# Patient Record
Sex: Female | Born: 1935 | Race: White | Hispanic: No | Smoking: Never smoker
Health system: Southern US, Community
[De-identification: ages and names within clinical notes are randomized; demographics above are authoritative.]

## PROBLEM LIST (undated history)

## (undated) DIAGNOSIS — Z972 Presence of dental prosthetic device (complete) (partial): Secondary | ICD-10-CM

## (undated) DIAGNOSIS — Z853 Personal history of malignant neoplasm of breast: Secondary | ICD-10-CM

## (undated) DIAGNOSIS — Z860101 Personal history of adenomatous and serrated colon polyps: Secondary | ICD-10-CM

## (undated) DIAGNOSIS — N3010 Interstitial cystitis (chronic) without hematuria: Secondary | ICD-10-CM

## (undated) DIAGNOSIS — Z87898 Personal history of other specified conditions: Secondary | ICD-10-CM

## (undated) DIAGNOSIS — I4510 Unspecified right bundle-branch block: Secondary | ICD-10-CM

## (undated) DIAGNOSIS — R011 Cardiac murmur, unspecified: Secondary | ICD-10-CM

## (undated) DIAGNOSIS — M1990 Unspecified osteoarthritis, unspecified site: Secondary | ICD-10-CM

## (undated) DIAGNOSIS — E785 Hyperlipidemia, unspecified: Secondary | ICD-10-CM

## (undated) DIAGNOSIS — Z86010 Personal history of colonic polyps: Secondary | ICD-10-CM

## (undated) DIAGNOSIS — R399 Unspecified symptoms and signs involving the genitourinary system: Secondary | ICD-10-CM

## (undated) DIAGNOSIS — Z973 Presence of spectacles and contact lenses: Secondary | ICD-10-CM

## (undated) DIAGNOSIS — R3989 Other symptoms and signs involving the genitourinary system: Secondary | ICD-10-CM

## (undated) DIAGNOSIS — IMO0001 Reserved for inherently not codable concepts without codable children: Secondary | ICD-10-CM

## (undated) DIAGNOSIS — K5790 Diverticulosis of intestine, part unspecified, without perforation or abscess without bleeding: Secondary | ICD-10-CM

## (undated) DIAGNOSIS — K5909 Other constipation: Secondary | ICD-10-CM

## (undated) DIAGNOSIS — M8580 Other specified disorders of bone density and structure, unspecified site: Secondary | ICD-10-CM

## (undated) HISTORY — DX: Cardiac murmur, unspecified: R01.1

## (undated) HISTORY — PX: OTHER SURGICAL HISTORY: SHX169

## (undated) HISTORY — DX: Other specified disorders of bone density and structure, unspecified site: M85.80

## (undated) HISTORY — PX: TUBAL LIGATION: SHX77

## (undated) HISTORY — PX: VARICOSE VEIN SURGERY: SHX832

## (undated) HISTORY — DX: Unspecified osteoarthritis, unspecified site: M19.90

## (undated) HISTORY — DX: Interstitial cystitis (chronic) without hematuria: N30.10

## (undated) HISTORY — DX: Diverticulosis of intestine, part unspecified, without perforation or abscess without bleeding: K57.90

## (undated) HISTORY — PX: ROTATOR CUFF REPAIR: SHX139

---

## 1958-09-21 HISTORY — PX: OTHER SURGICAL HISTORY: SHX169

## 1968-09-20 HISTORY — PX: VAGINAL HYSTERECTOMY: SUR661

## 1999-11-27 ENCOUNTER — Encounter (INDEPENDENT_AMBULATORY_CARE_PROVIDER_SITE_OTHER): Payer: Self-pay | Admitting: Specialist

## 1999-11-27 ENCOUNTER — Encounter: Admission: RE | Admit: 1999-11-27 | Discharge: 1999-11-27 | Payer: Self-pay | Admitting: Surgery

## 1999-11-27 ENCOUNTER — Encounter: Payer: Self-pay | Admitting: Surgery

## 1999-11-27 ENCOUNTER — Other Ambulatory Visit: Admission: RE | Admit: 1999-11-27 | Discharge: 1999-11-27 | Payer: Self-pay | Admitting: Surgery

## 1999-12-17 ENCOUNTER — Encounter: Admission: RE | Admit: 1999-12-17 | Discharge: 1999-12-17 | Payer: Self-pay | Admitting: Surgery

## 1999-12-17 ENCOUNTER — Encounter: Payer: Self-pay | Admitting: Surgery

## 1999-12-19 ENCOUNTER — Ambulatory Visit (HOSPITAL_BASED_OUTPATIENT_CLINIC_OR_DEPARTMENT_OTHER): Admission: RE | Admit: 1999-12-19 | Discharge: 1999-12-19 | Payer: Self-pay | Admitting: Surgery

## 1999-12-19 ENCOUNTER — Encounter (INDEPENDENT_AMBULATORY_CARE_PROVIDER_SITE_OTHER): Payer: Self-pay | Admitting: *Deleted

## 1999-12-19 ENCOUNTER — Encounter: Payer: Self-pay | Admitting: Surgery

## 1999-12-19 HISTORY — PX: OTHER SURGICAL HISTORY: SHX169

## 1999-12-31 ENCOUNTER — Encounter: Admission: RE | Admit: 1999-12-31 | Discharge: 2000-03-30 | Payer: Self-pay | Admitting: Radiation Oncology

## 2000-06-17 ENCOUNTER — Encounter: Payer: Self-pay | Admitting: *Deleted

## 2000-06-17 ENCOUNTER — Encounter: Admission: RE | Admit: 2000-06-17 | Discharge: 2000-06-17 | Payer: Self-pay | Admitting: *Deleted

## 2000-10-30 ENCOUNTER — Ambulatory Visit (HOSPITAL_COMMUNITY): Admission: RE | Admit: 2000-10-30 | Discharge: 2000-10-30 | Payer: Self-pay | Admitting: Internal Medicine

## 2000-10-30 ENCOUNTER — Encounter: Payer: Self-pay | Admitting: Internal Medicine

## 2000-11-19 ENCOUNTER — Ambulatory Visit (HOSPITAL_COMMUNITY): Admission: RE | Admit: 2000-11-19 | Discharge: 2000-11-19 | Payer: Self-pay | Admitting: Internal Medicine

## 2000-11-19 ENCOUNTER — Encounter: Payer: Self-pay | Admitting: Internal Medicine

## 2001-06-24 ENCOUNTER — Encounter: Payer: Self-pay | Admitting: Gastroenterology

## 2003-11-23 ENCOUNTER — Ambulatory Visit: Payer: Self-pay | Admitting: Gastroenterology

## 2003-12-06 ENCOUNTER — Ambulatory Visit: Payer: Self-pay | Admitting: Gastroenterology

## 2003-12-08 ENCOUNTER — Ambulatory Visit: Payer: Self-pay | Admitting: Internal Medicine

## 2003-12-13 ENCOUNTER — Ambulatory Visit: Payer: Self-pay | Admitting: Internal Medicine

## 2004-05-06 ENCOUNTER — Ambulatory Visit: Payer: Self-pay | Admitting: Family Medicine

## 2004-05-13 ENCOUNTER — Ambulatory Visit: Payer: Self-pay | Admitting: Family Medicine

## 2004-10-07 ENCOUNTER — Encounter: Admission: RE | Admit: 2004-10-07 | Discharge: 2004-10-07 | Payer: Self-pay | Admitting: Orthopaedic Surgery

## 2004-10-22 ENCOUNTER — Encounter: Admission: RE | Admit: 2004-10-22 | Discharge: 2004-10-22 | Payer: Self-pay | Admitting: Orthopaedic Surgery

## 2004-10-25 ENCOUNTER — Ambulatory Visit: Payer: Self-pay

## 2004-11-05 ENCOUNTER — Ambulatory Visit: Payer: Self-pay | Admitting: Family Medicine

## 2005-04-07 ENCOUNTER — Ambulatory Visit: Payer: Self-pay | Admitting: Internal Medicine

## 2005-04-15 ENCOUNTER — Ambulatory Visit: Payer: Self-pay | Admitting: Internal Medicine

## 2005-09-08 ENCOUNTER — Ambulatory Visit: Payer: Self-pay | Admitting: Internal Medicine

## 2005-12-19 ENCOUNTER — Ambulatory Visit: Payer: Self-pay | Admitting: Internal Medicine

## 2006-06-24 ENCOUNTER — Ambulatory Visit: Payer: Self-pay | Admitting: Internal Medicine

## 2006-06-24 DIAGNOSIS — R0602 Shortness of breath: Secondary | ICD-10-CM

## 2006-06-24 DIAGNOSIS — R55 Syncope and collapse: Secondary | ICD-10-CM

## 2006-06-24 DIAGNOSIS — R002 Palpitations: Secondary | ICD-10-CM

## 2006-06-24 DIAGNOSIS — R079 Chest pain, unspecified: Secondary | ICD-10-CM

## 2006-07-06 ENCOUNTER — Ambulatory Visit: Payer: Self-pay | Admitting: Cardiology

## 2006-07-14 ENCOUNTER — Telehealth (INDEPENDENT_AMBULATORY_CARE_PROVIDER_SITE_OTHER): Payer: Self-pay | Admitting: *Deleted

## 2006-07-14 ENCOUNTER — Ambulatory Visit: Payer: Self-pay | Admitting: Internal Medicine

## 2006-07-23 ENCOUNTER — Ambulatory Visit: Payer: Self-pay | Admitting: Cardiology

## 2006-07-23 LAB — CONVERTED CEMR LAB
Cholesterol: 189 mg/dL (ref 0–200)
HDL: 41.9 mg/dL (ref >39.0)
LDL Cholesterol: 130 mg/dL — ABNORMAL HIGH (ref 0–99)
Total CHOL/HDL Ratio: 4.5
Triglycerides: 88 mg/dL (ref 0–149)
VLDL: 18 mg/dL (ref 0–40)

## 2006-12-18 ENCOUNTER — Ambulatory Visit: Payer: Self-pay | Admitting: Internal Medicine

## 2006-12-18 DIAGNOSIS — R300 Dysuria: Secondary | ICD-10-CM | POA: Insufficient documentation

## 2006-12-18 LAB — CONVERTED CEMR LAB
Bilirubin Urine: NEGATIVE
Glucose, Urine, Semiquant: NEGATIVE
Ketones, urine, test strip: NEGATIVE
Nitrite: NEGATIVE
Protein, U semiquant: NEGATIVE
Specific Gravity, Urine: 1.02
Urobilinogen, UA: NEGATIVE
WBC Urine, dipstick: NEGATIVE
pH: 6

## 2007-01-15 ENCOUNTER — Ambulatory Visit: Payer: Self-pay | Admitting: Internal Medicine

## 2007-02-04 ENCOUNTER — Ambulatory Visit: Payer: Self-pay | Admitting: Internal Medicine

## 2007-02-04 ENCOUNTER — Telehealth (INDEPENDENT_AMBULATORY_CARE_PROVIDER_SITE_OTHER): Payer: Self-pay | Admitting: *Deleted

## 2007-02-04 LAB — CONVERTED CEMR LAB
Bilirubin Urine: NEGATIVE
Glucose, Urine, Semiquant: NEGATIVE
Ketones, urine, test strip: NEGATIVE
Nitrite: NEGATIVE
Protein, U semiquant: NEGATIVE
Specific Gravity, Urine: 1.02
Urobilinogen, UA: NEGATIVE
WBC Urine, dipstick: NEGATIVE
pH: 6

## 2007-02-05 ENCOUNTER — Encounter: Payer: Self-pay | Admitting: Internal Medicine

## 2007-02-05 LAB — CONVERTED CEMR LAB: Hgb A1c MFr Bld: 5.6 % (ref 4.6–6.0)

## 2007-03-25 ENCOUNTER — Encounter: Payer: Self-pay | Admitting: Internal Medicine

## 2007-05-21 ENCOUNTER — Telehealth: Payer: Self-pay | Admitting: Internal Medicine

## 2007-08-25 ENCOUNTER — Telehealth (INDEPENDENT_AMBULATORY_CARE_PROVIDER_SITE_OTHER): Payer: Self-pay | Admitting: *Deleted

## 2007-08-26 ENCOUNTER — Ambulatory Visit: Payer: Self-pay | Admitting: Internal Medicine

## 2007-08-26 DIAGNOSIS — E785 Hyperlipidemia, unspecified: Secondary | ICD-10-CM

## 2007-08-26 DIAGNOSIS — R109 Unspecified abdominal pain: Secondary | ICD-10-CM

## 2007-08-26 LAB — CONVERTED CEMR LAB
Bilirubin Urine: NEGATIVE
Blood in Urine, dipstick: NEGATIVE
Glucose, Urine, Semiquant: NEGATIVE
Ketones, urine, test strip: NEGATIVE
Nitrite: NEGATIVE
Protein, U semiquant: NEGATIVE
Specific Gravity, Urine: 1.01
Urobilinogen, UA: 0.2
pH: 7.5

## 2007-08-27 ENCOUNTER — Encounter: Payer: Self-pay | Admitting: Internal Medicine

## 2007-08-30 ENCOUNTER — Encounter (INDEPENDENT_AMBULATORY_CARE_PROVIDER_SITE_OTHER): Payer: Self-pay | Admitting: *Deleted

## 2007-09-04 LAB — CONVERTED CEMR LAB
ALT: 16 units/L (ref 0–35)
AST: 18 units/L (ref 0–37)
Albumin: 4 g/dL (ref 3.5–5.2)
Alkaline Phosphatase: 52 units/L (ref 39–117)
BUN: 14 mg/dL (ref 6–23)
Basophils Absolute: 0 10*3/uL (ref 0.0–0.1)
Basophils Relative: 1.2 % (ref 0.0–3.0)
Bilirubin, Direct: 0.1 mg/dL (ref 0.0–0.3)
Cholesterol: 212 mg/dL (ref 0–200)
Creatinine, Ser: 0.8 mg/dL (ref 0.4–1.2)
Direct LDL: 128.3 mg/dL
Eosinophils Absolute: 0.1 10*3/uL (ref 0.0–0.7)
Eosinophils Relative: 3.2 % (ref 0.0–5.0)
HCT: 38.3 % (ref 36.0–46.0)
HDL: 63.9 mg/dL (ref >39.0)
Hemoglobin: 13 g/dL (ref 12.0–15.0)
Lymphocytes Relative: 48 % — ABNORMAL HIGH (ref 12.0–46.0)
MCHC: 34.1 g/dL (ref 30.0–36.0)
MCV: 95.5 fL (ref 78.0–100.0)
Monocytes Absolute: 0.2 10*3/uL (ref 0.1–1.0)
Monocytes Relative: 6.8 % (ref 3.0–12.0)
Neutro Abs: 1.5 10*3/uL (ref 1.4–7.7)
Neutrophils Relative %: 40.8 % — ABNORMAL LOW (ref 43.0–77.0)
Platelets: 164 10*3/uL (ref 150–400)
Potassium: 4.3 meq/L (ref 3.5–5.1)
RBC: 4.01 M/uL (ref 3.87–5.11)
RDW: 12.4 % (ref 11.5–14.6)
Total Bilirubin: 0.9 mg/dL (ref 0.3–1.2)
Total CHOL/HDL Ratio: 3.3
Total Protein: 6.5 g/dL (ref 6.0–8.3)
Triglycerides: 59 mg/dL (ref 0–149)
VLDL: 12 mg/dL (ref 0–40)
WBC: 3.6 10*3/uL — ABNORMAL LOW (ref 4.5–10.5)

## 2007-09-06 ENCOUNTER — Encounter (INDEPENDENT_AMBULATORY_CARE_PROVIDER_SITE_OTHER): Payer: Self-pay | Admitting: *Deleted

## 2007-09-10 ENCOUNTER — Encounter: Payer: Self-pay | Admitting: Internal Medicine

## 2007-10-14 ENCOUNTER — Encounter: Payer: Self-pay | Admitting: Internal Medicine

## 2007-10-29 ENCOUNTER — Ambulatory Visit: Payer: Self-pay | Admitting: Internal Medicine

## 2007-11-01 ENCOUNTER — Encounter (INDEPENDENT_AMBULATORY_CARE_PROVIDER_SITE_OTHER): Payer: Self-pay | Admitting: *Deleted

## 2007-11-01 LAB — CONVERTED CEMR LAB
BUN: 12 mg/dL (ref 6–23)
Basophils Absolute: 0 10*3/uL (ref 0.0–0.1)
Basophils Relative: 0.3 % (ref 0.0–3.0)
Creatinine, Ser: 0.7 mg/dL (ref 0.4–1.2)
Eosinophils Absolute: 0.1 10*3/uL (ref 0.0–0.7)
Eosinophils Relative: 2.1 % (ref 0.0–5.0)
HCT: 39.2 % (ref 36.0–46.0)
Hemoglobin: 12.9 g/dL (ref 12.0–15.0)
Lymphocytes Relative: 43.6 % (ref 12.0–46.0)
MCHC: 33 g/dL (ref 30.0–36.0)
MCV: 95.6 fL (ref 78.0–100.0)
Monocytes Absolute: 0.2 10*3/uL (ref 0.1–1.0)
Monocytes Relative: 5.5 % (ref 3.0–12.0)
Neutro Abs: 2 10*3/uL (ref 1.4–7.7)
Neutrophils Relative %: 48.5 % (ref 43.0–77.0)
Platelets: 181 10*3/uL (ref 150–400)
RBC: 4.1 M/uL (ref 3.87–5.11)
RDW: 12.8 % (ref 11.5–14.6)
WBC: 4 10*3/uL — ABNORMAL LOW (ref 4.5–10.5)

## 2007-11-03 ENCOUNTER — Ambulatory Visit: Payer: Self-pay | Admitting: Cardiology

## 2007-11-04 ENCOUNTER — Encounter (INDEPENDENT_AMBULATORY_CARE_PROVIDER_SITE_OTHER): Payer: Self-pay | Admitting: *Deleted

## 2007-11-04 ENCOUNTER — Telehealth (INDEPENDENT_AMBULATORY_CARE_PROVIDER_SITE_OTHER): Payer: Self-pay | Admitting: *Deleted

## 2007-11-12 ENCOUNTER — Encounter (INDEPENDENT_AMBULATORY_CARE_PROVIDER_SITE_OTHER): Payer: Self-pay | Admitting: *Deleted

## 2007-11-12 ENCOUNTER — Ambulatory Visit: Payer: Self-pay | Admitting: Internal Medicine

## 2007-11-12 LAB — CONVERTED CEMR LAB
OCCULT 1: NEGATIVE
OCCULT 2: NEGATIVE
OCCULT 3: NEGATIVE

## 2007-12-13 ENCOUNTER — Ambulatory Visit: Payer: Self-pay | Admitting: Gastroenterology

## 2007-12-13 DIAGNOSIS — K5900 Constipation, unspecified: Secondary | ICD-10-CM | POA: Insufficient documentation

## 2007-12-13 DIAGNOSIS — Z86010 Personal history of colonic polyps: Secondary | ICD-10-CM

## 2007-12-22 ENCOUNTER — Encounter: Payer: Self-pay | Admitting: Gastroenterology

## 2007-12-22 ENCOUNTER — Ambulatory Visit: Payer: Self-pay | Admitting: Gastroenterology

## 2007-12-26 ENCOUNTER — Encounter: Payer: Self-pay | Admitting: Gastroenterology

## 2008-03-27 ENCOUNTER — Encounter: Payer: Self-pay | Admitting: Internal Medicine

## 2008-07-17 ENCOUNTER — Telehealth (INDEPENDENT_AMBULATORY_CARE_PROVIDER_SITE_OTHER): Payer: Self-pay | Admitting: *Deleted

## 2008-07-23 ENCOUNTER — Emergency Department (HOSPITAL_COMMUNITY): Admission: EM | Admit: 2008-07-23 | Discharge: 2008-07-24 | Payer: Self-pay | Admitting: Emergency Medicine

## 2008-08-01 ENCOUNTER — Ambulatory Visit: Payer: Self-pay | Admitting: Internal Medicine

## 2008-08-01 DIAGNOSIS — T148XXA Other injury of unspecified body region, initial encounter: Secondary | ICD-10-CM | POA: Insufficient documentation

## 2008-08-18 ENCOUNTER — Telehealth (INDEPENDENT_AMBULATORY_CARE_PROVIDER_SITE_OTHER): Payer: Self-pay | Admitting: *Deleted

## 2008-10-24 ENCOUNTER — Ambulatory Visit: Payer: Self-pay | Admitting: Internal Medicine

## 2009-02-06 ENCOUNTER — Ambulatory Visit (HOSPITAL_BASED_OUTPATIENT_CLINIC_OR_DEPARTMENT_OTHER): Admission: RE | Admit: 2009-02-06 | Discharge: 2009-02-06 | Payer: Self-pay | Admitting: Urology

## 2009-03-30 ENCOUNTER — Encounter: Payer: Self-pay | Admitting: Internal Medicine

## 2009-04-05 ENCOUNTER — Encounter: Payer: Self-pay | Admitting: Internal Medicine

## 2009-08-24 ENCOUNTER — Encounter: Payer: Self-pay | Admitting: Internal Medicine

## 2009-10-09 ENCOUNTER — Ambulatory Visit: Payer: Self-pay | Admitting: Internal Medicine

## 2009-10-09 DIAGNOSIS — M545 Low back pain: Secondary | ICD-10-CM

## 2009-10-09 DIAGNOSIS — M8580 Other specified disorders of bone density and structure, unspecified site: Secondary | ICD-10-CM

## 2009-10-09 DIAGNOSIS — N3010 Interstitial cystitis (chronic) without hematuria: Secondary | ICD-10-CM | POA: Insufficient documentation

## 2009-10-09 LAB — CONVERTED CEMR LAB
Bilirubin Urine: NEGATIVE
Glucose, Urine, Semiquant: NEGATIVE
Ketones, urine, test strip: NEGATIVE
Nitrite: NEGATIVE
Protein, U semiquant: >=300
Specific Gravity, Urine: 1.025
Urobilinogen, UA: 0.2
WBC Urine, dipstick: NEGATIVE
pH: 6

## 2009-10-10 ENCOUNTER — Encounter: Payer: Self-pay | Admitting: Internal Medicine

## 2009-10-10 LAB — CONVERTED CEMR LAB
Basophils Absolute: 0 10*3/uL (ref 0.0–0.1)
Basophils Relative: 1 % (ref 0.0–3.0)
Calcium: 10 mg/dL (ref 8.4–10.5)
Eosinophils Absolute: 0.1 10*3/uL (ref 0.0–0.7)
Eosinophils Relative: 2.3 % (ref 0.0–5.0)
HCT: 38.5 % (ref 36.0–46.0)
Hemoglobin: 13.1 g/dL (ref 12.0–15.0)
Lymphocytes Relative: 35.3 % (ref 12.0–46.0)
Lymphs Abs: 1.4 10*3/uL (ref 0.7–4.0)
MCHC: 34.2 g/dL (ref 30.0–36.0)
MCV: 97.9 fL (ref 78.0–100.0)
Monocytes Absolute: 0.2 10*3/uL (ref 0.1–1.0)
Monocytes Relative: 5.7 % (ref 3.0–12.0)
Neutro Abs: 2.3 10*3/uL (ref 1.4–7.7)
Neutrophils Relative %: 55.7 % (ref 43.0–77.0)
Platelets: 159 10*3/uL (ref 150.0–400.0)
RBC: 3.93 M/uL (ref 3.87–5.11)
RDW: 13.7 % (ref 11.5–14.6)
TSH: 0.88 microintl units/mL (ref 0.35–5.50)
Vit D, 25-Hydroxy: 29 ng/mL — ABNORMAL LOW (ref 30–89)
WBC: 4 10*3/uL — ABNORMAL LOW (ref 4.5–10.5)

## 2009-10-29 ENCOUNTER — Ambulatory Visit: Payer: Self-pay | Admitting: Internal Medicine

## 2009-11-30 ENCOUNTER — Encounter: Payer: Self-pay | Admitting: Internal Medicine

## 2009-12-04 ENCOUNTER — Ambulatory Visit (HOSPITAL_BASED_OUTPATIENT_CLINIC_OR_DEPARTMENT_OTHER): Admission: RE | Admit: 2009-12-04 | Discharge: 2009-12-04 | Payer: Self-pay | Admitting: Urology

## 2009-12-18 ENCOUNTER — Encounter: Payer: Self-pay | Admitting: Internal Medicine

## 2010-01-11 ENCOUNTER — Encounter: Payer: Self-pay | Admitting: Internal Medicine

## 2010-01-15 ENCOUNTER — Encounter: Payer: Self-pay | Admitting: Internal Medicine

## 2010-01-18 ENCOUNTER — Encounter: Payer: Self-pay | Admitting: Internal Medicine

## 2010-01-22 ENCOUNTER — Encounter: Payer: Self-pay | Admitting: Internal Medicine

## 2010-01-29 ENCOUNTER — Encounter: Payer: Self-pay | Admitting: Internal Medicine

## 2010-01-31 ENCOUNTER — Encounter: Payer: Self-pay | Admitting: Internal Medicine

## 2010-02-17 LAB — CONVERTED CEMR LAB
Basophils Absolute: 0 10*3/uL (ref 0.0–0.1)
Basophils Relative: 0.1 % (ref 0.0–1.0)
Calcium: 9.2 mg/dL (ref 8.4–10.5)
Eosinophils Absolute: 0.2 10*3/uL (ref 0.0–0.6)
Eosinophils Relative: 3.8 % (ref 0.0–5.0)
Free T4: 0.8 ng/dL (ref 0.6–1.6)
HCT: 37.5 % (ref 36.0–46.0)
Hemoglobin: 13 g/dL (ref 12.0–15.0)
Lymphocytes Relative: 48.4 % — ABNORMAL HIGH (ref 12.0–46.0)
MCHC: 34.5 g/dL (ref 30.0–36.0)
MCV: 95.3 fL (ref 78.0–100.0)
Magnesium: 2.2 mg/dL (ref 1.5–2.5)
Monocytes Absolute: 0.2 10*3/uL (ref 0.2–0.7)
Monocytes Relative: 6 % (ref 3.0–11.0)
Neutro Abs: 1.7 10*3/uL (ref 1.4–7.7)
Neutrophils Relative %: 41.7 % — ABNORMAL LOW (ref 43.0–77.0)
Platelets: 175 10*3/uL (ref 150–400)
Potassium: 3.8 meq/L (ref 3.5–5.1)
RBC: 3.94 M/uL (ref 3.87–5.11)
RDW: 12.2 % (ref 11.5–14.6)
T3, Free: 2.9 pg/mL (ref 2.3–4.2)
TSH: 1.01 microintl units/mL (ref 0.35–5.50)
WBC: 4.1 10*3/uL — ABNORMAL LOW (ref 4.5–10.5)

## 2010-02-19 NOTE — Miscellaneous (Signed)
Summary: BONE DENSITY  Clinical Lists Changes  Orders: Added new Test order of T-Bone Densitometry (77080) - Signed Added new Test order of T-Lumbar Vertebral Assessment (77082) - Signed 

## 2010-02-19 NOTE — Letter (Signed)
Summary: [facility]   Imported By: Lanelle Bal 05/02/2009 08:59:23  _____________________________________________________________________  External Attachment:    Type:   Image     Comment:   External Document

## 2010-02-19 NOTE — Assessment & Plan Note (Signed)
Summary: problem with kidney/cbs   Vital Signs:  Patient profile:   77 year old female Weight:      133.4 pounds BMI:     20.97 Temp:     98.1 degrees F oral Pulse rate:   60 / minute Resp:     17 per minute BP sitting:   108 / 70  (left arm) Cuff size:   large  Vitals Entered By: Shonna Chock CMA (October 09, 2009 9:45 AM) CC: Lower back pain x 2 weeks, Back pain   Primary Care Provider:  Marga Melnick MD  CC:  Lower back pain x 2 weeks and Back pain.  History of Present Illness: Back Pain      This is an 77 year old woman who presents with Back pain X 1 week.  The patient reports urinary retention and dysuria ( she has seen Dr McDiarmid; records not available  )She  denies fever, chills, weakness, loss of sensation, fecal incontinence, and urinary incontinence.  The pain is located in the mid low back.  The pain began at home, suddenly,  w/o injury or overuse.  The pain  does not radiate.  The pain has no exacerbating factors.The pain is made better by opioids Rxed by Urology for bladder pain from Interstial Cystitis.  Risk factors for serious underlying conditions include age >= 50 years. Colon polyp 2009; repeat due 2012. Last BMD 05/2007.  Allergies: 1)  ! Lescol 2)  ! Zocor 3)  ! Vicodin 4)  ! * Hormone  Past History:  Past Medical History: Breast cancer, hx of Hyperlipidemia Osteoporosis dysuria due to Interstial Cystitis palpitations syncope Diverticulosis Hemorrhoids Villous adenomoatous colon polyps,  06/2001  Review of Systems General:  Denies sweats; Weight down 20 # since GU symptoms began. GI:  Denies abdominal pain, bloody stools, and dark tarry stools. GU:  Denies abnormal vaginal bleeding, discharge, and hematuria. Derm:  Denies lesion(s) and rash.  Physical Exam  General:  Appears younger than age,in no acute distress; alert,appropriate and cooperative throughout examination Abdomen:  Bowel sounds positive,abdomen soft  but slightly tender   over bladder without masses, organomegaly or hernias noted. Aorta palpable w/o AAA Msk:  No pain to percussion. Lay down & sat up w/o help. Neg SLR Extremities:  No clubbing, cyanosis, edema. Minor OA hand changes Neurologic:  alert & oriented X3, strength normal in all extremities, gait (heel/toe)normal, and DTRs symmetrical and normal.   Skin:  Intact without suspicious lesions or rashes Cervical Nodes:  No lymphadenopathy noted Axillary Nodes:  No palpable lymphadenopathy Psych:  memory intact for recent and remote, normally interactive, and good eye contact.     Impression & Recommendations:  Problem # 1:  LOW BACK PAIN, ACUTE (ICD-724.2)  The following medications were removed from the medication list:    Bayer Low Strength 81 Mg Tbec (Aspirin) .Marland Kitchen... 1 by mouth once daily Her updated medication list for this problem includes:    Tylenol Arthritis Pain 650 Mg Cr-tabs (Acetaminophen) .Marland Kitchen... Prn    Percocet 5-325 Mg Tabs (Oxycodone-acetaminophen) .Marland Kitchen... 1 by mouth every 4-6 hours as needed    Cyclobenzaprine Hcl 5 Mg Tabs (Cyclobenzaprine hcl) .Marland Kitchen... 1 at bedtime as needed back pain  Orders: TLB-CBC Platelet - w/Differential (85025-CBCD) Prescription Created Electronically [phone])  Problem # 2:  OSTEOPOROSIS (ICD-733.00)  Orders: Radiology Referral (Radiology) Venipuncture [phone]) T-Vitamin D (25-Hydroxy) [phone]) TLB-TSH (Thyroid Stimulating Hormone) (84443-TSH) TLB-CBC Platelet - w/Differential (85025-CBCD) TLB-Calcium (82310-CA)  Problem # 3:  INTERSTITIAL CYSTITIS (ICD-595.1)  Problem # 4:  DYSURIA (ICD-788.1)  due to #1  Orders: TLB-CBC Platelet - w/Differential (85025-CBCD)  Complete Medication List: 1)  Gabapentin 100 Mg Caps (Gabapentin) .Marland Kitchen.. 1 q 8 hrs as needed pain 2)  Tylenol Arthritis Pain 650 Mg Cr-tabs (Acetaminophen) .... Prn 3)  Percocet 5-325 Mg Tabs (Oxycodone-acetaminophen) .Marland Kitchen.. 1 by mouth every 4-6 hours as needed 4)  Cyclobenzaprine Hcl 5 Mg  Tabs (Cyclobenzaprine hcl) .Marland Kitchen.. 1 at bedtime as needed back pain  Other Orders: UA Dipstick w/o Micro (manual) (11914) Specimen Handling (99000) T-Culture, Urine ([id number])  Patient Instructions: 1)  No lifting > 10 # until back pain has resolved. Prescriptions: GABAPENTIN 100 MG CAPS (GABAPENTIN) 1 q 8 hrs as needed pain  #30 x 5   Entered and Authorized by:   Marga Melnick MD   Signed by:   Marga Melnick MD on 10/09/2009   Method used:   Faxed to ...       [facility]  [address] [phone]* (retail)       [address]       [place], [place]  [address]       Ph: [phone] or [phone]       Fax: [phone]   RxID:   [id number] CYCLOBENZAPRINE HCL 5 MG TABS (CYCLOBENZAPRINE HCL) 1 at bedtime as needed back pain  #15 x 0   Entered and Authorized by:   Marga Melnick MD   Signed by:   Marga Melnick MD on 10/09/2009   Method used:   [facility]ed to ...       [facility] [facility] [address] [phone]* (retail)       [address]       [place], [place]  [address]       Ph: [phone] or [phone]       Fax: [phone]   RxID:   [id number]   Laboratory Results   Urine Tests    Routine Urinalysis   Color: straw Appearance: Clear Glucose: negative   (Normal Range: Negative) Bilirubin: negative   (Normal Range: Negative) Ketone: negative   (Normal Range: Negative) Spec. Gravity: 1.025   (Normal Range: 1.003-1.035) Blood: moderate   (Normal Range: Negative) pH: 6.0   (Normal Range: 5.0-8.0) Protein: >=300   (Normal Range: Negative) Urobilinogen: 0.2   (Normal Range: 0-1) Nitrite: negative   (Normal Range: Negative) Leukocyte Esterace: negative   (Normal Range: Negative)    Comments: sent for culture     Appended Document: problem with kidney/cbs   Appended Document: problem with kidney/cbs Flu Vaccine Consent Questions     Do you have a history of severe allergic reactions to this vaccine? no    Any  prior history of allergic reactions to egg and/or gelatin? no    Do you have a sensitivity to the preservative Thimersol? no    Do you have a past history of Guillan-Barre Syndrome? no    Do you currently have an acute febrile illness? no    Have you ever had a severe reaction to latex? no    Vaccine information given and explained to patient? yes    Are you currently pregnant? no    Lot Number:[id number]   Exp Date:07/20/2010   Site Given  Left Deltoid IM

## 2010-02-21 NOTE — Letter (Signed)
[facility]: [facility]   Imported By: Lanelle Bal 01/28/2010 13:14:30  _____________________________________________________________________  External Attachment:    Type:   Image     Comment:   External Document

## 2010-02-21 NOTE — Letter (Signed)
Summary: [facility]   Imported By: Lanelle Bal 01/29/2010 13:56:11  _____________________________________________________________________  External Attachment:    Type:   Image     Comment:   External Document

## 2010-02-21 NOTE — Letter (Signed)
Summary: [facility]  [facility]   Imported By: Lanelle Bal 01/28/2010 13:13:50  _____________________________________________________________________  External Attachment:    Type:   Image     Comment:   External Document

## 2010-02-21 NOTE — Letter (Signed)
Summary: [facility]   Imported By: Lanelle Bal 02/07/2010 11:35:03  _____________________________________________________________________  External Attachment:    Type:   Image     Comment:   External Document

## 2010-02-21 NOTE — Letter (Signed)
Summary: [facility]  [facility]   Imported By: Lanelle Bal 01/28/2010 13:16:06  _____________________________________________________________________  External Attachment:    Type:   Image     Comment:   External Document

## 2010-02-21 NOTE — Letter (Signed)
Summary: [facility]   Imported By: Lanelle Bal 01/28/2010 13:17:00  _____________________________________________________________________  External Attachment:    Type:   Image     Comment:   External Document

## 2010-02-21 NOTE — Letter (Signed)
Summary: [facility]   Imported By: Lanelle Bal 01/28/2010 13:15:20  _____________________________________________________________________  External Attachment:    Type:   Image     Comment:   External Document

## 2010-02-21 NOTE — Letter (Signed)
Summary: [facility]   Imported By: Lanelle Bal 02/01/2010 12:28:37  _____________________________________________________________________  External Attachment:    Type:   Image     Comment:   External Document

## 2010-02-22 ENCOUNTER — Encounter: Payer: Self-pay | Admitting: Internal Medicine

## 2010-02-27 NOTE — Letter (Signed)
Summary: [facility]   Imported By: Lanelle Bal 02/18/2010 08:54:41  _____________________________________________________________________  External Attachment:    Type:   Image     Comment:   External Document

## 2010-03-07 NOTE — Letter (Signed)
Summary: [facility]  [facility]   Imported By: Maryln Gottron 03/01/2010 09:40:19  _____________________________________________________________________  External Attachment:    Type:   Image     Comment:   External Document

## 2010-03-15 ENCOUNTER — Encounter: Payer: Self-pay | Admitting: Internal Medicine

## 2010-03-28 NOTE — Letter (Signed)
Summary: [facility]  [facility]   Imported By: Maryln Gottron 03/22/2010 09:43:15  _____________________________________________________________________  External Attachment:    Type:   Image     Comment:   External Document

## 2010-04-02 LAB — POCT HEMOGLOBIN-HEMACUE: Hemoglobin: 12.2 g/dL (ref 12.0–15.0)

## 2010-04-08 LAB — POCT HEMOGLOBIN-HEMACUE: Hemoglobin: 13.9 g/dL (ref 12.0–15.0)

## 2010-04-12 ENCOUNTER — Encounter: Payer: Self-pay | Admitting: Internal Medicine

## 2010-06-04 NOTE — Assessment & Plan Note (Signed)
[facility]                            CARDIOLOGY OFFICE NOTE   Evans, Olivia                      MRN:          [id number]  DATE:07/06/2006                            DOB:          [birth year]    REASON FOR REFERRAL:  Evaluate patient with palpitations and weakness.   HISTORY OF PRESENT ILLNESS:  The patient is a lovely 77 year old white  female with past cardiac workup that included an event monitor in 2006.  This demonstrated PVCs and PACs.  This was done preoperatively.  She has  not been particularly bothered by these palpitations since that time.  She did have a stress test in 2000, demonstrating no ischemia or  infarct.   She gets along very well.  She lives by herself and does all of her  chores.  She works in [employer].  She can be up on her feet for  hours at a time.  With her usual activity she denies any chest  discomfort, neck discomfort, arm discomfort, activity induced nausea,  vomiting, excessive diaphoresis.   In early June she felt very weak one Saturday.  She was having quite a  few palpitations.  She was describing some sustained runs that might  last for several seconds at a time.  She was weak throughout that day.  She did not have any presyncope or syncope and no other associated  symptoms.  By the next day this had resolved and she has since felt her  usual self.  Currently she denies any symptoms as described above and  has been back to her usual activity.  She did get labs that included  normal electrolytes and thyroid.  There was 24 hour Holter ordered, but  I do not have those results.  We will have to clarify with the patient  whether this was actually done.   PAST MEDICAL HISTORY:  1. Mild hyperlipidemia.  2. Breast cancer.   PAST SURGICAL HISTORY:  1. Varicose veins stripped.  2. Chest tumor resected in the 1960s.  3. Tubal ligation.  4. Partial hysterectomy.  5. Lumpectomy on the right.  6.  Rotator cuff surgery.   ALLERGIES:  __________ , PREMARIN, SIMVASTATIN, ACTONEL, GABAPENTIN.   MEDICATIONS:  1. Boniva.  2. Vitamin D.  3. Vear Clock' Caplets.  4. Fish oil.  5. Calcium.  6. Aspirin 81 mg daily.  7. Tylenol.  8. Darvocet.   SOCIAL HISTORY:  The patient is a widow, her husband was my patient.  She is retired.  She has 4 children.  She has never smoked and does not  drink alcohol.   FAMILY HISTORY:  Noncontributory for early coronary disease, though her  father died at 29 with multiple medical problems.   REVIEW OF SYSTEMS:  As stated in the HPI and positive for occasional leg  cramps and joint pains, positive for cough.  Negative for all other  systems.   PHYSICAL EXAMINATION:  The patient is in no distress.  Blood pressure  151/72, heart rate 65 and regular, weight 151 pounds, body mass index  25.  HEENT:  Eyelids unremarkable, pupils equally round and reactive to  light, fundi within normal limits, oral mucosa unremarkable.  NECK:  No jugular venous distention at 45 degrees, carotid upstroke  brisk and symmetrical, no bruits, no thyromegaly.  LYMPHATICS:  No cervical, axillary, inguinal adenopathy.  LUNGS:  Clear to auscultation bilaterally.  BACK:  No costovertebral angle tenderness.  CHEST:  Unremarkable.  HEART:  PMI not displaced or sustained, S1 and S2 within normal limits,  no S3, no S4, no clicks, no rubs, no murmurs.  ABDOMEN:  Flat, positive bowel sounds normal in frequency and pitch, no  bruits, no rebound, no guarding, no midline pulsatile mass, no  hepatomegaly, splenomegaly.  SKIN:  No rashes, no nodules.  EXTREMITIES:  With 2+ pulses throughout, no edema, no cyanosis, no  clubbing.  NEURO:  Oriented to person, place, and time; cranial nerves II-XII  grossly intact, motor grossly intact.   EKG sinus rhythm, rate 69, leftward axis, RSR prime V1 and V2, poor  anterior R wave progression, no acute ST wave changes.   ASSESSMENT/PLAN:  1.  Weakness and palpitations.  The patient had this earlier this month      but has had none in the last 2 weeks.  At this point I will look      for any further testing, clarify with the patient whether she had a      Holter placed.  If not, I would not order this as she is currently      not having symptoms.  The etiology of this is vague, though she has      had premature atrial contractions and premature ventricular      contractions in the past and may have simply been having paroxysms      of this.  I do not think any further cardiovascular testing is      warranted at this point, however, she will let me know if she has      any increasing symptoms or certainly if she has any near syncope or      syncopal spells.  2. Hypertension.  Blood pressure is slightly elevated.  It was at her      last visit with Dr. Alwyn Ren.  I have instructed her to get a blood      pressure cuff and keep a diary.  She can review this with Dr.      Alwyn Ren.  3. Dyslipidemia.  The patient is treating this with diet control and      we have reviewed this.  She      can follow with Dr. Alwyn Ren.  4. Followup will be as needed.     Rollene Rotunda, MD, [facility]  Electronically Signed    JH/MedQ  DD: 07/06/2006  DT: 07/07/2006  Job #: [id number]   cc:   Titus Dubin. Alwyn Ren, MD,FACP,FCCP

## 2010-06-07 NOTE — Op Note (Signed)
[place]  Patient:    Olivia Evans, Olivia Evans                        MRN: [id number] Proc. Date: 12/19/99 Adm. Date:  [id number] Attending:  Charlton Haws CC:         Titus Dubin. Alwyn Ren, M.D. [facility]   Operative Report  CCS [phone]  PREOPERATIVE DIAGNOSIS:  Carcinoma right breast, lower/outer quadrant.  POSTOPERATIVE DIAGNOSIS:  Carcinoma right breast, lower/outer quadrant.  OPERATION:  Needle-guided right partial mastectomy with sentinel node dissection.  SURGEON:  Currie Paris, M.D.  ANESTHESIA:  General.  CLINICAL HISTORY:  [name] is a 77 year old lady, who had a subtle area noted in her left breast on mammography.  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  She was scheduled for a needle-guided excision with sentinel node dissection.  DESCRIPTION OF PROCEDURE:  Patient had guidewires placed, but the lesion was barely visible and the guidewire placement was technically difficult.  Three guidewires were placed with a ______  wire being the closest to the lesion and lesion appearing to be just posterior to this.  She was also injected with radioactive isotope for sentinel node.  Patient brought to the operating room and after satisfactory general anesthesia had been obtained, the left breast was prepped and draped as a sterile field.  The three guidewires entered almost in the same entry point and tracked superiorly and laterally.  I made a curvilinear incision in the lower/outer quadrant of the breast starting at the guidewire site and extending superiorly and laterally basically tracking over the guidewire.  I raised skin flaps medially towards the nipple and laterally towards the edge of the breast.  I then divided the breast tissue just inferior to the guidewires so that I got down to the chest wall.  I then elevated the breast and subcutaneous tissue and up off of the chest wall going  superiorly until I thought I was beyond the tip of the ______  wire.  I went both medially towards the nipple and laterally a wide distance around the guidewire so that I had a wide excision around the guidewires.  This was sent for specimen mammography.  It was primarily done with the cautery.  Bleeders were electrocoagulated.  I injected some Marcaine to help with postoperative analgesia and put four clips to mark the limits of the excision.  Attention was then turned to the axilla.  I used the neoprobe to identify a hot area somewhat low axilla, fairly anteriorly.  I made a transverse incision and divided through the subcutaneous tissues.  A self-retaining retractor was placed and a little more dissection and using the neoprobe, I could palpate a small node and was able to grasp that with the Babcock and excise it.  It was hot by the neoprobe and was sent for touch preps.  Additional probing with the neoprobe showed a second node a little bit higher up and this was also removed.  It was very small, may be about 4 mm and also was hot.  With this removal, there was no other hot areas noted in the axilla.  The wound was checked for hemostasis, injected with Marcaine and closed with 3-0 Vicryl followed by 4-0 Monocryl subcuticular.  Attention was returned to the breast itself and the specimen radiograph did not clearly show a lesion, although at 7 mm and with a lot of it having been removed  by the core, was not clear whether anything was going to show up.  I was nevertheless concerned that we did not have the lesion, so I went ahead and put two Allis clamps on the superior edge of the remaining breast tissue and excised again a full-thickness area of breast tissue going about 3 cm further up the breast and again going down to chest wall since the lesion was fairly close the chest wall.  With this excised, I was then able to palpate a small nodule, which was not readily noticeable prior to  excising this tissue and it abutted up against the area that I had previously excised.  I put a suture through this to mark for the pathologist and sent it off to pathology as well.  The wound was irrigated, checked for hemostasis and a few marking clips to mark out the new superior border were placed.  I palpated the area carefully and could find no other lesions and then closed.  The breast did not appear to close with deep sutures because I thought that would perform a significant deformity, so I simply closed the skin with staples.  The pathologist reported that the sentinel nodes were negative by touch preps and I elected at this point to terminate the procedure.  Sterile dressings were applied.  The patient tolerated the procedure well.  There were no operative complications and all counts were correct. DD:  12/19/99 TD:  12/19/99 Job: [id number]

## 2010-07-19 ENCOUNTER — Encounter: Payer: Self-pay | Admitting: Cardiovascular Disease

## 2010-07-19 ENCOUNTER — Encounter (INDEPENDENT_AMBULATORY_CARE_PROVIDER_SITE_OTHER): Payer: Self-pay | Admitting: *Deleted

## 2010-08-20 ENCOUNTER — Encounter (INDEPENDENT_AMBULATORY_CARE_PROVIDER_SITE_OTHER): Payer: Self-pay | Admitting: Surgery

## 2010-10-01 ENCOUNTER — Ambulatory Visit (HOSPITAL_BASED_OUTPATIENT_CLINIC_OR_DEPARTMENT_OTHER)
Admission: RE | Admit: 2010-10-01 | Discharge: 2010-10-01 | Disposition: A | Discharge status: Home / Self Care | Payer: Medicare Other | Source: Ambulatory Visit | Attending: Urology | Admitting: Urology

## 2010-10-01 ENCOUNTER — Other Ambulatory Visit: Payer: Self-pay | Admitting: Urology

## 2010-10-01 DIAGNOSIS — Z01812 Encounter for preprocedural laboratory examination: Secondary | ICD-10-CM | POA: Insufficient documentation

## 2010-10-01 DIAGNOSIS — N3010 Interstitial cystitis (chronic) without hematuria: Secondary | ICD-10-CM | POA: Insufficient documentation

## 2010-10-01 HISTORY — PX: OTHER SURGICAL HISTORY: SHX169

## 2010-10-01 LAB — POCT HEMOGLOBIN-HEMACUE: Hemoglobin: 13.3 g/dL (ref 12.0–15.0)

## 2010-10-15 NOTE — Op Note (Signed)
  NAMESAORY, Olivia Evans               ACCOUNT NO.:  [id number]  MEDICAL RECORD NO.:  [id number]  LOCATION:                                 FACILITY:  PHYSICIAN:  Martina Sinner, MD DATE OF BIRTH:  [birth year]  DATE OF PROCEDURE:  10/01/2010 DATE OF DISCHARGE:                              OPERATIVE REPORT   PREOPERATIVE DIAGNOSIS:  Interstitial cystitis.  POSTOPERATIVE DIAGNOSIS:  Interstitial cystitis plus Hunner ulcers.  SURGERY:  Cystoscopy, bladder hydrodistention, bladder biopsy, bladder fulguration of Hunner ulcers.  The patient was prepped and draped in usual fashion.  I was going to do hydrodistention, hopefully therapeutic since she has ongoing pain.  She was prepped and draped in usual fashion.  On inspection of her bladder, she had bladder scarring grade 1/4 bladder trabeculation.  As soon as I started to  distend the bladder, she had glomerulations and some mild erythema.  I hydrodistended 275 mL for approximately 4 or 5 minutes.  It was obvious she had some Hunner ulcers.  The mucosa was split superficially causing the ulcer.  She had a lot of oozing.  There was no bladder perforation or injury.  After the hydrodistention, I took 2 superficial biopsies at approximately 7 o'clock in the floor of the bladder away from the ureteral orifice.  I biopsied the erythematous areas in question.  I fulgurated the biopsy sites, especially the margins.  There was no bladder perforation and the biopsies were not deep.  I then spent approximately 15 minutes fulgurating what I felt was a Hunner ulcer at 5 o'clock, 11 o'clock at the dome and the left lateral wall.  She had so much involvement.  I could have fulgurated more, but I had it to define an endpoint.  Gentle irrigation was utilized.  Biopsy sites were rechecked at the end of the case.  There was no bladder injury.  Bladder was emptied and the patient was taken to recovery room.  I did not instill any Marcaine.          ______________________________ Martina Sinner, MD     SAM/MEDQ  D:  10/01/2010  T:  10/01/2010  Job:  [id number]  Electronically Signed by Alfredo Martinez MD on 10/15/2010 09:48:40 AM

## 2011-01-21 DIAGNOSIS — K5790 Diverticulosis of intestine, part unspecified, without perforation or abscess without bleeding: Secondary | ICD-10-CM

## 2011-01-21 HISTORY — DX: Diverticulosis of intestine, part unspecified, without perforation or abscess without bleeding: K57.90

## 2011-01-27 ENCOUNTER — Ambulatory Visit (INDEPENDENT_AMBULATORY_CARE_PROVIDER_SITE_OTHER): Payer: Medicare Other

## 2011-01-27 DIAGNOSIS — Z23 Encounter for immunization: Secondary | ICD-10-CM

## 2011-02-10 ENCOUNTER — Encounter: Payer: Self-pay | Admitting: Gastroenterology

## 2011-03-26 ENCOUNTER — Encounter: Payer: Self-pay | Admitting: Gastroenterology

## 2011-03-26 ENCOUNTER — Telehealth: Payer: Self-pay | Admitting: Gastroenterology

## 2011-03-26 NOTE — Telephone Encounter (Signed)
Patient thought she was scheduled for April 26 she will keep the appt for 04/15/11.  Patient is scheduled for on office visit for 04/15/11 with Dr Russella Dar, she is enrolled in a study for interstitial cystitis in April and needs her colon prior to the start of the study.  I have scheduled her for 04/22/11 for a screening colon she will get her instructions at the office visit

## 2011-04-01 ENCOUNTER — Ambulatory Visit (INDEPENDENT_AMBULATORY_CARE_PROVIDER_SITE_OTHER): Payer: Medicare Other | Admitting: Internal Medicine

## 2011-04-01 ENCOUNTER — Encounter: Payer: Self-pay | Admitting: Internal Medicine

## 2011-04-01 VITALS — BP 124/78 | HR 74 | Temp 98.0°F | Resp 12 | Ht 66.0 in | Wt 128.0 lb

## 2011-04-01 DIAGNOSIS — M810 Age-related osteoporosis without current pathological fracture: Secondary | ICD-10-CM

## 2011-04-01 DIAGNOSIS — Z860100 Personal history of colon polyps, unspecified: Secondary | ICD-10-CM

## 2011-04-01 DIAGNOSIS — Z23 Encounter for immunization: Secondary | ICD-10-CM

## 2011-04-01 DIAGNOSIS — E785 Hyperlipidemia, unspecified: Secondary | ICD-10-CM

## 2011-04-01 DIAGNOSIS — Z0000 Encounter for general adult medical examination without abnormal findings: Secondary | ICD-10-CM

## 2011-04-01 LAB — BASIC METABOLIC PANEL
CO2: 31 mEq/L (ref 19–32)
Chloride: 101 mEq/L (ref 96–112)
GFR: 76.07 mL/min (ref >60.00)
Glucose, Bld: 90 mg/dL (ref 70–99)
Potassium: 3.8 mEq/L (ref 3.5–5.1)
Sodium: 138 mEq/L (ref 135–145)

## 2011-04-01 LAB — CBC WITH DIFFERENTIAL/PLATELET
Eosinophils Relative: 1.8 % (ref 0.0–5.0)
Lymphocytes Relative: 37.2 % (ref 12.0–46.0)
Monocytes Absolute: 0.2 10*3/uL (ref 0.1–1.0)
Monocytes Relative: 4.7 % (ref 3.0–12.0)
Neutrophils Relative %: 55.5 % (ref 43.0–77.0)
Platelets: 169 10*3/uL (ref 150.0–400.0)
RBC: 4.03 Mil/uL (ref 3.87–5.11)
WBC: 3.7 10*3/uL — ABNORMAL LOW (ref 4.5–10.5)

## 2011-04-01 LAB — HEPATIC FUNCTION PANEL
ALT: 16 U/L (ref 0–35)
AST: 20 U/L (ref 0–37)
Albumin: 4.2 g/dL (ref 3.5–5.2)
Alkaline Phosphatase: 57 U/L (ref 39–117)

## 2011-04-01 LAB — LIPID PANEL: VLDL: 15.8 mg/dL (ref 0.0–40.0)

## 2011-04-01 LAB — LDL CHOLESTEROL, DIRECT: Direct LDL: 104.5 mg/dL

## 2011-04-01 NOTE — Progress Notes (Signed)
Subjective:    Patient ID: Olivia Evans, female    DOB: 15-Nov-1926, 78 y.o.   MRN: [id number]  HPI Medicare Wellness Visit:  The following psychosocial & medical history were reviewed as required by Medicare.   Social history: caffeine: 1 cup coffee , alcohol:  none ,  tobacco use : never  & exercise : on occasion.   Home & personal  safety / fall risk: no issues, activities of daily living: no limitations , seatbelt use : yes , and smoke alarm employment : yes .  Power of Attorney/Living Will status : in place   Vision ( as recorded per Nurse) & Hearing  evaluation : see exam Orientation :orientation X3 , memory & recall :good,  math testing: good,and mood & affect : normal. Depression / anxiety: denied Travel history : [place] cruise , immunization status :Shingles & PNA vaccines needed , transfusion history: no, and preventive health surveillance ( colonoscopies, BMD , etc as per protocol/ [facility]): recalled for colonoscopy, Dental care:  As needed . Chart reviewed &  Updated. Active issues reviewed & addressed.       Review of Systems She is not on any medications for her dyslipidemia. She has received a recall notice as noted  for colonoscopy. She denies been explained weight loss, rectal bleeding, or frank melena. With the constipation she's noted some change in color of stool, specifically a darkening.  She is being actively followed by the urologist for her interstitial cystitis but is only  on pain medication at this time.     Objective:   Physical Exam Gen.: Healthy and well-nourished in appearance. Alert, appropriate and cooperative throughout exam.Appears younger than stated age  Head: Normocephalic without obvious abnormalities  Eyes: No corneal or conjunctival inflammation noted.  Extraocular motion intact. Vision grossly normal with lenses. Ears: External  ear exam reveals no significant lesions or deformities. Canals clear .TMs normal. Hearing is grossly normal  bilaterally. Nose: External nasal exam reveals no deformity or inflammation. Nasal mucosa are pink and moist. No lesions or exudates noted. Septum to L Mouth: Oral mucosa and oropharynx reveal no lesions or exudates. Teeth in good repair. Neck: No deformities, masses, or tenderness noted. Range of motion normal. Thyroid :grainy ; L > R lobe w/o enlargement Lungs: Normal respiratory effort; chest expands symmetrically. Lungs are clear to auscultation without rales, wheezes, or increased work of breathing. Heart: Normal rate and rhythm. Normal S1 and S2. No gallop, click, or rub. No murmur. Abdomen: Bowel sounds normal; abdomen soft and nontender. No masses, organomegaly or hernias noted. Genitalia: Dr Jennette Kettle  .                                                                                   Musculoskeletal/extremities: No deformity or scoliosis noted of  the thoracic or lumbar spine. No clubbing, cyanosis, edema, or deformity noted. Range of motion  excellent .Tone & strength  normal.Joints normal. Nail health  good. Vascular: Carotid, radial artery, dorsalis pedis and  posterior tibial pulses are full and equal. No bruits present. Neurologic: Alert and oriented x3. Deep tendon reflexes symmetrical and normal.  Skin: Intact without suspicious lesions or rashes. Lymph: No cervical, axillary lymphadenopathy present. Psych: Mood and affect are normal. Normally interactive                                                                                        Assessment & Plan:  #1 Medicare Wellness Exam; criteria met ; data entered #2 Problem List reviewed ; Assessment/ Recommendations made Plan: see Orders

## 2011-04-01 NOTE — Patient Instructions (Addendum)
Preventive Health Care: Exercise  30-45  minutes a day, 3-4 days a week. Walking is especially valuable in preventing Osteoporosis. Eat a low-fat diet with lots of fruits and vegetables, up to 7-9 servings per day.Consume less than 30 grams of sugar per day from foods & drinks with High Fructose Corn Syrup as # 1,2,3 or #4 on label.  

## 2011-04-02 LAB — VITAMIN D 25 HYDROXY (VIT D DEFICIENCY, FRACTURES): Vit D, 25-Hydroxy: 18 ng/mL — ABNORMAL LOW (ref 30–89)

## 2011-04-15 ENCOUNTER — Ambulatory Visit (INDEPENDENT_AMBULATORY_CARE_PROVIDER_SITE_OTHER): Payer: Medicare Other | Admitting: Gastroenterology

## 2011-04-15 ENCOUNTER — Ambulatory Visit: Payer: Medicare Other | Admitting: Gastroenterology

## 2011-04-15 ENCOUNTER — Encounter: Payer: Self-pay | Admitting: Gastroenterology

## 2011-04-15 VITALS — BP 128/60 | HR 76 | Ht 66.0 in | Wt 132.8 lb

## 2011-04-15 DIAGNOSIS — K5900 Constipation, unspecified: Secondary | ICD-10-CM

## 2011-04-15 DIAGNOSIS — Z86010 Personal history of colonic polyps: Secondary | ICD-10-CM

## 2011-04-15 MED ORDER — MOVIPREP 100 G PO SOLR: 1.0000 | Freq: Once | ORAL | Status: DC

## 2011-04-15 NOTE — Patient Instructions (Signed)
You have been scheduled for a colonoscopy. Please follow written instructions given to you at your visit today.  Please pick up your prep kit at the pharmacy within the next 1-3 days. cc: Marga Melnick, MD

## 2011-04-15 NOTE — Progress Notes (Addendum)
History of Present Illness: This is an  78 year old female with a history of villous adenoma removed in 2003. Last colonoscopy was in November 2009 with no recurrent polyps. Her colon was tortuous, moderately difficult procedure. She has chronic constipation that is fairly well-controlled with a combination of MiraLax, milk of magnesia and Senokot as needed. She has problems with intestinal gas. Denies weight loss, abdominal pain, diarrhea, change in stool caliber, melena, hematochezia, nausea, vomiting, dysphagia, reflux symptoms, chest pain.  Review of Systems: Pertinent positive and negative review of systems were noted in the above HPI section. All other review of systems were otherwise negative.  Current Medications, Allergies, Past Medical History, Past Surgical History, Family History and Social History were reviewed in [employer] record.  Physical Exam: General: Well developed , well nourished, no acute distress Head: Normocephalic and atraumatic Eyes:  sclerae anicteric, EOMI Ears: Normal auditory acuity Mouth: No deformity or lesions Neck: Supple, no masses or thyromegaly Lungs: Clear throughout to auscultation Heart: Regular rate and rhythm; no murmurs, rubs or bruits Abdomen: Soft, non tender and non distended. No masses, hepatosplenomegaly or hernias noted. Normal Bowel sounds Rectal: Deferred to colonoscopy Musculoskeletal: Symmetrical with no gross deformities  Skin: No lesions on visible extremities Pulses:  Normal pulses noted Extremities: No clubbing, cyanosis, edema or deformities noted Neurological: Alert oriented x 4, grossly nonfocal Cervical Nodes:  No significant cervical adenopathy Inguinal Nodes: No significant inguinal adenopathy Psychological:  Alert and cooperative. Normal mood and affect  Assessment and Recommendations:  1. History of villous adenomatous colon polyp. Given that her health status is very good it is reasonable to  continue surveillance colonoscopies and she would like to proceed with another colonoscopy. The risks, benefits, and alternatives to colonoscopy with possible biopsy and possible polypectomy were discussed with the patient and they consent to proceed.   2. Chronic constipation with intestinal gas. I advised her to take MiraLax once or twice daily on a regularly scheduled basis and not as needed. She may use milk of magnesia and Senokot for constipation and gas remains refractory to MiraLax.

## 2011-04-16 ENCOUNTER — Encounter: Payer: Self-pay | Admitting: Gastroenterology

## 2011-04-20 ENCOUNTER — Emergency Department (HOSPITAL_COMMUNITY): Payer: Medicare Other

## 2011-04-20 ENCOUNTER — Encounter (HOSPITAL_COMMUNITY): Payer: Self-pay | Admitting: Emergency Medicine

## 2011-04-20 ENCOUNTER — Emergency Department (HOSPITAL_COMMUNITY)
Admission: EM | Admit: 2011-04-20 | Discharge: 2011-04-20 | Disposition: A | Discharge status: Home / Self Care | Payer: Medicare Other | Attending: Emergency Medicine | Admitting: Emergency Medicine

## 2011-04-20 DIAGNOSIS — R040 Epistaxis: Secondary | ICD-10-CM | POA: Insufficient documentation

## 2011-04-20 DIAGNOSIS — R209 Unspecified disturbances of skin sensation: Secondary | ICD-10-CM | POA: Insufficient documentation

## 2011-04-20 DIAGNOSIS — W010XXA Fall on same level from slipping, tripping and stumbling without subsequent striking against object, initial encounter: Secondary | ICD-10-CM | POA: Insufficient documentation

## 2011-04-20 DIAGNOSIS — Y92009 Unspecified place in unspecified non-institutional (private) residence as the place of occurrence of the external cause: Secondary | ICD-10-CM | POA: Insufficient documentation

## 2011-04-20 DIAGNOSIS — S43006A Unspecified dislocation of unspecified shoulder joint, initial encounter: Secondary | ICD-10-CM | POA: Insufficient documentation

## 2011-04-20 MED ORDER — PROPOFOL 10 MG/ML IV BOLUS
0.5000 mg/kg | Freq: Once | INTRAVENOUS | Status: AC
  Administered 2011-04-20: 60 mg via INTRAVENOUS

## 2011-04-20 MED ORDER — KETAMINE HCL 10 MG/ML IJ SOLN: 0.5000 mg/kg | Freq: Once | INTRAMUSCULAR | Status: DC

## 2011-04-20 MED ORDER — PROPOFOL 10 MG/ML IV EMUL
INTRAVENOUS | Status: AC
  Filled 2011-04-20: qty 20

## 2011-04-20 MED ORDER — KETAMINE HCL 10 MG/ML IJ SOLN
10.0000 mg | Freq: Once | INTRAMUSCULAR | Status: AC
  Administered 2011-04-20: 60 mg via INTRAVENOUS
  Filled 2011-04-20: qty 1

## 2011-04-20 NOTE — Discharge Instructions (Signed)
Shoulder Dislocation A dislocated shoulder means that the bones of the shoulder joint are out of place. The shoulder needs to be put back in place. It is held in place with a sling or shoulder immobilizer. It usually takes 4 to 6 weeks for the shoulder joint to heal completely. Follow up with your caregiver as directed. Do not use your arm until your caregiver approves. You may remove your sling or shoulder immobilizer to bathe, unless otherwise directed by your caregiver. Limit any movement of your arm while the sling or shoulder immobilizer is removed. You are at an increased risk of additional dislocations, especially if you move your shoulder too much before it is healed. A shoulder that has been dislocated several times may require surgery to tighten up the joint in order to prevent dislocations in the future. Nerves around the joint can also be damaged with this injury. This is not common. Watch for signs of nerve damage.  SEEK IMMEDIATE MEDICAL CARE IF: You have numbness or weakness in the injured arm or hand. Document Released: 01/06/2005 Document Revised: 12/26/2010 Document Reviewed: 06/18/2009 [facility] Patient Information 2012 [place], [place].

## 2011-04-20 NOTE — ED Notes (Signed)
PT. SLIPPED AND FELL AT HOME THIS MORNING , NO LOC , AMBULATORY , PRESENTS WITH RIGHT SHOULDER / DEFORMITY .

## 2011-04-20 NOTE — ED Notes (Addendum)
Shoulder back into place. Ortho paged to place sling

## 2011-04-20 NOTE — Progress Notes (Signed)
Orthopedic Tech Progress Note Patient Details:  Olivia Evans [birth year] [id number]  Other Ortho Devices Type of Ortho Device: Other (comment) Ortho Device Location: right shoulder Ortho Device Interventions: Application   Ardine Iacovelli T 04/20/2011, 8:54 AM

## 2011-04-20 NOTE — ED Provider Notes (Signed)
History     CSN: [id number]  Arrival date & time 04/20/11  [id number]   First MD Initiated Contact with Patient 04/20/11 [phone]      Chief Complaint  Patient presents with  . Fall    (Consider location/radiation/quality/duration/timing/severity/associated sxs/prior treatment) HPI  Patient is 78 yo lady with PMH of interstitial cystitis, breast cancer, osteoporosis, s/p of hysterectomy, who presents with a fall and right shoulder deformity and pain.  Patient is generally healthy and active at her baseline. At 5:15 AM, when she woke up and tried to get up from the bed, she accidentally slipped and fell at home, with right shoulder landing on the ground. She noticed right shoulder deformity and pain. Her shoulder pain is alleviated by holding her arms close to the chest and aggravated by movements to all direction. She has numbness in her right hand and fingers. She has a minor injury to her nose. Patient did not loss consciousness. She did not have palpitation and dizziness prior to fall. She got up by herself and called her daughter.   Past Medical History  Diagnosis Date  . Hyperlipidemia   . Interstitial cystitis     Dr McDiarmid  . Villous adenoma of colon 06/2001    Dr Russella Dar  . Breast cancer   . Osteoporosis   . Palpitations   . Diverticulosis   . Hemorrhoids     Past Surgical History  Procedure Date  . Abdominal hysterectomy     with appendectomy ; done for dysfunctional menses  . Breast surgery 2001    lumpectomy  . Colonoscopy with polypectomy 2009    Dr Russella Dar  . Cystoscopy w/ dilation of bladder      X 3  . Varicose vein surgery   . Tubal ligation   . Shoulder surgery     Family History  Problem Relation Age of Onset  . Heart disease Mother      CHF; no MI  . Cancer Sister     breast ; S/P bilateral mastectomy  . Breast cancer Sister   . Diabetes Maternal Grandmother   . Thyroid disease      3 daughters    History  Substance Use Topics  . Smoking status:  Never Smoker   . Smokeless tobacco: Never Used  . Alcohol Use: No    OB History    Grav Para Term Preterm Abortions TAB SAB Ect Mult Living                  Review of Systems  Constitutional: Negative for fever, chills and diaphoresis.  HENT: Positive for nosebleeds. Negative for hearing loss, ear pain, congestion, sore throat, sneezing, drooling, trouble swallowing, neck pain, neck stiffness and voice change.   Eyes: Negative for photophobia, pain, discharge and visual disturbance.  Respiratory: Negative for apnea, cough, choking, shortness of breath and stridor.   Cardiovascular: Negative for chest pain, palpitations and leg swelling.  Gastrointestinal: Negative for nausea, vomiting, abdominal pain, diarrhea, constipation, blood in stool and abdominal distention.  Genitourinary: Negative for dysuria, urgency, frequency and hematuria.  Musculoskeletal: Negative for back pain, joint swelling and gait problem.  Skin: Negative for rash.  Neurological: Positive for weakness and numbness. Negative for dizziness, tremors, seizures, facial asymmetry, light-headedness and headaches.       She has numbness and weakness in right hand.  Hematological: Negative for adenopathy.  Psychiatric/Behavioral: Negative for hallucinations, behavioral problems, confusion and agitation.   Allergies  Fluvastatin sodium; Hydrocodone-acetaminophen; and Simvastatin  Home Medications   Current Outpatient Rx  Name Route Sig Dispense Refill  . TYLENOL ARTHRITIS PAIN PO Oral Take 1 tablet by mouth daily as needed. For arthritis pain    . GABAPENTIN 100 MG PO CAPS Oral Take 100 mg by mouth daily as needed. For pain    . OXYCODONE-ACETAMINOPHEN 5-325 MG PO TABS Oral Take 1 tablet by mouth every 4 (four) hours as needed.    Marland Kitchen MAGNESIUM HYDROXIDE 400 MG/5ML PO SUSP Oral Take 5 mLs by mouth daily as needed. For constipation    . POLYETHYLENE GLYCOL 3350 PO POWD Oral Take 17 g by mouth daily as needed. For  constipation    . SENNOSIDES 8.6 MG PO TABS Oral Take 1 tablet by mouth daily as needed. For constipation      BP 127/63  Pulse 67  Temp(Src) 98.5 F (36.9 C) (Oral)  Resp 18  SpO2 100%  Physical Exam  General: resting in bed, not in acute distress HEENT: PERRL, EOMI, no scleral icterus Cardiac: S1/S2, RRR, No murmurs, gallops or rubs Pulm: Good air movement bilaterally, Clear to auscultation bilaterally, No rales, wheezing, rhonchi or rubs. Abd: Soft,  nondistended, nontender, no rebound pain, no organomegaly, BS present Ext: there is deformity in her right shoulder, tender to palpation. Her pulse is good in right arm. Right hand is warm. No rashes or edema.  Neuro: alert and oriented X3, cranial nerves II-XII grossly intact, muscle strength 5/5 in all extremities except for right hand (3/5).  sensation to light touch intact.    ED Course  Procedures (including critical care time)  Shoulder X-ray showed Anterior/inferior right shoulder dislocation. Patient received Ketamine and propofol for anesthesia. Then shoulder reduction procedure was successfully performed. After the procedure, repeated X-ray confirmed reduced right shoulder dislocation. Patient feels better. She will be discharged home.    Labs Reviewed - No data to display No results found.   No diagnosis found.    MDM          Lorretta Harp, MD 04/20/11 [phone]

## 2011-04-20 NOTE — ED Provider Notes (Signed)
I saw and evaluated the patient, reviewed the resident's note and I agree with the findings and plan.   .Face to face Exam:  General:  Awake HEENT:  Atraumatic Resp:  Normal effort Abd:  Nondistended Neuro:No focal weakness Lymph: No adenopathy I saw and evaluated the patient, reviewed the resident's note and I agree with the findings and plan.   .Face to face Exam:  General:  Awake HEENT:  Atraumatic Resp:  Normal effort Abd:  Nondistended Neuro:No focal weakness Lymph: No adenopathy I saw and evaluated the patient, reviewed the resident's note and I agree with the findings and plan.   .Face to face Exam:  General:  Awake HEENT:  Atraumatic Resp:  Normal effort Abd:  Nondistended Neuro:No focal weakness Lymph: No adenopathy   Closed reduction done with ketophol.  Post reduction film showed successful reduction    Nelia Shi, MD 04/20/11 [id number]

## 2011-04-20 NOTE — ED Notes (Signed)
Pt states that she was getting out of bed and she fell, pt did hit head but denies LOC. Pt states that she was not able to get up for about 30 min then she managaged to call her daughter who helped her get to car and here. Pt has an obvious deformity to her right shoulder. Pt able to wiggle fingers and sensation intact but pt feels the fingers are getting numb.

## 2011-04-21 ENCOUNTER — Encounter: Payer: Self-pay | Admitting: Gastroenterology

## 2011-04-22 ENCOUNTER — Encounter: Payer: Medicare Other | Admitting: Gastroenterology

## 2011-05-12 ENCOUNTER — Encounter: Payer: Self-pay | Admitting: Gastroenterology

## 2011-05-12 ENCOUNTER — Ambulatory Visit (AMBULATORY_SURGERY_CENTER): Payer: Medicare Other | Admitting: Gastroenterology

## 2011-05-12 VITALS — BP 175/87 | HR 87 | Temp 96.3°F | Resp 20 | Ht 66.0 in | Wt 132.0 lb

## 2011-05-12 DIAGNOSIS — Z1211 Encounter for screening for malignant neoplasm of colon: Secondary | ICD-10-CM

## 2011-05-12 DIAGNOSIS — K5900 Constipation, unspecified: Secondary | ICD-10-CM

## 2011-05-12 DIAGNOSIS — Z860100 Personal history of colon polyps, unspecified: Secondary | ICD-10-CM

## 2011-05-12 MED ORDER — SODIUM CHLORIDE 0.9 % IV SOLN: 500.0000 mL | INTRAVENOUS | Status: DC

## 2011-05-12 NOTE — Patient Instructions (Signed)

## 2011-05-12 NOTE — Op Note (Signed)
[facility] [address] [place], [place]  [address]  COLONOSCOPY PROCEDURE REPORT  PATIENT:  Olivia Evans, Olivia Evans  MR#:  [id number] BIRTHDATE:  07/30/1926, 85 yrs. old  GENDER:  female ENDOSCOPIST:  Judie Petit T. Russella Dar, MD, [facility]  PROCEDURE DATE:  05/12/2011 PROCEDURE:  Colonoscopy 65784 ASA CLASS:  Class II INDICATIONS:  1) surveillance and high-risk screening  2) history of pre-cancerous colon polyps: villous adenoma, 2003 MEDICATIONS:   These medications were titrated to patient response per physician's verbal order, Fentanyl 100 mcg IV, Versed 10 mg IV DESCRIPTION OF PROCEDURE:   After the risks benefits and alternatives of the procedure were thoroughly explained, informed consent was obtained.  Digital rectal exam was performed and revealed no abnormalities.  The LB [id number]-[id number] endoscope was introduced through the anus and advanced to the cecum, which was identified by both the appendix and ileocecal valve, with a tortuous colon, moderately difficult procedure.  The quality of the prep was excellent, using MoviPrep.  The instrument was then slowly withdrawn as the colon was fully examined. <<PROCEDUREIMAGES>> FINDINGS:  Moderate diverticulosis was found in the sigmoid colon. Otherwise normal colonoscopy without other polyps, masses, vascular ectasias, or inflammatory changes.   Retroflexed views in the rectum revealed no abnormalities.    The time to cecum =  5.5 minutes. The scope was then withdrawn (time =  10.25  min) from the patient and the procedure completed.  COMPLICATIONS:  None  ENDOSCOPIC IMPRESSION: 1) Moderate diverticulosis in the sigmoid colon  RECOMMENDATIONS: 1) High fiber diet with liberal fluid intake. 2) Given your age, you will not need another colonoscopy for colon cancer screening or polyp surveillance. These types of tests usually stop around the age 30.  Venita Lick. Russella Dar, MD, [facility]  n. eSIGNED:   Venita Lick. Andreah Goheen at 05/12/2011 02:51  PM  Argabright, Lewanda Rife, [phone]

## 2011-05-15 ENCOUNTER — Ambulatory Visit: Payer: Medicare Other | Admitting: Cardiovascular Disease

## 2011-05-15 ENCOUNTER — Encounter: Payer: Self-pay | Admitting: Cardiovascular Disease

## 2011-06-07 ENCOUNTER — Other Ambulatory Visit: Payer: Self-pay | Admitting: Internal Medicine

## 2011-06-09 NOTE — Telephone Encounter (Signed)
OK x 1 

## 2011-06-09 NOTE — Telephone Encounter (Signed)
Last filled 10/09/2009, Last OV 04/01/11  Dr.Hopper please advise

## 2011-06-09 NOTE — Telephone Encounter (Signed)
RX sent

## 2011-07-21 ENCOUNTER — Other Ambulatory Visit: Payer: Self-pay | Admitting: Urology

## 2011-07-22 ENCOUNTER — Encounter (HOSPITAL_BASED_OUTPATIENT_CLINIC_OR_DEPARTMENT_OTHER): Payer: Self-pay | Admitting: *Deleted

## 2011-07-22 NOTE — Progress Notes (Signed)
NPO AFTER MN. ARRIVES AT 1045. NEEDS HG . CURRENT EKG IN CHART AND EPIC.

## 2011-07-23 ENCOUNTER — Telehealth: Payer: Self-pay | Admitting: Internal Medicine

## 2011-07-23 NOTE — Telephone Encounter (Addendum)
Faxed 11 pages to Dr. Marga Melnick for review on 07-23-11 ym

## 2011-07-28 NOTE — H&P (Signed)
History of Present Illness   Olivia Evans continues to have abdominal pain and frequency that can be quite severe. It has been 10 months since we did the fulguration and hydrodistension and she was pain free for approximately 6 weeks or longer. She did not qualify for the TARIS study.  Many days she does not take Percocet and on a bad day she will take two. She had a lot of negative urine cultures.  Review of systems: No change in bowel or neurologic status.    Past Medical History Problems  1. History of  Bladder Irrigation 2. History of  Breast Cancer V10.3 3. History of  Hypercholesterolemia 272.0 4. History of  Murmurs 785.2 5. History of  Thrombophlebitis Of Deep Vessels Of The Lower Extremity V12.51  Surgical History Problems  1. History of  Bladder Irrigation 2. History of  Breast Surgery Lumpectomy 3. History of  Cystoscopy With Biopsy 4. History of  Cystoscopy With Dilation Of Bladder 5. History of  Cystoscopy With Dilation Of Bladder 6. History of  Excision Of Soft Tissue Tumor Of Foot 7. History of  Hysterectomy 8. History of  Shoulder Surgery 9. History of  Tubal Ligation V25.2 10. History of  Varicose Vein Ligation  Current Meds 1. Gabapentin TABS; Therapy: (Recorded:17Jun2010) to 2. MiraLax POWD; Therapy: (Recorded:17Jun2010) to 3. Oxycodone-Acetaminophen 7.5-500 MG Oral Tablet; TAKE 1 TABLET EVERY 4 TO 6 HOURS AS  NEEDED FOR PAIN; Therapy: 11Nov2011 to (Evaluate:03May2013); Last Rx:26Apr2013 4. Tylenol Arthritis Pain 650 MG Oral Tablet Extended Release; Therapy: 25Apr2013 to  Allergies Medication  1. Actonel TABS 2. Amoxicillin TABS 3. Neurontin TABS 4. Premarin CREA 5. Simvastatin TABS 6. Vicodin TABS  Family History Problems  1. Family history of  Family Health Status Number Of Children 4 children  Social History Problems  1. Activities Of Daily Living 2. Caffeine Use 1-2 3. Exercise Habits no reg exer but is active at home with alll  chores. 4. Marital History - Widowed 5. Never A Smoker 6. Occupation: retired 7. Self-reliant In Usual Daily Activities Denied  8. History of  Alcohol Use 9. History of  Former Smoker  Results/Data Urine [Data Includes: Last 1 Day]   01Jul2013  COLOR YELLOW   APPEARANCE CLEAR   SPECIFIC GRAVITY <1.005   pH 6.0   GLUCOSE NEG mg/dL  BILIRUBIN NEG   KETONE NEG mg/dL  BLOOD MOD   PROTEIN NEG mg/dL  UROBILINOGEN 0.2 mg/dL  NITRITE NEG   LEUKOCYTE ESTERASE TRACE   SQUAMOUS EPITHELIAL/HPF FEW   WBC 7-10 WBC/hpf  RBC 3-6 RBC/hpf  BACTERIA FEW   CRYSTALS NONE SEEN   CASTS NONE SEEN    Assessment Assessed  1. Urge And Stress Incontinence 788.33 2. Chronic Interstitial Cystitis 595.1  Plan Health Maintenance (V70.0)  1. UA With REFLEX  Done: 01Jul2013 11:45AM  Discussion/Summary   I thought it was reasonable to schedule Olivia Evans for a cystoscopy and hydrodistension and fulguration of Hunner's ulcers. Percocet was given.   After a thorough review of the management options for the patient's condition the patient  elected to proceed with surgical therapy as noted above. We have discussed the potential benefits and risks of the procedure, side effects of the proposed treatment, the likelihood of the patient achieving the goals of the procedure, and any potential problems that might occur during the procedure or recuperation. Informed consent has been obtained.

## 2011-07-29 ENCOUNTER — Encounter (HOSPITAL_BASED_OUTPATIENT_CLINIC_OR_DEPARTMENT_OTHER): Payer: Self-pay | Admitting: Anesthesiology

## 2011-07-29 ENCOUNTER — Ambulatory Visit (HOSPITAL_BASED_OUTPATIENT_CLINIC_OR_DEPARTMENT_OTHER): Payer: Medicare Other | Admitting: Anesthesiology

## 2011-07-29 ENCOUNTER — Encounter (HOSPITAL_BASED_OUTPATIENT_CLINIC_OR_DEPARTMENT_OTHER)
Admission: RE | Disposition: A | Discharge status: Home / Self Care | Payer: Self-pay | Source: Ambulatory Visit | Attending: Urology

## 2011-07-29 ENCOUNTER — Ambulatory Visit (HOSPITAL_BASED_OUTPATIENT_CLINIC_OR_DEPARTMENT_OTHER)
Admission: RE | Admit: 2011-07-29 | Discharge: 2011-07-29 | Disposition: A | Discharge status: Home / Self Care | Payer: Medicare Other | Source: Ambulatory Visit | Attending: Urology | Admitting: Urology

## 2011-07-29 ENCOUNTER — Encounter (HOSPITAL_BASED_OUTPATIENT_CLINIC_OR_DEPARTMENT_OTHER): Payer: Self-pay | Admitting: *Deleted

## 2011-07-29 DIAGNOSIS — N3010 Interstitial cystitis (chronic) without hematuria: Secondary | ICD-10-CM | POA: Insufficient documentation

## 2011-07-29 DIAGNOSIS — Z79899 Other long term (current) drug therapy: Secondary | ICD-10-CM | POA: Insufficient documentation

## 2011-07-29 HISTORY — DX: Other symptoms and signs involving the genitourinary system: R39.89

## 2011-07-29 HISTORY — DX: Personal history of malignant neoplasm of breast: Z85.3

## 2011-07-29 HISTORY — PX: CYSTO WITH HYDRODISTENSION: SHX5453

## 2011-07-29 LAB — POCT HEMOGLOBIN-HEMACUE: Hemoglobin: 12.9 g/dL (ref 12.0–15.0)

## 2011-07-29 SURGERY — CYSTOSCOPY, WITH BLADDER HYDRODISTENSION
Anesthesia: General | Site: Bladder | Wound class: Clean Contaminated

## 2011-07-29 MED ORDER — FENTANYL CITRATE 0.05 MG/ML IJ SOLN: 25.0000 ug | INTRAMUSCULAR | Status: DC | PRN

## 2011-07-29 MED ORDER — PHENAZOPYRIDINE HCL 200 MG PO TABS
ORAL | Status: DC | PRN
  Administered 2011-07-29: 13:00:00 via INTRAVESICAL

## 2011-07-29 MED ORDER — CIPROFLOXACIN HCL 250 MG PO TABS: 250.0000 mg | ORAL_TABLET | Freq: Two times a day (BID) | ORAL | Status: AC

## 2011-07-29 MED ORDER — PROMETHAZINE HCL 25 MG/ML IJ SOLN: 6.2500 mg | INTRAMUSCULAR | Status: DC | PRN

## 2011-07-29 MED ORDER — LACTATED RINGERS IV SOLN
INTRAVENOUS | Status: DC
  Administered 2011-07-29: 100 mL/h via INTRAVENOUS

## 2011-07-29 MED ORDER — DEXAMETHASONE SODIUM PHOSPHATE 4 MG/ML IJ SOLN
INTRAMUSCULAR | Status: DC | PRN
  Administered 2011-07-29: 5 mg via INTRAVENOUS

## 2011-07-29 MED ORDER — FENTANYL CITRATE 0.05 MG/ML IJ SOLN
INTRAMUSCULAR | Status: DC | PRN
  Administered 2011-07-29: 50 ug via INTRAVENOUS
  Administered 2011-07-29 (×2): 25 ug via INTRAVENOUS
  Administered 2011-07-29: 50 ug via INTRAVENOUS

## 2011-07-29 MED ORDER — ONDANSETRON HCL 4 MG/2ML IJ SOLN
INTRAMUSCULAR | Status: DC | PRN
  Administered 2011-07-29: 4 mg via INTRAVENOUS

## 2011-07-29 MED ORDER — CIPROFLOXACIN IN D5W 400 MG/200ML IV SOLN
400.0000 mg | INTRAVENOUS | Status: AC
  Administered 2011-07-29: 400 mg via INTRAVENOUS

## 2011-07-29 MED ORDER — PROPOFOL 10 MG/ML IV EMUL
INTRAVENOUS | Status: DC | PRN
  Administered 2011-07-29: 120 mg via INTRAVENOUS

## 2011-07-29 MED ORDER — LIDOCAINE HCL (CARDIAC) 20 MG/ML IV SOLN
INTRAVENOUS | Status: DC | PRN
  Administered 2011-07-29: 50 mg via INTRAVENOUS

## 2011-07-29 MED ORDER — STERILE WATER FOR IRRIGATION IR SOLN
Status: DC | PRN
  Administered 2011-07-29: 3000 mL

## 2011-07-29 SURGICAL SUPPLY — 19 items
BAG DRAIN URO-CYSTO SKYTR STRL (DRAIN) ×2 IMPLANT
BAG DRN UROCATH (DRAIN) ×1
CANISTER SUCT LVC 12 LTR MEDI- (MISCELLANEOUS) ×1 IMPLANT
CATH FOLEY 2WAY SLVR  5CC 18FR (CATHETERS)
CATH FOLEY 2WAY SLVR 5CC 18FR (CATHETERS) IMPLANT
CATH ROBINSON RED A/P 12FR (CATHETERS) IMPLANT
CATH ROBINSON RED A/P 14FR (CATHETERS) ×1 IMPLANT
CLOTH BEACON ORANGE TIMEOUT ST (SAFETY) ×2 IMPLANT
DRAPE CAMERA CLOSED 9X96 (DRAPES) ×2 IMPLANT
ELECT REM PT RETURN 9FT ADLT (ELECTROSURGICAL) ×2
ELECTRODE REM PT RTRN 9FT ADLT (ELECTROSURGICAL) IMPLANT
GLOVE BIO SURGEON STRL SZ7.5 (GLOVE) ×2 IMPLANT
GOWN STRL REIN XL XLG (GOWN DISPOSABLE) ×2 IMPLANT
NDL SAFETY ECLIPSE 18X1.5 (NEEDLE) ×1 IMPLANT
NEEDLE HYPO 18GX1.5 SHARP (NEEDLE) ×2
PACK CYSTOSCOPY (CUSTOM PROCEDURE TRAY) ×2 IMPLANT
SUT SILK 0 TIES 10X30 (SUTURE) IMPLANT
SYR 20CC LL (SYRINGE) ×2 IMPLANT
WATER STERILE IRR 3000ML UROMA (IV SOLUTION) ×2 IMPLANT

## 2011-07-29 NOTE — Transfer of Care (Signed)
Immediate Anesthesia Transfer of Care Note  Patient: Olivia Evans  Procedure(s) Performed: Procedure(s) (LRB): CYSTOSCOPY/HYDRODISTENSION (N/A)  Patient Location: PACU  Anesthesia Type: General  Level of Consciousness: drowsy, arouses to name, follows commands  Airway & Oxygen Therapy: Patient Spontanous Breathing and Patient connected to face mask oxygen  Post-op Assessment: Report given to PACU RN and Post -op Vital signs reviewed and stable  Post vital signs: Reviewed and stable  Complications: No apparent anesthesia complications

## 2011-07-29 NOTE — Anesthesia Preprocedure Evaluation (Addendum)
Anesthesia Evaluation  Patient identified by MRN, date of birth, ID band Patient awake    Reviewed: Allergy & Precautions, H&P , NPO status , Patient's Chart, lab work & pertinent test results, reviewed documented beta blocker date and time   Airway Mallampati: II TM Distance: >3 FB Neck ROM: full    Dental No notable dental hx.    Pulmonary neg pulmonary ROS,  breath sounds clear to auscultation  Pulmonary exam normal       Cardiovascular Exercise Tolerance: Good + Valvular Problems/Murmurs Rhythm:regular Rate:Normal  Palpitations, murmur.  ECG: NSR, incomplete RBBB   Neuro/Psych negative neurological ROS  negative psych ROS   GI/Hepatic negative GI ROS, Neg liver ROS,   Endo/Other  negative endocrine ROS  Renal/GU negative Renal ROS  negative genitourinary   Musculoskeletal   Abdominal   Peds  Hematology negative hematology ROS (+)   Anesthesia Other Findings   Reproductive/Obstetrics negative OB ROS                          Anesthesia Physical Anesthesia Plan  ASA: II  Anesthesia Plan: General LMA   Post-op Pain Management:    Induction:   Airway Management Planned: LMA  Additional Equipment:   Intra-op Plan:   Post-operative Plan:   Informed Consent: I have reviewed the patients History and Physical, chart, labs and discussed the procedure including the risks, benefits and alternatives for the proposed anesthesia with the patient or authorized representative who has indicated his/her understanding and acceptance.   Dental Advisory Given  Plan Discussed with: CRNA  Anesthesia Plan Comments: (Has had this procedure 3 previous times without anesthesia complication.)       Anesthesia Quick Evaluation

## 2011-07-29 NOTE — Op Note (Signed)
Preoperative diagnosis: Interstitial cystitis and Hunner's ulcers Postoperative diagnosis interstitial cystitis and Hunner's ulcers Surgery cystoscopy bladder hydrodistention and fulguration of Hunter's ulcers Surgeon: Dr. Lorin Picket Taelor Moncada  The patient has the above diagnoses. A number bladder problem well. X-ray care was taken with leg positioning. Ciprofloxacin was given  I cystoscoped the patient and only hydrodistended her to approxi-150 mL. I did not want to cause a bladder perforation. I could see the splitting of the mucosa as I filled her bladder and the typical areas outline of my last dictation. She had cultures on both sides of the bladder especially 3 and 11:00  Keeping bladder volume lower and the fulguration setting at 30 I fulgurated at 3:00 10:00 and at 5:00. I fulgurated a little bit more than it did last time and is very pleased with the or seizure. I was well away from the trigone and ureteral orifices that were easily identified  There was no bladder injury. Bladder was emptied. Patient was taken to recovery room. Cystoscopically there was no other foreign body or evidence of carcinoma

## 2011-07-29 NOTE — Anesthesia Postprocedure Evaluation (Signed)
  Anesthesia Post-op Note  Patient: Olivia Evans  Procedure(s) Performed: Procedure(s) (LRB): CYSTOSCOPY/HYDRODISTENSION (N/A)  Patient Location: PACU  Anesthesia Type: General  Level of Consciousness: awake and alert   Airway and Oxygen Therapy: Patient Spontanous Breathing  Post-op Pain: mild  Post-op Assessment: Post-op Vital signs reviewed, Patient's Cardiovascular Status Stable, Respiratory Function Stable, Patent Airway and No signs of Nausea or vomiting  Post-op Vital Signs: stable  Complications: No apparent anesthesia complications

## 2011-07-29 NOTE — Interval H&P Note (Signed)
History and Physical Interval Note:  07/29/2011 12:19 PM  Olivia Evans  has presented today for surgery, with the diagnosis of pelvic pain and hunners ulcer  The various methods of treatment have been discussed with the patient and family. After consideration of risks, benefits and other options for treatment, the patient has consented to  Procedure(s) (LRB): CYSTOSCOPY/HYDRODISTENSION (N/A) as a surgical intervention .  The patient's history has been reviewed, patient examined, no change in status, stable for surgery.  I have reviewed the patients' chart and labs.  Questions were answered to the patient's satisfaction.     Calee Nugent A

## 2011-07-29 NOTE — Anesthesia Procedure Notes (Signed)
Procedure Name: LMA Insertion Date/Time: 07/29/2011 12:35 PM Performed by: Norva Pavlov Pre-anesthesia Checklist: Patient identified, Emergency Drugs available, Suction available and Patient being monitored Patient Re-evaluated:Patient Re-evaluated prior to inductionOxygen Delivery Method: Circle System Utilized Preoxygenation: Pre-oxygenation with 100% oxygen Intubation Type: IV induction Ventilation: Mask ventilation without difficulty LMA: LMA inserted LMA Size: 4.0 Number of attempts: 1 Airway Equipment and Method: bite block Placement Confirmation: positive ETCO2 Tube secured with: Tape Dental Injury: Teeth and Oropharynx as per pre-operative assessment

## 2011-07-30 ENCOUNTER — Encounter (HOSPITAL_BASED_OUTPATIENT_CLINIC_OR_DEPARTMENT_OTHER): Payer: Self-pay | Admitting: Urology

## 2011-12-10 ENCOUNTER — Ambulatory Visit (INDEPENDENT_AMBULATORY_CARE_PROVIDER_SITE_OTHER): Payer: Medicare Other

## 2011-12-10 DIAGNOSIS — Z23 Encounter for immunization: Secondary | ICD-10-CM

## 2012-05-11 ENCOUNTER — Encounter: Payer: Self-pay | Admitting: Internal Medicine

## 2012-05-11 ENCOUNTER — Ambulatory Visit (INDEPENDENT_AMBULATORY_CARE_PROVIDER_SITE_OTHER): Payer: Medicare Other | Admitting: Internal Medicine

## 2012-05-11 VITALS — BP 148/72 | HR 68 | Wt 128.0 lb

## 2012-05-11 DIAGNOSIS — R600 Localized edema: Secondary | ICD-10-CM

## 2012-05-11 DIAGNOSIS — I10 Essential (primary) hypertension: Secondary | ICD-10-CM

## 2012-05-11 DIAGNOSIS — R609 Edema, unspecified: Secondary | ICD-10-CM

## 2012-05-11 MED ORDER — HYDROCHLOROTHIAZIDE 12.5 MG PO CAPS: 12.5000 mg | ORAL_CAPSULE | Freq: Every day | ORAL | Status: DC

## 2012-05-11 NOTE — Progress Notes (Signed)
  Subjective:    Patient ID: Olivia Evans, female    DOB: 11-10-1926, 79 y.o.   MRN: [id number]  HPI  Her blood pressure is elevated when she is in pain related to interstitial cystitis. Her urologist has her on a narcotic pain medication.  For the last week she's also had some pedal edema bilaterally. She does not add salt at the table.    Review of Systems  With the she does not believe that she has tachycardia or dysrhythmias with the blood pressure elevation. She denies dyspnea on exertion but  is limited in her exercise due to bladder pain with ambulation.  She denies paroxysmal nocturnal dyspnea.       Objective:   Physical Exam She appears thin but healthy and well-nourished & in no acute distress.Appears younger than stated age  No carotid bruits are present.No neck pain distention present at 10 - 15 degrees.  Heart rhythm and rate are normal with no significant murmurs or gallops.Loud S4  Chest is clear with no increased work of breathing  Aorta is palpable but there is no evidence of aortic aneurysm or renal artery bruits  Abdomen soft with no organomegaly or masses. No HJR  No clubbing or  Cyanosis. Trace edema present.  Posterior tibial pulses are decreased  No ischemic skin changes are present . Nails healthy. Alert and oriented. Strength, tone normal           Assessment & Plan:  #1 HTN #2 edema Plan: See orders and recommendations

## 2012-05-11 NOTE — Patient Instructions (Addendum)

## 2012-05-12 LAB — BASIC METABOLIC PANEL
BUN: 13 mg/dL (ref 6–23)
Calcium: 10.1 mg/dL (ref 8.4–10.5)
Creatinine, Ser: 0.7 mg/dL (ref 0.4–1.2)
GFR: 86.11 mL/min (ref >60.00)
Glucose, Bld: 91 mg/dL (ref 70–99)
Potassium: 3.9 mEq/L (ref 3.5–5.1)

## 2012-06-30 ENCOUNTER — Ambulatory Visit (INDEPENDENT_AMBULATORY_CARE_PROVIDER_SITE_OTHER): Payer: Medicare Other | Admitting: Internal Medicine

## 2012-06-30 ENCOUNTER — Encounter: Payer: Self-pay | Admitting: Internal Medicine

## 2012-06-30 VITALS — BP 118/70 | HR 85 | Resp 12 | Ht 66.0 in | Wt 125.0 lb

## 2012-06-30 DIAGNOSIS — E559 Vitamin D deficiency, unspecified: Secondary | ICD-10-CM

## 2012-06-30 DIAGNOSIS — E785 Hyperlipidemia, unspecified: Secondary | ICD-10-CM

## 2012-06-30 DIAGNOSIS — Z0000 Encounter for general adult medical examination without abnormal findings: Secondary | ICD-10-CM

## 2012-06-30 DIAGNOSIS — Z86010 Personal history of colonic polyps: Secondary | ICD-10-CM

## 2012-06-30 NOTE — Patient Instructions (Addendum)
Please review the medication list in the After Visit Summary provided.Please verify the medication name (this may be  brand or generic) & correct dosage. Write the name of the prescribing physician to the right of the medication and share this with all medical staff seen at each appointment. This will help provide continuity of care; help optimize therapeutic interventions;and help prevent drug:drug adverse reaction. Share results with all non [facility] medical staff seen  

## 2012-06-30 NOTE — Progress Notes (Signed)
Subjective:    Patient ID: Olivia Evans, female    DOB: 07-23-1926, 79 y.o.   MRN: [id number]  HPI Medicare Wellness Visit:  Psychosocial & medical history were reviewed as required by Medicare (abuse,antisocial behavioral risks,firearm risk).  Social history: caffeine: no  , alcohol:no   ,  tobacco use: no   Exercise : intermittently  Home & personal  safety / fall risk:no Limitations of activities of daily living:no Seatbelt  and smoke alarm use:yes Power of Attorney/Living Will status : in place Ophthalmology exam status :current Hearing evaluation status:not current Orientation :oriented X 3 Memory & recall :good Spelling  testing:good Active depression / anxiety:denied Foreign travel history : [place] remotely Immunization status for Shingles /Flu/ PNA/ tetanus :Shingles needed Transfusion history: no  Preventive health surveillance status of colonoscopy, BMD , mammograms,PAP as per protocol/ JXB:JYNWGNF Dental care:  Every 6 months Chart reviewed &  Updated. Active issues reviewed & addressed.      Review of Systems On hydrochlorothiazide 12.5 mg edema resolved; blood pressures have ranged from 105/54-132/71.   She denies chest pain, palpitations, exertional dyspnea, or paroxysmal nocturnal dyspnea.     Objective:   Physical Exam Gen.: Thin but healthy and well-nourished in appearance. Alert, appropriate and cooperative throughout exam.Appears younger than stated age  Head: Normocephalic without obvious abnormalities  Eyes: No corneal or conjunctival inflammation noted. Pupils equal round reactive to light and accommodation.  Extraocular motion intact. Vision grossly normal with lenses Ears: External  ear exam reveals no significant lesions or deformities. Canals clear .TMs normal. Hearing is grossly normal bilaterally. Nose: External nasal exam reveals no deformity or inflammation. Nasal mucosa are pink and moist. No lesions or exudates noted. Septum to L  Mouth:  Oral mucosa and oropharynx reveal no lesions or exudates. Upper denture Neck: No deformities, masses, or tenderness noted. Range of motion decreased to R. Thyroid normal. Lungs: Normal respiratory effort; chest expands symmetrically. Lungs are clear to auscultation without rales, wheezes, or increased work of breathing. Heart: Normal rate and rhythm. Normal S1 ;accentuated  S2. No gallop, click, or rub. S4 w/o murmur. Abdomen: Bowel sounds normal; abdomen soft and nontender. No masses, organomegaly or hernias noted.Aorta palpable ; no AAA Genitalia: As per Gyn                                  Musculoskeletal/extremities: No deformity or scoliosis noted of  the thoracic or lumbar spine.  No clubbing, cyanosis, edema, or significant extremity  deformity noted. Range of motion normal .Tone & strength  Normal. Joints normal . Nail health good. Able to lie down & sit up w/o help. Negative SLR bilaterally Vascular: Carotid, radial artery, dorsalis pedis and  posterior tibial pulses are full and equal. No bruits present. Neurologic: Alert and oriented x3. Deep tendon reflexes symmetrical and normal.         Skin: Intact without suspicious lesions or rashes. Lymph: No cervical, axillary lymphadenopathy present. Psych: Mood and affect are normal. Normally interactive  Assessment & Plan:  #1 Medicare Wellness Exam; criteria met ; data entered #2 Problem List/Diagnoses reviewed #3 edema , resolved #4 dyslipidemia, statin intolerant Plan:  Assessments made/ Orders entered

## 2012-07-01 LAB — CBC WITH DIFFERENTIAL/PLATELET
Basophils Relative: 0.5 % (ref 0.0–3.0)
Eosinophils Relative: 2.7 % (ref 0.0–5.0)
MCV: 101.6 fl — ABNORMAL HIGH (ref 78.0–100.0)
Monocytes Absolute: 0.2 10*3/uL (ref 0.1–1.0)
Neutrophils Relative %: 57.2 % (ref 43.0–77.0)
RBC: 3.77 Mil/uL — ABNORMAL LOW (ref 3.87–5.11)
WBC: 4.5 10*3/uL (ref 4.5–10.5)

## 2012-07-07 LAB — VITAMIN D 1,25 DIHYDROXY: Vitamin D3 1, 25 (OH)2: 50 pg/mL

## 2012-08-03 ENCOUNTER — Other Ambulatory Visit: Payer: Self-pay | Admitting: *Deleted

## 2012-08-03 DIAGNOSIS — R600 Localized edema: Secondary | ICD-10-CM

## 2012-08-03 DIAGNOSIS — I10 Essential (primary) hypertension: Secondary | ICD-10-CM

## 2012-08-03 MED ORDER — HYDROCHLOROTHIAZIDE 12.5 MG PO CAPS: 12.5000 mg | ORAL_CAPSULE | Freq: Every day | ORAL | Status: DC

## 2012-08-03 NOTE — Telephone Encounter (Signed)
Rx sent 

## 2012-11-01 ENCOUNTER — Other Ambulatory Visit: Payer: Self-pay | Admitting: Internal Medicine

## 2012-11-01 NOTE — Telephone Encounter (Signed)
HCTZ refill sent to pharmacy 

## 2012-11-04 ENCOUNTER — Ambulatory Visit (INDEPENDENT_AMBULATORY_CARE_PROVIDER_SITE_OTHER): Payer: Medicare Other

## 2012-11-04 DIAGNOSIS — Z23 Encounter for immunization: Secondary | ICD-10-CM

## 2013-01-27 ENCOUNTER — Other Ambulatory Visit: Payer: Self-pay | Admitting: Internal Medicine

## 2013-01-28 NOTE — Telephone Encounter (Signed)
Rx denied request to early.//AB/CMA

## 2013-02-08 ENCOUNTER — Telehealth: Payer: Self-pay | Admitting: *Deleted

## 2013-02-08 NOTE — Telephone Encounter (Signed)
Message sent to patient concerning Dr. Clayborn Heron upcoming move to [facility].

## 2013-02-24 ENCOUNTER — Encounter: Payer: Self-pay | Admitting: Internal Medicine

## 2013-02-24 ENCOUNTER — Other Ambulatory Visit (INDEPENDENT_AMBULATORY_CARE_PROVIDER_SITE_OTHER): Payer: Managed Care, Other (non HMO)

## 2013-02-24 ENCOUNTER — Telehealth: Payer: Self-pay | Admitting: Internal Medicine

## 2013-02-24 ENCOUNTER — Ambulatory Visit (INDEPENDENT_AMBULATORY_CARE_PROVIDER_SITE_OTHER): Payer: Managed Care, Other (non HMO) | Admitting: Internal Medicine

## 2013-02-24 ENCOUNTER — Other Ambulatory Visit: Payer: Self-pay | Admitting: General Practice

## 2013-02-24 DIAGNOSIS — N3010 Interstitial cystitis (chronic) without hematuria: Secondary | ICD-10-CM

## 2013-02-24 DIAGNOSIS — T887XXA Unspecified adverse effect of drug or medicament, initial encounter: Secondary | ICD-10-CM

## 2013-02-24 LAB — BASIC METABOLIC PANEL
BUN: 16 mg/dL (ref 6–23)
CALCIUM: 10.1 mg/dL (ref 8.4–10.5)
CO2: 32 meq/L (ref 19–32)
CREATININE: 0.9 mg/dL (ref 0.4–1.2)
Chloride: 104 mEq/L (ref 96–112)
GFR: 60.15 mL/min (ref >60.00)
Glucose, Bld: 85 mg/dL (ref 70–99)
Potassium: 4 mEq/L (ref 3.5–5.1)
SODIUM: 141 meq/L (ref 135–145)

## 2013-02-24 LAB — HEPATIC FUNCTION PANEL
ALK PHOS: 57 U/L (ref 39–117)
ALT: 18 U/L (ref 0–35)
AST: 22 U/L (ref 0–37)
Albumin: 4 g/dL (ref 3.5–5.2)
BILIRUBIN DIRECT: 0 mg/dL (ref 0.0–0.3)
BILIRUBIN TOTAL: 0.5 mg/dL (ref 0.3–1.2)
Total Protein: 6.8 g/dL (ref 6.0–8.3)

## 2013-02-24 MED ORDER — ZOSTER VACCINE LIVE 19400 UNT/0.65ML ~~LOC~~ SOLR: 0.6500 mL | Freq: Once | SUBCUTANEOUS | Status: DC

## 2013-02-24 NOTE — Progress Notes (Signed)
   Subjective:    Patient ID: JELISHA WEED, female    DOB: 1926/12/01, 80 y.o.   MRN: [id number]  HPI She is being followed by Dr. Vikki Ports for interstitial cystitis.  Despite increasing the dose of Myrbetriq there's been no significant response.She is concerned about the potential hepatic risk of this medication. It actually has both hepatic and renal metabolism.   She continues to have pain prior to urination. She holds urine she notes no blood clots.    Review of Systems She denies fever, chills, sweats, or unexplained weight loss.   She denies right upper quadrant discomfort. She has no history of scleral icterus or jaundice.  His stools are clay colored and urine is not Coke-colored.    Objective:   Physical Exam General appearance : Thin but in good health  and nourishment w/o distress.  Eyes: No conjunctival inflammation or scleral icterus is present.  Oral exam:  Upper plate lower partial. Dental hygiene is good; lips and gums are healthy appearing.There is no oropharyngeal erythema or exudate noted.   Heart:  Normal rate and regular rhythm. S1 and S2 normal without gallop, murmur, click, rub or other extra sounds  . S4 without murmur   Lungs:Chest clear to auscultation; no wheezes, rhonchi,rales ,or rubs present.No increased work of breathing.   Abdomen: bowel sounds normal, soft and non-tender without masses, organomegaly or hernias noted.  No guarding or rebound . No tenderness over the flanks to percussion. Aorta palpable without aneurysm  Musculoskeletal: Able to lie flat and sit up without help. Negative straight leg raising bilaterally. Gait normal  Skin:Warm & dry.  Intact without suspicious lesions or rashes ; no jaundice . Slight tenting Lymphatic: No lymphadenopathy is noted about the head, neck, axilla,               Assessment & Plan:  See problem list and recommendations.

## 2013-02-24 NOTE — Progress Notes (Signed)
Pre visit review using our clinic review tool, if applicable. No additional management support is needed unless otherwise documented below in the visit note. 

## 2013-02-24 NOTE — Telephone Encounter (Signed)
OK 

## 2013-02-24 NOTE — Telephone Encounter (Signed)
02/24/2013  Pt was seen today and forgot to get an rx for the shingles vaccine;  Pt is requesting an rx for the shingles vaccine.

## 2013-02-24 NOTE — Assessment & Plan Note (Signed)
BMET & LFT

## 2013-02-24 NOTE — Patient Instructions (Signed)
Share results with Dr McDiarmid

## 2013-02-24 NOTE — Telephone Encounter (Signed)
Med filled to local pharmacy, left message informing pt.

## 2013-02-24 NOTE — Telephone Encounter (Signed)
Ok to write? 

## 2013-04-05 ENCOUNTER — Telehealth: Payer: Self-pay | Admitting: Internal Medicine

## 2013-04-05 NOTE — Telephone Encounter (Signed)
Pt is scheduled for March 18 at 10:00.

## 2013-04-05 NOTE — Telephone Encounter (Signed)
Pt would like to be worked in today for a bad cough.

## 2013-04-05 NOTE — Telephone Encounter (Signed)
Either 4:45 pm today or tomorrow ( NP Student will be here 3/18 so can double book CPXs)

## 2013-04-06 ENCOUNTER — Ambulatory Visit (INDEPENDENT_AMBULATORY_CARE_PROVIDER_SITE_OTHER): Payer: Managed Care, Other (non HMO) | Admitting: Internal Medicine

## 2013-04-06 ENCOUNTER — Encounter: Payer: Self-pay | Admitting: Internal Medicine

## 2013-04-06 VITALS — BP 118/70 | HR 88 | Temp 97.4°F | Resp 15 | Wt 125.6 lb

## 2013-04-06 DIAGNOSIS — J209 Acute bronchitis, unspecified: Secondary | ICD-10-CM

## 2013-04-06 MED ORDER — HYDROCODONE-HOMATROPINE 5-1.5 MG/5ML PO SYRP: 5.0000 mL | ORAL_SOLUTION | Freq: Four times a day (QID) | ORAL | Status: DC | PRN

## 2013-04-06 MED ORDER — AZITHROMYCIN 250 MG PO TABS: ORAL_TABLET | ORAL | Status: DC

## 2013-04-06 NOTE — Patient Instructions (Signed)
Carry room temperature water and sip liberally after coughing. Natural interventions to treat or prevent constipation would include drinking to thirst, up to 32 ounces of fluids daily; eating 7-9 servings of fresh fruits or vegetables a day; and increasing roughage in the diet such as whole grains. The OTC  fiber products (Example: Metamucil, etc) can be employed if these natural maneuvers do not correct the issue. Finally MiraLax every third day as needed is an option.

## 2013-04-06 NOTE — Progress Notes (Signed)
   Subjective:    Patient ID: Olivia Evans, female    DOB: 1926/12/02, 80 y.o.   MRN: [id number]  HPI  Her symptoms began 03/30/13 and has progressed. The major symptoms are a cough productive of green/gray thick sputum. This has been associated with paroxysmal coughing which has resulted and thoracic rib cage and stomach muscle discomfort. She also has shortness of breath with the paroxysms of cough  Mucinex DM caused constipation  She remains weak; but she feels the cough has improved slightly     Review of Systems She specifically  denies extrinsic symptoms of itchy, watery eyes. She is not having fever, chills, or sweats  Although she has a thoracic pain she is not having pleuritic type chest pain  She denies significant frontal headache, facial pain, dental pain, or discolored nasal secretions  She's had no wheezing with cough.  She has no otic pain or otic discharge.     Objective:   Physical Exam  General appearance:good health ;well nourished; no acute distress or increased work of breathing is present.  No  lymphadenopathy about the head, neck, or axilla noted.   Eyes: No conjunctival inflammation or lid edema is present. .  Ears:  External ear exam shows no significant lesions or deformities.  Otoscopic examination reveals clear canals, tympanic membranes are intact bilaterally without bulging, retraction, inflammation or discharge.  Nose:  External nasal examination shows no deformity or inflammation. Nasal mucosa are pink and moist without lesions or exudates. No septal dislocation or deviation.No obstruction to airflow.   Oral exam: Dental hygiene is good; upper plate. The lips and gums are healthy appearing.There is no oropharyngeal erythema or exudate noted.   Neck:  No deformities,  masses, or tenderness noted.   Supple with full range of motion without pain.   Heart:  Normal rate and regular rhythm. S1 and S2 normal without gallop, murmur, click, rub or other  extra sounds.   Lungs:Chest clear to auscultation; no wheezes, rhonchi,rales ,or rubs present.No increased work of breathing.    Extremities:  No cyanosis, edema, or clubbing  noted    Skin: Warm & dry          Assessment & Plan:  #1 acute bronchitis w/o bronchospasm #2 No URI Plan: See orders and recommendations

## 2013-04-06 NOTE — Progress Notes (Signed)
Pre visit review using our clinic review tool, if applicable. No additional management support is needed unless otherwise documented below in the visit note. 

## 2013-11-15 ENCOUNTER — Other Ambulatory Visit (INDEPENDENT_AMBULATORY_CARE_PROVIDER_SITE_OTHER): Payer: Managed Care, Other (non HMO)

## 2013-11-15 ENCOUNTER — Ambulatory Visit (INDEPENDENT_AMBULATORY_CARE_PROVIDER_SITE_OTHER): Payer: Managed Care, Other (non HMO) | Admitting: Internal Medicine

## 2013-11-15 ENCOUNTER — Encounter: Payer: Self-pay | Admitting: Internal Medicine

## 2013-11-15 VITALS — BP 160/82 | HR 86 | Temp 97.7°F | Resp 15 | Ht 66.0 in | Wt 122.1 lb

## 2013-11-15 DIAGNOSIS — E559 Vitamin D deficiency, unspecified: Secondary | ICD-10-CM

## 2013-11-15 DIAGNOSIS — Z23 Encounter for immunization: Secondary | ICD-10-CM

## 2013-11-15 DIAGNOSIS — E785 Hyperlipidemia, unspecified: Secondary | ICD-10-CM

## 2013-11-15 DIAGNOSIS — Z86010 Personal history of colonic polyps: Secondary | ICD-10-CM

## 2013-11-15 DIAGNOSIS — K5730 Diverticulosis of large intestine without perforation or abscess without bleeding: Secondary | ICD-10-CM

## 2013-11-15 DIAGNOSIS — R030 Elevated blood-pressure reading, without diagnosis of hypertension: Secondary | ICD-10-CM

## 2013-11-15 LAB — CBC WITH DIFFERENTIAL/PLATELET
Basophils Absolute: 0.1 10*3/uL (ref 0.0–0.1)
Basophils Relative: 1.5 % (ref 0.0–3.0)
EOS ABS: 0.3 10*3/uL (ref 0.0–0.7)
EOS PCT: 5.7 % — AB (ref 0.0–5.0)
HEMATOCRIT: 37.3 % (ref 36.0–46.0)
Hemoglobin: 12.1 g/dL (ref 12.0–15.0)
LYMPHS ABS: 1.8 10*3/uL (ref 0.7–4.0)
Lymphocytes Relative: 34.5 % (ref 12.0–46.0)
MCHC: 32.5 g/dL (ref 30.0–36.0)
MCV: 100.1 fl — AB (ref 78.0–100.0)
MONO ABS: 0.3 10*3/uL (ref 0.1–1.0)
Monocytes Relative: 5.4 % (ref 3.0–12.0)
NEUTROS PCT: 52.9 % (ref 43.0–77.0)
Neutro Abs: 2.7 10*3/uL (ref 1.4–7.7)
PLATELETS: 231 10*3/uL (ref 150.0–400.0)
RBC: 3.72 Mil/uL — ABNORMAL LOW (ref 3.87–5.11)
RDW: 14.3 % (ref 11.5–15.5)
WBC: 5.2 10*3/uL (ref 4.0–10.5)

## 2013-11-15 LAB — BASIC METABOLIC PANEL
BUN: 13 mg/dL (ref 6–23)
CHLORIDE: 104 meq/L (ref 96–112)
CO2: 26 meq/L (ref 19–32)
CREATININE: 0.8 mg/dL (ref 0.4–1.2)
Calcium: 10.2 mg/dL (ref 8.4–10.5)
GFR: 75.59 mL/min (ref >60.00)
GLUCOSE: 90 mg/dL (ref 70–99)
POTASSIUM: 3.8 meq/L (ref 3.5–5.1)
Sodium: 139 mEq/L (ref 135–145)

## 2013-11-15 LAB — VITAMIN D 25 HYDROXY (VIT D DEFICIENCY, FRACTURES): VITD: 45.56 ng/mL (ref 30.00–100.00)

## 2013-11-15 LAB — TSH: TSH: 1.15 u[IU]/mL (ref 0.35–4.50)

## 2013-11-15 NOTE — Progress Notes (Signed)
   Subjective:    Patient ID: Olivia Evans, female    DOB: 08-08-1926, 80 y.o.   MRN: [id number]  HPI She is here to assess active health issues & conditions. PMH, FH, & Social history verified & updated   No CVE; heart healthy diet followed.  She continues to have intermittent blood in the urine microscopically. She is followed by the urologist for her interstitial cystitis  She sees her gynecologist annually.   Review of Systems   She does describe some leg cramps, mainly at night.  Unexplained weight loss, abdominal pain, significant dyspepsia, dysphagia, melena, rectal bleeding, or persistently small caliber stools are denied.     Objective:   Physical Exam   Pertinent or positive findings include: She is thin but appears adequately nourished in no distress  She has an upper plate. The nasal symptoms is slightly deviated to the left She has decreased hearing, greater on the left than the right. The second heart sound is accentuated. The aorta is palpable but there is no aneurysm She has crepitus in the knees without effusion.   Gen.:  Alert, appropriate and cooperative throughout exam. Appears younger than stated age  Head: Normocephalic without obvious abnormalities  Eyes: No corneal or conjunctival inflammation noted. Pupils equal round reactive to light and accommodation. Extraocular motion intact.  Ears: External  ear exam reveals no significant lesions or deformities. Canals clear .TMs normal.  Nose: External nasal exam reveals no deformity or inflammation. Nasal mucosa are pink and moist. No lesions or exudates noted.   Mouth: Oral mucosa and oropharynx reveal no lesions or exudates. Teeth in good repair. Neck: No deformities, masses, or tenderness noted. Range of motion decreased. Thyroid small & asymmetric. Lungs: Normal respiratory effort; chest expands symmetrically. Lungs are clear to auscultation without rales, wheezes, or increased work of breathing. Heart:  Normal rate and rhythm. Normal S1 . No gallop, click, or rub. No murmur. Repeat BP 120/70 Abdomen: Bowel sounds normal; abdomen soft and nontender. No masses, organomegaly or hernias noted. Genitalia: as per Gyn                                  Musculoskeletal/extremities: No deformity or scoliosis noted of  the thoracic or lumbar spine.  No clubbing, cyanosis, edema, or significant extremity  deformity noted. Range of motion normal .Tone & strength normal. Hand joints normal Fingernail health good. Able to lie down & sit up w/o help. Negative SLR bilaterally Vascular: Carotid, radial artery, dorsalis pedis and  posterior tibial pulses are full and equal. No bruits present. Neurologic: Alert and oriented x3. Deep tendon reflexes symmetrical and normal.  Gait normal .     Skin: Intact without suspicious lesions or rashes. Lymph: No cervical, axillary lymphadenopathy present. Psych: Mood and affect are normal. Normally interactive                                                                                        Assessment & Plan:  See Current Assessment & Plan in Problem List under specific Diagnosis

## 2013-11-15 NOTE — Progress Notes (Signed)
Pre visit review using our clinic review tool, if applicable. No additional management support is needed unless otherwise documented below in the visit note. 

## 2013-11-15 NOTE — Assessment & Plan Note (Signed)
BMET  TSH

## 2013-11-15 NOTE — Patient Instructions (Signed)
Your next office appointment will be determined based upon review of your pending labs . Those instructions will be transmitted to you through My Chart   Followup as needed for your acute issue. Please report any significant change in your symptoms. Please perform isometric exercises before going to bed. Sit on side of the bed and raise up on toes to a count of 5. Then put pressure on the heels to a count of 5. Repeat this process 10 times. This will improve blood flow to the calves & help prevent cramps.

## 2013-11-15 NOTE — Assessment & Plan Note (Signed)
CBC

## 2013-11-15 NOTE — Assessment & Plan Note (Signed)
Vitamin D level 

## 2013-11-28 IMAGING — CR DG SHOULDER 2+V PORT*R*
1 series · 1 of 1 positions shown · non-contrast
Comparison: Earlier film of the same day

CLINICAL DATA: Postreduction

PORTABLE RIGHT SHOULDER - 2+ VIEW

[view not recorded]
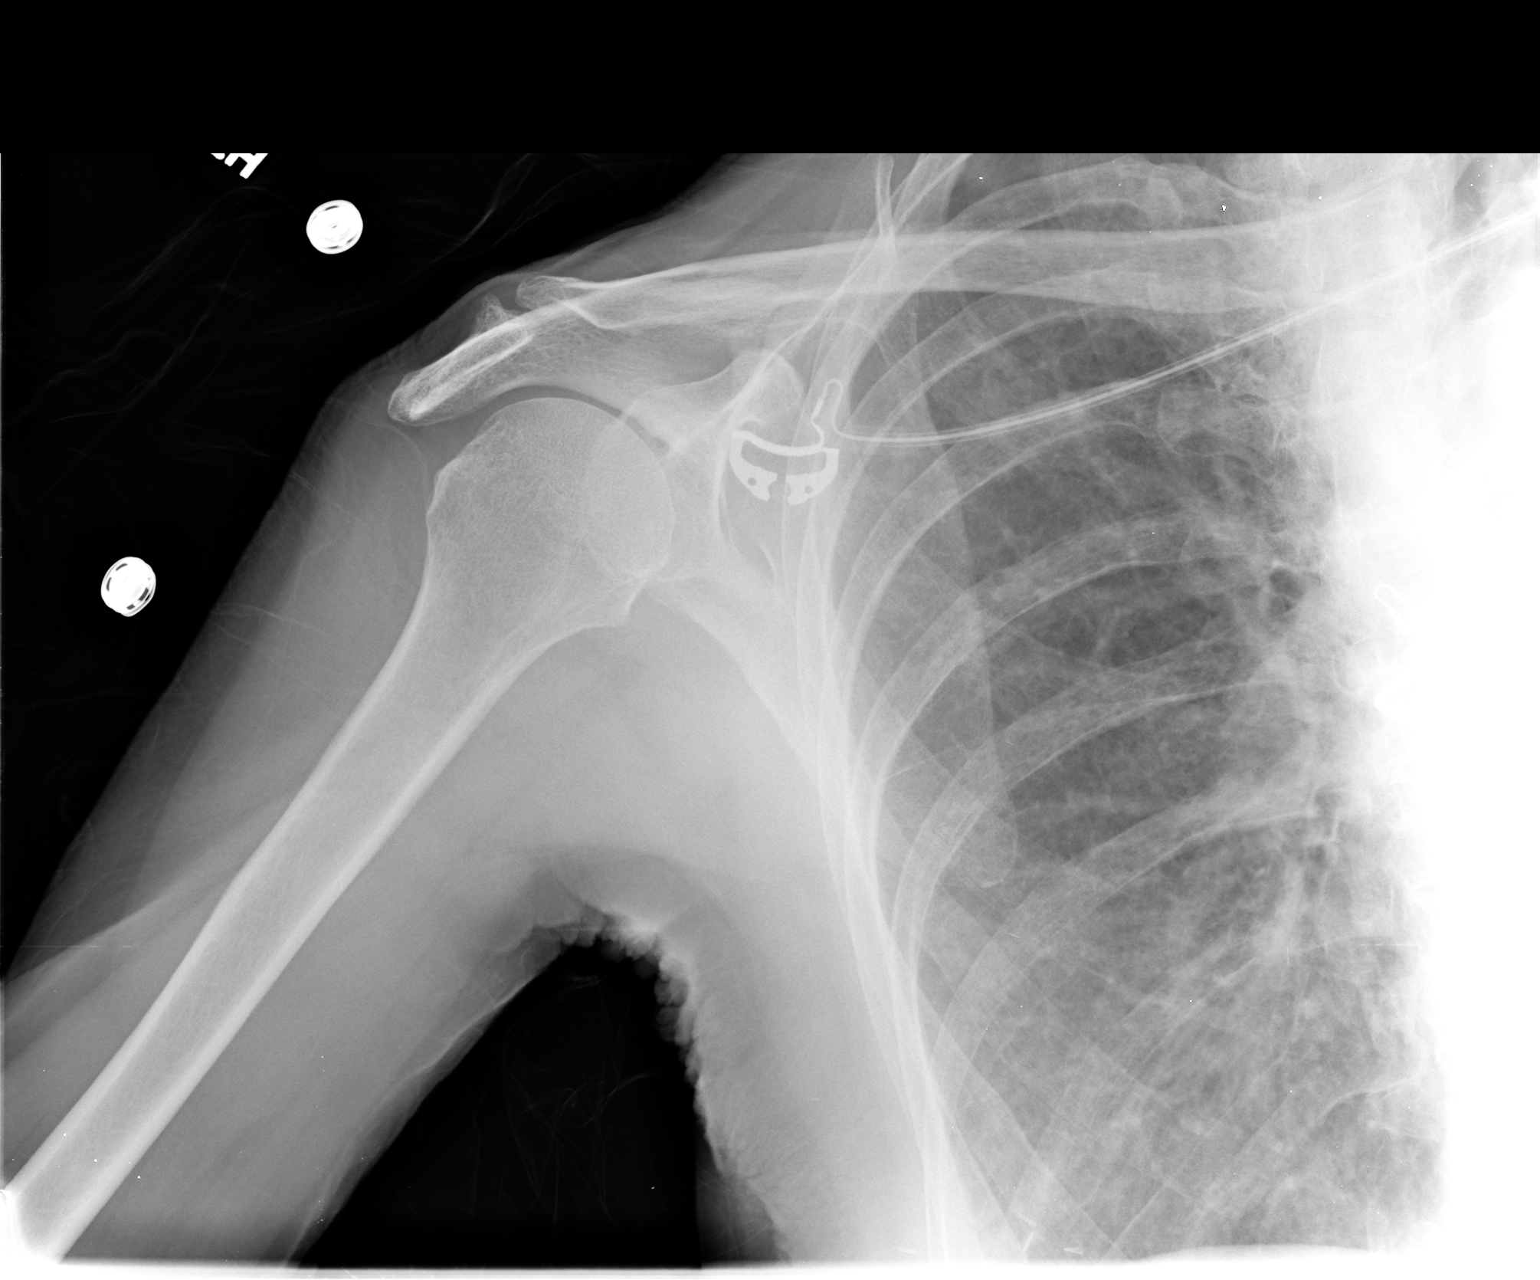

[1 of 1 positions shown; findings below may reference images not displayed]

FINDINGS: The humeral head now projects in the glenoid fossa.  No
fracture or other acute bony abnormalities evident.  Small AC
spurs.  Vascular clips in the right breast.
IMPRESSION: 1.  Apparent reduction of previously noted right shoulder
dislocation.

## 2013-11-28 IMAGING — CR DG SHOULDER 2+V*R*
2 series · 2 of 2 positions shown · non-contrast
Comparison: Right shoulder MRI dated 10/07/2004.

CLINICAL DATA: History of fall this morning with a obvious
deformity of the right shoulder.

RIGHT SHOULDER - 2+ VIEW

[view not recorded (1 of 2)]
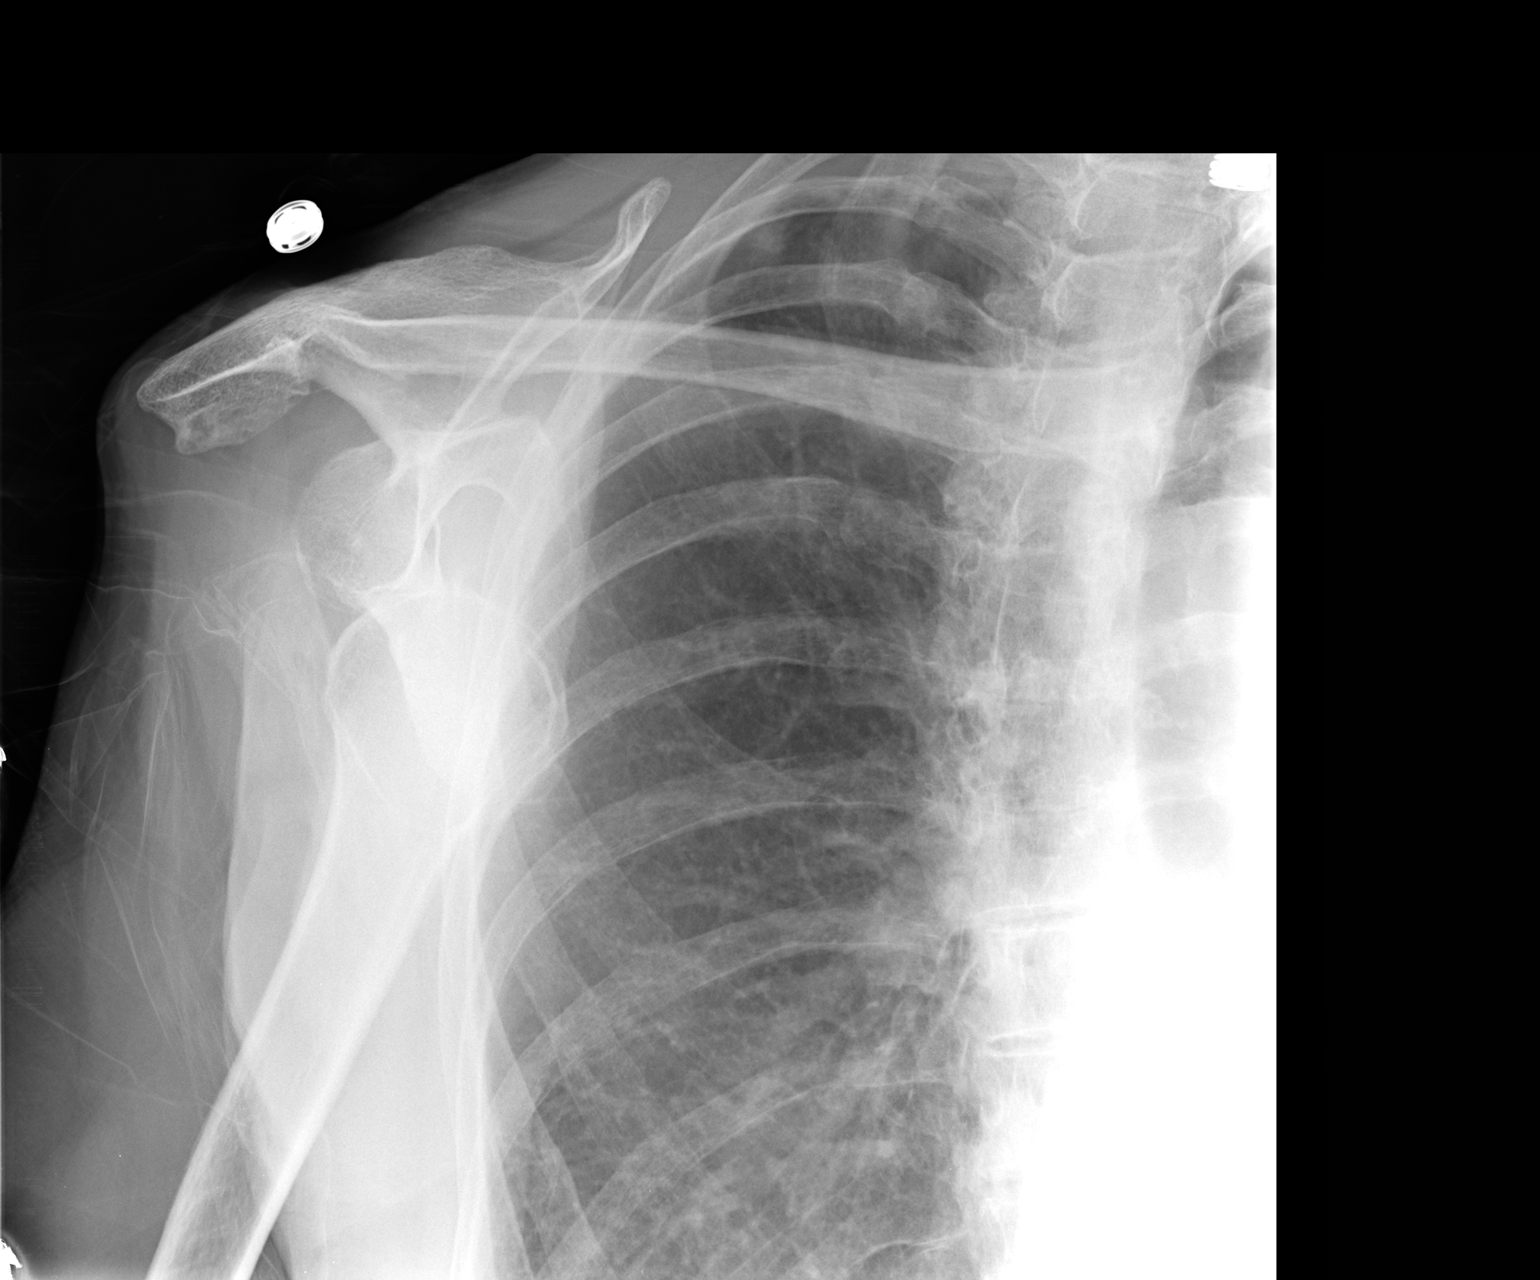

[view not recorded (2 of 2)]
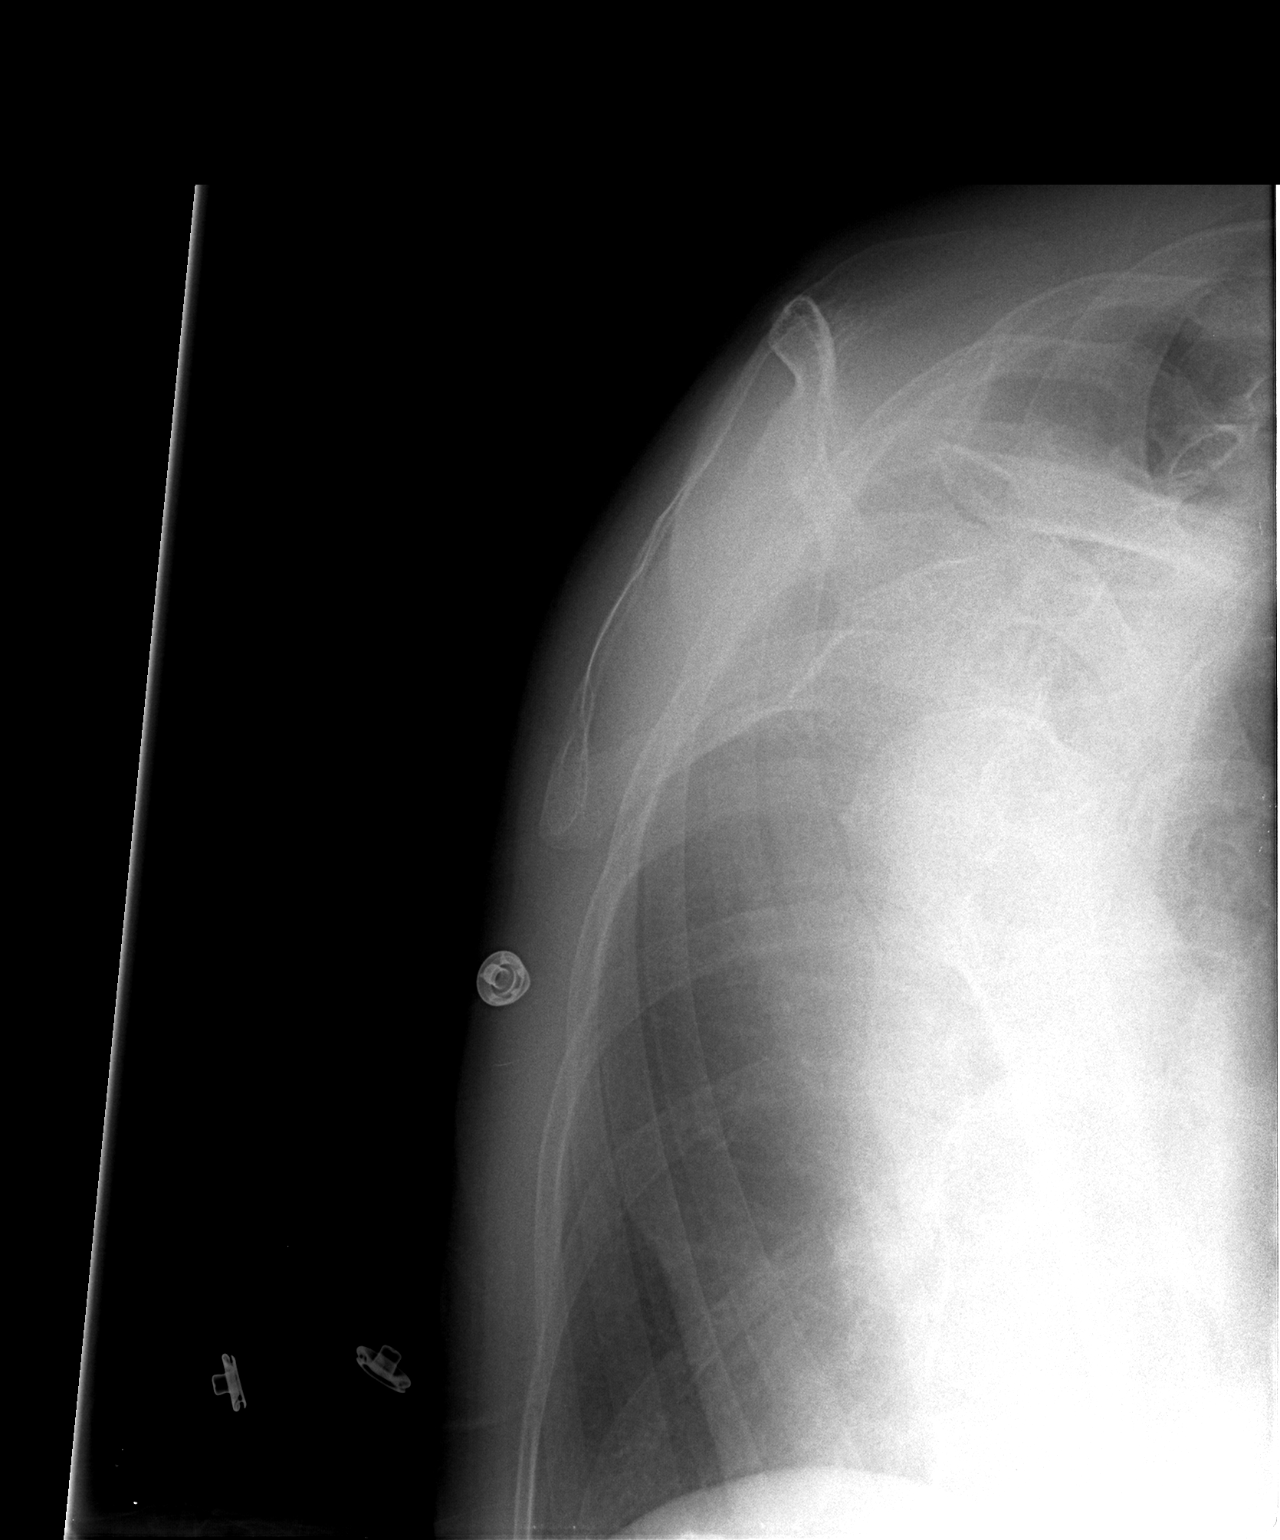

[2 of 2 positions shown; findings below may reference images not displayed]

FINDINGS: Two views of the right shoulder demonstrate an
anterior/inferior dislocation.  The posterior/superior aspect of
the humeral head appears impacted upon the overlying inferior rim
of the glenoid.  Acromioclavicular joint osteoarthritis is also
noted.
IMPRESSION: 1.  Anterior/inferior right shoulder dislocation, as above.

## 2014-02-20 HISTORY — PX: CATARACT EXTRACTION W/ INTRAOCULAR LENS  IMPLANT, BILATERAL: SHX1307

## 2014-05-26 ENCOUNTER — Other Ambulatory Visit: Payer: Self-pay | Admitting: Orthopaedic Surgery

## 2014-05-26 DIAGNOSIS — M542 Cervicalgia: Secondary | ICD-10-CM

## 2014-06-14 ENCOUNTER — Ambulatory Visit
Admission: RE | Admit: 2014-06-14 | Discharge: 2014-06-14 | Disposition: A | Discharge status: Home / Self Care | Payer: PPO | Source: Ambulatory Visit | Attending: Orthopaedic Surgery | Admitting: Orthopaedic Surgery

## 2014-06-14 DIAGNOSIS — M542 Cervicalgia: Secondary | ICD-10-CM

## 2014-06-28 ENCOUNTER — Encounter: Payer: Self-pay | Admitting: Gastroenterology

## 2014-07-17 ENCOUNTER — Other Ambulatory Visit: Payer: Self-pay

## 2014-08-14 ENCOUNTER — Other Ambulatory Visit: Payer: Self-pay | Admitting: Urology

## 2014-08-23 ENCOUNTER — Encounter (HOSPITAL_BASED_OUTPATIENT_CLINIC_OR_DEPARTMENT_OTHER): Payer: Self-pay | Admitting: *Deleted

## 2014-08-23 NOTE — Progress Notes (Signed)
NPO AFTER MN.  ARRIVE AT 0600.  NEEDS HG.  

## 2014-08-25 NOTE — H&P (Signed)
History of Present Illness   [name] has interstitial cystitis. 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 Oxycodone works better than Dilaudid. She did not want PTNS for nocturia x5. She was here last time for microscopic hematuria. We both agreed not to work it up.    When I saw [name] 2 months ago the Myrbetriq had worn off. I gave her VESIcare and Lisbeth Ply and will reassess in 2 months. Pain prescription given.   Urinalysis: Negative.   Frequency: Stable.   Review of Systems: No change in bowel or neurologic systems.    Past Medical History Problems  1. History of Breast Cancer 2. History of hypercholesterolemia (Z86.39) 3. History of Murmur (R01.1) 4. History of Thrombophlebitis Of Deep Vessels Of The Lower Extremity  Surgical History Problems  1. History of Bladder Irrigation 2. History of Bladder Irrigation 3. History of Breast Surgery Lumpectomy 4. History of Cystoscopy With Biopsy 5. History of Cystoscopy With Dilation Of Bladder 6. History of Cystoscopy With Dilation Of Bladder 7. History of Cystoscopy With Dilation Of Bladder 8. History of Cystoscopy With Fulguration 9. 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 History of Hysterectomy 11. History of Shoulder Surgery 12. History of Tubal Ligation 13. History of Varicose Vein Ligation  Current Meds 1. Ciprofloxacin HCl - 250 MG Oral Tablet; TAKE 1 TABLET BID;  Therapy: 20Dec2013 to (Evaluate:27Dec2013)  Requested for: 20Dec2013; Last  Rx:20Dec2013 Ordered 2. Gabapentin TABS;  Therapy: (Recorded:17Jun2010) to Recorded 3. Hydrochlorothiazide 12.5 MG Oral Tablet;  Therapy: (Recorded:28Oct2014) to Recorded 4. HYDROmorphone HCl - 2 MG Oral Tablet; TAKE 1 TABLET Every twelve hours;  Therapy: [id number] to (Evaluate:28Jan2015); Last Rx:08Jan2015 Ordered 5. MiraLax POWD;  Therapy: (POEUMPNT:61WER1540) to Recorded 6. Myrbetriq 25 MG Oral Tablet Extended Release 24 Hour; Take 1 tablet daily;  Therapy:  20Dec2013 to (Evaluate:15Dec2014); Last Rx:20Dec2013 Ordered 7. Myrbetriq 50 MG Oral Tablet Extended Release 24 Hour; Take 1 tablet daily;  Therapy: 24Apr2014 to (Evaluate:19Apr2015); Last Rx:24Apr2014 Ordered 8. Oxycodone-Acetaminophen 7.5-325 MG Oral Tablet; TAKE 1 TABLET EVERY 4 TO 6  HOURS AS NEEDED FOR PAIN;  Therapy: 08QPY1950 to (Evaluate:27May2016); Last Rx:20May2016 Ordered 9. Oxycodone-Acetaminophen 7.5-325 MG Oral Tablet; TAKE 1 TABLET EVERY 4 TO 6  HOURS AS NEEDED FOR PAIN;  Therapy: 93OIZ1245 to (Evaluate:03Nov2015); Last Rx:27Oct2015 Ordered 10. Toviaz 4 MG Oral Tablet Extended Release 24 Hour; TAKE 1 TABLET DAILY;   Therapy: 80DXI3382 to (Evaluate:15May2017); Last Rx:20May2016 Ordered 11. Tylenol Arthritis Pain 650 MG Oral Tablet Extended Release;   Therapy: 25Apr2013 to Recorded 12. VESIcare 5 MG Oral Tablet; a tablet daily for 30 days;   Therapy: 50NLZ7673 to (Last Rx:20May2016) Ordered 13. Vitamin D3 CAPS;   Therapy: (Recorded:28Oct2014) to Recorded  Allergies Medication  1. Neurontin TABS 2. Premarin TABS 3. Simvastatin TABS 4. Vicodin TABS  Family History Problems  1. Family history of Family Health Status Number Of Children   4 children  Social History Problems  1. Activities Of Daily Living 2. Denied: History of Alcohol Use 3. Caffeine Use   1-2 4. Exercise Habits   no reg exer but is active at home with alll chores. 5. Denied: History of Former Smoker 6. Marital History - Widowed 7. Never A Smoker 8. Occupation:   retired 65. Self-reliant In Usual Daily Activities  Results/Data  Urine [Data Includes: Last 1 Day]   [id number]  COLOR YELLOW   APPEARANCE CLEAR   SPECIFIC GRAVITY 1.025   pH 6.0   GLUCOSE NEG mg/dL  BILIRUBIN NEG   KETONE  NEG mg/dL  BLOOD MOD   PROTEIN 30 mg/dL  UROBILINOGEN 0.2 mg/dL  NITRITE NEG   LEUKOCYTE ESTERASE SMALL   SQUAMOUS EPITHELIAL/HPF MODERATE   WBC 21-50 WBC/hpf  RBC NONE SEEN RBC/hpf  BACTERIA NONE  SEEN   CRYSTALS NONE SEEN   CASTS NONE SEEN    Assessment Assessed  1. Increased urinary frequency (R35.0) 2. Chronic interstitial cystitis without hematuria (N30.10)  Plan Chronic interstitial cystitis without hematuria  1. Follow-up Schedule Surgery Office  Follow-up  Status: Complete  Done: [id number] Gross hematuria, Increased urinary frequency  2. URINE CULTURE; Status:In Progress - Specimen/Data Collected;   Done: [id number] Gross hematuria, Nocturia  3. Renew: Oxycodone-Acetaminophen 7.5-325 MG Oral Tablet; TAKE 1 TABLET EVERY 4  TO 6 HOURS AS NEEDED FOR PAIN  Discussion/Summary   [name] is really having a lot of pain. She wants another hydrodistention. It can be quite significant. There are no other modifying factors, associated signs or symptoms. There are no other aggravating or relieving factors. Presentation is moderate in severity and ongoing.   I will send her urine for culture recognizing it is likely not positive. She would like another hydrodistention and possible fulguration of any ulcers. She said each time I have done this it has helped except once. The last time I did this was in 2013. I thought this was very reasonable. We are going to give her #40 pills of oxycodone. Prescription given.   After a thorough review of the management options for the patient's condition the patient  elected to proceed with surgical therapy as noted above. We have discussed the potential benefits and risks of the procedure, side effects of the proposed treatment, the likelihood of the patient achieving the goals of the procedure, and any potential problems that might occur during the procedure or recuperation. Informed consent has been obtained.

## 2014-08-27 ENCOUNTER — Encounter (HOSPITAL_BASED_OUTPATIENT_CLINIC_OR_DEPARTMENT_OTHER): Payer: Self-pay | Admitting: Anesthesiology

## 2014-08-27 NOTE — Anesthesia Preprocedure Evaluation (Addendum)
Anesthesia Evaluation  Patient identified by MRN, date of birth, ID band Patient awake    Reviewed: Allergy & Precautions, NPO status , Patient's Chart, lab work & pertinent test results  Airway Mallampati: II  TM Distance: >3 FB Neck ROM: Full    Dental no notable dental hx.    Pulmonary neg pulmonary ROS,  breath sounds clear to auscultation  Pulmonary exam normal       Cardiovascular negative cardio ROS Normal cardiovascular exam+ dysrhythmias + Valvular Problems/Murmurs Rhythm:Regular Rate:Normal     Neuro/Psych negative neurological ROS  negative psych ROS   GI/Hepatic negative GI ROS, Neg liver ROS,   Endo/Other  negative endocrine ROS  Renal/GU negative Renal ROS  negative genitourinary   Musculoskeletal  (+) Arthritis -,   Abdominal   Peds negative pediatric ROS (+)  Hematology negative hematology ROS (+)   Anesthesia Other Findings   Reproductive/Obstetrics negative OB ROS                             Anesthesia Physical Anesthesia Plan  ASA: II  Anesthesia Plan: General   Post-op Pain Management:    Induction: Intravenous  Airway Management Planned: LMA  Additional Equipment:   Intra-op Plan:   Post-operative Plan: Extubation in OR  Informed Consent: I have reviewed the patients History and Physical, chart, labs and discussed the procedure including the risks, benefits and alternatives for the proposed anesthesia with the patient or authorized representative who has indicated his/her understanding and acceptance.   Dental advisory given  Plan Discussed with: CRNA  Anesthesia Plan Comments:         Anesthesia Quick Evaluation

## 2014-08-28 ENCOUNTER — Encounter (HOSPITAL_BASED_OUTPATIENT_CLINIC_OR_DEPARTMENT_OTHER)
Admission: RE | Disposition: A | Discharge status: Home / Self Care | Payer: Self-pay | Source: Ambulatory Visit | Attending: Urology

## 2014-08-28 ENCOUNTER — Ambulatory Visit (HOSPITAL_BASED_OUTPATIENT_CLINIC_OR_DEPARTMENT_OTHER): Payer: PPO | Admitting: Anesthesiology

## 2014-08-28 ENCOUNTER — Encounter (HOSPITAL_BASED_OUTPATIENT_CLINIC_OR_DEPARTMENT_OTHER): Payer: Self-pay | Admitting: *Deleted

## 2014-08-28 ENCOUNTER — Ambulatory Visit (HOSPITAL_BASED_OUTPATIENT_CLINIC_OR_DEPARTMENT_OTHER)
Admission: RE | Admit: 2014-08-28 | Discharge: 2014-08-28 | Disposition: A | Discharge status: Home / Self Care | Payer: PPO | Source: Ambulatory Visit | Attending: Urology | Admitting: Urology

## 2014-08-28 DIAGNOSIS — Z79899 Other long term (current) drug therapy: Secondary | ICD-10-CM | POA: Insufficient documentation

## 2014-08-28 DIAGNOSIS — N3010 Interstitial cystitis (chronic) without hematuria: Secondary | ICD-10-CM | POA: Diagnosis present

## 2014-08-28 DIAGNOSIS — M1990 Unspecified osteoarthritis, unspecified site: Secondary | ICD-10-CM | POA: Insufficient documentation

## 2014-08-28 DIAGNOSIS — Z853 Personal history of malignant neoplasm of breast: Secondary | ICD-10-CM | POA: Diagnosis not present

## 2014-08-28 DIAGNOSIS — E780 Pure hypercholesterolemia: Secondary | ICD-10-CM | POA: Diagnosis not present

## 2014-08-28 DIAGNOSIS — Z79891 Long term (current) use of opiate analgesic: Secondary | ICD-10-CM | POA: Insufficient documentation

## 2014-08-28 HISTORY — DX: Personal history of other specified conditions: Z87.898

## 2014-08-28 HISTORY — DX: Hyperlipidemia, unspecified: E78.5

## 2014-08-28 HISTORY — DX: Reserved for inherently not codable concepts without codable children: IMO0001

## 2014-08-28 HISTORY — DX: Presence of spectacles and contact lenses: Z97.3

## 2014-08-28 HISTORY — DX: Unspecified right bundle-branch block: I45.10

## 2014-08-28 HISTORY — DX: Other constipation: K59.09

## 2014-08-28 HISTORY — DX: Personal history of adenomatous and serrated colon polyps: Z86.0101

## 2014-08-28 HISTORY — DX: Unspecified symptoms and signs involving the genitourinary system: R39.9

## 2014-08-28 HISTORY — DX: Personal history of colonic polyps: Z86.010

## 2014-08-28 HISTORY — DX: Presence of dental prosthetic device (complete) (partial): Z97.2

## 2014-08-28 HISTORY — PX: CYSTO WITH HYDRODISTENSION: SHX5453

## 2014-08-28 SURGERY — CYSTOSCOPY, WITH BLADDER HYDRODISTENSION
Anesthesia: General | Site: Bladder

## 2014-08-28 MED ORDER — PROPOFOL 10 MG/ML IV BOLUS
INTRAVENOUS | Status: DC | PRN
  Administered 2014-08-28: 120 mg via INTRAVENOUS

## 2014-08-28 MED ORDER — CIPROFLOXACIN IN D5W 400 MG/200ML IV SOLN
INTRAVENOUS | Status: AC
  Filled 2014-08-28: qty 200

## 2014-08-28 MED ORDER — DEXAMETHASONE SODIUM PHOSPHATE 4 MG/ML IJ SOLN
INTRAMUSCULAR | Status: DC | PRN
  Administered 2014-08-28: 4 mg via INTRAVENOUS

## 2014-08-28 MED ORDER — ONDANSETRON HCL 4 MG/2ML IJ SOLN
INTRAMUSCULAR | Status: DC | PRN
  Administered 2014-08-28: 4 mg via INTRAVENOUS

## 2014-08-28 MED ORDER — STERILE WATER FOR IRRIGATION IR SOLN
Status: DC | PRN
  Administered 2014-08-28 (×2): 3000 mL

## 2014-08-28 MED ORDER — FENTANYL CITRATE (PF) 100 MCG/2ML IJ SOLN
25.0000 ug | INTRAMUSCULAR | Status: DC | PRN
  Filled 2014-08-28: qty 1

## 2014-08-28 MED ORDER — OXYCODONE HCL 5 MG PO TABS
5.0000 mg | ORAL_TABLET | Freq: Once | ORAL | Status: AC
  Administered 2014-08-28: 5 mg via ORAL
  Filled 2014-08-28: qty 1

## 2014-08-28 MED ORDER — FENTANYL CITRATE (PF) 100 MCG/2ML IJ SOLN
INTRAMUSCULAR | Status: AC
  Filled 2014-08-28: qty 4

## 2014-08-28 MED ORDER — FENTANYL CITRATE (PF) 100 MCG/2ML IJ SOLN
INTRAMUSCULAR | Status: DC | PRN
  Administered 2014-08-28 (×4): 25 ug via INTRAVENOUS

## 2014-08-28 MED ORDER — LACTATED RINGERS IV SOLN
INTRAVENOUS | Status: DC
  Administered 2014-08-28: 07:00:00 via INTRAVENOUS
  Filled 2014-08-28: qty 1000

## 2014-08-28 MED ORDER — OXYCODONE HCL 5 MG PO TABS
ORAL_TABLET | ORAL | Status: AC
  Filled 2014-08-28: qty 1

## 2014-08-28 MED ORDER — HYDROCODONE-ACETAMINOPHEN 5-325 MG PO TABS: 1.0000 | ORAL_TABLET | Freq: Four times a day (QID) | ORAL | Status: DC | PRN

## 2014-08-28 MED ORDER — LIDOCAINE HCL (CARDIAC) 20 MG/ML IV SOLN
INTRAVENOUS | Status: DC | PRN
  Administered 2014-08-28: 40 mg via INTRAVENOUS

## 2014-08-28 MED ORDER — ACETAMINOPHEN 10 MG/ML IV SOLN
INTRAVENOUS | Status: DC | PRN
  Administered 2014-08-28: 1000 mg via INTRAVENOUS

## 2014-08-28 MED ORDER — CIPROFLOXACIN IN D5W 400 MG/200ML IV SOLN
400.0000 mg | INTRAVENOUS | Status: AC
  Administered 2014-08-28: 400 mg via INTRAVENOUS
  Filled 2014-08-28: qty 200

## 2014-08-28 SURGICAL SUPPLY — 25 items
BAG DRAIN URO-CYSTO SKYTR STRL (DRAIN) ×3 IMPLANT
BAG DRN UROCATH (DRAIN) ×1
CANISTER SUCT LVC 12 LTR MEDI- (MISCELLANEOUS) ×2 IMPLANT
CATH FOLEY 2WAY SLVR  5CC 18FR (CATHETERS)
CATH FOLEY 2WAY SLVR 5CC 18FR (CATHETERS) IMPLANT
CATH ROBINSON RED A/P 12FR (CATHETERS) IMPLANT
CATH ROBINSON RED A/P 14FR (CATHETERS) IMPLANT
CLOTH BEACON ORANGE TIMEOUT ST (SAFETY) ×3 IMPLANT
ELECT REM PT RETURN 9FT ADLT (ELECTROSURGICAL) ×3
ELECTRODE REM PT RTRN 9FT ADLT (ELECTROSURGICAL) IMPLANT
GLOVE BIO SURGEON STRL SZ 6.5 (GLOVE) ×1 IMPLANT
GLOVE BIO SURGEON STRL SZ7.5 (GLOVE) ×3 IMPLANT
GLOVE BIO SURGEONS STRL SZ 6.5 (GLOVE) ×1
GLOVE BIOGEL PI IND STRL 6.5 (GLOVE) IMPLANT
GLOVE BIOGEL PI INDICATOR 6.5 (GLOVE) ×4
GOWN STRL REUS W/ TWL XL LVL3 (GOWN DISPOSABLE) ×1 IMPLANT
GOWN STRL REUS W/TWL LRG LVL3 (GOWN DISPOSABLE) ×2 IMPLANT
GOWN STRL REUS W/TWL XL LVL3 (GOWN DISPOSABLE) ×3
MANIFOLD NEPTUNE II (INSTRUMENTS) IMPLANT
NDL SAFETY ECLIPSE 18X1.5 (NEEDLE) ×1 IMPLANT
NEEDLE HYPO 18GX1.5 SHARP (NEEDLE) ×3
PACK CYSTO (CUSTOM PROCEDURE TRAY) ×3 IMPLANT
SUT SILK 0 TIES 10X30 (SUTURE) IMPLANT
SYR 20CC LL (SYRINGE) ×3 IMPLANT
WATER STERILE IRR 3000ML UROMA (IV SOLUTION) ×5 IMPLANT

## 2014-08-28 NOTE — Discharge Instructions (Addendum)
I have reviewed discharge instructions in detail with the patient. They will follow-up with me or their physician as scheduled. My nurse will also be calling the patients as per protocol.   CYSTOSCOPY HOME CARE INSTRUCTIONS  Activity: Rest for the remainder of the day.  Do not drive or operate equipment today.  You may resume normal activities in one to two days as instructed by your physician.   Meals: Drink plenty of liquids and eat light foods such as gelatin or soup this evening.  You may return to a normal meal plan tomorrow.  Return to Work: You may return to work in one to two days or as instructed by your physician.  Special Instructions / Symptoms: Call your physician if any of these symptoms occur:   -persistent or heavy bleeding  -bleeding which continues after first few urination  -large blood clots that are difficult to pass  -urine stream diminishes or stops completely  -fever equal to or higher than 101 degrees Farenheit.  -cloudy urine with a strong, foul odor  -severe pain  Females should always wipe from front to back after elimination.  You may feel some burning pain when you urinate.  This should disappear with time.  Applying moist heat to the lower abdomen or a hot tub bath may help relieve the pain. \  Follow-Up / Date of Return Visit to Your Physician:  Call for an appointment to arrange follow-up.  Patient Signature:  ________________________________________________________  Nurse's Signature:  ________________________________________________________  Post Anesthesia Home Care Instructions  Activity: Get plenty of rest for the remainder of the day. A responsible adult should stay with you for 24 hours following the procedure.  For the next 24 hours, DO NOT: -Drive a car -Paediatric nurse -Drink alcoholic beverages -Take any medication unless instructed by your physician -Make any legal decisions or sign important papers.  Meals: Start with liquid  foods such as gelatin or soup. Progress to regular foods as tolerated. Avoid greasy, spicy, heavy foods. If nausea and/or vomiting occur, drink only clear liquids until the nausea and/or vomiting subsides. Call your physician if vomiting continues.  Special Instructions/Symptoms: Your throat may feel dry or sore from the anesthesia or the breathing tube placed in your throat during surgery. If this causes discomfort, gargle with warm salt water. The discomfort should disappear within 24 hours.  If you had a scopolamine patch placed behind your ear for the management of post- operative nausea and/or vomiting:  1. The medication in the patch is effective for 72 hours, after which it should be removed.  Wrap patch in a tissue and discard in the trash. Wash hands thoroughly with soap and water. 2. You may remove the patch earlier than 72 hours if you experience unpleasant side effects which may include dry mouth, dizziness or visual disturbances. 3. Avoid touching the patch. Wash your hands with soap and water after contact with the patch.

## 2014-08-28 NOTE — Anesthesia Procedure Notes (Signed)
Procedure Name: LMA Insertion Date/Time: 08/28/2014 7:40 AM Performed by: Bethena Roys T Pre-anesthesia Checklist: Patient identified, Emergency Drugs available, Suction available and Patient being monitored Patient Re-evaluated:Patient Re-evaluated prior to inductionOxygen Delivery Method: Circle System Utilized Preoxygenation: Pre-oxygenation with 100% oxygen Intubation Type: IV induction Ventilation: Mask ventilation without difficulty LMA: LMA inserted LMA Size: 4.0 Number of attempts: 1 Airway Equipment and Method: Bite block Placement Confirmation: positive ETCO2 Tube secured with: Tape Dental Injury: Teeth and Oropharynx as per pre-operative assessment

## 2014-08-28 NOTE — Transfer of Care (Signed)
Immediate Anesthesia Transfer of Care Note  Patient: Olivia Evans  Procedure(s) Performed: Procedure(s): CYSTO/HYDRODISTENSION, FULGURATION OF ULCERS (N/A)  Patient Location: PACU  Anesthesia Type:General  Level of Consciousness: awake and oriented  Airway & Oxygen Therapy: Patient Spontanous Breathing and Patient connected to nasal cannula oxygen  Post-op Assessment: Report given to RN  Post vital signs: Reviewed and stable  Last Vitals:  Filed Vitals:   08/28/14 0620  BP: 134/51  Pulse: 77  Temp: 36.3 C  Resp: 16    Complications: No apparent anesthesia complications

## 2014-08-28 NOTE — Op Note (Signed)
Preoperative diagnosis: Interstitial cystitis and Hunner's ulcers Postoperative diagnosis: Interstitial cystitis and Hunner's ulcers Surgery: Cystoscopy and hydrodistention and fulguration of Hunner's ulcers Surgeon Dr. Nicki Reaper Madia Carvell  The patient has the above diagnoses and consented above procedure. Preoperative antibodies given. She is only hydrodistended to 150 mL recognizing her true bladder capacity and a did not want to cause too much bleeding. 21 [place] scope was utilized.  Her trigone is identified. It looks like she has a large refluxing ureter on the left. She did not have any mucosal ladder mallear erythema at rest in keeping with any diagnosis at his carcinoma in situ. She had scarring of her bladder  After the gentle hydrodistention she diffuse glomerulations and bleeding. For several minutes I gently irrigated her bladder. It was unsafe to fulgurate at around 9 and 10:00 and 2 and 3:00 anteriorly and laterally oozing areas in keeping with a diagnosis of a Hunner's ulcer. I was well away from the floor the bladder and trigone. There is no bladder perforation. I Bladder volumes low  Urine was nearly clear at the end of the case.  Bladder was emptied. Patient taken to recovery

## 2014-08-28 NOTE — Interval H&P Note (Signed)
History and Physical Interval Note:  08/28/2014 7:34 AM  [name]  has presented today for surgery, with the diagnosis of PELVIC PAIN  The various methods of treatment have been discussed with the patient and family. After consideration of risks, benefits and other options for treatment, the patient has consented to  Procedure(s): CYSTO/HYDRODISTENSION, INSTALLATION OF MARCAINE AND PYRIDIUM, POSSIBLE FULGURATION OF ULCERS (N/Evans) as Evans surgical intervention .  The patient's history has been reviewed, patient examined, no change in status, stable for surgery.  I have reviewed the patient's chart and labs.  Questions were answered to the patient's satisfaction.     Olivia Evans

## 2014-08-28 NOTE — Anesthesia Postprocedure Evaluation (Signed)
  Anesthesia Post-op Note  Patient: Olivia Evans  Procedure(s) Performed: Procedure(s) (LRB): CYSTO/HYDRODISTENSION, FULGURATION OF ULCERS (N/A)  Patient Location: PACU  Anesthesia Type: General  Level of Consciousness: awake and alert   Airway and Oxygen Therapy: Patient Spontanous Breathing  Post-op Pain: mild  Post-op Assessment: Post-op Vital signs reviewed, Patient's Cardiovascular Status Stable, Respiratory Function Stable, Patent Airway and No signs of Nausea or vomiting  Last Vitals:  Filed Vitals:   08/28/14 0932  BP: 126/53  Pulse: 74  Temp: 36.9 C  Resp: 16    Post-op Vital Signs: stable   Complications: No apparent anesthesia complications

## 2014-08-29 ENCOUNTER — Encounter (HOSPITAL_BASED_OUTPATIENT_CLINIC_OR_DEPARTMENT_OTHER): Payer: Self-pay | Admitting: Urology

## 2014-08-29 LAB — POCT HEMOGLOBIN-HEMACUE: HEMOGLOBIN: 11.1 g/dL — AB (ref 12.0–15.0)

## 2014-09-11 LAB — HM MAMMOGRAPHY

## 2014-10-16 ENCOUNTER — Telehealth: Payer: Self-pay | Admitting: Internal Medicine

## 2014-10-16 ENCOUNTER — Other Ambulatory Visit: Payer: Self-pay | Admitting: Internal Medicine

## 2014-10-16 ENCOUNTER — Other Ambulatory Visit: Payer: Self-pay | Admitting: Emergency Medicine

## 2014-10-16 MED ORDER — MOMETASONE FUROATE 0.1 % EX OINT: TOPICAL_OINTMENT | Freq: Two times a day (BID) | CUTANEOUS | Status: DC

## 2014-10-16 NOTE — Telephone Encounter (Signed)
Pt has poison oak and she has been using OTC medication for about a week and it seems to be spreading. She is wondering if you can call her in something for this. Pharmacy is [facility] on [address].

## 2014-10-16 NOTE — Telephone Encounter (Signed)
Called pt to notify rx has been faxed in. OV if no better.

## 2014-10-16 NOTE — Telephone Encounter (Signed)
Apply  Generic Elocon twice a day to the rash as needed; not to face OVINB

## 2014-10-16 NOTE — Telephone Encounter (Signed)
Called to inform pt that Antibiotic cream was sent to pharm. OV  If not better.

## 2014-10-30 ENCOUNTER — Telehealth: Payer: Self-pay

## 2014-10-30 NOTE — Telephone Encounter (Signed)
Call to educate regarding AWV; Olivia Evans did agree to come on 10/12 at 11:15 for AWV; Discussed billing and referred to carrier; Stated that medicare generally covered AWV but CPE may be charged if there is more than preventive exam. Referred to carrier for questions and stated x2 that this was voluntary and Dr. [name] could also complete her AWV if she chose to come in, but if she had other issues,  I cannot guarantee she will not have a copay. STated she had one the year before and not last year but made a decision to come in anyway.

## 2014-11-01 ENCOUNTER — Ambulatory Visit (INDEPENDENT_AMBULATORY_CARE_PROVIDER_SITE_OTHER): Payer: PPO

## 2014-11-01 ENCOUNTER — Telehealth: Payer: Self-pay

## 2014-11-01 VITALS — BP 130/60 | HR 68 | Temp 97.7°F | Ht 66.0 in | Wt 121.0 lb

## 2014-11-01 DIAGNOSIS — Z0000 Encounter for general adult medical examination without abnormal findings: Secondary | ICD-10-CM | POA: Diagnosis not present

## 2014-11-01 NOTE — Progress Notes (Signed)
Medical screening examination/treatment/procedure(s) were performed by non-physician practitioner and as supervising physician I was immediately available for consultation/collaboration. I agree with above. Genean Adamski, MD   

## 2014-11-01 NOTE — Telephone Encounter (Signed)
Call to Dr. Neal/ [facility] for [facility] to request fax of mammogram recently completed. LVM on medical records to send to [facility]

## 2014-11-01 NOTE — Patient Instructions (Addendum)
Olivia Evans , Thank you for taking time to come for your Medicare Wellness Visit. I appreciate your ongoing commitment to your health goals. Please review the following plan we discussed and let me know if I can assist you in the future.   Will defer flu shot due to allergy to latex Will defer Prevnar 13 as well due to latex;   Will discuss repeat dexa with Dr. Nori Riis Mammogram completed this year and was normal   These are the goals we discussed: Goals    . patient      Will continue to maintain health as is;        This is a list of the screening recommended for you and due dates:  Health Maintenance  Topic Date Due  . Pneumonia vaccines (2 of 2 - PCV13) 03/31/2012  . Flu Shot  08/21/2014  . Tetanus Vaccine  07/20/2017  . DEXA scan (bone density measurement)  Completed  . Shingles Vaccine  Completed     Fall Prevention in the Home  Falls can cause injuries. They can happen to people of all ages. There are many things you can do to make your home safe and to help prevent falls.  WHAT CAN I DO ON THE OUTSIDE OF MY HOME?  Regularly fix the edges of walkways and driveways and fix any cracks.  Remove anything that might make you trip as you walk through a door, such as a raised step or threshold.  Trim any bushes or trees on the path to your home.  Use bright outdoor lighting.  Clear any walking paths of anything that might make someone trip, such as rocks or tools.  Regularly check to see if handrails are loose or broken. Make sure that both sides of any steps have handrails.  Any raised decks and porches should have guardrails on the edges.  Have any leaves, snow, or ice cleared regularly.  Use sand or salt on walking paths during winter.  Clean up any spills in your garage right away. This includes oil or grease spills. WHAT CAN I DO IN THE BATHROOM?   Use night lights.  Install grab bars by the toilet and in the tub and shower. Do not use towel bars as grab  bars.  Use non-skid mats or decals in the tub or shower.  If you need to sit down in the shower, use a plastic, non-slip stool.  Keep the floor dry. Clean up any water that spills on the floor as soon as it happens.  Remove soap buildup in the tub or shower regularly.  Attach bath mats securely with double-sided non-slip rug tape.  Do not have throw rugs and other things on the floor that can make you trip. WHAT CAN I DO IN THE BEDROOM?  Use night lights.  Make sure that you have a light by your bed that is easy to reach.  Do not use any sheets or blankets that are too big for your bed. They should not hang down onto the floor.  Have a firm chair that has side arms. You can use this for support while you get dressed.  Do not have throw rugs and other things on the floor that can make you trip. WHAT CAN I DO IN THE KITCHEN?  Clean up any spills right away.  Avoid walking on wet floors.  Keep items that you use a lot in easy-to-reach places.  If you need to reach something above you, use a strong  step stool that has a grab bar.  Keep electrical cords out of the way.  Do not use floor polish or wax that makes floors slippery. If you must use wax, use non-skid floor wax.  Do not have throw rugs and other things on the floor that can make you trip. WHAT CAN I DO WITH MY STAIRS?  Do not leave any items on the stairs.  Make sure that there are handrails on both sides of the stairs and use them. Fix handrails that are broken or loose. Make sure that handrails are as long as the stairways.  Check any carpeting to make sure that it is firmly attached to the stairs. Fix any carpet that is loose or worn.  Avoid having throw rugs at the top or bottom of the stairs. If you do have throw rugs, attach them to the floor with carpet tape.  Make sure that you have a light switch at the top of the stairs and the bottom of the stairs. If you do not have them, ask someone to add them for  you. WHAT ELSE CAN I DO TO HELP PREVENT FALLS?  Wear shoes that:  Do not have high heels.  Have rubber bottoms.  Are comfortable and fit you well.  Are closed at the toe. Do not wear sandals.  If you use a stepladder:  Make sure that it is fully opened. Do not climb a closed stepladder.  Make sure that both sides of the stepladder are locked into place.  Ask someone to hold it for you, if possible.  Clearly mark and make sure that you can see:  Any grab bars or handrails.  First and last steps.  Where the edge of each step is.  Use tools that help you move around (mobility aids) if they are needed. These include:  Canes.  Walkers.  Scooters.  Crutches.  Turn on the lights when you go into a dark area. Replace any light bulbs as soon as they burn out.  Set up your furniture so you have a clear path. Avoid moving your furniture around.  If any of your floors are uneven, fix them.  If there are any pets around you, be aware of where they are.  Review your medicines with your doctor. Some medicines can make you feel dizzy. This can increase your chance of falling. Ask your doctor what other things that you can do to help prevent falls.   This information is not intended to replace advice given to you by your health care provider. Make sure you discuss any questions you have with your health care provider.   Document Released: 11/02/2008 Document Revised: 05/23/2014 Document Reviewed: 02/10/2014 Elsevier Interactive Patient Education 2016 [employer]  Health Maintenance, Female Adopting a healthy lifestyle and getting preventive care can go a long way to promote health and wellness. Talk with your health care provider about what schedule of regular examinations is right for you. This is a good chance for you to check in with your provider about disease prevention and staying healthy. In between checkups, there are plenty of things you can do on your own. Experts  have done a lot of research about which lifestyle changes and preventive measures are most likely to keep you healthy. Ask your health care provider for more information. WEIGHT AND DIET  Eat a healthy diet  Be sure to include plenty of vegetables, fruits, low-fat dairy products, and lean protein.  Do not eat a lot of   foods high in solid fats, added sugars, or salt.  Get regular exercise. This is one of the most important things you can do for your health.  Most adults should exercise for at least 150 minutes each week. The exercise should increase your heart rate and make you sweat (moderate-intensity exercise).  Most adults should also do strengthening exercises at least twice a week. This is in addition to the moderate-intensity exercise.  Maintain a healthy weight  Body mass index (BMI) is a measurement that can be used to identify possible weight problems. It estimates body fat based on height and weight. Your health care provider can help determine your BMI and help you achieve or maintain a healthy weight.  For females 20 years of age and older:   A BMI below 18.5 is considered underweight.  A BMI of 18.5 to 24.9 is normal.  A BMI of 25 to 29.9 is considered overweight.  A BMI of 30 and above is considered obese.  Watch levels of cholesterol and blood lipids  You should start having your blood tested for lipids and cholesterol at 79 years of age, then have this test every 5 years.  You may need to have your cholesterol levels checked more often if:  Your lipid or cholesterol levels are high.  You are older than 79 years of age.  You are at high risk for heart disease.  CANCER SCREENING   Lung Cancer  Lung cancer screening is recommended for adults 55-80 years old who are at high risk for lung cancer because of a history of smoking.  A yearly low-dose CT scan of the lungs is recommended for people who:  Currently smoke.  Have quit within the past 15  years.  Have at least a 30-pack-year history of smoking. A pack year is smoking an average of one pack of cigarettes a day for 1 year.  Yearly screening should continue until it has been 15 years since you quit.  Yearly screening should stop if you develop a health problem that would prevent you from having lung cancer treatment.  Breast Cancer  Practice breast self-awareness. This means understanding how your breasts normally appear and feel.  It also means doing regular breast self-exams. Let your health care provider know about any changes, no matter how small.  If you are in your 20s or 30s, you should have a clinical breast exam (CBE) by a health care provider every 1-3 years as part of a regular health exam.  If you are 40 or older, have a CBE every year. Also consider having a breast X-ray (mammogram) every year.  If you have a family history of breast cancer, talk to your health care provider about genetic screening.  If you are at high risk for breast cancer, talk to your health care provider about having an MRI and a mammogram every year.  Breast cancer gene (BRCA) assessment is recommended for women who have family members with BRCA-related cancers. BRCA-related cancers include:  Breast.  Ovarian.  Tubal.  Peritoneal cancers.  Results of the assessment will determine the need for genetic counseling and BRCA1 and BRCA2 testing. Cervical Cancer Your health care provider may recommend that you be screened regularly for cancer of the pelvic organs (ovaries, uterus, and vagina). This screening involves a pelvic examination, including checking for microscopic changes to the surface of your cervix (Pap test). You may be encouraged to have this screening done every 3 years, beginning at age 21.  For women   ages 30-65, health care providers may recommend pelvic exams and Pap testing every 3 years, or they may recommend the Pap and pelvic exam, combined with testing for human  papilloma virus (HPV), every 5 years. Some types of HPV increase your risk of cervical cancer. Testing for HPV may also be done on women of any age with unclear Pap test results.  Other health care providers may not recommend any screening for nonpregnant women who are considered low risk for pelvic cancer and who do not have symptoms. Ask your health care provider if a screening pelvic exam is right for you.  If you have had past treatment for cervical cancer or a condition that could lead to cancer, you need Pap tests and screening for cancer for at least 20 years after your treatment. If Pap tests have been discontinued, your risk factors (such as having a new sexual partner) need to be reassessed to determine if screening should resume. Some women have medical problems that increase the chance of getting cervical cancer. In these cases, your health care provider may recommend more frequent screening and Pap tests. Colorectal Cancer  This type of cancer can be detected and often prevented.  Routine colorectal cancer screening usually begins at 79 years of age and continues through 79 years of age.  Your health care provider may recommend screening at an earlier age if you have risk factors for colon cancer.  Your health care provider may also recommend using home test kits to check for hidden blood in the stool.  A small camera at the end of a tube can be used to examine your colon directly (sigmoidoscopy or colonoscopy). This is done to check for the earliest forms of colorectal cancer.  Routine screening usually begins at age 50.  Direct examination of the colon should be repeated every 5-10 years through 79 years of age. However, you may need to be screened more often if early forms of precancerous polyps or small growths are found. Skin Cancer  Check your skin from head to toe regularly.  Tell your health care provider about any new moles or changes in moles, especially if there is a  change in a mole's shape or color.  Also tell your health care provider if you have a mole that is larger than the size of a pencil eraser.  Always use sunscreen. Apply sunscreen liberally and repeatedly throughout the day.  Protect yourself by wearing long sleeves, pants, a wide-brimmed hat, and sunglasses whenever you are outside. HEART DISEASE, DIABETES, AND HIGH BLOOD PRESSURE   High blood pressure causes heart disease and increases the risk of stroke. High blood pressure is more likely to develop in:  People who have blood pressure in the high end of the normal range (130-139/85-89 mm Hg).  People who are overweight or obese.  People who are African American.  If you are 18-39 years of age, have your blood pressure checked every 3-5 years. If you are 40 years of age or older, have your blood pressure checked every year. You should have your blood pressure measured twice--once when you are at a hospital or clinic, and once when you are not at a hospital or clinic. Record the average of the two measurements. To check your blood pressure when you are not at a hospital or clinic, you can use:  An automated blood pressure machine at a pharmacy.  A home blood pressure monitor.  If you are between 55 years and 79 years old,   ask your health care provider if you should take aspirin to prevent strokes.  Have regular diabetes screenings. This involves taking a blood sample to check your fasting blood sugar level.  If you are at a normal weight and have a low risk for diabetes, have this test once every three years after 79 years of age.  If you are overweight and have a high risk for diabetes, consider being tested at a younger age or more often. PREVENTING INFECTION  Hepatitis B  If you have a higher risk for hepatitis B, you should be screened for this virus. You are considered at high risk for hepatitis B if:  You were born in a country where hepatitis B is common. Ask your health  care provider which countries are considered high risk.  Your parents were born in a high-risk country, and you have not been immunized against hepatitis B (hepatitis B vaccine).  You have HIV or AIDS.  You use needles to inject street drugs.  You live with someone who has hepatitis B.  You have had sex with someone who has hepatitis B.  You get hemodialysis treatment.  You take certain medicines for conditions, including cancer, organ transplantation, and autoimmune conditions. Hepatitis C  Blood testing is recommended for:  Everyone born from 1945 through 1965.  Anyone with known risk factors for hepatitis C. Sexually transmitted infections (STIs)  You should be screened for sexually transmitted infections (STIs) including gonorrhea and chlamydia if:  You are sexually active and are younger than 79 years of age.  You are older than 79 years of age and your health care provider tells you that you are at risk for this type of infection.  Your sexual activity has changed since you were last screened and you are at an increased risk for chlamydia or gonorrhea. Ask your health care provider if you are at risk.  If you do not have HIV, but are at risk, it may be recommended that you take a prescription medicine daily to prevent HIV infection. This is called pre-exposure prophylaxis (PrEP). You are considered at risk if:  You are sexually active and do not regularly use condoms or know the HIV status of your partner(s).  You take drugs by injection.  You are sexually active with a partner who has HIV. Talk with your health care provider about whether you are at high risk of being infected with HIV. If you choose to begin PrEP, you should first be tested for HIV. You should then be tested every 3 months for as long as you are taking PrEP.  PREGNANCY   If you are premenopausal and you may become pregnant, ask your health care provider about preconception counseling.  If you may  become pregnant, take 400 to 800 micrograms (mcg) of folic acid every day.  If you want to prevent pregnancy, talk to your health care provider about birth control (contraception). OSTEOPOROSIS AND MENOPAUSE   Osteoporosis is a disease in which the bones lose minerals and strength with aging. This can result in serious bone fractures. Your risk for osteoporosis can be identified using a bone density scan.  If you are 65 years of age or older, or if you are at risk for osteoporosis and fractures, ask your health care provider if you should be screened.  Ask your health care provider whether you should take a calcium or vitamin D supplement to lower your risk for osteoporosis.  Menopause may have certain physical symptoms and risks.    Hormone replacement therapy may reduce some of these symptoms and risks. Talk to your health care provider about whether hormone replacement therapy is right for you.  HOME CARE INSTRUCTIONS   Schedule regular health, dental, and eye exams.  Stay current with your immunizations.   Do not use any tobacco products including cigarettes, chewing tobacco, or electronic cigarettes.  If you are pregnant, do not drink alcohol.  If you are breastfeeding, limit how much and how often you drink alcohol.  Limit alcohol intake to no more than 1 drink per day for nonpregnant women. One drink equals 12 ounces of beer, 5 ounces of wine, or 1 ounces of hard liquor.  Do not use street drugs.  Do not share needles.  Ask your health care provider for help if you need support or information about quitting drugs.  Tell your health care provider if you often feel depressed.  Tell your health care provider if you have ever been abused or do not feel safe at home.   This information is not intended to replace advice given to you by your health care provider. Make sure you discuss any questions you have with your health care provider.   Document Released: 07/22/2010  Document Revised: 01/27/2014 Document Reviewed: 12/08/2012 Elsevier Interactive Patient Education 2016 [employer].  Hearing Loss Hearing loss is a partial or total loss of the ability to hear. This can be temporary or permanent, and it can happen in one or both ears. Hearing loss may be referred to as deafness. Medical care is necessary to treat hearing loss properly and to prevent the condition from getting worse. Your hearing may partially or completely come back, depending on what caused your hearing loss and how severe it is. In some cases, hearing loss is permanent. CAUSES Common causes of hearing loss include:   Too much wax in the ear canal.   Infection of the ear canal or middle ear.   Fluid in the middle ear.   Injury to the ear or surrounding area.   An object stuck in the ear.   Prolonged exposure to loud sounds, such as music.  Less common causes of hearing loss include:   Tumors in the ear.   Viral or bacterial infections, such as meningitis.   A hole in the eardrum (perforated eardrum).  Problems with the hearing nerve that sends signals between the brain and the ear.  Certain medicines.  SYMPTOMS  Symptoms of this condition may include:  Difficulty telling the difference between sounds.  Difficulty following a conversation when there is background noise.  Lack of response to sounds in your environment. This may be most noticeable when you do not respond to startling sounds.  Needing to turn up the volume on the television, radio, etc.  Ringing in the ears.  Dizziness.  Pain in the ears. DIAGNOSIS This condition is diagnosed based on a physical exam and a hearing test (audiometry). The audiometry test will be performed by a hearing specialist (audiologist). You may also be referred to an ear, nose, and throat (ENT) specialist (otolaryngologist).  TREATMENT Treatment for recent onset of hearing loss may include:   Ear wax removal.    Being prescribed medicines to prevent infection (antibiotics).   Being prescribed medicines to reduce inflammation (corticosteroids).  HOME CARE INSTRUCTIONS  If you were prescribed an antibiotic medicine, take it as told by your health care provider. Do not stop taking the antibiotic even if you start to feel better.  Take over-the-counter  and prescription medicines only as told by your health care provider.  Avoid loud noises.   Return to your normal activities as told by your health care provider. Ask your health care provider what activities are safe for you.  Keep all follow-up visits as told by your health care provider. This is important. SEEK MEDICAL CARE IF:   You feel dizzy.   You develop new symptoms.   You vomit or feel nauseous.   You have a fever.  SEEK IMMEDIATE MEDICAL CARE IF:  You develop sudden changes in your vision.   You have severe ear pain.   You have new or increased weakness.  You have a severe headache.   This information is not intended to replace advice given to you by your health care provider. Make sure you discuss any questions you have with your health care provider.   Document Released: 01/06/2005 Document Revised: 09/27/2014 Document Reviewed: 05/24/2014 Elsevier Interactive Patient Education [employer].

## 2014-11-01 NOTE — Progress Notes (Signed)
Subjective:   Olivia Evans is a 81 y.o. female who presents for Medicare Annual (Subsequent) preventive examination.  Review of Systems:  HRA assessment completed during visit; Scouten, Jacquie The Patient was informed that this wellness visit is to identify risk and educate on how to reduce risk for increase disease through lifestyle changes.   ROS deferred to CPE exam with physician scheduled  Medical issues syncope;  Constipation; at this time, has medicine and can manage; has had chronic constipation all of her life. Water intake good prior to 6pm;   Osteopenia/ discussed issues; no fx to date; Followed by Dr. Nori Riis and will plan to have further dexa scans at Dr. Verlon Au office; States she is up and about shopping, eating out or cleaning home, working in the yard; no formal exercise and not interested at this time. Gait is straight and confident; one fall in yard due to tripping, but otherwise no issues;   Risk for falls include interstitial cystitis which keeps her up at night sometimes;   Hyperlipidemia - can't tolerate lipids; last HDL 92;   BMI: 19 and is comfortable with weight Diet; eats 2 meals a day; late breakfast; had peanut butter on graham; egg breakfast with bacon; Dinner; by the frozen dinners; slaw; tossed salad   Exercise; usual cleaning; yard work;   Sleep; sometimes up going to bathroom; Bladder stretched in August and feels better at present; Taking Oxycodone 7.5mg  with 325mg  of tylenol periodically as needed; Follows up with Dr. Matilde Sprang, urology;  monitors interstitial cystitis;   SAFETY; one story home; does not go up steps Dtr 3 miles away; works at [employer] reviewed for the home; including removal of clutter; clear paths through the home, eliminating clutter, railing as needed; bathroom safety; community safety; smoke detectors (yes)  and firearms safety as well as sun protection;  Given information on fall risk;  Hobbies; did have friends that  played games but has lost friends at this time;  Had one good friend she when out to eat with; shopping 4 times a week who is now sick; so she is adapting again.   Driving accidents no; seatbelts -yes Stressors; not at this time  Medication review completed  Fall assessment; slipped in yard in the summer helping to trim bushes;    Mobilization and Functional losses in the last year. None stated but does have issues with her neck and currently seeing Dr. Durward Fortes and may have injection.   Counseling: Colonoscopy; 05/12/2011 normal and now aged out  EKG 06/2010 Dexa scan; 05/2011 ordered by Dr. Nori Riis at [facility];  AP Spine -3.3; femur left -2.4 and right -2.7/ Dexa scan deferred to Dr. Nori Riis;  Recommendations Calcium 1200; Vit D 2000; mod exercise and fup scan 24 months Mammogram 03/2010 to screen in one year Hearing: 2000hz  in both ears/ does lose hearing at times; sounds like she is in a barrel Ophthalmology exam; had cataracts in both eyes in March;   Immunizations Due  PCV 13 and flu/ will defer to Dr. [name] due to latex allergy Per the [facility];   Current Care Team reviewed and updated MacDiarmid, scott Dr. Nori Riis; Dr. Durward Fortes   Cardiac Risk Factors include: advanced age (>13men, >56 women);family history of premature cardiovascular disease     Objective:     Vitals: BP 130/60 mmHg  Pulse 68  Temp(Src) 97.7 F (36.5 C)  Ht 5\' 6"  (1.676 m)  Wt 121 lb (54.885 kg)  BMI 19.54 kg/m2  SpO2 99%  Tobacco History  Smoking status  . Never Smoker   Smokeless tobacco  . Never Used     Counseling given: Yes   Past Medical History  Diagnosis Date  . Interstitial cystitis     Dr McDiarmid  . Osteopenia   . Diverticulosis 2013    Dr Fuller Plan  . Hemorrhoids   . Heart murmur   . History of breast cancer 2001---RIGHT BREAST CANCER    S/P PARTIAL MASTECTOMY AND RADIATION--  NO RECURRENCE  . Arthritis KNEES  . Bladder pain   . History of adenomatous  polyp of colon     2003 -- VILLOUS  . Lower urinary tract symptoms (LUTS)   . Normal cardiac stress test     2000  PER MD NOTE  . Incomplete right bundle branch block (RBBB)   . History of palpitations   . Dyslipidemia   . Chronic constipation   . Wears glasses   . Wears dentures     full -upper/  partial lower   Past Surgical History  Procedure Laterality Date  . Varicose vein surgery    . Tubal ligation    . Rotator cuff repair    . Benign tumor removed  1960'S    SECOND RIB  . Vaginal hysterectomy  1970'S    WITH APPENDECTOMY  . Cysto with hydrodistension  07/29/2011    Procedure: CYSTOSCOPY/HYDRODISTENSION;  Surgeon: Reece Packer, MD;  Location[facility];  Service: Urology;  Laterality: N/A;  with fulguration of bladder ulcer  . Partial mastectomy w/ sln dissection Right 12-19-1999  . Cysto/  hydrodistention/  instillation therapy  x2  01-18- and 11-15- 2011  . Cysto/ hod/  bladder bx's/  fulgeration hunner ulcer's /  instillation therapy  10-01-2010  . Cataract extraction w/ intraocular lens  implant, bilateral  feb 2016  . Cysto with hydrodistension N/A 08/28/2014    Procedure: CYSTO/HYDRODISTENSION, FULGURATION OF ULCERS;  Surgeon: Bjorn Loser, MD;  Location: [facility];  Service: Urology;  Laterality: N/A;   Family History  Problem Relation Age of Onset  . Heart disease Mother      CHF; no MI  . Breast cancer Sister     S/P bilateral mastectomy  . Diabetes Maternal Grandmother   . Thyroid disease      3 daughters  . Colon cancer Neg Hx   . Esophageal cancer Neg Hx   . Stomach cancer Neg Hx   . Rectal cancer Neg Hx   . Stroke Brother 30   History  Sexual Activity  . Sexual Activity: Not on file    Outpatient Encounter Prescriptions as of 11/01/2014  Medication Sig  . BIOTIN PO Take 1 capsule by mouth daily.  . Calcium Glycerophosphate (PRELIEF PO) Take 2 tablets by mouth daily. 2 by mouth daily with food  .  Cholecalciferol (VITAMIN D3) 1000 UNITS CAPS Take 1 capsule by mouth daily.  Marland Kitchen estradiol (ESTRACE) 0.1 MG/GM vaginal cream Place 2 g vaginally. 2-3 times weekly  . Glucos-Chondroit-Collag-Hyal (JOINT SUPPORT FORMULA PO) Take by mouth.  . hydrochlorothiazide (MICROZIDE) 12.5 MG capsule TAKE 1 CAPSULE (12.5 MG TOTAL) BY MOUTH DAILY. (Patient taking differently: TAKE 1 CAPSULE (12.5 MG TOTAL) BY MOUTH DAILY.--  PRN  WHEN FEET SWELL)  . OVER THE COUNTER MEDICATION SMOOTH MOVE TEA--  QD PRN FOR CONSTIPATION  . Polyethyl Glycol-Propyl Glycol (SYSTANE OP) Apply 1 drop to eye daily. To both eyes.  . polyethylene glycol powder (MIRALAX) powder  Take 17 g by mouth as needed. For constipation  . senna (SENOKOT) 8.6 MG tablet Take 1 tablet by mouth daily as needed. For constipation  . Wheat Dextrin (BENEFIBER DRINK MIX PO) Take by mouth as needed.  Marland Kitchen HYDROcodone-acetaminophen (NORCO) 5-325 MG per tablet Take 1 tablet by mouth every 6 (six) hours as needed for moderate pain. (Patient not taking: Reported on 11/01/2014)  . oxyCODONE-acetaminophen (PERCOCET/ROXICET) 5-325 MG per tablet Take 1 tablet by mouth every 4 (four) hours as needed for severe pain (every 4 to 6 hours).   . [DISCONTINUED] mometasone (ELOCON) 0.1 % ointment Apply topically 2 (two) times daily. (Patient not taking: Reported on 11/01/2014)   No facility-administered encounter medications on file as of 11/01/2014.    Activities of Daily Living In your present state of health, do you have any difficulty performing the following activities: 11/01/2014 08/28/2014  Hearing? N N  Vision? Y N  Difficulty concentrating or making decisions? N N  Walking or climbing stairs? Y N  Dressing or bathing? N N  Doing errands, shopping? N -  Preparing Food and eating ? N -  Using the Toilet? N -  In the past six months, have you accidently leaked urine? Y -  Do you have problems with loss of bowel control? Y -  Managing your Medications? N -  Managing  your Finances? N -  Housekeeping or managing your Housekeeping? N -    Patient Care Team: Hendricks Limes, MD as PCP - General    Assessment:    Assessment   Patient presents for yearly preventative medicine examination. Medicare questionnaire screening were completed, i.e. Functional; fall risk; depression, memory loss and hearing.  Mild hearing loss; Sad due to recent illness of close friend.   All immunizations and health maintenance protocols were reviewed with the patient and needed orders were placed. Has allergy to latex and will defer Dr. [name].  Education provided for laboratory screens;    Medication reconciliation, past medical history, social history, problem list and allergies were reviewed in detail with the patient  Goals were established based on the patient's wishes End of life planning was discussed and the patient has completed    Exercise Activities and Dietary recommendations Current Exercise Habits:: Home exercise routine, Type of exercise: walking (takes care of home), Intensity: Mild  Goals    . patient      Will continue to maintain health as is;       Fall Risk Fall Risk  11/01/2014 06/30/2012  Falls in the past year? Yes No  Number falls in past yr: 1 -  Injury with Fall? No -  Follow up Education provided -   Depression Screen PHQ 2/9 Scores 11/01/2014 06/30/2012  PHQ - 2 Score 0 0     Cognitive Testing MMSE - Mini Mental State Exam 11/01/2014  Not completed: (No Data)    Immunization History  Administered Date(s) Administered  . Influenza Split 01/27/2011, 12/10/2011  . Influenza Whole 10/29/2007, 10/24/2008, 10/09/2009  . Influenza, High Dose Seasonal PF 11/04/2012  . Influenza,inj,Quad PF,36+ Mos 11/15/2013  . Pneumococcal Polysaccharide-23 04/01/2011  . Tetanus 07/21/2007  . Zoster 02/03/2013   Screening Tests Health Maintenance  Topic Date Due  . PNA vac Low Risk Adult (2 of 2 - PCV13) 03/31/2012  . INFLUENZA VACCINE   08/21/2014  . TETANUS/TDAP  07/20/2017  . DEXA SCAN  Completed  . ZOSTAVAX  Completed      Plan:   Agrees to discuss  Flu and prevnar with Dr. [name]  Will fup with hearing test   Fall risk 2dary to pain management for interstitial cystitis but generally takes at hs if she can't sleep; tolerates well; no add'l falls; voiding issues and bladder pain continue to put her at risk. Will void multiples times during the night although better since her bladder was stretched.   During the course of the visit the patient was educated and counseled about the following appropriate screening and preventive services:   Vaccines to include Pneumoccal, Influenza, Hepatitis B, Td, Zostavax, HCV  Electrocardiogram-   Cardiovascular Disease/ HDL 92; weight BMI 19;   Colorectal cancer screening/ completed and aged out  Bone density screening- 1013 and discussed repeat; will fup with Dr. Nori Riis  Diabetes screening/ n/a  Glaucoma screening/ eyes evaluated for cataracts in March 2016  Mammography/ completed this year / will call Dr. Nori Riis to get report but states it was normal.  Nutrition counseling; discussed; happy with weight; pain is managed at present. Denies stresses; Family close by;   Patient Instructions (the written plan) was given to the patient.   Wynetta Fines, RN  11/01/2014

## 2014-11-02 NOTE — Telephone Encounter (Signed)
Awaiting fax from Dr. Nori Riis for mammogram report

## 2014-11-08 ENCOUNTER — Encounter: Payer: Self-pay | Admitting: Internal Medicine

## 2014-11-17 ENCOUNTER — Other Ambulatory Visit (INDEPENDENT_AMBULATORY_CARE_PROVIDER_SITE_OTHER): Payer: PPO

## 2014-11-17 ENCOUNTER — Ambulatory Visit (INDEPENDENT_AMBULATORY_CARE_PROVIDER_SITE_OTHER): Payer: PPO | Admitting: Internal Medicine

## 2014-11-17 ENCOUNTER — Encounter: Payer: Self-pay | Admitting: Internal Medicine

## 2014-11-17 VITALS — BP 108/64 | HR 73 | Temp 97.5°F | Resp 16 | Ht 66.0 in | Wt 119.5 lb

## 2014-11-17 DIAGNOSIS — E785 Hyperlipidemia, unspecified: Secondary | ICD-10-CM

## 2014-11-17 DIAGNOSIS — R600 Localized edema: Secondary | ICD-10-CM | POA: Diagnosis not present

## 2014-11-17 DIAGNOSIS — Z23 Encounter for immunization: Secondary | ICD-10-CM

## 2014-11-17 DIAGNOSIS — R030 Elevated blood-pressure reading, without diagnosis of hypertension: Secondary | ICD-10-CM

## 2014-11-17 DIAGNOSIS — Z860100 Personal history of colon polyps, unspecified: Secondary | ICD-10-CM

## 2014-11-17 DIAGNOSIS — E559 Vitamin D deficiency, unspecified: Secondary | ICD-10-CM

## 2014-11-17 DIAGNOSIS — N3010 Interstitial cystitis (chronic) without hematuria: Secondary | ICD-10-CM | POA: Diagnosis not present

## 2014-11-17 LAB — BASIC METABOLIC PANEL
BUN: 14 mg/dL (ref 6–23)
CALCIUM: 10.4 mg/dL (ref 8.4–10.5)
CO2: 30 mEq/L (ref 19–32)
Chloride: 104 mEq/L (ref 96–112)
Creatinine, Ser: 0.68 mg/dL (ref 0.40–1.20)
GFR: 87.05 mL/min (ref >60.00)
GLUCOSE: 101 mg/dL — AB (ref 70–99)
POTASSIUM: 3.9 meq/L (ref 3.5–5.1)
Sodium: 142 mEq/L (ref 135–145)

## 2014-11-17 LAB — CBC WITH DIFFERENTIAL/PLATELET
BASOS ABS: 0 10*3/uL (ref 0.0–0.1)
Basophils Relative: 0.9 % (ref 0.0–3.0)
EOS PCT: 1.9 % (ref 0.0–5.0)
Eosinophils Absolute: 0.1 10*3/uL (ref 0.0–0.7)
HCT: 39.2 % (ref 36.0–46.0)
HEMOGLOBIN: 13 g/dL (ref 12.0–15.0)
LYMPHS ABS: 1.9 10*3/uL (ref 0.7–4.0)
LYMPHS PCT: 38 % (ref 12.0–46.0)
MCHC: 33.1 g/dL (ref 30.0–36.0)
MCV: 101.7 fl — AB (ref 78.0–100.0)
MONOS PCT: 4.8 % (ref 3.0–12.0)
Monocytes Absolute: 0.2 10*3/uL (ref 0.1–1.0)
Neutro Abs: 2.7 10*3/uL (ref 1.4–7.7)
Neutrophils Relative %: 54.4 % (ref 43.0–77.0)
Platelets: 232 10*3/uL (ref 150.0–400.0)
RBC: 3.86 Mil/uL — AB (ref 3.87–5.11)
RDW: 14.8 % (ref 11.5–15.5)
WBC: 4.9 10*3/uL (ref 4.0–10.5)

## 2014-11-17 LAB — LIPID PANEL
Cholesterol: 209 mg/dL — ABNORMAL HIGH (ref 0–200)
HDL: 88.9 mg/dL (ref >39.00)
LDL CALC: 107 mg/dL — AB (ref 0–99)
NonHDL: 119.6
Total CHOL/HDL Ratio: 2
Triglycerides: 64 mg/dL (ref 0.0–149.0)
VLDL: 12.8 mg/dL (ref 0.0–40.0)

## 2014-11-17 LAB — TSH: TSH: 0.78 u[IU]/mL (ref 0.35–4.50)

## 2014-11-17 LAB — HEPATIC FUNCTION PANEL
ALBUMIN: 4.6 g/dL (ref 3.5–5.2)
ALT: 14 U/L (ref 0–35)
AST: 20 U/L (ref 0–37)
Alkaline Phosphatase: 59 U/L (ref 39–117)
Bilirubin, Direct: 0.1 mg/dL (ref 0.0–0.3)
Total Bilirubin: 0.8 mg/dL (ref 0.2–1.2)
Total Protein: 7 g/dL (ref 6.0–8.3)

## 2014-11-17 LAB — VITAMIN D 25 HYDROXY (VIT D DEFICIENCY, FRACTURES): VITD: 39.03 ng/mL (ref 30.00–100.00)

## 2014-11-17 MED ORDER — HYDROCHLOROTHIAZIDE 12.5 MG PO CAPS: ORAL_CAPSULE | ORAL | Status: DC

## 2014-11-17 NOTE — Progress Notes (Signed)
   Subjective:    Patient ID: Olivia Evans, female    DOB: 1927/01/12, 81 y.o.   MRN: [id number]  HPI The patient is here to assess status of active health conditions.  PMH, FH, & Social History reviewed & updated.No change in [place] as recorded.  She is on a heart healthy diet. She is active with house and yard work w/o symptoms. BP averages in the  140s/60s.HCTZ controls edema.  She has aged out of colonoscopies. She has no GI symptoms.  Her major issues relate to Interstitial Cystitis for which she sees Dr McDiarmid.She takes a narcotic pain med every 4-6 hrs as needed. She has nocturia up to every 1/2 hr.  She is intolerant to cold.  Review of Systems  Chest pain, palpitations, tachycardia, exertional dyspnea, paroxysmal nocturnal dyspnea, or claudication are absent. No unexplained weight loss, abdominal pain, significant dyspepsia, dysphagia, melena, rectal bleeding, or persistently small caliber stools. Pyuria, frank hematuria, or polyuria are denied. Change in hair, skin, nails denied. No bowel changes of constipation or diarrhea. No intolerance to heat .     Objective:   Physical Exam  Pertinent or positive findings include:Appears younger than stated age. Bilateral ptosis.Upper plate. Septum deviated to L. R thyroid lobe smaller than L.Slight hearing loss bilaterally.Aorta palpable ; no AAA.Decreased pedal pulses.Edema 1/2 + LLE; trace RLE.  General appearance : Thin but adequately nourished; in no distress.  Eyes: No conjunctival inflammation or scleral icterus is present.  Oral exam:  Lips and gums are healthy appearing.There is no oropharyngeal erythema or exudate noted.  Heart:  Normal rate and regular rhythm. S1 and S2 normal without gallop, murmur, click, rub or other extra sounds    Lungs:Chest clear to auscultation; no wheezes, rhonchi,rales ,or rubs present.No increased work of breathing.   Abdomen: bowel sounds normal, soft and non-tender without masses,  organomegaly or hernias noted.  No guarding or rebound.  Vascular : all pulses equal ; no bruits present.  Skin:Warm & dry.  Intact without suspicious lesions or rashes ; no tenting or jaundice   Lymphatic: No lymphadenopathy is noted about the head, neck, axilla.   Neuro: Strength, tone & DTRs normal.     Assessment & Plan:  See Current Assessment & Plan in Problem List under specific Diagnosis

## 2014-11-17 NOTE — Patient Instructions (Signed)
  Your next office appointment will be determined based upon review of your pending labs  and  xrays  Those written interpretation of the lab results and instructions will be transmitted to you by My Chart   Critical results will be called.   Followup as needed for any active or acute issue. Please report any significant change in your symptoms. 

## 2014-11-17 NOTE — Progress Notes (Signed)
Pre visit review using our clinic review tool, if applicable. No additional management support is needed unless otherwise documented below in the visit note. 

## 2014-11-19 NOTE — Assessment & Plan Note (Signed)
CBC

## 2014-11-19 NOTE — Assessment & Plan Note (Signed)
Vit D level.

## 2014-11-19 NOTE — Assessment & Plan Note (Signed)
Lipids, LFTs, TSH  

## 2014-12-05 ENCOUNTER — Telehealth: Payer: Self-pay | Admitting: Internal Medicine

## 2014-12-05 NOTE — Telephone Encounter (Signed)
Left message on daughters voicemail (cell phone) that all labs are normal---and i have mailed copy of labs to her---any questions, daughter is to call back and ask for tamara

## 2014-12-05 NOTE — Telephone Encounter (Signed)
Pt daughter called in and would like the 10/28 lab results posted to mychart so they can look at them or sent by mail.

## 2014-12-08 ENCOUNTER — Encounter: Payer: Self-pay | Admitting: Internal Medicine

## 2015-02-15 DIAGNOSIS — Z79899 Other long term (current) drug therapy: Secondary | ICD-10-CM | POA: Diagnosis not present

## 2015-02-15 DIAGNOSIS — H1851 Endothelial corneal dystrophy: Secondary | ICD-10-CM | POA: Diagnosis not present

## 2015-02-15 DIAGNOSIS — H524 Presbyopia: Secondary | ICD-10-CM | POA: Diagnosis not present

## 2015-07-13 DIAGNOSIS — N3010 Interstitial cystitis (chronic) without hematuria: Secondary | ICD-10-CM | POA: Diagnosis not present

## 2015-09-03 DIAGNOSIS — S93419A Sprain of calcaneofibular ligament of unspecified ankle, initial encounter: Secondary | ICD-10-CM | POA: Diagnosis not present

## 2015-09-03 DIAGNOSIS — M79672 Pain in left foot: Secondary | ICD-10-CM | POA: Diagnosis not present

## 2015-09-03 DIAGNOSIS — M25572 Pain in left ankle and joints of left foot: Secondary | ICD-10-CM | POA: Diagnosis not present

## 2015-09-06 DIAGNOSIS — Z681 Body mass index (BMI) 19 or less, adult: Secondary | ICD-10-CM | POA: Diagnosis not present

## 2015-09-06 DIAGNOSIS — Z1272 Encounter for screening for malignant neoplasm of vagina: Secondary | ICD-10-CM | POA: Diagnosis not present

## 2015-09-06 DIAGNOSIS — Z124 Encounter for screening for malignant neoplasm of cervix: Secondary | ICD-10-CM | POA: Diagnosis not present

## 2015-09-06 DIAGNOSIS — Z1231 Encounter for screening mammogram for malignant neoplasm of breast: Secondary | ICD-10-CM | POA: Diagnosis not present

## 2015-09-06 DIAGNOSIS — Z01419 Encounter for gynecological examination (general) (routine) without abnormal findings: Secondary | ICD-10-CM | POA: Diagnosis not present

## 2015-10-03 DIAGNOSIS — R350 Frequency of micturition: Secondary | ICD-10-CM | POA: Diagnosis not present

## 2015-10-03 DIAGNOSIS — R109 Unspecified abdominal pain: Secondary | ICD-10-CM | POA: Diagnosis not present

## 2015-10-03 DIAGNOSIS — R351 Nocturia: Secondary | ICD-10-CM | POA: Diagnosis not present

## 2015-10-23 DIAGNOSIS — N3010 Interstitial cystitis (chronic) without hematuria: Secondary | ICD-10-CM | POA: Diagnosis not present

## 2015-11-07 DIAGNOSIS — R109 Unspecified abdominal pain: Secondary | ICD-10-CM | POA: Diagnosis not present

## 2015-11-07 DIAGNOSIS — N3946 Mixed incontinence: Secondary | ICD-10-CM | POA: Diagnosis not present

## 2015-11-19 ENCOUNTER — Encounter: Payer: PPO | Admitting: Internal Medicine

## 2015-11-26 DIAGNOSIS — H353110 Nonexudative age-related macular degeneration, right eye, stage unspecified: Secondary | ICD-10-CM | POA: Diagnosis not present

## 2015-11-26 DIAGNOSIS — Z961 Presence of intraocular lens: Secondary | ICD-10-CM | POA: Diagnosis not present

## 2015-11-26 DIAGNOSIS — H16223 Keratoconjunctivitis sicca, not specified as Sjogren's, bilateral: Secondary | ICD-10-CM | POA: Diagnosis not present

## 2015-11-26 DIAGNOSIS — H04123 Dry eye syndrome of bilateral lacrimal glands: Secondary | ICD-10-CM | POA: Diagnosis not present

## 2015-12-10 DIAGNOSIS — H5202 Hypermetropia, left eye: Secondary | ICD-10-CM | POA: Diagnosis not present

## 2015-12-10 DIAGNOSIS — H52223 Regular astigmatism, bilateral: Secondary | ICD-10-CM | POA: Diagnosis not present

## 2015-12-10 DIAGNOSIS — H524 Presbyopia: Secondary | ICD-10-CM | POA: Diagnosis not present

## 2015-12-10 DIAGNOSIS — H04123 Dry eye syndrome of bilateral lacrimal glands: Secondary | ICD-10-CM | POA: Diagnosis not present

## 2016-01-24 ENCOUNTER — Encounter: Payer: PPO | Admitting: Internal Medicine

## 2016-01-24 ENCOUNTER — Encounter: Payer: Self-pay | Admitting: Internal Medicine

## 2016-01-24 NOTE — Progress Notes (Signed)
Subjective:    Patient ID: Olivia Evans, female    DOB: 26-Jan-1926, 83 y.o.   MRN: [id number]  HPI   Medications and allergies reviewed with patient and updated if appropriate.  Patient Active Problem List   Diagnosis Date Noted  . Diverticulosis of colon without hemorrhage 11/15/2013  . Elevated blood pressure reading without diagnosis of hypertension 11/15/2013  . Vitamin D deficiency 06/30/2012  . INTERSTITIAL CYSTITIS 10/09/2009  . Osteopenia 10/09/2009  . CONSTIPATION 12/13/2007  . History of colonic polyps 12/13/2007  . Hyperlipidemia 08/26/2007  . SYNCOPE 06/24/2006    Current Outpatient Prescriptions on File Prior to Visit  Medication Sig Dispense Refill  . BIOTIN PO Take 1 capsule by mouth daily.    . Calcium Glycerophosphate (PRELIEF PO) Take 2 tablets by mouth daily. 2 by mouth daily with food    . Cholecalciferol (VITAMIN D3) 1000 UNITS CAPS Take 1 capsule by mouth daily.    Marland Kitchen estradiol (ESTRACE) 0.1 MG/GM vaginal cream Place 2 g vaginally. 2-3 times weekly    . Glucos-Chondroit-Collag-Hyal (JOINT SUPPORT FORMULA PO) Take by mouth.    . hydrochlorothiazide (MICROZIDE) 12.5 MG capsule TAKE 1 CAPSULE (12.5 MG TOTAL) BY MOUTH DAILY WHEN FEET SWELL 90 capsule 1  . OVER THE COUNTER MEDICATION SMOOTH MOVE TEA--  QD PRN FOR CONSTIPATION    . oxyCODONE-acetaminophen (PERCOCET/ROXICET) 5-325 MG per tablet Take 1 tablet by mouth every 4 (four) hours as needed for severe pain (every 4 to 6 hours).     Vladimir Faster Glycol-Propyl Glycol (SYSTANE OP) Apply 1 drop to eye daily. To both eyes.    . polyethylene glycol powder (MIRALAX) powder Take 17 g by mouth as needed. For constipation    . senna (SENOKOT) 8.6 MG tablet Take 1 tablet by mouth daily as needed. For constipation     No current facility-administered medications on file prior to visit.     Past Medical History:  Diagnosis Date  . Arthritis KNEES  . Bladder pain   . Chronic constipation   . Diverticulosis 2013    Dr Fuller Plan  . Dyslipidemia   . Heart murmur   . Hemorrhoids   . History of adenomatous polyp of colon    2003 -- VILLOUS  . History of breast cancer 2001---RIGHT BREAST CANCER   S/P PARTIAL MASTECTOMY AND RADIATION--  NO RECURRENCE  . History of palpitations   . Incomplete right bundle branch block (RBBB)   . Interstitial cystitis    Dr McDiarmid  . Lower urinary tract symptoms (LUTS)   . Normal cardiac stress test    2000  PER MD NOTE  . Osteopenia   . Wears dentures    full -upper/  partial lower  . Wears glasses     Past Surgical History:  Procedure Laterality Date  . BENIGN TUMOR REMOVED  1960'S   SECOND RIB  . CATARACT EXTRACTION W/ INTRAOCULAR LENS  IMPLANT, BILATERAL  feb 2016  . CYSTO WITH HYDRODISTENSION  07/29/2011   Procedure: CYSTOSCOPY/HYDRODISTENSION;  Surgeon: Reece Packer, MD;  Location: [facility];  Service: Urology;  Laterality: N/A;  with fulguration of bladder ulcer  . CYSTO WITH HYDRODISTENSION N/A 08/28/2014   Procedure: CYSTO/HYDRODISTENSION, FULGURATION OF ULCERS;  Surgeon: Bjorn Loser, MD;  Location: [facility];  Service: Urology;  Laterality: N/A;  . CYSTO/  HYDRODISTENTION/  INSTILLATION THERAPY  x2  01-18- and 11-15- 2011  . CYSTO/ HOD/  BLADDER BX'S/  FULGERATION HUNNER ULCER'S /  INSTILLATION THERAPY  10-01-2010  . PARTIAL MASTECTOMY W/ SLN DISSECTION Right 12-19-1999  . ROTATOR CUFF REPAIR    . TUBAL LIGATION    . VAGINAL HYSTERECTOMY  1970'S   WITH APPENDECTOMY  . VARICOSE VEIN SURGERY      Social History   Social History  . Marital status: Widowed    Spouse name: N/A  . Number of children: 4  . Years of education: N/A   Occupational History  . Retired    Social History Main Topics  . Smoking status: Never Smoker  . Smokeless tobacco: Never Used  . Alcohol use No  . Drug use: No  . Sexual activity: Not on file   Other Topics Concern  . Not on file   Social History Narrative  . No  narrative on file    Family History  Problem Relation Age of Onset  . Heart disease Mother      CHF; no MI  . Breast cancer Sister     S/P bilateral mastectomy  . Diabetes Maternal Grandmother   . Thyroid disease      3 daughters  . Colon cancer Neg Hx   . Esophageal cancer Neg Hx   . Stomach cancer Neg Hx   . Rectal cancer Neg Hx   . Stroke Brother 8    Review of Systems     Objective:  There were no vitals filed for this visit. There were no vitals filed for this visit. There is no height or weight on file to calculate BMI.  Wt Readings from Last 3 Encounters:  11/17/14 119 lb 8 oz (54.2 kg)  11/01/14 121 lb (54.9 kg)  08/28/14 119 lb 8 oz (54.2 kg)     Physical Exam         Assessment & Plan:       This encounter was created in error - please disregard.

## 2016-02-07 ENCOUNTER — Encounter: Payer: PPO | Admitting: Internal Medicine

## 2016-02-29 ENCOUNTER — Ambulatory Visit: Payer: PPO | Admitting: Nurse Practitioner

## 2016-03-01 ENCOUNTER — Encounter: Payer: Self-pay | Admitting: Family Medicine

## 2016-03-01 ENCOUNTER — Ambulatory Visit (INDEPENDENT_AMBULATORY_CARE_PROVIDER_SITE_OTHER): Payer: PPO | Admitting: Family Medicine

## 2016-03-01 VITALS — BP 120/66 | HR 79 | Temp 97.7°F | Resp 18 | Ht 66.0 in | Wt 125.5 lb

## 2016-03-01 DIAGNOSIS — H66002 Acute suppurative otitis media without spontaneous rupture of ear drum, left ear: Secondary | ICD-10-CM | POA: Diagnosis not present

## 2016-03-01 DIAGNOSIS — J029 Acute pharyngitis, unspecified: Secondary | ICD-10-CM | POA: Diagnosis not present

## 2016-03-01 DIAGNOSIS — H9202 Otalgia, left ear: Secondary | ICD-10-CM

## 2016-03-01 MED ORDER — AMOXICILLIN 500 MG PO CAPS: 500.0000 mg | ORAL_CAPSULE | Freq: Three times a day (TID) | ORAL | 0 refills | Status: DC

## 2016-03-01 NOTE — Progress Notes (Signed)
Subjective:    Patient ID: Olivia Evans, female    DOB: 10/18/1926, 83 y.o.   MRN: [id number]  HPI  Olivia Evans is an 83 year old female who presents today with sore throat that started 6 days.  Associated symptoms of left ear pain which occurs intermittently. Intermittent pain occurs that can be sharp and a 10 and then it can be as low as zero. She reports that symptoms were better yesterday but occurred last night. Sore throat has improved and is not her major symptom concern. She denies fever, chills, sweats, rhinitis, sinus pressure, headache, cough, N/V/D, and myalgias. No recent antibiotic use or sick contact exposure.   Treatment at home with OTC ear drops that provided limited benefit. She is not a smoker. Symptoms are aggravated with drinking cold water. No alleviating factors.    Review of Systems  Constitutional: Negative for chills and fever.  HENT: Positive for ear pain, postnasal drip and sore throat. Negative for congestion, rhinorrhea, sinus pain, sinus pressure and sneezing.   Respiratory: Negative for cough, shortness of breath and wheezing.   Cardiovascular: Negative for chest pain and palpitations.  Gastrointestinal: Negative for abdominal pain, diarrhea, nausea and vomiting.  Musculoskeletal: Negative for myalgias.  Skin: Negative for rash.  Neurological: Negative for dizziness, light-headedness and headaches.   Past Medical History:  Diagnosis Date  . Arthritis KNEES  . Bladder pain   . Chronic constipation   . Diverticulosis 2013   Dr Fuller Plan  . Dyslipidemia   . Heart murmur   . Hemorrhoids   . History of adenomatous polyp of colon    2003 -- VILLOUS  . History of breast cancer 2001---RIGHT BREAST CANCER   S/P PARTIAL MASTECTOMY AND RADIATION--  NO RECURRENCE  . History of palpitations   . Incomplete right bundle branch block (RBBB)   . Interstitial cystitis    Dr McDiarmid  . Lower urinary tract symptoms (LUTS)   . Normal cardiac stress test    2000  PER MD NOTE  . Osteopenia   . Wears dentures    full -upper/  partial lower  . Wears glasses      Social History   Social History  . Marital status: Widowed    Spouse name: N/A  . Number of children: 4  . Years of education: N/A   Occupational History  . Retired    Social History Main Topics  . Smoking status: Never Smoker  . Smokeless tobacco: Never Used  . Alcohol use No  . Drug use: No  . Sexual activity: Not on file   Other Topics Concern  . Not on file   Social History Narrative  . No narrative on file    Past Surgical History:  Procedure Laterality Date  . BENIGN TUMOR REMOVED  1960'S   SECOND RIB  . CATARACT EXTRACTION W/ INTRAOCULAR LENS  IMPLANT, BILATERAL  feb 2016  . CYSTO WITH HYDRODISTENSION  07/29/2011   Procedure: CYSTOSCOPY/HYDRODISTENSION;  Surgeon: Reece Packer, MD;  Location: [facility];  Service: Urology;  Laterality: N/A;  with fulguration of bladder ulcer  . CYSTO WITH HYDRODISTENSION N/A 08/28/2014   Procedure: CYSTO/HYDRODISTENSION, FULGURATION OF ULCERS;  Surgeon: Bjorn Loser, MD;  Location: [facility];  Service: Urology;  Laterality: N/A;  . CYSTO/  HYDRODISTENTION/  INSTILLATION THERAPY  x2  01-18- and 11-15- 2011  . CYSTO/ HOD/  BLADDER BX'S/  FULGERATION HUNNER ULCER'S /  INSTILLATION THERAPY  10-01-2010  . PARTIAL  MASTECTOMY W/ SLN DISSECTION Right 12-19-1999  . ROTATOR CUFF REPAIR    . TUBAL LIGATION    . VAGINAL HYSTERECTOMY  1970'S   WITH APPENDECTOMY  . VARICOSE VEIN SURGERY      Family History  Problem Relation Age of Onset  . Heart disease Mother      CHF; no MI  . Breast cancer Sister     S/P bilateral mastectomy  . Diabetes Maternal Grandmother   . Thyroid disease      3 daughters  . Stroke Brother 9  . Colon cancer Neg Hx   . Esophageal cancer Neg Hx   . Stomach cancer Neg Hx   . Rectal cancer Neg Hx     Allergies  Allergen Reactions  . Premarin [Conjugated  Estrogens] Other (See Comments)    TABS--  CAUSE PHLEBITIS  . Vicodin [Hydrocodone-Acetaminophen] Itching    SEVERE  . Adhesive [Tape] Other (See Comments)    IRRITATION  . Fluvastatin Sodium Other (See Comments)    HEART PALPITATIONS  . Simvastatin Other (See Comments)    SEVERE LEG ACHES    Current Outpatient Prescriptions on File Prior to Visit  Medication Sig Dispense Refill  . BIOTIN PO Take 1 capsule by mouth daily.    . Calcium Glycerophosphate (PRELIEF PO) Take 2 tablets by mouth daily. 2 by mouth daily with food    . Cholecalciferol (VITAMIN D3) 1000 UNITS CAPS Take 1 capsule by mouth daily.    . Glucos-Chondroit-Collag-Hyal (JOINT SUPPORT FORMULA PO) Take by mouth.    . hydrochlorothiazide (MICROZIDE) 12.5 MG capsule TAKE 1 CAPSULE (12.5 MG TOTAL) BY MOUTH DAILY WHEN FEET SWELL 90 capsule 1  . oxyCODONE-acetaminophen (PERCOCET/ROXICET) 5-325 MG per tablet Take 1 tablet by mouth every 4 (four) hours as needed for severe pain (every 4 to 6 hours).     Vladimir Faster Glycol-Propyl Glycol (SYSTANE OP) Apply 1 drop to eye daily. To both eyes.    Marland Kitchen senna (SENOKOT) 8.6 MG tablet Take 1 tablet by mouth daily as needed. For constipation    . polyethylene glycol powder (MIRALAX) powder Take 17 g by mouth as needed. For constipation     No current facility-administered medications on file prior to visit.     BP 120/66 (BP Location: Left Arm, Patient Position: Sitting, Cuff Size: Normal)   Pulse 79   Temp 97.7 F (36.5 C) (Oral)   Resp 18   Ht 5\' 6"  (1.676 m)   Wt 125 lb 8 oz (56.9 kg)   SpO2 97%   BMI 20.26 kg/m       Objective:   Physical Exam  Constitutional: She is oriented to person, place, and time.  Thin, optimally nourished female  HENT:  Right Ear: Tympanic membrane normal.  Left Ear: Tympanic membrane is erythematous and bulging.  Mouth/Throat: Mucous membranes are normal. No oropharyngeal exudate or posterior oropharyngeal erythema.  Eyes: Pupils are equal,  round, and reactive to light. No scleral icterus.  Neck: Neck supple.  Cardiovascular: Normal rate and regular rhythm.   Pulmonary/Chest: Effort normal and breath sounds normal. She has no wheezes. She has no rales.  Abdominal: Soft. Bowel sounds are normal. There is no tenderness.  Lymphadenopathy:    She has cervical adenopathy.  Neurological: She is alert and oriented to person, place, and time.  Skin: Skin is warm and dry. No rash noted.       Assessment & Plan:  1. Acute suppurative otitis media of left ear without spontaneous  rupture of tympanic membrane, recurrence not specified Exam and history support treatment with amoxicillin. Advised close follow up if symptoms do not improve with treatment in 3 to 4 days, worsens, or fever >100. - amoxicillin (AMOXIL) 500 MG capsule; Take 1 capsule (500 mg total) by mouth 3 (three) times daily.  Dispense: 30 capsule; Refill: 0  2. Left ear pain Tylenol for discomfort  3. Sore throat Improved; tylenol and warm salt water gargle for discomfort.  Delano Metz, FNP-C

## 2016-03-01 NOTE — Patient Instructions (Signed)
Please take medication as directed and you can use tylenol for discomfort as needed. If symptoms do not improve, worsen, or you develop a fever >100, please follow up with your provider for further evaluation.   Otitis Media, Adult Otitis media is redness, soreness, and puffiness (swelling) in the space just behind your eardrum (middle ear). It may be caused by allergies or infection. It often happens along with a cold. Follow these instructions at home:  Take your medicine as told. Finish it even if you start to feel better.  Only take over-the-counter or prescription medicines for pain, discomfort, or fever as told by your doctor.  Follow up with your doctor as told. Contact a doctor if:  You have otitis media only in one ear, or bleeding from your nose, or both.  You notice a lump on your neck.  You are not getting better in 3-5 days.  You feel worse instead of better. Get help right away if:  You have pain that is not helped with medicine.  You have puffiness, redness, or pain around your ear.  You get a stiff neck.  You cannot move part of your face (paralysis).  You notice that the bone behind your ear hurts when you touch it. This information is not intended to replace advice given to you by your health care provider. Make sure you discuss any questions you have with your health care provider. Document Released: 06/25/2007 Document Revised: 06/14/2015 Document Reviewed: 08/03/2012 Elsevier Interactive Patient Education  2017 [employer].

## 2016-03-01 NOTE — Progress Notes (Signed)
Pre visit review using our clinic review tool, if applicable. No additional management support is needed unless otherwise documented below in the visit note. 

## 2016-03-26 ENCOUNTER — Ambulatory Visit (INDEPENDENT_AMBULATORY_CARE_PROVIDER_SITE_OTHER): Payer: PPO | Admitting: Internal Medicine

## 2016-03-26 ENCOUNTER — Other Ambulatory Visit (INDEPENDENT_AMBULATORY_CARE_PROVIDER_SITE_OTHER): Payer: PPO

## 2016-03-26 ENCOUNTER — Encounter: Payer: Self-pay | Admitting: Internal Medicine

## 2016-03-26 VITALS — BP 142/66 | HR 85 | Temp 97.5°F | Resp 16 | Ht 66.0 in | Wt 123.0 lb

## 2016-03-26 DIAGNOSIS — N3010 Interstitial cystitis (chronic) without hematuria: Secondary | ICD-10-CM | POA: Diagnosis not present

## 2016-03-26 DIAGNOSIS — Z0000 Encounter for general adult medical examination without abnormal findings: Secondary | ICD-10-CM

## 2016-03-26 DIAGNOSIS — M810 Age-related osteoporosis without current pathological fracture: Secondary | ICD-10-CM | POA: Diagnosis not present

## 2016-03-26 DIAGNOSIS — L989 Disorder of the skin and subcutaneous tissue, unspecified: Secondary | ICD-10-CM | POA: Diagnosis not present

## 2016-03-26 DIAGNOSIS — K5900 Constipation, unspecified: Secondary | ICD-10-CM | POA: Diagnosis not present

## 2016-03-26 DIAGNOSIS — E559 Vitamin D deficiency, unspecified: Secondary | ICD-10-CM

## 2016-03-26 LAB — COMPREHENSIVE METABOLIC PANEL
ALT: 15 U/L (ref 0–35)
AST: 18 U/L (ref 0–37)
Albumin: 4.2 g/dL (ref 3.5–5.2)
Alkaline Phosphatase: 68 U/L (ref 39–117)
BILIRUBIN TOTAL: 0.7 mg/dL (ref 0.2–1.2)
BUN: 19 mg/dL (ref 6–23)
CO2: 32 mEq/L (ref 19–32)
Calcium: 10.2 mg/dL (ref 8.4–10.5)
Chloride: 103 mEq/L (ref 96–112)
Creatinine, Ser: 0.72 mg/dL (ref 0.40–1.20)
GFR: 81.24 mL/min (ref >60.00)
GLUCOSE: 91 mg/dL (ref 70–99)
Potassium: 3.9 mEq/L (ref 3.5–5.1)
Sodium: 139 mEq/L (ref 135–145)
TOTAL PROTEIN: 6.6 g/dL (ref 6.0–8.3)

## 2016-03-26 LAB — CBC WITH DIFFERENTIAL/PLATELET
Basophils Absolute: 0.1 10*3/uL (ref 0.0–0.1)
Basophils Relative: 2.5 % (ref 0.0–3.0)
Eosinophils Absolute: 0.1 10*3/uL (ref 0.0–0.7)
Eosinophils Relative: 4.2 % (ref 0.0–5.0)
HEMATOCRIT: 34.3 % — AB (ref 36.0–46.0)
Hemoglobin: 11.7 g/dL — ABNORMAL LOW (ref 12.0–15.0)
LYMPHS PCT: 36.5 % (ref 12.0–46.0)
Lymphs Abs: 1.2 10*3/uL (ref 0.7–4.0)
MCHC: 34.1 g/dL (ref 30.0–36.0)
MCV: 101.6 fl — ABNORMAL HIGH (ref 78.0–100.0)
MONOS PCT: 7 % (ref 3.0–12.0)
Monocytes Absolute: 0.2 10*3/uL (ref 0.1–1.0)
Neutro Abs: 1.6 10*3/uL (ref 1.4–7.7)
Neutrophils Relative %: 49.8 % (ref 43.0–77.0)
PLATELETS: 202 10*3/uL (ref 150.0–400.0)
RBC: 3.37 Mil/uL — ABNORMAL LOW (ref 3.87–5.11)
RDW: 14 % (ref 11.5–15.5)
WBC: 3.3 10*3/uL — ABNORMAL LOW (ref 4.0–10.5)

## 2016-03-26 LAB — LIPID PANEL
Cholesterol: 191 mg/dL (ref 0–200)
HDL: 72.1 mg/dL (ref >39.00)
LDL Cholesterol: 106 mg/dL — ABNORMAL HIGH (ref 0–99)
NONHDL: 118.82
Total CHOL/HDL Ratio: 3
Triglycerides: 63 mg/dL (ref 0.0–149.0)
VLDL: 12.6 mg/dL (ref 0.0–40.0)

## 2016-03-26 LAB — VITAMIN D 25 HYDROXY (VIT D DEFICIENCY, FRACTURES): VITD: 33.35 ng/mL (ref 30.00–100.00)

## 2016-03-26 LAB — TSH: TSH: 1.28 u[IU]/mL (ref 0.35–4.50)

## 2016-03-26 NOTE — Assessment & Plan Note (Addendum)
[facility] Will see Dr Nori Riis this year  - recommended having it done Recommended treatment - she will discuss with Dr Nori Riis Check vitamin D level - taking daily Can not tolerate calcium

## 2016-03-26 NOTE — Assessment & Plan Note (Signed)
Sees Dr McDiarmid Had cystoscopy, stretching (6 times total), ulcers removed in bladder fall 2017 No pain since Percocet as needed

## 2016-03-26 NOTE — Progress Notes (Signed)
Subjective:    Patient ID: Olivia Evans, female    DOB: 11-Nov-1926, 83 y.o.   MRN: [id number]  HPI She is here to establish with a new pcp.   She is here for a physical exam.   Lives alone.  Still drives.  Three kids are in the area, one in [facility].    She takes hctz as needed for leg swelling.  She does not take it often.   She has interstitial cystitis.  She takes percocet as needed.  She rarely take it - it is prescribed by urology.    She sees her Gyn - Dr Nori Riis and her urologist.    She does not exercise regularly, just her ADL's.  She walks a little in the warmer weather.    Medications and allergies reviewed with patient and updated if appropriate.  Patient Active Problem List   Diagnosis Date Noted  . Diverticulosis of colon without hemorrhage 11/15/2013  . Elevated blood pressure reading without diagnosis of hypertension 11/15/2013  . Vitamin D deficiency 06/30/2012  . INTERSTITIAL CYSTITIS 10/09/2009  . Osteopenia 10/09/2009  . CONSTIPATION 12/13/2007  . History of colonic polyps 12/13/2007  . Hyperlipidemia 08/26/2007  . SYNCOPE 06/24/2006    Current Outpatient Prescriptions on File Prior to Visit  Medication Sig Dispense Refill  . amoxicillin (AMOXIL) 500 MG capsule Take 1 capsule (500 mg total) by mouth 3 (three) times daily. 30 capsule 0  . BIOTIN PO Take 1 capsule by mouth daily.    . Calcium Glycerophosphate (PRELIEF PO) Take 2 tablets by mouth daily. 2 by mouth daily with food    . Cholecalciferol (VITAMIN D3) 1000 UNITS CAPS Take 1 capsule by mouth daily.    . Glucos-Chondroit-Collag-Hyal (JOINT SUPPORT FORMULA PO) Take by mouth.    . hydrochlorothiazide (MICROZIDE) 12.5 MG capsule TAKE 1 CAPSULE (12.5 MG TOTAL) BY MOUTH DAILY WHEN FEET SWELL 90 capsule 1  . magnesium oxide (MAG-OX) 400 MG tablet Take 400 mg by mouth daily.    . Multiple Vitamins-Minerals (ICAPS AREDS 2) CAPS Take 1 capsule by mouth 2 (two) times daily.    Marland Kitchen oxyCODONE-acetaminophen  (PERCOCET/ROXICET) 5-325 MG per tablet Take 1 tablet by mouth every 4 (four) hours as needed for severe pain (every 4 to 6 hours).     Vladimir Faster Glycol-Propyl Glycol (SYSTANE OP) Apply 1 drop to eye daily. To both eyes.    . polyethylene glycol powder (MIRALAX) powder Take 17 g by mouth as needed. For constipation    . senna (SENOKOT) 8.6 MG tablet Take 1 tablet by mouth daily as needed. For constipation     No current facility-administered medications on file prior to visit.     Past Medical History:  Diagnosis Date  . Arthritis KNEES  . Bladder pain   . Chronic constipation   . Diverticulosis 2013   Dr Fuller Plan  . Dyslipidemia   . Heart murmur   . Hemorrhoids   . History of adenomatous polyp of colon    2003 -- VILLOUS  . History of breast cancer 2001---RIGHT BREAST CANCER   S/P PARTIAL MASTECTOMY AND RADIATION--  NO RECURRENCE  . History of palpitations   . Incomplete right bundle branch block (RBBB)   . Interstitial cystitis    Dr McDiarmid  . Lower urinary tract symptoms (LUTS)   . Normal cardiac stress test    2000  PER MD NOTE  . Osteopenia   . Wears dentures    full -upper/  partial lower  . Wears glasses     Past Surgical History:  Procedure Laterality Date  . BENIGN TUMOR REMOVED  1960'S   SECOND RIB  . CATARACT EXTRACTION W/ INTRAOCULAR LENS  IMPLANT, BILATERAL  feb 2016  . CYSTO WITH HYDRODISTENSION  07/29/2011   Procedure: CYSTOSCOPY/HYDRODISTENSION;  Surgeon: Reece Packer, MD;  Location[facility];  Service: Urology;  Laterality: N/A;  with fulguration of bladder ulcer  . CYSTO WITH HYDRODISTENSION N/A 08/28/2014   Procedure: CYSTO/HYDRODISTENSION, FULGURATION OF ULCERS;  Surgeon: Bjorn Loser, MD;  Location: [facility];  Service: Urology;  Laterality: N/A;  . CYSTO/  HYDRODISTENTION/  INSTILLATION THERAPY  x2  01-18- and 11-15- 2011  . CYSTO/ HOD/  BLADDER BX'S/  FULGERATION HUNNER ULCER'S /  INSTILLATION THERAPY   10-01-2010  . PARTIAL MASTECTOMY W/ SLN DISSECTION Right 12-19-1999  . ROTATOR CUFF REPAIR    . TUBAL LIGATION    . VAGINAL HYSTERECTOMY  1970'S   WITH APPENDECTOMY  . VARICOSE VEIN SURGERY      Social History   Social History  . Marital status: Widowed    Spouse name: N/A  . Number of children: 4  . Years of education: N/A   Occupational History  . Retired    Social History Main Topics  . Smoking status: Never Smoker  . Smokeless tobacco: Never Used  . Alcohol use No  . Drug use: No  . Sexual activity: Not on file   Other Topics Concern  . Not on file   Social History Narrative  . No narrative on file    Family History  Problem Relation Age of Onset  . Heart disease Mother      CHF; no MI  . Breast cancer Sister     S/P bilateral mastectomy  . Diabetes Maternal Grandmother   . Thyroid disease      3 daughters  . Stroke Brother 64  . Colon cancer Neg Hx   . Esophageal cancer Neg Hx   . Stomach cancer Neg Hx   . Rectal cancer Neg Hx     Review of Systems  Constitutional: Negative for appetite change, chills, fatigue and fever.  HENT: Positive for rhinorrhea.   Eyes: Negative for visual disturbance.  Respiratory: Negative for cough, shortness of breath and wheezing.   Cardiovascular: Positive for leg swelling (occasional). Negative for chest pain and palpitations.  Gastrointestinal: Positive for constipation (controlled). Negative for abdominal pain, blood in stool, diarrhea and nausea.       No gerd  Genitourinary: Negative for dysuria and hematuria.  Musculoskeletal: Positive for arthralgias (b/l shoulder pain). Negative for back pain.  Skin: Positive for color change (face, leg).  Neurological: Negative for dizziness, light-headedness and headaches.  Psychiatric/Behavioral: Negative for dysphoric mood. The patient is not nervous/anxious.        Objective:   Vitals:   03/26/16 0913  BP: (!) 142/66  Pulse: 85  Resp: 16  Temp: 97.5 F (36.4 C)     Filed Weights   03/26/16 0913  Weight: 123 lb (55.8 kg)   Body mass index is 19.85 kg/m.  Wt Readings from Last 3 Encounters:  03/26/16 123 lb (55.8 kg)  03/01/16 125 lb 8 oz (56.9 kg)  11/17/14 119 lb 8 oz (54.2 kg)     Physical Exam Constitutional: She appears well-developed and well-nourished. No distress.  HENT:  Head: Normocephalic and atraumatic.  Right Ear: External ear normal. Normal ear canal and TM Left  Ear: External ear normal.  Normal ear canal and TM Mouth/Throat: Oropharynx is clear and moist.  Eyes: Conjunctivae and EOM are normal.  Neck: Neck supple. No tracheal deviation present. No thyromegaly present.  No carotid bruit  Cardiovascular: Normal rate, regular rhythm and normal heart sounds.   No murmur heard.  No edema. Pulmonary/Chest: Effort normal and breath sounds normal. No respiratory distress. She has no wheezes. She has no rales.  Breast: deferred to Gyn Abdominal: Soft. She exhibits no distension. There is no tenderness.  Lymphadenopathy: She has no cervical adenopathy.  Skin: Skin is warm and dry. She is not diaphoretic.  Psychiatric: She has a normal mood and affect. Her behavior is normal.         Assessment & Plan:   Physical exam: Screening blood work  ordered Immunizations  prevnar due today  Colonoscopy - no longer needed due to age 73 - no longer needed due to age 61 - no longer needed due to age Dexa  - due - done at gyn's office - will discuss with him this year, has OP Eye exams  Up to date  EKG     done 2013 Exercise -- none right now - just ADL's - walks a little in the warmer weather  Weight - lower BMI but stable Skin - some conerns - wants to see derm Substance abuse  none  See Problem List for Assessment and Plan of chronic medical problems.  FU in one year

## 2016-03-26 NOTE — Progress Notes (Signed)
Pre visit review using our clinic review tool, if applicable. No additional management support is needed unless otherwise documented below in the visit note. 

## 2016-03-26 NOTE — Patient Instructions (Addendum)
Test(s) ordered today. Your results will be released to MyChart (or called to you) after review, usually within 72hours after test completion. If any changes need to be made, you will be notified at that same time.  All other Health Maintenance issues reviewed.   All recommended immunizations and age-appropriate screenings are up-to-date or discussed.  No immunizations administered today.   Medications reviewed and updated.  No changes recommended at this time.   A referral was ordered for dermatology.  Please followup in one year   Health Maintenance, Female Adopting a healthy lifestyle and getting preventive care can go a long way to promote health and wellness. Talk with your health care provider about what schedule of regular examinations is right for you. This is a good chance for you to check in with your provider about disease prevention and staying healthy. In between checkups, there are plenty of things you can do on your own. Experts have done a lot of research about which lifestyle changes and preventive measures are most likely to keep you healthy. Ask your health care provider for more information. Weight and diet Eat a healthy diet  Be sure to include plenty of vegetables, fruits, low-fat dairy products, and lean protein.  Do not eat a lot of foods high in solid fats, added sugars, or salt.  Get regular exercise. This is one of the most important things you can do for your health.  Most adults should exercise for at least 150 minutes each week. The exercise should increase your heart rate and make you sweat (moderate-intensity exercise).  Most adults should also do strengthening exercises at least twice a week. This is in addition to the moderate-intensity exercise. Maintain a healthy weight  Body mass index (BMI) is a measurement that can be used to identify possible weight problems. It estimates body fat based on height and weight. Your health care provider can help  determine your BMI and help you achieve or maintain a healthy weight.  For females 20 years of age and older:  A BMI below 18.5 is considered underweight.  A BMI of 18.5 to 24.9 is normal.  A BMI of 25 to 29.9 is considered overweight.  A BMI of 30 and above is considered obese. Watch levels of cholesterol and blood lipids  You should start having your blood tested for lipids and cholesterol at 81 years of age, then have this test every 5 years.  You may need to have your cholesterol levels checked more often if:  Your lipid or cholesterol levels are high.  You are older than 81 years of age.  You are at high risk for heart disease. Cancer screening Lung Cancer  Lung cancer screening is recommended for adults 55-80 years old who are at high risk for lung cancer because of a history of smoking.  A yearly low-dose CT scan of the lungs is recommended for people who:  Currently smoke.  Have quit within the past 15 years.  Have at least a 30-pack-year history of smoking. A pack year is smoking an average of one pack of cigarettes a day for 1 year.  Yearly screening should continue until it has been 15 years since you quit.  Yearly screening should stop if you develop a health problem that would prevent you from having lung cancer treatment. Breast Cancer  Practice breast self-awareness. This means understanding how your breasts normally appear and feel.  It also means doing regular breast self-exams. Let your health care provider know   know about any changes, no matter how small.  If you are in your 20s or 30s, you should have a clinical breast exam (CBE) by a health care provider every 1-3 years as part of a regular health exam.  If you are 36 or older, have a CBE every year. Also consider having a breast X-ray (mammogram) every year.  If you have a family history of breast cancer, talk to your health care provider about genetic screening.  If you are at high risk for breast  cancer, talk to your health care provider about having an MRI and a mammogram every year.  Breast cancer gene (BRCA) assessment is recommended for women who have family members with BRCA-related cancers. BRCA-related cancers include:  Breast.  Ovarian.  Tubal.  Peritoneal cancers.  Results of the assessment will determine the need for genetic counseling and BRCA1 and BRCA2 testing. Cervical Cancer  Your health care provider may recommend that you be screened regularly for cancer of the pelvic organs (ovaries, uterus, and vagina). This screening involves a pelvic examination, including checking for microscopic changes to the surface of your cervix (Pap test). You may be encouraged to have this screening done every 3 years, beginning at age 102.  For women ages 84-65, health care providers may recommend pelvic exams and Pap testing every 3 years, or they may recommend the Pap and pelvic exam, combined with testing for human papilloma virus (HPV), every 5 years. Some types of HPV increase your risk of cervical cancer. Testing for HPV may also be done on women of any age with unclear Pap test results.  Other health care providers may not recommend any screening for nonpregnant women who are considered low risk for pelvic cancer and who do not have symptoms. Ask your health care provider if a screening pelvic exam is right for you.  If you have had past treatment for cervical cancer or a condition that could lead to cancer, you need Pap tests and screening for cancer for at least 20 years after your treatment. If Pap tests have been discontinued, your risk factors (such as having a new sexual partner) need to be reassessed to determine if screening should resume. Some women have medical problems that increase the chance of getting cervical cancer. In these cases, your health care provider may recommend more frequent screening and Pap tests. Colorectal Cancer  This type of cancer can be detected and  often prevented.  Routine colorectal cancer screening usually begins at 81 years of age and continues through 81 years of age.  Your health care provider may recommend screening at an earlier age if you have risk factors for colon cancer.  Your health care provider may also recommend using home test kits to check for hidden blood in the stool.  A small camera at the end of a tube can be used to examine your colon directly (sigmoidoscopy or colonoscopy). This is done to check for the earliest forms of colorectal cancer.  Routine screening usually begins at age 10.  Direct examination of the colon should be repeated every 5-10 years through 81 years of age. However, you may need to be screened more often if early forms of precancerous polyps or small growths are found. Skin Cancer  Check your skin from head to toe regularly.  Tell your health care provider about any new moles or changes in moles, especially if there is a change in a mole's shape or color.  Also tell your health care provider  if you have a mole that is larger than the size of a pencil eraser.  Always use sunscreen. Apply sunscreen liberally and repeatedly throughout the day.  Protect yourself by wearing long sleeves, pants, a wide-brimmed hat, and sunglasses whenever you are outside. Heart disease, diabetes, and high blood pressure  High blood pressure causes heart disease and increases the risk of stroke. High blood pressure is more likely to develop in:  People who have blood pressure in the high end of the normal range (130-139/85-89 mm Hg).  People who are overweight or obese.  People who are African American.  If you are 39-49 years of age, have your blood pressure checked every 3-5 years. If you are 55 years of age or older, have your blood pressure checked every year. You should have your blood pressure measured twice-once when you are at a hospital or clinic, and once when you are not at a hospital or clinic.  Record the average of the two measurements. To check your blood pressure when you are not at a hospital or clinic, you can use:  An automated blood pressure machine at a pharmacy.  A home blood pressure monitor.  If you are between 18 years and 69 years old, ask your health care provider if you should take aspirin to prevent strokes.  Have regular diabetes screenings. This involves taking a blood sample to check your fasting blood sugar level.  If you are at a normal weight and have a low risk for diabetes, have this test once every three years after 81 years of age.  If you are overweight and have a high risk for diabetes, consider being tested at a younger age or more often. Preventing infection Hepatitis B  If you have a higher risk for hepatitis B, you should be screened for this virus. You are considered at high risk for hepatitis B if:  You were born in a country where hepatitis B is common. Ask your health care provider which countries are considered high risk.  Your parents were born in a high-risk country, and you have not been immunized against hepatitis B (hepatitis B vaccine).  You have HIV or AIDS.  You use needles to inject street drugs.  You live with someone who has hepatitis B.  You have had sex with someone who has hepatitis B.  You get hemodialysis treatment.  You take certain medicines for conditions, including cancer, organ transplantation, and autoimmune conditions. Hepatitis C  Blood testing is recommended for:  Everyone born from [birth year] through 1963.  Anyone with known risk factors for hepatitis C. Sexually transmitted infections (STIs)  You should be screened for sexually transmitted infections (STIs) including gonorrhea and chlamydia if:  You are sexually active and are younger than 81 years of age.  You are older than 81 years of age and your health care provider tells you that you are at risk for this type of infection.  Your sexual activity  has changed since you were last screened and you are at an increased risk for chlamydia or gonorrhea. Ask your health care provider if you are at risk.  If you do not have HIV, but are at risk, it may be recommended that you take a prescription medicine daily to prevent HIV infection. This is called pre-exposure prophylaxis (PrEP). You are considered at risk if:  You are sexually active and do not regularly use condoms or know the HIV status of your partner(s).  You take drugs by injection.  You are sexually active with a partner who has HIV. Talk with your health care provider about whether you are at high risk of being infected with HIV. If you choose to begin PrEP, you should first be tested for HIV. You should then be tested every 3 months for as long as you are taking PrEP. Pregnancy  If you are premenopausal and you may become pregnant, ask your health care provider about preconception counseling.  If you may become pregnant, take 400 to 800 micrograms (mcg) of folic acid every day.  If you want to prevent pregnancy, talk to your health care provider about birth control (contraception). Osteoporosis and menopause  Osteoporosis is a disease in which the bones lose minerals and strength with aging. This can result in serious bone fractures. Your risk for osteoporosis can be identified using a bone density scan.  If you are 82 years of age or older, or if you are at risk for osteoporosis and fractures, ask your health care provider if you should be screened.  Ask your health care provider whether you should take a calcium or vitamin D supplement to lower your risk for osteoporosis.  Menopause may have certain physical symptoms and risks.  Hormone replacement therapy may reduce some of these symptoms and risks. Talk to your health care provider about whether hormone replacement therapy is right for you. Follow these instructions at home:  Schedule regular health, dental, and eye  exams.  Stay current with your immunizations.  Do not use any tobacco products including cigarettes, chewing tobacco, or electronic cigarettes.  If you are pregnant, do not drink alcohol.  If you are breastfeeding, limit how much and how often you drink alcohol.  Limit alcohol intake to no more than 1 drink per day for nonpregnant women. One drink equals 12 ounces of beer, 5 ounces of wine, or 1 ounces of hard liquor.  Do not use street drugs.  Do not share needles.  Ask your health care provider for help if you need support or information about quitting drugs.  Tell your health care provider if you often feel depressed.  Tell your health care provider if you have ever been abused or do not feel safe at home. This information is not intended to replace advice given to you by your health care provider. Make sure you discuss any questions you have with your health care provider. Document Released: 07/22/2010 Document Revised: 06/14/2015 Document Reviewed: 10/10/2014 Elsevier Interactive Patient Education  2017 [employer].

## 2016-03-26 NOTE — Assessment & Plan Note (Signed)
Taking vitamin d Check level 

## 2016-03-26 NOTE — Assessment & Plan Note (Signed)
Chronic, controlled with current regimen

## 2016-03-30 ENCOUNTER — Encounter: Payer: Self-pay | Admitting: Internal Medicine

## 2016-04-09 DIAGNOSIS — L821 Other seborrheic keratosis: Secondary | ICD-10-CM | POA: Diagnosis not present

## 2016-04-09 DIAGNOSIS — L570 Actinic keratosis: Secondary | ICD-10-CM | POA: Diagnosis not present

## 2016-05-21 DIAGNOSIS — L910 Hypertrophic scar: Secondary | ICD-10-CM | POA: Diagnosis not present

## 2016-05-21 DIAGNOSIS — D179 Benign lipomatous neoplasm, unspecified: Secondary | ICD-10-CM | POA: Diagnosis not present

## 2016-09-08 DIAGNOSIS — Z1231 Encounter for screening mammogram for malignant neoplasm of breast: Secondary | ICD-10-CM | POA: Diagnosis not present

## 2016-09-08 DIAGNOSIS — Z6821 Body mass index (BMI) 21.0-21.9, adult: Secondary | ICD-10-CM | POA: Diagnosis not present

## 2016-09-08 DIAGNOSIS — Z01419 Encounter for gynecological examination (general) (routine) without abnormal findings: Secondary | ICD-10-CM | POA: Diagnosis not present

## 2016-10-09 ENCOUNTER — Telehealth: Payer: Self-pay | Admitting: Internal Medicine

## 2016-10-09 NOTE — Telephone Encounter (Signed)
Called pt to schedule AWV. Lvm for pt to call office to schedule appt. Last AWV 11/01/2014. Pt can schedule AWV at anytime. SF

## 2016-10-10 ENCOUNTER — Telehealth: Payer: Self-pay | Admitting: Internal Medicine

## 2016-10-10 NOTE — Telephone Encounter (Signed)
Spoke with pt on today. Pt stated that she would like to have labs drawn to check her Vitamin D levels and she  also feels that her blood pressure maybe low. She stated that she is feeling tired and would like for nurse to give her a call.

## 2016-10-10 NOTE — Telephone Encounter (Signed)
Pt called office today regarding AWV. Explained to pt the details of AWV and what the visit covers. Pt stated that she does not want to schedule appt at this time.

## 2016-10-13 NOTE — Telephone Encounter (Signed)
Patient has scheduled appt with dr burns on 10/15/16

## 2016-10-14 NOTE — Progress Notes (Signed)
Subjective:    Patient ID: Olivia Evans, female    DOB: March 07, 1926, 83 y.o.   MRN: [id number]  HPI She is here for an acute visit.   Fatigue, SOB:  If she goes outside this summer she has had SOB  She thought it was the humidity.  She would feel this inside at times, but it was worse outside.  She is feels fatigued more easily recently, the past 3-4 months.  She has to sit down after doing some household activities.  At times she would feel weak for no reason and have tremors.  The past couple of weeks she would feel her heart beat abnormally and it was transient.  She denies chest pain.      Medications and allergies reviewed with patient and updated if appropriate.  Patient Active Problem List   Diagnosis Date Noted  . Osteoporosis 03/26/2016  . Diverticulosis of colon without hemorrhage 11/15/2013  . Vitamin D deficiency 06/30/2012  . INTERSTITIAL CYSTITIS 10/09/2009  . Constipation 12/13/2007  . History of colonic polyps 12/13/2007  . Hyperlipidemia 08/26/2007  . SYNCOPE 06/24/2006    Current Outpatient Prescriptions on File Prior to Visit  Medication Sig Dispense Refill  . BIOTIN PO Take 1 capsule by mouth daily.    . Calcium Glycerophosphate (PRELIEF PO) Take 2 tablets by mouth daily. 2 by mouth daily with food    . Cholecalciferol (VITAMIN D3) 1000 UNITS CAPS Take 1 capsule by mouth daily.    . Glucos-Chondroit-Collag-Hyal (JOINT SUPPORT FORMULA PO) Take by mouth.    . hydrochlorothiazide (MICROZIDE) 12.5 MG capsule TAKE 1 CAPSULE (12.5 MG TOTAL) BY MOUTH DAILY WHEN FEET SWELL 90 capsule 1  . magnesium oxide (MAG-OX) 400 MG tablet Take 400 mg by mouth daily.    . Multiple Vitamins-Minerals (ICAPS AREDS 2) CAPS Take 1 capsule by mouth 2 (two) times daily.    Marland Kitchen oxyCODONE-acetaminophen (PERCOCET/ROXICET) 5-325 MG per tablet Take 1 tablet by mouth every 4 (four) hours as needed for severe pain (every 4 to 6 hours).     Vladimir Faster Glycol-Propyl Glycol (SYSTANE OP) Apply 1  drop to eye daily. To both eyes.    . polyethylene glycol powder (MIRALAX) powder Take 17 g by mouth as needed. For constipation    . senna (SENOKOT) 8.6 MG tablet Take 1 tablet by mouth daily as needed. For constipation     No current facility-administered medications on file prior to visit.     Past Medical History:  Diagnosis Date  . Arthritis KNEES  . Bladder pain   . Chronic constipation   . Diverticulosis 2013   Dr Fuller Plan  . Dyslipidemia   . Heart murmur   . Hemorrhoids   . History of adenomatous polyp of colon    2003 -- VILLOUS  . History of breast cancer 2001---RIGHT BREAST CANCER   S/P PARTIAL MASTECTOMY AND RADIATION--  NO RECURRENCE  . History of palpitations   . Incomplete right bundle branch block (RBBB)   . Interstitial cystitis    Dr McDiarmid  . Lower urinary tract symptoms (LUTS)   . Normal cardiac stress test    2000  PER MD NOTE  . Osteopenia   . Wears dentures    full -upper/  partial lower  . Wears glasses     Past Surgical History:  Procedure Laterality Date  . BENIGN TUMOR REMOVED  1960'S   SECOND RIB  . CATARACT EXTRACTION W/ INTRAOCULAR LENS  IMPLANT, BILATERAL  feb  2016  . CYSTO WITH HYDRODISTENSION  07/29/2011   Procedure: CYSTOSCOPY/HYDRODISTENSION;  Surgeon: Reece Packer, MD;  Location: [facility];  Service: Urology;  Laterality: N/A;  with fulguration of bladder ulcer  . CYSTO WITH HYDRODISTENSION N/A 08/28/2014   Procedure: CYSTO/HYDRODISTENSION, FULGURATION OF ULCERS;  Surgeon: Bjorn Loser, MD;  Location: [facility];  Service: Urology;  Laterality: N/A;  . CYSTO/  HYDRODISTENTION/  INSTILLATION THERAPY  x2  01-18- and 11-15- 2011  . CYSTO/ HOD/  BLADDER BX'S/  FULGERATION HUNNER ULCER'S /  INSTILLATION THERAPY  10-01-2010  . PARTIAL MASTECTOMY W/ SLN DISSECTION Right 12-19-1999  . ROTATOR CUFF REPAIR    . TUBAL LIGATION    . VAGINAL HYSTERECTOMY  1970'S   WITH APPENDECTOMY  . VARICOSE VEIN SURGERY       Social History   Social History  . Marital status: Widowed    Spouse name: N/A  . Number of children: 4  . Years of education: N/A   Occupational History  . Retired    Social History Main Topics  . Smoking status: Never Smoker  . Smokeless tobacco: Never Used  . Alcohol use No  . Drug use: No  . Sexual activity: Not on file   Other Topics Concern  . Not on file   Social History Narrative  . No narrative on file    Family History  Problem Relation Age of Onset  . Heart disease Mother         CHF; no MI  . Breast cancer Sister        S/P bilateral mastectomy  . Diabetes Maternal Grandmother   . Thyroid disease Unknown        3 daughters  . Stroke Brother 64  . Colon cancer Neg Hx   . Esophageal cancer Neg Hx   . Stomach cancer Neg Hx   . Rectal cancer Neg Hx     Review of Systems  Constitutional: Negative for chills and fever.  HENT: Positive for rhinorrhea and voice change. Negative for congestion, postnasal drip and sore throat.   Respiratory: Positive for cough (occ can get mucus up) and shortness of breath. Negative for wheezing.   Cardiovascular: Positive for palpitations and leg swelling (occ, no changed). Negative for chest pain.  Gastrointestinal: Positive for constipation. Negative for diarrhea and nausea.  Neurological: Negative for light-headedness and headaches.       Objective:   Vitals:   10/15/16 1042  BP: 134/76  Pulse: 73  Resp: 16  Temp: 97.9 F (36.6 C)  SpO2: 98%   Filed Weights   10/15/16 1042  Weight: 127 lb (57.6 kg)   Body mass index is 20.5 kg/m.  Wt Readings from Last 3 Encounters:  10/15/16 127 lb (57.6 kg)  03/26/16 123 lb (55.8 kg)  03/01/16 125 lb 8 oz (56.9 kg)     Physical Exam Constitutional: Appears well-developed and well-nourished. No distress.  HENT:  Head: Normocephalic and atraumatic.  Neck: Neck supple. No tracheal deviation present. No thyromegaly present.  No cervical  lymphadenopathy Cardiovascular: Normal rate, regular rhythm and normal heart sounds.   No murmur heard. No carotid bruit .  No edema Pulmonary/Chest: Effort normal and breath sounds normal. No respiratory distress. No has no wheezes. No rales.  Abdomen: soft, non tender, non distended Skin: Skin is warm and dry. Not diaphoretic.  Psychiatric: Normal mood and affect. Behavior is normal.         Assessment & Plan:  See Problem List for Assessment and Plan of chronic medical problems.

## 2016-10-15 ENCOUNTER — Encounter: Payer: Self-pay | Admitting: Internal Medicine

## 2016-10-15 ENCOUNTER — Other Ambulatory Visit (INDEPENDENT_AMBULATORY_CARE_PROVIDER_SITE_OTHER): Payer: PPO

## 2016-10-15 ENCOUNTER — Ambulatory Visit (INDEPENDENT_AMBULATORY_CARE_PROVIDER_SITE_OTHER): Payer: PPO | Admitting: Internal Medicine

## 2016-10-15 VITALS — BP 134/76 | HR 73 | Temp 97.9°F | Resp 16 | Ht 66.0 in | Wt 127.0 lb

## 2016-10-15 DIAGNOSIS — R002 Palpitations: Secondary | ICD-10-CM

## 2016-10-15 DIAGNOSIS — Z23 Encounter for immunization: Secondary | ICD-10-CM

## 2016-10-15 DIAGNOSIS — E559 Vitamin D deficiency, unspecified: Secondary | ICD-10-CM

## 2016-10-15 DIAGNOSIS — R5383 Other fatigue: Secondary | ICD-10-CM

## 2016-10-15 DIAGNOSIS — R0602 Shortness of breath: Secondary | ICD-10-CM | POA: Diagnosis not present

## 2016-10-15 LAB — COMPREHENSIVE METABOLIC PANEL
ALT: 12 U/L (ref 0–35)
AST: 15 U/L (ref 0–37)
Albumin: 4.4 g/dL (ref 3.5–5.2)
Alkaline Phosphatase: 58 U/L (ref 39–117)
BILIRUBIN TOTAL: 0.8 mg/dL (ref 0.2–1.2)
BUN: 12 mg/dL (ref 6–23)
CALCIUM: 10.2 mg/dL (ref 8.4–10.5)
CO2: 32 meq/L (ref 19–32)
CREATININE: 0.69 mg/dL (ref 0.40–1.20)
Chloride: 103 mEq/L (ref 96–112)
GFR: 85.22 mL/min (ref >60.00)
GLUCOSE: 97 mg/dL (ref 70–99)
Potassium: 4 mEq/L (ref 3.5–5.1)
Sodium: 140 mEq/L (ref 135–145)
Total Protein: 6.6 g/dL (ref 6.0–8.3)

## 2016-10-15 LAB — CBC WITH DIFFERENTIAL/PLATELET
BASOS PCT: 2.3 % (ref 0.0–3.0)
Basophils Absolute: 0.1 10*3/uL (ref 0.0–0.1)
EOS PCT: 5.9 % — AB (ref 0.0–5.0)
Eosinophils Absolute: 0.2 10*3/uL (ref 0.0–0.7)
HCT: 35.9 % — ABNORMAL LOW (ref 36.0–46.0)
Hemoglobin: 12 g/dL (ref 12.0–15.0)
LYMPHS ABS: 1.1 10*3/uL (ref 0.7–4.0)
Lymphocytes Relative: 34.6 % (ref 12.0–46.0)
MCHC: 33.4 g/dL (ref 30.0–36.0)
MCV: 105.1 fl — ABNORMAL HIGH (ref 78.0–100.0)
MONO ABS: 0.2 10*3/uL (ref 0.1–1.0)
Monocytes Relative: 7.2 % (ref 3.0–12.0)
NEUTROS ABS: 1.6 10*3/uL (ref 1.4–7.7)
NEUTROS PCT: 50 % (ref 43.0–77.0)
PLATELETS: 214 10*3/uL (ref 150.0–400.0)
RBC: 3.42 Mil/uL — ABNORMAL LOW (ref 3.87–5.11)
RDW: 14 % (ref 11.5–15.5)
WBC: 3.3 10*3/uL — ABNORMAL LOW (ref 4.0–10.5)

## 2016-10-15 LAB — MAGNESIUM: Magnesium: 1.9 mg/dL (ref 1.5–2.5)

## 2016-10-15 LAB — TSH: TSH: 1.02 u[IU]/mL (ref 0.35–4.50)

## 2016-10-15 LAB — VITAMIN D 25 HYDROXY (VIT D DEFICIENCY, FRACTURES): VITD: 39.57 ng/mL (ref 30.00–100.00)

## 2016-10-15 NOTE — Patient Instructions (Signed)
  Test(s) ordered today. Your results will be released to [place] (or called to you) after review, usually within 72hours after test completion. If any changes need to be made, you will be notified at that same time.  All other Health Maintenance issues reviewed.   All recommended immunizations and age-appropriate screenings are up-to-date or discussed.  Flu immunization administered today.   Medications reviewed and updated.   No changes recommended at this time.   A referral was ordered for cardiology

## 2016-10-15 NOTE — Assessment & Plan Note (Signed)
EKG today - NSR at 68 bpm, RSR V1-2, normal EKG, no change from 2013 Will check labs - cbc, cmp, tsh Will refer to cardiology for further evaluation and possible stress test

## 2016-10-15 NOTE — Assessment & Plan Note (Signed)
Will check labs - cbc, cmp, tsh Will refer to cardiology for further evaluation and possible stress test EKG normal today

## 2016-10-15 NOTE — Assessment & Plan Note (Signed)
Will check vitamin d level since she is having blood work

## 2016-10-15 NOTE — Assessment & Plan Note (Addendum)
Worse outside with humidity EKG today - NSR at 68 bpm, RSR V1-2, normal EKG, no change from 2013 Check labs and refer to cardio

## 2016-10-16 ENCOUNTER — Other Ambulatory Visit: Payer: Self-pay | Admitting: Internal Medicine

## 2016-10-16 DIAGNOSIS — R413 Other amnesia: Secondary | ICD-10-CM

## 2016-10-17 ENCOUNTER — Encounter: Payer: Self-pay | Admitting: Psychology

## 2016-10-17 ENCOUNTER — Encounter: Payer: Self-pay | Admitting: Internal Medicine

## 2016-10-17 ENCOUNTER — Ambulatory Visit (INDEPENDENT_AMBULATORY_CARE_PROVIDER_SITE_OTHER): Payer: PPO | Admitting: Internal Medicine

## 2016-10-17 VITALS — BP 146/64 | HR 78 | Ht 66.0 in | Wt 129.8 lb

## 2016-10-17 DIAGNOSIS — R5383 Other fatigue: Secondary | ICD-10-CM

## 2016-10-17 DIAGNOSIS — R0602 Shortness of breath: Secondary | ICD-10-CM

## 2016-10-17 NOTE — Progress Notes (Signed)
OFFICE CONSULT NOTE  Chief Complaint:  Fatigue, dyspnea on exertion  Primary Care Physician: [name], MD  HPI:  Olivia Evans is a 83 y.o. female who is being seen today for the evaluation of the above complaints at the request of Burns, Claudina Lick, MD. This is a pleasant 83 year old female who has relatively few medical problems. She has have a history of heart murmur in the past as well as interstitial cystitis which is been treated, arthritis and some chronic constipation. She is on hydrochlorothiazide for edema, but otherwise takes no medications. There is no significant heart disease in her family. Her brother did have a stroke. Recently she's felt more weak and tired as well as short winded. Up until a few months ago she worked in the garden and did a lot of yard work but now finds it very difficult to do even simple tasks. She gets short of breath doing laundry or walking up stairs. She denies any chest pain or heaviness.  PMHx:  Past Medical History:  Diagnosis Date  . Arthritis KNEES  . Bladder pain   . Chronic constipation   . Diverticulosis 2013   Dr Fuller Plan  . Dyslipidemia   . Heart murmur   . Hemorrhoids   . History of adenomatous polyp of colon    2003 -- VILLOUS  . History of breast cancer 2001---RIGHT BREAST CANCER   S/P PARTIAL MASTECTOMY AND RADIATION--  NO RECURRENCE  . History of palpitations   . Incomplete right bundle branch block (RBBB)   . Interstitial cystitis    Dr McDiarmid  . Lower urinary tract symptoms (LUTS)   . Normal cardiac stress test    2000  PER MD NOTE  . Osteopenia   . Wears dentures    full -upper/  partial lower  . Wears glasses     Past Surgical History:  Procedure Laterality Date  . BENIGN TUMOR REMOVED  1960'S   SECOND RIB  . CATARACT EXTRACTION W/ INTRAOCULAR LENS  IMPLANT, BILATERAL  feb 2016  . CYSTO WITH HYDRODISTENSION  07/29/2011   Procedure: CYSTOSCOPY/HYDRODISTENSION;  Surgeon: Reece Packer, MD;  Location:  [facility];  Service: Urology;  Laterality: N/A;  with fulguration of bladder ulcer  . CYSTO WITH HYDRODISTENSION N/A 08/28/2014   Procedure: CYSTO/HYDRODISTENSION, FULGURATION OF ULCERS;  Surgeon: Bjorn Loser, MD;  Location: [facility];  Service: Urology;  Laterality: N/A;  . CYSTO/  HYDRODISTENTION/  INSTILLATION THERAPY  x2  01-18- and 11-15- 2011  . CYSTO/ HOD/  BLADDER BX'S/  FULGERATION HUNNER ULCER'S /  INSTILLATION THERAPY  10-01-2010  . PARTIAL MASTECTOMY W/ SLN DISSECTION Right 12-19-1999  . ROTATOR CUFF REPAIR    . TUBAL LIGATION    . VAGINAL HYSTERECTOMY  1970'S   WITH APPENDECTOMY  . VARICOSE VEIN SURGERY      FAMHx:  Family History  Problem Relation Age of Onset  . Heart disease Mother         CHF; no MI  . Breast cancer Sister        S/P bilateral mastectomy  . Diabetes Maternal Grandmother   . Thyroid disease Unknown        3 daughters  . Stroke Brother 60  . Colon cancer Neg Hx   . Esophageal cancer Neg Hx   . Stomach cancer Neg Hx   . Rectal cancer Neg Hx     SOCHx:   reports that she has never smoked. She has  never used smokeless tobacco. She reports that she does not drink alcohol or use drugs.  ALLERGIES:  Allergies  Allergen Reactions  . Premarin [Conjugated Estrogens] Other (See Comments)    TABS--  CAUSE PHLEBITIS  . Vicodin [Hydrocodone-Acetaminophen] Itching    SEVERE  . Adhesive [Tape] Other (See Comments)    IRRITATION  . Fluvastatin Sodium Other (See Comments)    HEART PALPITATIONS  . Simvastatin Other (See Comments)    SEVERE LEG ACHES    ROS: Pertinent items noted in HPI and remainder of comprehensive ROS otherwise negative.  HOME MEDS: Current Outpatient Prescriptions on File Prior to Visit  Medication Sig Dispense Refill  . BIOTIN PO Take 1 capsule by mouth daily.    . Calcium Glycerophosphate (PRELIEF PO) Take 2 tablets by mouth daily. 2 by mouth daily with food    . Cholecalciferol (VITAMIN  D3) 1000 UNITS CAPS Take 2 capsules by mouth daily.    . Glucos-Chondroit-Collag-Hyal (JOINT SUPPORT FORMULA PO) Take by mouth.    . hydrochlorothiazide (MICROZIDE) 12.5 MG capsule TAKE 1 CAPSULE (12.5 MG TOTAL) BY MOUTH DAILY WHEN FEET SWELL 90 capsule 1  . magnesium oxide (MAG-OX) 400 MG tablet Take 800 mg by mouth daily.    . Multiple Vitamins-Minerals (ICAPS AREDS 2) CAPS Take 1 capsule by mouth 2 (two) times daily.    Vladimir Faster Glycol-Propyl Glycol (SYSTANE OP) Apply 1 drop to eye daily. To both eyes.    . polyethylene glycol powder (MIRALAX) powder Take 17 g by mouth as needed. For constipation    . senna (SENOKOT) 8.6 MG tablet Take 1 tablet by mouth daily as needed. For constipation     No current facility-administered medications on file prior to visit.     LABS/IMAGING: No results found for this or any previous visit (from the past 48 hour(s)). No results found.  LIPID PANEL:    Component Value Date/Time   CHOL 191 03/26/2016 1008   TRIG 63.0 03/26/2016 1008   HDL 72.10 03/26/2016 1008   CHOLHDL 3 03/26/2016 1008   VLDL 12.6 03/26/2016 1008   LDLCALC 106 (H) 03/26/2016 1008   LDLDIRECT 104.5 04/01/2011 1212    WEIGHTS: Wt Readings from Last 3 Encounters:  10/17/16 129 lb 12.8 oz (58.9 kg)  10/15/16 127 lb (57.6 kg)  03/26/16 123 lb (55.8 kg)    VITALS: BP (!) 146/64   Pulse 78   Ht 5\' 6"  (1.676 m)   Wt 129 lb 12.8 oz (58.9 kg)   BMI 20.95 kg/m   EXAM: General appearance: alert and no distress Neck: no carotid bruit, no JVD and thyroid not enlarged, symmetric, no tenderness/mass/nodules Lungs: clear to auscultation bilaterally Heart: regular rate and rhythm, S1, S2 normal, no murmur, click, rub or gallop Abdomen: soft, non-tender; bowel sounds normal; no masses,  no organomegaly Extremities: extremities normal, atraumatic, no cyanosis or edema Pulses: 2+ and symmetric Skin: Skin color, texture, turgor normal. No rashes or lesions Neurologic: Grossly  normal Psych: Pleasant  EKG: Sinus rhythm at 78, incomplete right bundle branch block, poor R-wave progression-personally reviewed - personally reviewed  ASSESSMENT: 1. Progressive fatigue and dyspnea on exertion  PLAN: 1.   Mrs. others describing progressive fatigue and dyspnea on exertion which has been progressive over the past several months. She can now do very few activities. She's apparently had an extensive workup by her primary care provider including lab work which is been unrevealing. I'm recommending a repeat stress test. She last had a stress test  more than 10 years ago which was apparently negative. Follow-up with me afterwards.  Thanks for the kind referral.  Pixie Casino, MD, [place]  Attending Cardiologist  Direct Dial: [phone]  Fax: [phone]  Website:  [email or web address] 10/17/2016, 4:29 PM

## 2016-10-17 NOTE — Patient Instructions (Signed)
Your physician has requested that you have a lexiscan myoview. For further information please visit [email or web address]. Please follow instruction sheet, as given.  Your physician recommends that you schedule a follow-up appointment with Dr. Hilty after your stress test.   

## 2016-10-20 ENCOUNTER — Telehealth: Payer: Self-pay | Admitting: Internal Medicine

## 2016-10-20 NOTE — Telephone Encounter (Signed)
error 

## 2016-10-20 NOTE — Telephone Encounter (Deleted)
New message     Daughter Olivia Evans states  [place] is requesting a call from  Dr Debara Pickett to obtain information  regarding procedure. Please call Olivia Evans

## 2016-10-21 ENCOUNTER — Telehealth (HOSPITAL_COMMUNITY): Payer: Self-pay

## 2016-10-21 NOTE — Telephone Encounter (Signed)
Encounter complete. 

## 2016-10-22 ENCOUNTER — Ambulatory Visit (HOSPITAL_COMMUNITY)
Admission: RE | Admit: 2016-10-22 | Discharge: 2016-10-22 | Disposition: A | Discharge status: Home / Self Care | Payer: PPO | Source: Ambulatory Visit | Attending: Cardiovascular Disease | Admitting: Cardiovascular Disease

## 2016-10-22 DIAGNOSIS — R0602 Shortness of breath: Secondary | ICD-10-CM | POA: Insufficient documentation

## 2016-10-22 DIAGNOSIS — R5383 Other fatigue: Secondary | ICD-10-CM | POA: Diagnosis not present

## 2016-10-22 LAB — MYOCARDIAL PERFUSION IMAGING
CHL CUP NUCLEAR SDS: 3
LV dias vol: 70 mL (ref 46–106)
LV sys vol: 24 mL
NUC STRESS TID: 0.99
Peak HR: 121 {beats}/min
Rest HR: 74 {beats}/min
SRS: 3
SSS: 4

## 2016-10-22 MED ORDER — TECHNETIUM TC 99M TETROFOSMIN IV KIT
27.6000 | PACK | Freq: Once | INTRAVENOUS | Status: AC | PRN
  Administered 2016-10-22: 27.6 via INTRAVENOUS
  Filled 2016-10-22: qty 28

## 2016-10-22 MED ORDER — REGADENOSON 0.4 MG/5ML IV SOLN
0.4000 mg | Freq: Once | INTRAVENOUS | Status: AC
  Administered 2016-10-22: 0.4 mg via INTRAVENOUS

## 2016-10-22 MED ORDER — TECHNETIUM TC 99M TETROFOSMIN IV KIT
10.8000 | PACK | Freq: Once | INTRAVENOUS | Status: AC | PRN
  Administered 2016-10-22: 10.8 via INTRAVENOUS
  Filled 2016-10-22: qty 11

## 2016-11-21 ENCOUNTER — Encounter: Payer: Self-pay | Admitting: Internal Medicine

## 2016-11-21 ENCOUNTER — Ambulatory Visit (INDEPENDENT_AMBULATORY_CARE_PROVIDER_SITE_OTHER): Payer: PPO | Admitting: Internal Medicine

## 2016-11-21 VITALS — BP 132/60 | HR 74 | Ht 66.0 in | Wt 131.0 lb

## 2016-11-21 DIAGNOSIS — I10 Essential (primary) hypertension: Secondary | ICD-10-CM

## 2016-11-21 NOTE — Patient Instructions (Signed)
Your physician recommends that you schedule a follow-up appointment as needed with Dr. Hilty.  

## 2016-11-23 ENCOUNTER — Encounter: Payer: Self-pay | Admitting: Internal Medicine

## 2016-11-23 DIAGNOSIS — I10 Essential (primary) hypertension: Secondary | ICD-10-CM | POA: Insufficient documentation

## 2016-11-23 NOTE — Progress Notes (Signed)
OFFICE CONSULT NOTE  Chief Complaint:  No complaints  Primary Care Physician: [name], MD  HPI:  Olivia Evans is a 83 y.o. female who is being seen today for the evaluation of the above complaints at the request of Burns, Claudina Lick, MD. This is a pleasant 82 year old female who has relatively few medical problems. She has have a history of heart murmur in the past as well as interstitial cystitis which is been treated, arthritis and some chronic constipation. She is on hydrochlorothiazide for edema, but otherwise takes no medications. There is no significant heart disease in her family. Her brother did have a stroke. Recently she's felt more weak and tired as well as short winded. Up until a few months ago she worked in the garden and did a lot of yard work but now finds it very difficult to do even simple tasks. She gets short of breath doing laundry or walking up stairs. She denies any chest pain or heaviness.  11/21/2016  Olivia Evans returns today for follow-up.  Overall she seems to be feeling well.  She denies any worsening shortness of breath or chest pain.  Blood pressure is been well controlled.  She is currently only on low-dose hydrochlorothiazide.  PMHx:  Past Medical History:  Diagnosis Date  . Arthritis KNEES  . Bladder pain   . Chronic constipation   . Diverticulosis 2013   Dr Fuller Plan  . Dyslipidemia   . Heart murmur   . Hemorrhoids   . History of adenomatous polyp of colon    2003 -- VILLOUS  . History of breast cancer 2001---RIGHT BREAST CANCER   S/P PARTIAL MASTECTOMY AND RADIATION--  NO RECURRENCE  . History of palpitations   . Incomplete right bundle branch block (RBBB)   . Interstitial cystitis    Dr McDiarmid  . Lower urinary tract symptoms (LUTS)   . Normal cardiac stress test    2000  PER MD NOTE  . Osteopenia   . Wears dentures    full -upper/  partial lower  . Wears glasses     Past Surgical History:  Procedure Laterality Date  . BENIGN  TUMOR REMOVED  1960'S   SECOND RIB  . CATARACT EXTRACTION W/ INTRAOCULAR LENS  IMPLANT, BILATERAL  feb 2016  . CYSTO/  HYDRODISTENTION/  INSTILLATION THERAPY  x2  01-18- and 11-15- 2011  . CYSTO/ HOD/  BLADDER BX'S/  FULGERATION HUNNER ULCER'S /  INSTILLATION THERAPY  10-01-2010  . PARTIAL MASTECTOMY W/ SLN DISSECTION Right 12-19-1999  . ROTATOR CUFF REPAIR    . TUBAL LIGATION    . VAGINAL HYSTERECTOMY  1970'S   WITH APPENDECTOMY  . VARICOSE VEIN SURGERY      FAMHx:  Family History  Problem Relation Age of Onset  . Heart disease Mother         CHF; no MI  . Breast cancer Sister        S/P bilateral mastectomy  . Diabetes Maternal Grandmother   . Thyroid disease Unknown        3 daughters  . Stroke Brother 9  . Colon cancer Neg Hx   . Esophageal cancer Neg Hx   . Stomach cancer Neg Hx   . Rectal cancer Neg Hx     SOCHx:   reports that  has never smoked. she has never used smokeless tobacco. She reports that she does not drink alcohol or use drugs.  ALLERGIES:  Allergies  Allergen Reactions  . Premarin [  Conjugated Estrogens] Other (See Comments)    TABS--  CAUSE PHLEBITIS  . Vicodin [Hydrocodone-Acetaminophen] Itching    SEVERE  . Adhesive [Tape] Other (See Comments)    IRRITATION  . Fluvastatin Sodium Other (See Comments)    HEART PALPITATIONS  . Simvastatin Other (See Comments)    SEVERE LEG ACHES    ROS: Pertinent items noted in HPI and remainder of comprehensive ROS otherwise negative.  HOME MEDS: Current Outpatient Medications on File Prior to Visit  Medication Sig Dispense Refill  . BIOTIN PO Take 1 capsule by mouth daily.    . Calcium Glycerophosphate (PRELIEF PO) Take 2 tablets by mouth daily. 2 by mouth daily with food    . Cholecalciferol (VITAMIN D3) 1000 UNITS CAPS Take 2 capsules by mouth daily.    . Glucos-Chondroit-Collag-Hyal (JOINT SUPPORT FORMULA PO) Take by mouth.    . hydrochlorothiazide (MICROZIDE) 12.5 MG capsule TAKE 1 CAPSULE (12.5 MG  TOTAL) BY MOUTH DAILY WHEN FEET SWELL 90 capsule 1  . magnesium oxide (MAG-OX) 400 MG tablet Take 800 mg by mouth daily.    . Multiple Vitamins-Minerals (ICAPS AREDS 2) CAPS Take 1 capsule by mouth 2 (two) times daily.    Vladimir Faster Glycol-Propyl Glycol (SYSTANE OP) Apply 1 drop to eye daily. To both eyes.    . polyethylene glycol powder (MIRALAX) powder Take 17 g by mouth as needed. For constipation    . senna (SENOKOT) 8.6 MG tablet Take 1 tablet by mouth daily as needed. For constipation     No current facility-administered medications on file prior to visit.     LABS/IMAGING: No results found for this or any previous visit (from the past 48 hour(s)). No results found.  LIPID PANEL:    Component Value Date/Time   CHOL 191 03/26/2016 1008   TRIG 63.0 03/26/2016 1008   HDL 72.10 03/26/2016 1008   CHOLHDL 3 03/26/2016 1008   VLDL 12.6 03/26/2016 1008   LDLCALC 106 (H) 03/26/2016 1008   LDLDIRECT 104.5 04/01/2011 1212    WEIGHTS: Wt Readings from Last 3 Encounters:  11/21/16 131 lb (59.4 kg)  10/22/16 129 lb (58.5 kg)  10/17/16 129 lb 12.8 oz (58.9 kg)    VITALS: BP 132/60   Pulse 74   Ht 5\' 6"  (1.676 m)   Wt 131 lb (59.4 kg)   BMI 21.14 kg/m   EXAM: General appearance: alert and no distress Neck: no carotid bruit, no JVD and thyroid not enlarged, symmetric, no tenderness/mass/nodules Lungs: clear to auscultation bilaterally Heart: regular rate and rhythm, S1, S2 normal, no murmur, click, rub or gallop Abdomen: soft, non-tender; bowel sounds normal; no masses,  no organomegaly Extremities: extremities normal, atraumatic, no cyanosis or edema Pulses: 2+ and symmetric Skin: Skin color, texture, turgor normal. No rashes or lesions Neurologic: Grossly normal Psych: Pleasant  EKG: Deferred  ASSESSMENT: 1. Hypertension - controlled  PLAN: 1.   Olivia Evans is well-controlled hypertension.  She has no further chest pain or worsening dyspnea.  She can follow-up  with her primary care provider and me as needed.  Pixie Casino, MD, [place]  Attending Cardiologist  Direct Dial: [phone]  Fax: [phone]  Website:  [email or web address] 11/23/2016, 2:04 PM

## 2016-12-22 ENCOUNTER — Ambulatory Visit (INDEPENDENT_AMBULATORY_CARE_PROVIDER_SITE_OTHER): Payer: PPO | Admitting: Orthopaedic Surgery

## 2016-12-22 ENCOUNTER — Encounter (INDEPENDENT_AMBULATORY_CARE_PROVIDER_SITE_OTHER): Payer: Self-pay | Admitting: Orthopaedic Surgery

## 2016-12-22 VITALS — BP 115/66 | HR 87 | Resp 12 | Ht 66.0 in | Wt 131.0 lb

## 2016-12-22 DIAGNOSIS — M1712 Unilateral primary osteoarthritis, left knee: Secondary | ICD-10-CM | POA: Diagnosis not present

## 2016-12-22 DIAGNOSIS — M1711 Unilateral primary osteoarthritis, right knee: Secondary | ICD-10-CM

## 2016-12-22 DIAGNOSIS — M170 Bilateral primary osteoarthritis of knee: Secondary | ICD-10-CM

## 2016-12-22 MED ORDER — BUPIVACAINE HCL 0.5 % IJ SOLN
2.0000 mL | INTRAMUSCULAR | Status: AC | PRN
  Administered 2016-12-22: 2 mL via INTRA_ARTICULAR

## 2016-12-22 MED ORDER — LIDOCAINE HCL 1 % IJ SOLN
2.0000 mL | INTRAMUSCULAR | Status: AC | PRN
  Administered 2016-12-22: 2 mL

## 2016-12-22 MED ORDER — METHYLPREDNISOLONE ACETATE 40 MG/ML IJ SUSP
80.0000 mg | INTRAMUSCULAR | Status: AC | PRN
  Administered 2016-12-22: 80 mg

## 2016-12-22 NOTE — Progress Notes (Signed)
Office Visit Note   Patient: Olivia Evans           Date of Birth: 1926/08/29           MRN: [id number] Visit Date: 12/22/2016              Requested by: [name], MD [place],  [id number] PCP: [name], MD   Assessment & Plan: Visit Diagnoses:  1. Bilateral primary osteoarthritis of knee     Plan: Presently little right knee is more symptomatic. Has had prior cortisone injection. Prior films demonstrating tricompartmental degenerative changes predominantly in the lateral compartment. We will reinject left knee today. Return to office in 2 weeks to consider injecting right knee . also discussed Visco supplementation  Follow-Up Instructions: Return in about 2 weeks (around 01/05/2017).   Orders:  No orders of the defined types were placed in this encounter.  No orders of the defined types were placed in this encounter.     Procedures: Large Joint Inj: R knee on 12/22/2016 11:35 AM Indications: pain and diagnostic evaluation Details: 25 G 1.5 in needle, anteromedial approach  Arthrogram: No  Medications: 2 mL lidocaine 1 %; 2 mL bupivacaine 0.5 %; 80 mg methylPREDNISolone acetate 40 MG/ML Procedure, treatment alternatives, risks and benefits explained, specific risks discussed. Consent was given by the patient. Immediately prior to procedure a time out was called to verify the correct patient, procedure, equipment, support staff and site/side marked as required. Patient was prepped and draped in the usual sterile fashion.       Clinical Data: No additional findings.   Subjective: Chief Complaint  Patient presents with  . Right Knee - Pain, Edema    Olivia Evans is an 83 y o that is here for chronic Right pain. Pt having leg cramps, one last week that woke her up. Massaged it, went away  Olivia Evans is been previously evaluated for the osteoarthritis in both knees. She presently is more symptomatic on the right and more lateral than  medial. No recent history of injury or trauma. She has had prior cortisone injection over a year ago with excellent relief of her pain.  HPI  Review of Systems  Constitutional: Negative for chills, fatigue and fever.  HENT: Positive for ear pain.   Eyes: Negative for itching.  Respiratory: Negative for chest tightness and shortness of breath.   Cardiovascular: Negative for chest pain, palpitations and leg swelling.  Gastrointestinal: Negative for blood in stool, constipation and diarrhea.  Endocrine: Negative for polyuria.  Genitourinary: Positive for frequency. Negative for dysuria.  Musculoskeletal: Negative for back pain, joint swelling, neck pain and neck stiffness.  Allergic/Immunologic: Negative for immunocompromised state.  Neurological: Positive for light-headedness. Negative for dizziness and numbness.  Hematological: Does not bruise/bleed easily.  Psychiatric/Behavioral: Positive for sleep disturbance. The patient is not nervous/anxious.      Objective: Vital Signs: BP 115/66   Pulse 87   Resp 12   Ht 5\' 6"  (1.676 m)   Wt 131 lb (59.4 kg)   BMI 21.14 kg/m   Physical Exam  Ortho Exam right knee with minimal effusion. Increased valgus with weightbearing. Predominantly lateral joint pain. Some patellar crepitation. No pain medially. Some large veins around the knee but no pain. No calf pain. No swelling distally. Neurovascular exam intact. Range of motion 0 to about 105 without instability  Specialty Comments:  No specialty comments available.  Imaging: No results found.   PMFS History:  Patient Active Problem List   Diagnosis Date Noted  . Bilateral primary osteoarthritis of knee 12/22/2016  . Essential hypertension 11/23/2016  . Shortness of breath 10/15/2016  . Palpitations 10/15/2016  . Fatigue 10/15/2016  . Osteoporosis 03/26/2016  . Diverticulosis of colon without hemorrhage 11/15/2013  . Vitamin D deficiency 06/30/2012  . INTERSTITIAL CYSTITIS  10/09/2009  . Constipation 12/13/2007  . History of colonic polyps 12/13/2007  . Hyperlipidemia 08/26/2007  . SYNCOPE 06/24/2006   Past Medical History:  Diagnosis Date  . Arthritis KNEES  . Bladder pain   . Chronic constipation   . Diverticulosis 2013   Dr Fuller Plan  . Dyslipidemia   . Heart murmur   . Hemorrhoids   . History of adenomatous polyp of colon    2003 -- VILLOUS  . History of breast cancer 2001---RIGHT BREAST CANCER   S/P PARTIAL MASTECTOMY AND RADIATION--  NO RECURRENCE  . History of palpitations   . Incomplete right bundle branch block (RBBB)   . Interstitial cystitis    Dr McDiarmid  . Lower urinary tract symptoms (LUTS)   . Normal cardiac stress test    2000  PER MD NOTE  . Osteopenia   . Wears dentures    full -upper/  partial lower  . Wears glasses     Family History  Problem Relation Age of Onset  . Heart disease Mother         CHF; no MI  . Breast cancer Sister        S/P bilateral mastectomy  . Diabetes Maternal Grandmother   . Thyroid disease Unknown        3 daughters  . Stroke Brother 10  . Colon cancer Neg Hx   . Esophageal cancer Neg Hx   . Stomach cancer Neg Hx   . Rectal cancer Neg Hx     Past Surgical History:  Procedure Laterality Date  . BENIGN TUMOR REMOVED  1960'S   SECOND RIB  . CATARACT EXTRACTION W/ INTRAOCULAR LENS  IMPLANT, BILATERAL  feb 2016  . CYSTO WITH HYDRODISTENSION  07/29/2011   Procedure: CYSTOSCOPY/HYDRODISTENSION;  Surgeon: Reece Packer, MD;  Location: [facility];  Service: Urology;  Laterality: N/A;  with fulguration of bladder ulcer  . CYSTO WITH HYDRODISTENSION N/A 08/28/2014   Procedure: CYSTO/HYDRODISTENSION, FULGURATION OF ULCERS;  Surgeon: Bjorn Loser, MD;  Location: [facility];  Service: Urology;  Laterality: N/A;  . CYSTO/  HYDRODISTENTION/  INSTILLATION THERAPY  x2  01-18- and 11-15- 2011  . CYSTO/ HOD/  BLADDER BX'S/  FULGERATION HUNNER ULCER'S /  INSTILLATION  THERAPY  10-01-2010  . PARTIAL MASTECTOMY W/ SLN DISSECTION Right 12-19-1999  . ROTATOR CUFF REPAIR    . TUBAL LIGATION    . VAGINAL HYSTERECTOMY  1970'S   WITH APPENDECTOMY  . VARICOSE VEIN SURGERY     Social History   Occupational History  . Occupation: Retired  Tobacco Use  . Smoking status: Never Smoker  . Smokeless tobacco: Never Used  Substance and Sexual Activity  . Alcohol use: No    Alcohol/week: 0.0 oz  . Drug use: No  . Sexual activity: Not on file

## 2017-01-05 ENCOUNTER — Encounter (INDEPENDENT_AMBULATORY_CARE_PROVIDER_SITE_OTHER): Payer: Self-pay | Admitting: Orthopaedic Surgery

## 2017-01-05 ENCOUNTER — Ambulatory Visit (INDEPENDENT_AMBULATORY_CARE_PROVIDER_SITE_OTHER): Payer: PPO | Admitting: Orthopaedic Surgery

## 2017-01-05 VITALS — BP 143/60 | HR 84 | Resp 14 | Ht 64.0 in | Wt 125.0 lb

## 2017-01-05 DIAGNOSIS — M170 Bilateral primary osteoarthritis of knee: Secondary | ICD-10-CM

## 2017-01-05 DIAGNOSIS — M1712 Unilateral primary osteoarthritis, left knee: Secondary | ICD-10-CM

## 2017-01-05 MED ORDER — LIDOCAINE HCL 1 % IJ SOLN
2.0000 mL | INTRAMUSCULAR | Status: AC | PRN
  Administered 2017-01-05: 2 mL

## 2017-01-05 MED ORDER — BUPIVACAINE HCL 0.5 % IJ SOLN
2.0000 mL | INTRAMUSCULAR | Status: AC | PRN
  Administered 2017-01-05: 2 mL via INTRA_ARTICULAR

## 2017-01-05 MED ORDER — METHYLPREDNISOLONE ACETATE 40 MG/ML IJ SUSP
80.0000 mg | INTRAMUSCULAR | Status: AC | PRN
  Administered 2017-01-05: 80 mg

## 2017-01-05 NOTE — Progress Notes (Signed)
Office Visit Note   Patient: Olivia Evans           Date of Birth: [birth year]           MRN: [id number] Visit Date: 01/05/2017              Requested by: [name], MD [place], [place] [id number] PCP: [name], MD   Assessment & Plan: Visit Diagnoses:  1. Bilateral primary osteoarthritis of knee     Plan: Cortisone injection left knee. I injected the right knee several weeks ago and it "made a difference". We'll plan to see back on a when necessary basis. Again have discussed Visco supplementation  Follow-Up Instructions: Return if symptoms worsen or fail to improve.   Orders:  Orders Placed This Encounter  Procedures  . Large Joint Inj: L knee   No orders of the defined types were placed in this encounter.     Procedures: Large Joint Inj: L knee on 01/05/2017 10:05 AM Indications: pain and diagnostic evaluation Details: 25 G 1.5 in needle, anteromedial approach  Arthrogram: No  Medications: 2 mL lidocaine 1 %; 2 mL bupivacaine 0.5 %; 80 mg methylPREDNISolone acetate 40 MG/ML Procedure, treatment alternatives, risks and benefits explained, specific risks discussed. Consent was given by the patient. Patient was prepped and draped in the usual sterile fashion.       Clinical Data: No additional findings.   Subjective: Chief Complaint  Patient presents with  . Left Knee - Pain    Olivia Evans is an 83 y o here today for bilateral knee pain. 12/3 received injection in Right knee. Wants Left knee injection today. No concern with the cortisone.  Olivia Evans has been diagnosed with bilateral knee osteoarthritis. She's done well in the past with cortisone injections. She was seen several weeks ago I injected the right knee. "Made a difference". She now like to proceed with cortisone injection of her left knee.  HPI  Review of Systems  Constitutional: Negative for chills, fatigue and fever.  Eyes: Negative for itching.  Respiratory: Negative  for chest tightness and shortness of breath.   Cardiovascular: Negative for chest pain, palpitations and leg swelling.  Gastrointestinal: Negative for blood in stool, constipation and diarrhea.  Endocrine: Negative for polyuria.  Genitourinary: Positive for frequency. Negative for dysuria.  Musculoskeletal: Negative for back pain, joint swelling, neck pain and neck stiffness.  Allergic/Immunologic: Negative for immunocompromised state.  Neurological: Negative for dizziness and numbness.  Hematological: Does not bruise/bleed easily.  Psychiatric/Behavioral: The patient is not nervous/anxious.      Objective: Vital Signs: BP (!) 143/60   Pulse 84   Resp 14   Ht 5\' 4"  (1.626 m)   Wt 125 lb (56.7 kg)   BMI 21.46 kg/m   Physical Exam  Ortho Exam awake alert and oriented 3. Comfortable sitting. Full range of motion left knee with some mild lateral joint pain. Some patellar crepitation. Minimal effusion. Knee was not hot red warm swollen. No instability. No popliteal mass. Neurovascular exam intact  Specialty Comments:  No specialty comments available.  Imaging: No results found.   PMFS History: Patient Active Problem List   Diagnosis Date Noted  . Bilateral primary osteoarthritis of knee 12/22/2016  . Essential hypertension 11/23/2016  . Shortness of breath 10/15/2016  . Palpitations 10/15/2016  . Fatigue 10/15/2016  . Osteoporosis 03/26/2016  . Diverticulosis of colon without hemorrhage 11/15/2013  . Vitamin D deficiency 06/30/2012  . INTERSTITIAL  CYSTITIS 10/09/2009  . Constipation 12/13/2007  . History of colonic polyps 12/13/2007  . Hyperlipidemia 08/26/2007  . SYNCOPE 06/24/2006   Past Medical History:  Diagnosis Date  . Arthritis KNEES  . Bladder pain   . Chronic constipation   . Diverticulosis 2013   Dr Fuller Plan  . Dyslipidemia   . Heart murmur   . Hemorrhoids   . History of adenomatous polyp of colon    2003 -- VILLOUS  . History of breast cancer  2001---RIGHT BREAST CANCER   S/P PARTIAL MASTECTOMY AND RADIATION--  NO RECURRENCE  . History of palpitations   . Incomplete right bundle branch block (RBBB)   . Interstitial cystitis    Dr McDiarmid  . Lower urinary tract symptoms (LUTS)   . Normal cardiac stress test    2000  PER MD NOTE  . Osteopenia   . Wears dentures    full -upper/  partial lower  . Wears glasses     Family History  Problem Relation Age of Onset  . Heart disease Mother         CHF; no MI  . Breast cancer Sister        S/P bilateral mastectomy  . Diabetes Maternal Grandmother   . Thyroid disease Unknown        3 daughters  . Stroke Brother 41  . Colon cancer Neg Hx   . Esophageal cancer Neg Hx   . Stomach cancer Neg Hx   . Rectal cancer Neg Hx     Past Surgical History:  Procedure Laterality Date  . BENIGN TUMOR REMOVED  1960'S   SECOND RIB  . CATARACT EXTRACTION W/ INTRAOCULAR LENS  IMPLANT, BILATERAL  feb 2016  . CYSTO WITH HYDRODISTENSION  07/29/2011   Procedure: CYSTOSCOPY/HYDRODISTENSION;  Surgeon: Reece Packer, MD;  Location: [facility];  Service: Urology;  Laterality: N/A;  with fulguration of bladder ulcer  . CYSTO WITH HYDRODISTENSION N/A 08/28/2014   Procedure: CYSTO/HYDRODISTENSION, FULGURATION OF ULCERS;  Surgeon: Bjorn Loser, MD;  Location: [facility];  Service: Urology;  Laterality: N/A;  . CYSTO/  HYDRODISTENTION/  INSTILLATION THERAPY  x2  01-18- and 11-15- 2011  . CYSTO/ HOD/  BLADDER BX'S/  FULGERATION HUNNER ULCER'S /  INSTILLATION THERAPY  10-01-2010  . PARTIAL MASTECTOMY W/ SLN DISSECTION Right 12-19-1999  . ROTATOR CUFF REPAIR    . TUBAL LIGATION    . VAGINAL HYSTERECTOMY  1970'S   WITH APPENDECTOMY  . VARICOSE VEIN SURGERY     Social History   Occupational History  . Occupation: Retired  Tobacco Use  . Smoking status: Never Smoker  . Smokeless tobacco: Never Used  Substance and Sexual Activity  . Alcohol use: No    Alcohol/week:  0.0 oz  . Drug use: No  . Sexual activity: Not on file

## 2017-02-23 ENCOUNTER — Encounter: Payer: PPO | Admitting: Psychology

## 2017-03-02 ENCOUNTER — Encounter: Payer: PPO | Admitting: Psychology

## 2017-03-03 DIAGNOSIS — H353110 Nonexudative age-related macular degeneration, right eye, stage unspecified: Secondary | ICD-10-CM | POA: Diagnosis not present

## 2017-08-23 ENCOUNTER — Ambulatory Visit (HOSPITAL_COMMUNITY)
Admission: EM | Admit: 2017-08-23 | Discharge: 2017-08-23 | Disposition: A | Discharge status: Home / Self Care | Payer: PPO | Attending: Family Medicine | Admitting: Family Medicine

## 2017-08-23 ENCOUNTER — Other Ambulatory Visit: Payer: Self-pay

## 2017-08-23 ENCOUNTER — Encounter (HOSPITAL_COMMUNITY): Payer: Self-pay | Admitting: Emergency Medicine

## 2017-08-23 DIAGNOSIS — L247 Irritant contact dermatitis due to plants, except food: Secondary | ICD-10-CM

## 2017-08-23 DIAGNOSIS — R21 Rash and other nonspecific skin eruption: Secondary | ICD-10-CM | POA: Diagnosis not present

## 2017-08-23 MED ORDER — HYDROXYZINE HCL 10 MG PO TABS: 10.0000 mg | ORAL_TABLET | Freq: Three times a day (TID) | ORAL | 0 refills | Status: DC | PRN

## 2017-08-23 MED ORDER — METHYLPREDNISOLONE ACETATE 40 MG/ML IJ SUSP
40.0000 mg | Freq: Once | INTRAMUSCULAR | Status: AC
  Administered 2017-08-23: 40 mg via INTRAMUSCULAR

## 2017-08-23 MED ORDER — METHYLPREDNISOLONE ACETATE 40 MG/ML IJ SUSP
INTRAMUSCULAR | Status: AC
  Filled 2017-08-23: qty 1

## 2017-08-23 NOTE — Discharge Instructions (Signed)
May continue the cortisone cream You have been given a shot of a long-acting cortisone/prednisone Take hydroxyzine as needed for itching.  This is useful at night.  It may cause drowsiness during the day. Return if your rash fails to improve, or gets worse

## 2017-08-23 NOTE — ED Triage Notes (Signed)
The patient presented to the [facility] with a complaint of a rash x 4 days that she believed to be from poison oak or ivy.

## 2017-08-23 NOTE — ED Provider Notes (Signed)
[place]    CSN: [id number] Arrival date & time: 08/23/17  1350     History   Chief Complaint Chief Complaint  Patient presents with  . Rash    HPI Olivia Evans is a 84 y.o. female.   HPI  Healthy 84 year old woman.  Here with her daughter.  She takes Microzide for hypertension.  Otherwise no prescription medications.  Multiple dietary supplements and vitamins.  PRN's for chronic constipation.  She lives alone and is independent.  She was working in her yard and got poison ivy on her arms and abdomen.  It is spreading.  She states that the itching is driving her crazy.  She is been using cortisone cream.  It has been going on for several days.  Nothing on her face, no shortness of breath.  She is used "3 different creams" including cortisone and Benadryl-containing creams for the itching.  She is here specifically requesting a shot.  She states this is helped her with poison ivy in the past  Past Medical History:  Diagnosis Date  . Arthritis KNEES  . Bladder pain   . Chronic constipation   . Diverticulosis 2013   Dr Fuller Plan  . Dyslipidemia   . Heart murmur   . Hemorrhoids   . History of adenomatous polyp of colon    2003 -- VILLOUS  . History of breast cancer 2001---RIGHT BREAST CANCER   S/P PARTIAL MASTECTOMY AND RADIATION--  NO RECURRENCE  . History of palpitations   . Incomplete right bundle branch block (RBBB)   . Interstitial cystitis    Dr McDiarmid  . Lower urinary tract symptoms (LUTS)   . Normal cardiac stress test    2000  PER MD NOTE  . Osteopenia   . Wears dentures    full -upper/  partial lower  . Wears glasses     Patient Active Problem List   Diagnosis Date Noted  . Bilateral primary osteoarthritis of knee 12/22/2016  . Essential hypertension 11/23/2016  . Shortness of breath 10/15/2016  . Palpitations 10/15/2016  . Fatigue 10/15/2016  . Osteoporosis 03/26/2016  . Diverticulosis of colon without hemorrhage 11/15/2013  . Vitamin D  deficiency 06/30/2012  . INTERSTITIAL CYSTITIS 10/09/2009  . Constipation 12/13/2007  . History of colonic polyps 12/13/2007  . Hyperlipidemia 08/26/2007  . SYNCOPE 06/24/2006    Past Surgical History:  Procedure Laterality Date  . BENIGN TUMOR REMOVED  1960'S   SECOND RIB  . CATARACT EXTRACTION W/ INTRAOCULAR LENS  IMPLANT, BILATERAL  feb 2016  . CYSTO WITH HYDRODISTENSION  07/29/2011   Procedure: CYSTOSCOPY/HYDRODISTENSION;  Surgeon: Reece Packer, MD;  Location: [facility];  Service: Urology;  Laterality: N/A;  with fulguration of bladder ulcer  . CYSTO WITH HYDRODISTENSION N/A 08/28/2014   Procedure: CYSTO/HYDRODISTENSION, FULGURATION OF ULCERS;  Surgeon: Bjorn Loser, MD;  Location: [facility];  Service: Urology;  Laterality: N/A;  . CYSTO/  HYDRODISTENTION/  INSTILLATION THERAPY  x2  01-18- and 11-15- 2011  . CYSTO/ HOD/  BLADDER BX'S/  FULGERATION HUNNER ULCER'S /  INSTILLATION THERAPY  10-01-2010  . PARTIAL MASTECTOMY W/ SLN DISSECTION Right 12-19-1999  . ROTATOR CUFF REPAIR    . TUBAL LIGATION    . VAGINAL HYSTERECTOMY  1970'S   WITH APPENDECTOMY  . VARICOSE VEIN SURGERY      OB History   None      Home Medications    Prior to Admission medications   Medication Sig Start Date  End Date Taking? Authorizing Provider  BIOTIN PO Take 1 capsule by mouth daily.    [provider]  Calcium Glycerophosphate (PRELIEF PO) Take 2 tablets by mouth daily. 2 by mouth daily with food    [provider]  Cholecalciferol (VITAMIN D3) 1000 UNITS CAPS Take 2 capsules by mouth daily.    [provider]  Glucos-Chondroit-Collag-Hyal (JOINT SUPPORT FORMULA PO) Take by mouth.    [provider]  hydrochlorothiazide (MICROZIDE) 12.5 MG capsule TAKE 1 CAPSULE (12.5 MG TOTAL) BY MOUTH DAILY WHEN FEET SWELL 11/17/14   Hendricks Limes, MD  hydrOXYzine (ATARAX/VISTARIL) 10 MG tablet Take 1 tablet (10 mg total) by mouth 3  (three) times daily as needed for itching. 08/23/17   Raylene Everts, MD  magnesium oxide (MAG-OX) 400 MG tablet Take 800 mg by mouth daily.    [provider]  Multiple Vitamins-Minerals (ICAPS AREDS 2) CAPS Take 1 capsule by mouth 2 (two) times daily.    [provider]  oxyCODONE-acetaminophen (PERCOCET/ROXICET) 5-325 MG tablet Take 1 tablet by mouth as needed for severe pain.    [provider]  Polyethyl Glycol-Propyl Glycol (SYSTANE OP) Apply 1 drop to eye daily. To both eyes.    [provider]  polyethylene glycol powder (MIRALAX) powder Take 17 g by mouth as needed. For constipation    [provider]  senna (SENOKOT) 8.6 MG tablet Take 1 tablet by mouth daily as needed. For constipation    [provider]    Family History Family History  Problem Relation Age of Onset  . Heart disease Mother         CHF; no MI  . Breast cancer Sister        S/P bilateral mastectomy  . Diabetes Maternal Grandmother   . Thyroid disease Unknown        3 daughters  . Stroke Brother 14  . Colon cancer Neg Hx   . Esophageal cancer Neg Hx   . Stomach cancer Neg Hx   . Rectal cancer Neg Hx     Social History Social History   Tobacco Use  . Smoking status: Never Smoker  . Smokeless tobacco: Never Used  Substance Use Topics  . Alcohol use: No    Alcohol/week: 0.0 oz  . Drug use: No     Allergies   Premarin [conjugated estrogens]; Vicodin [hydrocodone-acetaminophen]; Adhesive [tape]; Fluvastatin sodium; and Simvastatin   Review of Systems Review of Systems  Constitutional: Negative for chills and fever.  HENT: Negative for ear pain and sore throat.   Eyes: Negative for pain and visual disturbance.  Respiratory: Negative for cough and shortness of breath.   Cardiovascular: Negative for chest pain and palpitations.  Gastrointestinal: Negative for abdominal pain and vomiting.  Genitourinary: Negative for dysuria and hematuria.    Musculoskeletal: Negative for arthralgias and back pain.  Skin: Positive for rash. Negative for color change.  Neurological: Negative for seizures and syncope.  All other systems reviewed and are negative.    Physical Exam Triage Vital Signs ED Triage Vitals  Enc Vitals Group     BP 08/23/17 1426 125/62     Pulse Rate 08/23/17 1426 85     Resp 08/23/17 1426 20     Temp 08/23/17 1426 99.1 F (37.3 C)     Temp Source 08/23/17 1426 Oral     SpO2 08/23/17 1426 99 %     Weight --      Height --  Head Circumference --      Peak Flow --      Pain Score 08/23/17 1425 0     Pain Loc --      Pain Edu? --      Excl. in [place]? --    No data found.  Updated Vital Signs BP 125/62 (BP Location: Left Arm)   Pulse 85   Temp 99.1 F (37.3 C) (Oral)   Resp 20   SpO2 99%   Visual Acuity Right Eye Distance:   Left Eye Distance:   Bilateral Distance:    Right Eye Near:   Left Eye Near:    Bilateral Near:     Physical Exam  Constitutional: She appears well-developed and well-nourished. No distress.  HENT:  Head: Normocephalic and atraumatic.  Mouth/Throat: Oropharynx is clear and moist.  Eyes: Pupils are equal, round, and reactive to light. Conjunctivae are normal.  Neck: Normal range of motion.  Cardiovascular: Normal rate and regular rhythm.  Murmur heard. Soft systolic murmur  Pulmonary/Chest: Effort normal and breath sounds normal. No respiratory distress.  Abdominal: Soft. She exhibits no distension.  Musculoskeletal: Normal range of motion. She exhibits no edema.  Neurological: She is alert.  Skin: Skin is warm and dry.  Patches of vesicular rash on both wrists and forearms, scattered also on right lower abdomen.  Psychiatric: She has a normal mood and affect. Her behavior is normal.     UC Treatments / Results  Labs (all labs ordered are listed, but only abnormal results are displayed) Labs Reviewed - No data to display  EKG None  Radiology No results  found.  Procedures Procedures (including critical care time)  Medications Ordered in UC Medications  methylPREDNISolone acetate (DEPO-MEDROL) injection 40 mg (40 mg Intramuscular Given 08/23/17 1541)    Initial Impression / Assessment and Plan / UC Course  I have reviewed the triage vital signs and the nursing notes.  Pertinent labs & imaging results that were available during my care of the patient were reviewed by me and considered in my medical decision making (see chart for details).    Patient is given a shot of Depo-Medrol to last for several days.  She is given Atarax 10 mg for the itching with caution that will cause drowsiness.  Her daughter is going to stay with her tonight to make sure that she does not have any reaction or falls. Final Clinical Impressions(s) / UC Diagnoses   Final diagnoses:  Irritant contact dermatitis due to plants, except food     Discharge Instructions     May continue the cortisone cream You have been given a shot of a long-acting cortisone/prednisone Take hydroxyzine as needed for itching.  This is useful at night.  It may cause drowsiness during the day. Return if your rash fails to improve, or gets worse   ED Prescriptions    Medication Sig Dispense Auth. Provider   hydrOXYzine (ATARAX/VISTARIL) 10 MG tablet Take 1 tablet (10 mg total) by mouth 3 (three) times daily as needed for itching. 30 tablet Raylene Everts, MD     Controlled Substance Prescriptions [place] Controlled Substance Registry consulted? Not Applicable   Raylene Everts, MD 08/23/17 2005

## 2017-09-12 DIAGNOSIS — R600 Localized edema: Secondary | ICD-10-CM | POA: Insufficient documentation

## 2017-09-12 NOTE — Progress Notes (Signed)
Subjective:    Patient ID: Olivia Evans, female    DOB: [birth year], 84 y.o.   MRN: [id number]  HPI She is here for a physical exam.   She lives alone and still drives.  She does not cook much.    She gets up in the morning and starts doing things and will feel weak at times.  If she stops and drinks a boost and rests and feels better.  She sometimes get this later in the day.     Medications and allergies reviewed with patient and updated if appropriate.  Patient Active Problem List   Diagnosis Date Noted  . Bilateral leg edema 09/12/2017  . Bilateral primary osteoarthritis of knee 12/22/2016  . Essential hypertension 11/23/2016  . Osteoporosis 03/26/2016  . Diverticulosis of colon without hemorrhage 11/15/2013  . Vitamin D deficiency 06/30/2012  . INTERSTITIAL CYSTITIS 10/09/2009  . Constipation 12/13/2007  . History of colonic polyps 12/13/2007  . Hyperlipidemia 08/26/2007  . SYNCOPE 06/24/2006    Current Outpatient Medications on File Prior to Visit  Medication Sig Dispense Refill  . BIOTIN PO Take 1 capsule by mouth daily.    . Cholecalciferol (VITAMIN D3) 1000 UNITS CAPS Take 2 capsules by mouth daily.    . Glucos-Chondroit-Collag-Hyal (JOINT SUPPORT FORMULA PO) Take by mouth.    . hydrochlorothiazide (MICROZIDE) 12.5 MG capsule TAKE 1 CAPSULE (12.5 MG TOTAL) BY MOUTH DAILY WHEN FEET SWELL 90 capsule 1  . magnesium oxide (MAG-OX) 400 MG tablet Take 800 mg by mouth daily.    . Multiple Vitamins-Minerals (ICAPS AREDS 2) CAPS Take 1 capsule by mouth 2 (two) times daily.    Marland Kitchen oxyCODONE-acetaminophen (PERCOCET/ROXICET) 5-325 MG tablet Take 1 tablet by mouth as needed for severe pain.    Vladimir Faster Glycol-Propyl Glycol (SYSTANE OP) Apply 1 drop to eye daily. To both eyes.     No current facility-administered medications on file prior to visit.     Past Medical History:  Diagnosis Date  . Arthritis KNEES  . Bladder pain   . Chronic constipation   . Diverticulosis  2013   Dr Fuller Plan  . Dyslipidemia   . Heart murmur   . Hemorrhoids   . History of adenomatous polyp of colon    2003 -- VILLOUS  . History of breast cancer 2001---RIGHT BREAST CANCER   S/P PARTIAL MASTECTOMY AND RADIATION--  NO RECURRENCE  . History of palpitations   . Incomplete right bundle branch block (RBBB)   . Interstitial cystitis    Dr McDiarmid  . Lower urinary tract symptoms (LUTS)   . Normal cardiac stress test    2000  PER MD NOTE  . Osteopenia   . Wears dentures    full -upper/  partial lower  . Wears glasses     Past Surgical History:  Procedure Laterality Date  . BENIGN TUMOR REMOVED  1960'S   SECOND RIB  . CATARACT EXTRACTION W/ INTRAOCULAR LENS  IMPLANT, BILATERAL  feb 2016  . CYSTO WITH HYDRODISTENSION  07/29/2011   Procedure: CYSTOSCOPY/HYDRODISTENSION;  Surgeon: Reece Packer, MD;  Location: [facility];  Service: Urology;  Laterality: N/A;  with fulguration of bladder ulcer  . CYSTO WITH HYDRODISTENSION N/A 08/28/2014   Procedure: CYSTO/HYDRODISTENSION, FULGURATION OF ULCERS;  Surgeon: Bjorn Loser, MD;  Location: [facility];  Service: Urology;  Laterality: N/A;  . CYSTO/  HYDRODISTENTION/  INSTILLATION THERAPY  x2  01-18- and 11-15- 2011  . CYSTO/ HOD/  BLADDER  BX'S/  FULGERATION HUNNER ULCER'S /  INSTILLATION THERAPY  10-01-2010  . PARTIAL MASTECTOMY W/ SLN DISSECTION Right 12-19-1999  . ROTATOR CUFF REPAIR    . TUBAL LIGATION    . VAGINAL HYSTERECTOMY  1970'S   WITH APPENDECTOMY  . VARICOSE VEIN SURGERY      Social History   Socioeconomic History  . Marital status: Widowed    Spouse name: Not on file  . Number of children: 4  . Years of education: Not on file  . Highest education level: Not on file  Occupational History  . Occupation: Retired  Scientific laboratory technician  . Financial resource strain: Not on file  . Food insecurity:    Worry: Not on file    Inability: Not on file  . Transportation needs:    Medical:  Not on file    Non-medical: Not on file  Tobacco Use  . Smoking status: Never Smoker  . Smokeless tobacco: Never Used  Substance and Sexual Activity  . Alcohol use: No    Alcohol/week: 0.0 standard drinks  . Drug use: No  . Sexual activity: Not on file  Lifestyle  . Physical activity:    Days per week: Not on file    Minutes per session: Not on file  . Stress: Not on file  Relationships  . Social connections:    Talks on phone: Not on file    Gets together: Not on file    Attends religious service: Not on file    Active member of club or organization: Not on file    Attends meetings of clubs or organizations: Not on file    Relationship status: Not on file  Other Topics Concern  . Not on file  Social History Narrative  . Not on file    Family History  Problem Relation Age of Onset  . Heart disease Mother         CHF; no MI  . Breast cancer Sister        S/P bilateral mastectomy  . Diabetes Maternal Grandmother   . Thyroid disease Unknown        3 daughters  . Stroke Brother 81  . Colon cancer Neg Hx   . Esophageal cancer Neg Hx   . Stomach cancer Neg Hx   . Rectal cancer Neg Hx     Review of Systems  Constitutional: Negative for chills and fever.  Eyes: Negative for visual disturbance.  Respiratory: Positive for cough (early in the morning). Negative for shortness of breath and wheezing.   Cardiovascular: Positive for leg swelling (intermittent). Negative for chest pain and palpitations.  Gastrointestinal: Positive for constipation (controlled - colace). Negative for abdominal pain, blood in stool, diarrhea and nausea.       No gerd  Genitourinary: Negative for dysuria and hematuria.  Musculoskeletal: Positive for arthralgias (knees).  Skin: Negative for color change and rash.  Neurological: Negative for light-headedness and headaches.  Hematological: Bruises/bleeds easily.  Psychiatric/Behavioral: Negative for dysphoric mood. The patient is not  nervous/anxious.        Objective:   Vitals:   09/14/17 0857  BP: (!) 142/78  Pulse: 70  Resp: 16  Temp: 97.6 F (36.4 C)  SpO2: 98%   Filed Weights   09/14/17 0857  Weight: 132 lb (59.9 kg)   Body mass index is 22.66 kg/m.  BP Readings from Last 3 Encounters:  09/14/17 (!) 142/78  08/23/17 125/62  01/05/17 (!) 143/60    Wt Readings from Last  3 Encounters:  09/14/17 132 lb (59.9 kg)  01/05/17 125 lb (56.7 kg)  12/22/16 131 lb (59.4 kg)     Physical Exam Constitutional: She appears well-developed and well-nourished. No distress.  HENT:  Head: Normocephalic and atraumatic.  Right Ear: External ear normal. Normal ear canal and TM Left Ear: External ear normal.  Normal ear canal and TM Mouth/Throat: Oropharynx is clear and moist.  Eyes: Conjunctivae and EOM are normal.  Neck: Neck supple. No tracheal deviation present. No thyromegaly present.  No carotid bruit  Cardiovascular: Normal rate, regular rhythm and normal heart sounds.   No murmur heard.  No edema. Pulmonary/Chest: Effort normal and breath sounds normal. No respiratory distress. She has no wheezes. She has no rales.  Breast: deferred to gyn Abdominal: Soft. She exhibits no distension. There is no tenderness.  Lymphadenopathy: She has no cervical adenopathy.  Skin: Skin is warm and dry. She is not diaphoretic.  Psychiatric: She has a normal mood and affect. Her behavior is normal.        Assessment & Plan:   Physical exam: Screening blood work  ordered Immunizations  Discussed shingles, flu, td; prevnar today Colonoscopy  Aged out Mammogram    Aged out Gyn   Dr Nori Riis Dexa   Last done 2013 -  Due  - will discuss with Dr Nori Riis Eye exams Up to date  EKG   Done 09/2016 Exercise  No regular exercise - does own ADLs Weight  Normal BMI Skin    No concerns Substance abuse   none  See Problem List for Assessment and Plan of chronic medical problems.   FU annually

## 2017-09-12 NOTE — Assessment & Plan Note (Addendum)
She takes hctz prn for leg swelling - she does not take if often Her leg swelling is controlled

## 2017-09-12 NOTE — Patient Instructions (Addendum)
Test(s) ordered today. Your results will be released to [facility] (or called to you) after review, usually within 72hours after test completion. If any changes need to be made, you will be notified at that same time.  All other Health Maintenance issues reviewed.   All recommended immunizations and age-appropriate screenings are up-to-date or discussed.  Prevnar pneumonia immunization administered today.   Medications reviewed and updated.  No changes recommended at this time.   Please followup in one year   Health Maintenance, Female Adopting a healthy lifestyle and getting preventive care can go a long way to promote health and wellness. Talk with your health care provider about what schedule of regular examinations is right for you. This is a good chance for you to check in with your provider about disease prevention and staying healthy. In between checkups, there are plenty of things you can do on your own. Experts have done a lot of research about which lifestyle changes and preventive measures are most likely to keep you healthy. Ask your health care provider for more information. Weight and diet Eat a healthy diet  Be sure to include plenty of vegetables, fruits, low-fat dairy products, and lean protein.  Do not eat a lot of foods high in solid fats, added sugars, or salt.  Get regular exercise. This is one of the most important things you can do for your health. ? Most adults should exercise for at least 150 minutes each week. The exercise should increase your heart rate and make you sweat (moderate-intensity exercise). ? Most adults should also do strengthening exercises at least twice a week. This is in addition to the moderate-intensity exercise.  Maintain a healthy weight  Body mass index (BMI) is a measurement that can be used to identify possible weight problems. It estimates body fat based on height and weight. Your health care provider can help determine your BMI and help  you achieve or maintain a healthy weight.  For females 44 years of age and older: ? A BMI below 18.5 is considered underweight. ? A BMI of 18.5 to 24.9 is normal. ? A BMI of 25 to 29.9 is considered overweight. ? A BMI of 30 and above is considered obese.  Watch levels of cholesterol and blood lipids  You should start having your blood tested for lipids and cholesterol at 82 years of age, then have this test every 5 years.  You may need to have your cholesterol levels checked more often if: ? Your lipid or cholesterol levels are high. ? You are older than 82 years of age. ? You are at high risk for heart disease.  Cancer screening Lung Cancer  Lung cancer screening is recommended for adults 34-39 years old who are at high risk for lung cancer because of a history of smoking.  A yearly low-dose CT scan of the lungs is recommended for people who: ? Currently smoke. ? Have quit within the past 15 years. ? Have at least a 30-pack-year history of smoking. A pack year is smoking an average of one pack of cigarettes a day for 1 year.  Yearly screening should continue until it has been 15 years since you quit.  Yearly screening should stop if you develop a health problem that would prevent you from having lung cancer treatment.  Breast Cancer  Practice breast self-awareness. This means understanding how your breasts normally appear and feel.  It also means doing regular breast self-exams. Let your health care provider know about any  changes, no matter how small.  If you are in your 20s or 30s, you should have a clinical breast exam (CBE) by a health care provider every 1-3 years as part of a regular health exam.  If you are 42 or older, have a CBE every year. Also consider having a breast X-ray (mammogram) every year.  If you have a family history of breast cancer, talk to your health care provider about genetic screening.  If you are at high risk for breast cancer, talk to your  health care provider about having an MRI and a mammogram every year.  Breast cancer gene (BRCA) assessment is recommended for women who have family members with BRCA-related cancers. BRCA-related cancers include: ? Breast. ? Ovarian. ? Tubal. ? Peritoneal cancers.  Results of the assessment will determine the need for genetic counseling and BRCA1 and BRCA2 testing.  Cervical Cancer Your health care provider may recommend that you be screened regularly for cancer of the pelvic organs (ovaries, uterus, and vagina). This screening involves a pelvic examination, including checking for microscopic changes to the surface of your cervix (Pap test). You may be encouraged to have this screening done every 3 years, beginning at age 33.  For women ages 63-65, health care providers may recommend pelvic exams and Pap testing every 3 years, or they may recommend the Pap and pelvic exam, combined with testing for human papilloma virus (HPV), every 5 years. Some types of HPV increase your risk of cervical cancer. Testing for HPV may also be done on women of any age with unclear Pap test results.  Other health care providers may not recommend any screening for nonpregnant women who are considered low risk for pelvic cancer and who do not have symptoms. Ask your health care provider if a screening pelvic exam is right for you.  If you have had past treatment for cervical cancer or a condition that could lead to cancer, you need Pap tests and screening for cancer for at least 20 years after your treatment. If Pap tests have been discontinued, your risk factors (such as having a new sexual partner) need to be reassessed to determine if screening should resume. Some women have medical problems that increase the chance of getting cervical cancer. In these cases, your health care provider may recommend more frequent screening and Pap tests.  Colorectal Cancer  This type of cancer can be detected and often  prevented.  Routine colorectal cancer screening usually begins at 82 years of age and continues through 82 years of age.  Your health care provider may recommend screening at an earlier age if you have risk factors for colon cancer.  Your health care provider may also recommend using home test kits to check for hidden blood in the stool.  A small camera at the end of a tube can be used to examine your colon directly (sigmoidoscopy or colonoscopy). This is done to check for the earliest forms of colorectal cancer.  Routine screening usually begins at age 42.  Direct examination of the colon should be repeated every 5-10 years through 82 years of age. However, you may need to be screened more often if early forms of precancerous polyps or small growths are found.  Skin Cancer  Check your skin from head to toe regularly.  Tell your health care provider about any new moles or changes in moles, especially if there is a change in a mole's shape or color.  Also tell your health care provider if  have a mole that is larger than the size of a pencil eraser.  Always use sunscreen. Apply sunscreen liberally and repeatedly throughout the day.  Protect yourself by wearing long sleeves, pants, a wide-brimmed hat, and sunglasses whenever you are outside.  Heart disease, diabetes, and high blood pressure  High blood pressure causes heart disease and increases the risk of stroke. High blood pressure is more likely to develop in: ? People who have blood pressure in the high end of the normal range (130-139/85-89 mm Hg). ? People who are overweight or obese. ? People who are African American.  If you are 18-39 years of age, have your blood pressure checked every 3-5 years. If you are 40 years of age or older, have your blood pressure checked every year. You should have your blood pressure measured twice-once when you are at a hospital or clinic, and once when you are not at a hospital or clinic.  Record the average of the two measurements. To check your blood pressure when you are not at a hospital or clinic, you can use: ? An automated blood pressure machine at a pharmacy. ? A home blood pressure monitor.  If you are between 55 years and 79 years old, ask your health care provider if you should take aspirin to prevent strokes.  Have regular diabetes screenings. This involves taking a blood sample to check your fasting blood sugar level. ? If you are at a normal weight and have a low risk for diabetes, have this test once every three years after 82 years of age. ? If you are overweight and have a high risk for diabetes, consider being tested at a younger age or more often. Preventing infection Hepatitis B  If you have a higher risk for hepatitis B, you should be screened for this virus. You are considered at high risk for hepatitis B if: ? You were born in a country where hepatitis B is common. Ask your health care provider which countries are considered high risk. ? Your parents were born in a high-risk country, and you have not been immunized against hepatitis B (hepatitis B vaccine). ? You have HIV or AIDS. ? You use needles to inject street drugs. ? You live with someone who has hepatitis B. ? You have had sex with someone who has hepatitis B. ? You get hemodialysis treatment. ? You take certain medicines for conditions, including cancer, organ transplantation, and autoimmune conditions.  Hepatitis C  Blood testing is recommended for: ? Everyone born from 1945 through 1965. ? Anyone with known risk factors for hepatitis C.  Sexually transmitted infections (STIs)  You should be screened for sexually transmitted infections (STIs) including gonorrhea and chlamydia if: ? You are sexually active and are younger than 82 years of age. ? You are older than 82 years of age and your health care provider tells you that you are at risk for this type of infection. ? Your sexual  activity has changed since you were last screened and you are at an increased risk for chlamydia or gonorrhea. Ask your health care provider if you are at risk.  If you do not have HIV, but are at risk, it may be recommended that you take a prescription medicine daily to prevent HIV infection. This is called pre-exposure prophylaxis (PrEP). You are considered at risk if: ? You are sexually active and do not regularly use condoms or know the HIV status of your partner(s). ? You take drugs by injection. ?   injection. ? You are sexually active with a partner who has HIV.  Talk with your health care provider about whether you are at high risk of being infected with HIV. If you choose to begin PrEP, you should first be tested for HIV. You should then be tested every 3 months for as long as you are taking PrEP. Pregnancy  If you are premenopausal and you may become pregnant, ask your health care provider about preconception counseling.  If you may become pregnant, take 400 to 800 micrograms (mcg) of folic acid every day.  If you want to prevent pregnancy, talk to your health care provider about birth control (contraception). Osteoporosis and menopause  Osteoporosis is a disease in which the bones lose minerals and strength with aging. This can result in serious bone fractures. Your risk for osteoporosis can be identified using a bone density scan.  If you are 94 years of age or older, or if you are at risk for osteoporosis and fractures, ask your health care provider if you should be screened.  Ask your health care provider whether you should take a calcium or vitamin D supplement to lower your risk for osteoporosis.  Menopause may have certain physical symptoms and risks.  Hormone replacement therapy may reduce some of these symptoms and risks. Talk to your health care provider about whether hormone replacement therapy is right for you. Follow these instructions at home:  Schedule regular health, dental,  and eye exams.  Stay current with your immunizations.  Do not use any tobacco products including cigarettes, chewing tobacco, or electronic cigarettes.  If you are pregnant, do not drink alcohol.  If you are breastfeeding, limit how much and how often you drink alcohol.  Limit alcohol intake to no more than 1 drink per day for nonpregnant women. One drink equals 12 ounces of beer, 5 ounces of wine, or 1 ounces of hard liquor.  Do not use street drugs.  Do not share needles.  Ask your health care provider for help if you need support or information about quitting drugs.  Tell your health care provider if you often feel depressed.  Tell your health care provider if you have ever been abused or do not feel safe at home. This information is not intended to replace advice given to you by your health care provider. Make sure you discuss any questions you have with your health care provider. Document Released: 07/22/2010 Document Revised: 06/14/2015 Document Reviewed: 10/10/2014 Elsevier Interactive Patient Education  [employer].

## 2017-09-14 ENCOUNTER — Other Ambulatory Visit (INDEPENDENT_AMBULATORY_CARE_PROVIDER_SITE_OTHER): Payer: PPO

## 2017-09-14 ENCOUNTER — Encounter: Payer: Self-pay | Admitting: Internal Medicine

## 2017-09-14 ENCOUNTER — Ambulatory Visit (INDEPENDENT_AMBULATORY_CARE_PROVIDER_SITE_OTHER): Payer: PPO | Admitting: Internal Medicine

## 2017-09-14 VITALS — BP 142/78 | HR 70 | Temp 97.6°F | Resp 16 | Ht 64.0 in | Wt 132.0 lb

## 2017-09-14 DIAGNOSIS — R739 Hyperglycemia, unspecified: Secondary | ICD-10-CM | POA: Diagnosis not present

## 2017-09-14 DIAGNOSIS — Z0000 Encounter for general adult medical examination without abnormal findings: Secondary | ICD-10-CM

## 2017-09-14 DIAGNOSIS — M810 Age-related osteoporosis without current pathological fracture: Secondary | ICD-10-CM

## 2017-09-14 DIAGNOSIS — E782 Mixed hyperlipidemia: Secondary | ICD-10-CM | POA: Diagnosis not present

## 2017-09-14 DIAGNOSIS — K5900 Constipation, unspecified: Secondary | ICD-10-CM | POA: Diagnosis not present

## 2017-09-14 DIAGNOSIS — R600 Localized edema: Secondary | ICD-10-CM

## 2017-09-14 DIAGNOSIS — Z23 Encounter for immunization: Secondary | ICD-10-CM

## 2017-09-14 DIAGNOSIS — I10 Essential (primary) hypertension: Secondary | ICD-10-CM | POA: Diagnosis not present

## 2017-09-14 LAB — LIPID PANEL
CHOLESTEROL: 179 mg/dL (ref 0–200)
HDL: 76.3 mg/dL (ref >39.00)
LDL Cholesterol: 91 mg/dL (ref 0–99)
NonHDL: 103.01
TRIGLYCERIDES: 62 mg/dL (ref 0.0–149.0)
Total CHOL/HDL Ratio: 2
VLDL: 12.4 mg/dL (ref 0.0–40.0)

## 2017-09-14 LAB — CBC WITH DIFFERENTIAL/PLATELET
BASOS ABS: 0.1 10*3/uL (ref 0.0–0.1)
Basophils Relative: 1.9 % (ref 0.0–3.0)
EOS ABS: 0.2 10*3/uL (ref 0.0–0.7)
Eosinophils Relative: 5.5 % — ABNORMAL HIGH (ref 0.0–5.0)
HEMATOCRIT: 33 % — AB (ref 36.0–46.0)
Hemoglobin: 11.3 g/dL — ABNORMAL LOW (ref 12.0–15.0)
Lymphocytes Relative: 35.7 % (ref 12.0–46.0)
Lymphs Abs: 1.3 10*3/uL (ref 0.7–4.0)
MCHC: 34.2 g/dL (ref 30.0–36.0)
MCV: 104.4 fl — ABNORMAL HIGH (ref 78.0–100.0)
MONO ABS: 0.3 10*3/uL (ref 0.1–1.0)
Monocytes Relative: 7.3 % (ref 3.0–12.0)
NEUTROS ABS: 1.8 10*3/uL (ref 1.4–7.7)
Neutrophils Relative %: 49.6 % (ref 43.0–77.0)
PLATELETS: 216 10*3/uL (ref 150.0–400.0)
RBC: 3.16 Mil/uL — ABNORMAL LOW (ref 3.87–5.11)
RDW: 13.7 % (ref 11.5–15.5)
WBC: 3.6 10*3/uL — AB (ref 4.0–10.5)

## 2017-09-14 LAB — COMPREHENSIVE METABOLIC PANEL
ALBUMIN: 4.2 g/dL (ref 3.5–5.2)
ALT: 13 U/L (ref 0–35)
AST: 17 U/L (ref 0–37)
Alkaline Phosphatase: 60 U/L (ref 39–117)
BILIRUBIN TOTAL: 0.7 mg/dL (ref 0.2–1.2)
BUN: 15 mg/dL (ref 6–23)
CALCIUM: 10.1 mg/dL (ref 8.4–10.5)
CO2: 32 meq/L (ref 19–32)
CREATININE: 0.72 mg/dL (ref 0.40–1.20)
Chloride: 105 mEq/L (ref 96–112)
GFR: 80.96 mL/min (ref >60.00)
Glucose, Bld: 96 mg/dL (ref 70–99)
Potassium: 4.3 mEq/L (ref 3.5–5.1)
Sodium: 142 mEq/L (ref 135–145)
Total Protein: 6.5 g/dL (ref 6.0–8.3)

## 2017-09-14 LAB — TSH: TSH: 1.2 u[IU]/mL (ref 0.35–4.50)

## 2017-09-14 LAB — HEMOGLOBIN A1C: HEMOGLOBIN A1C: 5.9 % (ref 4.6–6.5)

## 2017-09-14 MED ORDER — DOCUSATE SODIUM 100 MG PO CAPS: 300.0000 mg | ORAL_CAPSULE | Freq: Every day | ORAL | 0 refills | Status: DC

## 2017-09-14 NOTE — Assessment & Plan Note (Addendum)
Had OP 6 years ago Discussed getting another scan and considering medication- she will think about this and discuss with gyn when she sees him

## 2017-09-14 NOTE — Assessment & Plan Note (Signed)
Diet controlled Lipid, cmp

## 2017-09-14 NOTE — Assessment & Plan Note (Signed)
Controlled with stool softeners - continue

## 2017-09-14 NOTE — Assessment & Plan Note (Signed)
Overall controlled Not on any medication - takes hctz as needed for leg swelling cmp

## 2017-09-15 ENCOUNTER — Encounter: Payer: Self-pay | Admitting: Internal Medicine

## 2017-09-17 ENCOUNTER — Encounter (INDEPENDENT_AMBULATORY_CARE_PROVIDER_SITE_OTHER): Payer: Self-pay | Admitting: Orthopaedic Surgery

## 2017-09-17 ENCOUNTER — Ambulatory Visit (INDEPENDENT_AMBULATORY_CARE_PROVIDER_SITE_OTHER): Payer: PPO | Admitting: Orthopaedic Surgery

## 2017-09-17 DIAGNOSIS — M25532 Pain in left wrist: Secondary | ICD-10-CM

## 2017-09-17 NOTE — Progress Notes (Signed)
Office Visit Note   Patient: Olivia Evans           Date of Birth: May 12, 1926           MRN: [id number] Visit Date: 09/17/2017              Requested by: Olivia Rail, MD [place], [place] [address] PCP: Olivia Rail, MD   Assessment & Plan: Visit Diagnoses:  1. Pain in left wrist     Plan: Olivia Evans had some localized areas of tenderness along the ulnar border of her left wrist.  Without injury or trauma.  Today and for the past week she has been completely asymptomatic.  My exam is normal.  I suggested she might want to purchase a short splint at 1 of the local pharmacies when she is symptomatic.  I did not think injection or x-rays were necessary today as she is completely a symptom  Follow-Up Instructions: Return if symptoms worsen or fail to improve.   Orders:  No orders of the defined types were placed in this encounter.  No orders of the defined types were placed in this encounter.     Procedures: No procedures performed   Clinical Data: No additional findings.   Subjective: No chief complaint on file. 84 year old female visited the office with episodic left wrist pain.  No history of injury or trauma.  When she does have her pain is localized along the ulnar border of the wrist.  The past week she has not had any pain.  He has not noticed any swelling or erythema.  There is been no numbness or tingling.  HPI  Review of Systems   Objective: Vital Signs: There were no vitals taken for this visit.  Physical Exam  Constitutional: She is oriented to person, place, and time. She appears well-developed and well-nourished.  HENT:  Mouth/Throat: Oropharynx is clear and moist.  Eyes: Pupils are equal, round, and reactive to light. EOM are normal.  Pulmonary/Chest: Effort normal.  Neurological: She is alert and oriented to person, place, and time.  Skin: Skin is warm and dry.  Psychiatric: She has a normal mood and affect. Her behavior is  normal.    Ortho Exam awake alert and oriented x3.  Comfortable sitting.  Examination of the left wrist was completely benign.  There was no erythema or ecchymosis.  No swelling.  No local tenderness along the ulnar border of her wrist.  No pain or evidence of instability about the distal ulna over the radial neurovascular exam intact.  No swelling of the fingers.  Normal pronation supination flexion and extension Specialty Comments:  No specialty comments available.  Imaging: No results found.   PMFS History: Patient Active Problem List   Diagnosis Date Noted  . Bilateral leg edema 09/12/2017  . Bilateral primary osteoarthritis of knee 12/22/2016  . Essential hypertension 11/23/2016  . Osteoporosis 03/26/2016  . Diverticulosis of colon without hemorrhage 11/15/2013  . Vitamin D deficiency 06/30/2012  . INTERSTITIAL CYSTITIS 10/09/2009  . Constipation 12/13/2007  . History of colonic polyps 12/13/2007  . Hyperlipidemia 08/26/2007  . SYNCOPE 06/24/2006   Past Medical History:  Diagnosis Date  . Arthritis KNEES  . Bladder pain   . Chronic constipation   . Diverticulosis 2013   Dr Fuller Plan  . Dyslipidemia   . Heart murmur   . Hemorrhoids   . History of adenomatous polyp of colon    2003 -- VILLOUS  . History  of breast cancer 2001---RIGHT BREAST CANCER   S/P PARTIAL MASTECTOMY AND RADIATION--  NO RECURRENCE  . History of palpitations   . Incomplete right bundle branch block (RBBB)   . Interstitial cystitis    Dr McDiarmid  . Lower urinary tract symptoms (LUTS)   . Normal cardiac stress test    2000  PER MD NOTE  . Osteopenia   . Wears dentures    full -upper/  partial lower  . Wears glasses     Family History  Problem Relation Age of Onset  . Heart disease Mother         CHF; no MI  . Breast cancer Sister        S/P bilateral mastectomy  . Diabetes Maternal Grandmother   . Thyroid disease Unknown        3 daughters  . Stroke Brother 80  . Colon cancer Neg Hx     . Esophageal cancer Neg Hx   . Stomach cancer Neg Hx   . Rectal cancer Neg Hx     Past Surgical History:  Procedure Laterality Date  . BENIGN TUMOR REMOVED  1960'S   SECOND RIB  . CATARACT EXTRACTION W/ INTRAOCULAR LENS  IMPLANT, BILATERAL  feb 2016  . CYSTO WITH HYDRODISTENSION  07/29/2011   Procedure: CYSTOSCOPY/HYDRODISTENSION;  Surgeon: Reece Packer, MD;  [facility];  Service: Urology;  Laterality: N/A;  with fulguration of bladder ulcer  . CYSTO WITH HYDRODISTENSION N/A 08/28/2014   Procedure: CYSTO/HYDRODISTENSION, FULGURATION OF ULCERS;  Surgeon: Bjorn Loser, MD;  Location: [facility];  Service: Urology;  Laterality: N/A;  . CYSTO/  HYDRODISTENTION/  INSTILLATION THERAPY  x2  01-18- and 11-15- 2011  . CYSTO/ HOD/  BLADDER BX'S/  FULGERATION HUNNER ULCER'S /  INSTILLATION THERAPY  10-01-2010  . PARTIAL MASTECTOMY W/ SLN DISSECTION Right 12-19-1999  . ROTATOR CUFF REPAIR    . TUBAL LIGATION    . VAGINAL HYSTERECTOMY  1970'S   WITH APPENDECTOMY  . VARICOSE VEIN SURGERY     Social History   Occupational History  . Occupation: Retired  Tobacco Use  . Smoking status: Never Smoker  . Smokeless tobacco: Never Used  Substance and Sexual Activity  . Alcohol use: No    Alcohol/week: 0.0 standard drinks  . Drug use: No  . Sexual activity: Not on file     Olivia Balding, MD   Note - This record has been created using [employer].  Chart creation errors have been sought, but may not always  have been located. Such creation errors do not reflect on  the standard of medical care.

## 2017-09-23 DIAGNOSIS — Z124 Encounter for screening for malignant neoplasm of cervix: Secondary | ICD-10-CM | POA: Diagnosis not present

## 2017-09-23 DIAGNOSIS — Z1231 Encounter for screening mammogram for malignant neoplasm of breast: Secondary | ICD-10-CM | POA: Diagnosis not present

## 2017-09-23 DIAGNOSIS — R319 Hematuria, unspecified: Secondary | ICD-10-CM | POA: Diagnosis not present

## 2017-09-23 DIAGNOSIS — N390 Urinary tract infection, site not specified: Secondary | ICD-10-CM | POA: Diagnosis not present

## 2017-09-23 DIAGNOSIS — Z6821 Body mass index (BMI) 21.0-21.9, adult: Secondary | ICD-10-CM | POA: Diagnosis not present

## 2017-12-28 ENCOUNTER — Encounter (INDEPENDENT_AMBULATORY_CARE_PROVIDER_SITE_OTHER): Payer: Self-pay | Admitting: Orthopaedic Surgery

## 2017-12-28 ENCOUNTER — Ambulatory Visit (INDEPENDENT_AMBULATORY_CARE_PROVIDER_SITE_OTHER): Payer: PPO | Admitting: Orthopaedic Surgery

## 2017-12-28 ENCOUNTER — Ambulatory Visit (INDEPENDENT_AMBULATORY_CARE_PROVIDER_SITE_OTHER): Payer: PPO

## 2017-12-28 VITALS — BP 131/58 | HR 87 | Ht 65.0 in | Wt 125.0 lb

## 2017-12-28 DIAGNOSIS — S92355A Nondisplaced fracture of fifth metatarsal bone, left foot, initial encounter for closed fracture: Secondary | ICD-10-CM

## 2017-12-28 DIAGNOSIS — M79672 Pain in left foot: Secondary | ICD-10-CM

## 2017-12-28 NOTE — Progress Notes (Signed)
Office Visit Note   Patient: Olivia Evans           Date of Birth: June 21, 1926           MRN: [id number] Visit Date: 12/28/2017              Requested by: [name], MD [place], [place] [address] PCP: [name], MD   Assessment & Plan: Visit Diagnoses:  1. Pain in left foot     Plan: Nondisplaced fracture left fifth metatarsal.  Will apply equalizer boot weightbearing as tolerated return in a week for films  Follow-Up Instructions: Return in about 1 week (around 01/04/2018).   Orders:  Orders Placed This Encounter  Procedures  . XR Foot Complete Left   No orders of the defined types were placed in this encounter.     Procedures: No procedures performed   Clinical Data: No additional findings.   Subjective: Chief Complaint  Patient presents with  . Left Foot - Pain  . Foot Pain    Fall early this morning out of recliner. Bruising on dorsal and lateral portion of left foot. No medication today, was wearing ace bandage.   Pain is localized to the dorsum of the left foot laterally with some ecchymosis and swelling  HPI  Review of Systems   Objective: Vital Signs: BP (!) 131/58 (BP Location: Left Arm, Patient Position: Sitting, Cuff Size: Normal)   Pulse 87   Ht 5\' 5"  (1.651 m)   Wt 125 lb (56.7 kg)   BMI 20.80 kg/m   Physical Exam  Constitutional: She is oriented to person, place, and time. She appears well-developed and well-nourished.  HENT:  Mouth/Throat: Oropharynx is clear and moist.  Eyes: Pupils are equal, round, and reactive to light. EOM are normal.  Pulmonary/Chest: Effort normal.  Neurological: She is alert and oriented to person, place, and time.  Skin: Skin is warm and dry.  Psychiatric: She has a normal mood and affect. Her behavior is normal.    Ortho Exam left foot with diffuse ecchymosis in the dorsum of the foot with some mild swelling.  Pain localized to the fifth metatarsal.  Skin otherwise intact  neurologically intact.  No swelling of her toes or obvious deformity.  Films demonstrate nondisplaced fracture of the fifth metatarsal  Specialty Comments:  No specialty comments available.  Imaging: Xr Foot Complete Left  Result Date: 12/28/2017 Films of the left foot demonstrate a nondisplaced long oblique fracture of the fifth metatarsal    PMFS History: Patient Active Problem List   Diagnosis Date Noted  . Bilateral leg edema 09/12/2017  . Bilateral primary osteoarthritis of knee 12/22/2016  . Essential hypertension 11/23/2016  . Osteoporosis 03/26/2016  . Diverticulosis of colon without hemorrhage 11/15/2013  . Vitamin D deficiency 06/30/2012  . INTERSTITIAL CYSTITIS 10/09/2009  . Constipation 12/13/2007  . History of colonic polyps 12/13/2007  . Hyperlipidemia 08/26/2007  . SYNCOPE 06/24/2006   Past Medical History:  Diagnosis Date  . Arthritis KNEES  . Bladder pain   . Chronic constipation   . Diverticulosis 2013   Dr Fuller Plan  . Dyslipidemia   . Heart murmur   . Hemorrhoids   . History of adenomatous polyp of colon    2003 -- VILLOUS  . History of breast cancer 2001---RIGHT BREAST CANCER   S/P PARTIAL MASTECTOMY AND RADIATION--  NO RECURRENCE  . History of palpitations   . Incomplete right bundle branch block (RBBB)   .  Interstitial cystitis    Dr McDiarmid  . Lower urinary tract symptoms (LUTS)   . Normal cardiac stress test    2000  PER MD NOTE  . Osteopenia   . Wears dentures    full -upper/  partial lower  . Wears glasses     Family History  Problem Relation Age of Onset  . Heart disease Mother         CHF; no MI  . Breast cancer Sister        S/P bilateral mastectomy  . Diabetes Maternal Grandmother   . Thyroid disease Unknown        3 daughters  . Stroke Brother 2  . Colon cancer Neg Hx   . Esophageal cancer Neg Hx   . Stomach cancer Neg Hx   . Rectal cancer Neg Hx     Past Surgical History:  Procedure Laterality Date  . BENIGN TUMOR  REMOVED  1960'S   SECOND RIB  . CATARACT EXTRACTION W/ INTRAOCULAR LENS  IMPLANT, BILATERAL  feb 2016  . CYSTO WITH HYDRODISTENSION  07/29/2011   Procedure: CYSTOSCOPY/HYDRODISTENSION;  Surgeon: Reece Packer, MD;  Location: [facility];  Service: Urology;  Laterality: N/A;  with fulguration of bladder ulcer  . CYSTO WITH HYDRODISTENSION N/A 08/28/2014   Procedure: CYSTO/HYDRODISTENSION, FULGURATION OF ULCERS;  Surgeon: Bjorn Loser, MD;  Location: [facility];  Service: Urology;  Laterality: N/A;  . CYSTO/  HYDRODISTENTION/  INSTILLATION THERAPY  x2  01-18- and 11-15- 2011  . CYSTO/ HOD/  BLADDER BX'S/  FULGERATION HUNNER ULCER'S /  INSTILLATION THERAPY  10-01-2010  . PARTIAL MASTECTOMY W/ SLN DISSECTION Right 12-19-1999  . ROTATOR CUFF REPAIR    . TUBAL LIGATION    . VAGINAL HYSTERECTOMY  1970'S   WITH APPENDECTOMY  . VARICOSE VEIN SURGERY     Social History   Occupational History  . Occupation: Retired  Tobacco Use  . Smoking status: Never Smoker  . Smokeless tobacco: Never Used  Substance and Sexual Activity  . Alcohol use: No    Alcohol/week: 0.0 standard drinks  . Drug use: No  . Sexual activity: Not on file     Garald Balding, MD   Note - This record has been created using [employer].  Chart creation errors have been sought, but may not always  have been located. Such creation errors do not reflect on  the standard of medical care.

## 2018-01-04 ENCOUNTER — Ambulatory Visit (INDEPENDENT_AMBULATORY_CARE_PROVIDER_SITE_OTHER): Payer: PPO

## 2018-01-04 ENCOUNTER — Ambulatory Visit (INDEPENDENT_AMBULATORY_CARE_PROVIDER_SITE_OTHER): Payer: PPO | Admitting: Orthopaedic Surgery

## 2018-01-04 ENCOUNTER — Encounter (INDEPENDENT_AMBULATORY_CARE_PROVIDER_SITE_OTHER): Payer: Self-pay | Admitting: Orthopaedic Surgery

## 2018-01-04 VITALS — BP 136/72 | HR 81 | Ht 65.0 in | Wt 125.0 lb

## 2018-01-04 DIAGNOSIS — M79672 Pain in left foot: Secondary | ICD-10-CM

## 2018-01-04 NOTE — Progress Notes (Signed)
Office Visit Note   Patient: Olivia Evans           Date of Birth: 05/26/1926           MRN: [id number] Visit Date: 01/04/2018              Requested by: [name], MD [place], [place] [id number] PCP: [name], MD   Assessment & Plan: Visit Diagnoses:  1. Pain in left foot     Plan: Continue weightbearing as tolerated equalizer boot and a cane.  Will reevaluate in a month and repeat films  Follow-Up Instructions: No follow-ups on file.   Orders:  Orders Placed This Encounter  Procedures  . XR Foot Complete Left   No orders of the defined types were placed in this encounter.     Procedures: No procedures performed   Clinical Data: No additional findings.   Subjective: Chief Complaint  Patient presents with  . Left Foot - Follow-up  . Foot Pain    doing better with pain , able to put weight on it   1 week post nondisplaced fracture of the left fifth metatarsal.  Wearing equalizer boot and comfortable.  With weightbearing as tolerated using four-point cane  HPI  Review of Systems   Objective: Vital Signs: BP 136/72   Pulse 81   Ht 5\' 5"  (1.651 m)   Wt 125 lb (56.7 kg)   BMI 20.80 kg/m   Physical Exam Constitutional:      Appearance: She is well-developed.  Eyes:     Pupils: Pupils are equal, round, and reactive to light.  Pulmonary:     Effort: Pulmonary effort is normal.  Skin:    General: Skin is warm and dry.  Neurological:     Mental Status: She is alert and oriented to person, place, and time.  Psychiatric:        Behavior: Behavior normal.     Ortho Exam aspect left foot with mild edema.  Skin otherwise intact.  Neurovascular exam intact  Specialty Comments:  No specialty comments available.  Imaging: No results found.   PMFS History: Patient Active Problem List   Diagnosis Date Noted  . Bilateral leg edema 09/12/2017  . Bilateral primary osteoarthritis of knee 12/22/2016  . Essential hypertension  11/23/2016  . Osteoporosis 03/26/2016  . Diverticulosis of colon without hemorrhage 11/15/2013  . Vitamin D deficiency 06/30/2012  . INTERSTITIAL CYSTITIS 10/09/2009  . Constipation 12/13/2007  . History of colonic polyps 12/13/2007  . Hyperlipidemia 08/26/2007  . SYNCOPE 06/24/2006   Past Medical History:  Diagnosis Date  . Arthritis KNEES  . Bladder pain   . Chronic constipation   . Diverticulosis 2013   Dr Fuller Plan  . Dyslipidemia   . Heart murmur   . Hemorrhoids   . History of adenomatous polyp of colon    2003 -- VILLOUS  . History of breast cancer 2001---RIGHT BREAST CANCER   S/P PARTIAL MASTECTOMY AND RADIATION--  NO RECURRENCE  . History of palpitations   . Incomplete right bundle branch block (RBBB)   . Interstitial cystitis    Dr McDiarmid  . Lower urinary tract symptoms (LUTS)   . Normal cardiac stress test    2000  PER MD NOTE  . Osteopenia   . Wears dentures    full -upper/  partial lower  . Wears glasses     Family History  Problem Relation Age of Onset  . Heart disease Mother  CHF; no MI  . Breast cancer Sister        S/P bilateral mastectomy  . Diabetes Maternal Grandmother   . Thyroid disease Unknown        3 daughters  . Stroke Brother 58  . Colon cancer Neg Hx   . Esophageal cancer Neg Hx   . Stomach cancer Neg Hx   . Rectal cancer Neg Hx     Past Surgical History:  Procedure Laterality Date  . BENIGN TUMOR REMOVED  1960'S   SECOND RIB  . CATARACT EXTRACTION W/ INTRAOCULAR LENS  IMPLANT, BILATERAL  feb 2016  . CYSTO WITH HYDRODISTENSION  07/29/2011   Procedure: CYSTOSCOPY/HYDRODISTENSION;  Surgeon: Reece Packer, MD;  Location[facility];  Service: Urology;  Laterality: N/A;  with fulguration of bladder ulcer  . CYSTO WITH HYDRODISTENSION N/A 08/28/2014   Procedure: CYSTO/HYDRODISTENSION, FULGURATION OF ULCERS;  Surgeon: Bjorn Loser, MD;  Location: [facility];  Service: Urology;  Laterality: N/A;    . CYSTO/  HYDRODISTENTION/  INSTILLATION THERAPY  x2  01-18- and 11-15- 2011  . CYSTO/ HOD/  BLADDER BX'S/  FULGERATION HUNNER ULCER'S /  INSTILLATION THERAPY  10-01-2010  . PARTIAL MASTECTOMY W/ SLN DISSECTION Right 12-19-1999  . ROTATOR CUFF REPAIR    . TUBAL LIGATION    . VAGINAL HYSTERECTOMY  1970'S   WITH APPENDECTOMY  . VARICOSE VEIN SURGERY     Social History   Occupational History  . Occupation: Retired  Tobacco Use  . Smoking status: Never Smoker  . Smokeless tobacco: Never Used  Substance and Sexual Activity  . Alcohol use: No    Alcohol/week: 0.0 standard drinks  . Drug use: No  . Sexual activity: Not on file     Garald Balding, MD   Note - This record has been created using [employer].  Chart creation errors have been sought, but may not always  have been located. Such creation errors do not reflect on  the standard of medical care.

## 2018-02-04 ENCOUNTER — Ambulatory Visit (INDEPENDENT_AMBULATORY_CARE_PROVIDER_SITE_OTHER): Payer: PPO | Admitting: Orthopaedic Surgery

## 2018-02-04 ENCOUNTER — Encounter (INDEPENDENT_AMBULATORY_CARE_PROVIDER_SITE_OTHER): Payer: Self-pay | Admitting: Orthopaedic Surgery

## 2018-02-04 ENCOUNTER — Ambulatory Visit (INDEPENDENT_AMBULATORY_CARE_PROVIDER_SITE_OTHER): Payer: PPO

## 2018-02-04 VITALS — BP 125/77 | HR 81

## 2018-02-04 DIAGNOSIS — M79672 Pain in left foot: Secondary | ICD-10-CM

## 2018-02-04 NOTE — Progress Notes (Signed)
Office Visit Note   Patient: Olivia Evans           Date of Birth: 09/16/1926           MRN: [id number] Visit Date: 02/04/2018              Requested by: [name], MD [place], [place] [id number] PCP: [name], MD   Assessment & Plan: Visit Diagnoses:  1. Pain in left foot     Plan: 6 to 7 weeks status post nondisplaced fracture of left fifth metatarsal.  Wearing the boot and not having any pain.  X-rays reveal excellent position of the fracture.  Not having any discomfort at this point so I think she can discontinue the walker wear good comfortable shoes.  We will plan to see back as necessary  Follow-Up Instructions: Return if symptoms worsen or fail to improve.   Orders:  Orders Placed This Encounter  Procedures  . XR Foot Complete Left   No orders of the defined types were placed in this encounter.     Procedures: No procedures performed   Clinical Data: No additional findings.   Subjective: Chief Complaint  Patient presents with  . Left Foot - Injury, Pain  . Foot Pain  6-7 weeks status post nondisplaced fracture of the distal third left fifth metatarsal.  Doing well in the equalizer boot without any pain.  Full weightbearing  HPI  Review of Systems   Objective: Vital Signs: BP 125/77   Pulse 81   Physical Exam  Ortho Exam no pain over the dorsum of the left foot.  Skin intact.  Neurologically intact no deformity  Specialty Comments:  No specialty comments available.  Imaging: Xr Foot Complete Left  Result Date: 02/04/2018 Films of the left foot were obtained in several projections.  The previously identified fracture of the fifth metatarsal is viewed.  There is still is some evidence of days between the fractures but clinically patient has no pain.  Excellent alignment    PMFS History: Patient Active Problem List   Diagnosis Date Noted  . Bilateral leg edema 09/12/2017  . Bilateral primary osteoarthritis of knee  12/22/2016  . Essential hypertension 11/23/2016  . Osteoporosis 03/26/2016  . Diverticulosis of colon without hemorrhage 11/15/2013  . Vitamin D deficiency 06/30/2012  . INTERSTITIAL CYSTITIS 10/09/2009  . Constipation 12/13/2007  . History of colonic polyps 12/13/2007  . Hyperlipidemia 08/26/2007  . SYNCOPE 06/24/2006   Past Medical History:  Diagnosis Date  . Arthritis KNEES  . Bladder pain   . Chronic constipation   . Diverticulosis 2013   Dr Fuller Plan  . Dyslipidemia   . Heart murmur   . Hemorrhoids   . History of adenomatous polyp of colon    2003 -- VILLOUS  . History of breast cancer 2001---RIGHT BREAST CANCER   S/P PARTIAL MASTECTOMY AND RADIATION--  NO RECURRENCE  . History of palpitations   . Incomplete right bundle branch block (RBBB)   . Interstitial cystitis    Dr McDiarmid  . Lower urinary tract symptoms (LUTS)   . Normal cardiac stress test    2000  PER MD NOTE  . Osteopenia   . Wears dentures    full -upper/  partial lower  . Wears glasses     Family History  Problem Relation Age of Onset  . Heart disease Mother         CHF; no MI  . Breast cancer Sister  S/P bilateral mastectomy  . Diabetes Maternal Grandmother   . Thyroid disease Unknown        3 daughters  . Stroke Brother 36  . Colon cancer Neg Hx   . Esophageal cancer Neg Hx   . Stomach cancer Neg Hx   . Rectal cancer Neg Hx     Past Surgical History:  Procedure Laterality Date  . BENIGN TUMOR REMOVED  1960'S   SECOND RIB  . CATARACT EXTRACTION W/ INTRAOCULAR LENS  IMPLANT, BILATERAL  feb 2016  . CYSTO WITH HYDRODISTENSION  07/29/2011   Procedure: CYSTOSCOPY/HYDRODISTENSION;  Surgeon: Reece Packer, MD;  Location[facility];  Service: Urology;  Laterality: N/A;  with fulguration of bladder ulcer  . CYSTO WITH HYDRODISTENSION N/A 08/28/2014   Procedure: CYSTO/HYDRODISTENSION, FULGURATION OF ULCERS;  Surgeon: Bjorn Loser, MD;  Location: [facility];   Service: Urology;  Laterality: N/A;  . CYSTO/  HYDRODISTENTION/  INSTILLATION THERAPY  x2  01-18- and 11-15- 2011  . CYSTO/ HOD/  BLADDER BX'S/  FULGERATION HUNNER ULCER'S /  INSTILLATION THERAPY  10-01-2010  . PARTIAL MASTECTOMY W/ SLN DISSECTION Right 12-19-1999  . ROTATOR CUFF REPAIR    . TUBAL LIGATION    . VAGINAL HYSTERECTOMY  1970'S   WITH APPENDECTOMY  . VARICOSE VEIN SURGERY     Social History   Occupational History  . Occupation: Retired  Tobacco Use  . Smoking status: Never Smoker  . Smokeless tobacco: Never Used  Substance and Sexual Activity  . Alcohol use: No    Alcohol/week: 0.0 standard drinks  . Drug use: No  . Sexual activity: Not on file     Garald Balding, MD   Note - This record has been created using [employer].  Chart creation errors have been sought, but may not always  have been located. Such creation errors do not reflect on  the standard of medical care.

## 2018-08-24 DIAGNOSIS — H353111 Nonexudative age-related macular degeneration, right eye, early dry stage: Secondary | ICD-10-CM | POA: Diagnosis not present

## 2018-09-28 DIAGNOSIS — Z1231 Encounter for screening mammogram for malignant neoplasm of breast: Secondary | ICD-10-CM | POA: Diagnosis not present

## 2018-09-28 DIAGNOSIS — Z6821 Body mass index (BMI) 21.0-21.9, adult: Secondary | ICD-10-CM | POA: Diagnosis not present

## 2018-09-28 DIAGNOSIS — Z01419 Encounter for gynecological examination (general) (routine) without abnormal findings: Secondary | ICD-10-CM | POA: Diagnosis not present

## 2018-10-01 DIAGNOSIS — H02423 Myogenic ptosis of bilateral eyelids: Secondary | ICD-10-CM | POA: Diagnosis not present

## 2018-10-01 DIAGNOSIS — D485 Neoplasm of uncertain behavior of skin: Secondary | ICD-10-CM | POA: Diagnosis not present

## 2018-10-01 DIAGNOSIS — H57813 Brow ptosis, bilateral: Secondary | ICD-10-CM | POA: Diagnosis not present

## 2018-10-01 DIAGNOSIS — H02411 Mechanical ptosis of right eyelid: Secondary | ICD-10-CM | POA: Diagnosis not present

## 2018-10-12 NOTE — Progress Notes (Signed)
Subjective:    Patient ID: Olivia Evans, female    DOB: 11/08/1926, 85 y.o.   MRN: [id number]  HPI She is here for a physical exam.     Medications and allergies reviewed with patient and updated if appropriate.  Patient Active Problem List   Diagnosis Date Noted  . Prediabetes 10/13/2018  . Bilateral leg edema 09/12/2017  . Bilateral primary osteoarthritis of knee 12/22/2016  . Essential hypertension 11/23/2016  . Osteoporosis 03/26/2016  . Diverticulosis of colon without hemorrhage 11/15/2013  . Vitamin D deficiency 06/30/2012  . INTERSTITIAL CYSTITIS 10/09/2009  . Constipation 12/13/2007  . History of colonic polyps 12/13/2007  . Hyperlipidemia 08/26/2007  . SYNCOPE 06/24/2006    Current Outpatient Medications on File Prior to Visit  Medication Sig Dispense Refill  . BIOTIN PO Take 1 capsule by mouth daily.    . Cholecalciferol (VITAMIN D3) 1000 UNITS CAPS Take 2 capsules by mouth daily.    Marland Kitchen docusate sodium (COLACE) 100 MG capsule Take 3 capsules (300 mg total) by mouth daily. 10 capsule 0  . Glucos-Chondroit-Collag-Hyal (JOINT SUPPORT FORMULA PO) Take by mouth.    . hydrochlorothiazide (MICROZIDE) 12.5 MG capsule TAKE 1 CAPSULE (12.5 MG TOTAL) BY MOUTH DAILY WHEN FEET SWELL 90 capsule 1  . magnesium oxide (MAG-OX) 400 MG tablet Take 800 mg by mouth daily.    . Multiple Vitamins-Minerals (ICAPS AREDS 2) CAPS Take 1 capsule by mouth 2 (two) times daily.    Vladimir Faster Glycol-Propyl Glycol (SYSTANE OP) Apply 1 drop to eye daily. To both eyes.     No current facility-administered medications on file prior to visit.     Past Medical History:  Diagnosis Date  . Arthritis KNEES  . Bladder pain   . Chronic constipation   . Diverticulosis 2013   Dr Fuller Plan  . Dyslipidemia   . Heart murmur   . Hemorrhoids   . History of adenomatous polyp of colon    2003 -- VILLOUS  . History of breast cancer 2001---RIGHT BREAST CANCER   S/P PARTIAL MASTECTOMY AND RADIATION--  NO  RECURRENCE  . History of palpitations   . Incomplete right bundle branch block (RBBB)   . Interstitial cystitis    Dr McDiarmid  . Lower urinary tract symptoms (LUTS)   . Normal cardiac stress test    2000  PER MD NOTE  . Osteopenia   . Wears dentures    full -upper/  partial lower  . Wears glasses     Past Surgical History:  Procedure Laterality Date  . BENIGN TUMOR REMOVED  1960'S   SECOND RIB  . CATARACT EXTRACTION W/ INTRAOCULAR LENS  IMPLANT, BILATERAL  feb 2016  . CYSTO WITH HYDRODISTENSION  07/29/2011   Procedure: CYSTOSCOPY/HYDRODISTENSION;  Surgeon: Reece Packer, MD;  Location: [facility];  Service: Urology;  Laterality: N/A;  with fulguration of bladder ulcer  . CYSTO WITH HYDRODISTENSION N/A 08/28/2014   Procedure: CYSTO/HYDRODISTENSION, FULGURATION OF ULCERS;  Surgeon: Bjorn Loser, MD;  Location: [facility];  Service: Urology;  Laterality: N/A;  . CYSTO/  HYDRODISTENTION/  INSTILLATION THERAPY  x2  01-18- and 11-15- 2011  . CYSTO/ HOD/  BLADDER BX'S/  FULGERATION HUNNER ULCER'S /  INSTILLATION THERAPY  10-01-2010  . PARTIAL MASTECTOMY W/ SLN DISSECTION Right 12-19-1999  . ROTATOR CUFF REPAIR    . TUBAL LIGATION    . VAGINAL HYSTERECTOMY  1970'S   WITH APPENDECTOMY  . VARICOSE VEIN SURGERY  Social History   Socioeconomic History  . Marital status: Widowed    Spouse name: Not on file  . Number of children: 4  . Years of education: Not on file  . Highest education level: Not on file  Occupational History  . Occupation: Retired  Scientific laboratory technician  . Financial resource strain: Not on file  . Food insecurity    Worry: Not on file    Inability: Not on file  . Transportation needs    Medical: Not on file    Non-medical: Not on file  Tobacco Use  . Smoking status: Never Smoker  . Smokeless tobacco: Never Used  Substance and Sexual Activity  . Alcohol use: No    Alcohol/week: 0.0 standard drinks  . Drug use: No  .  Sexual activity: Not on file  Lifestyle  . Physical activity    Days per week: Not on file    Minutes per session: Not on file  . Stress: Not on file  Relationships  . Social Herbalist on phone: Not on file    Gets together: Not on file    Attends religious service: Not on file    Active member of club or organization: Not on file    Attends meetings of clubs or organizations: Not on file    Relationship status: Not on file  Other Topics Concern  . Not on file  Social History Narrative  . Not on file    Family History  Problem Relation Age of Onset  . Heart disease Mother         CHF; no MI  . Breast cancer Sister        S/P bilateral mastectomy  . Diabetes Maternal Grandmother   . Thyroid disease Unknown        3 daughters  . Stroke Brother 58  . Colon cancer Neg Hx   . Esophageal cancer Neg Hx   . Stomach cancer Neg Hx   . Rectal cancer Neg Hx     Review of Systems  Constitutional: Negative for appetite change, chills, fatigue and fever.  Eyes: Positive for visual disturbance (occ double vision).  Respiratory: Negative for cough, shortness of breath and wheezing.   Cardiovascular: Positive for leg swelling (intermitent - takes hctz prn). Negative for chest pain and palpitations.  Gastrointestinal: Positive for constipation. Negative for abdominal pain, blood in stool and diarrhea.  Genitourinary: Negative for dysuria and hematuria.  Musculoskeletal: Positive for arthralgias.  Skin: Negative for color change and rash.  Neurological: Negative for dizziness, light-headedness and headaches.  Psychiatric/Behavioral: Positive for sleep disturbance (due nocturia). Negative for dysphoric mood. The patient is not nervous/anxious.        Objective:   Vitals:   10/13/18 1339  BP: 140/62  Pulse: 76  Resp: 16  Temp: 97.7 F (36.5 C)  SpO2: 97%   Filed Weights   10/13/18 1339  Weight: 131 lb (59.4 kg)   Body mass index is 21.8 kg/m.  BP Readings from  Last 3 Encounters:  10/13/18 140/62  02/04/18 125/77  01/04/18 136/72    Wt Readings from Last 3 Encounters:  10/13/18 131 lb (59.4 kg)  01/04/18 125 lb (56.7 kg)  12/28/17 125 lb (56.7 kg)     Physical Exam Constitutional: She appears well-developed and well-nourished. No distress.  HENT:  Head: Normocephalic and atraumatic.  Right Ear: External ear normal. Normal ear canal and TM Left Ear: External ear normal.  Normal ear canal and  TM Mouth/Throat: Oropharynx is clear and moist.  Eyes: Conjunctivae and EOM are normal.  Neck: Neck supple. No tracheal deviation present. No thyromegaly present.  No carotid bruit  Cardiovascular: Normal rate, regular rhythm and normal heart sounds.   No murmur heard.  No edema. Pulmonary/Chest: Effort normal and breath sounds normal. No respiratory distress. She has no wheezes. She has no rales.  Breast: deferred   Abdominal: Soft. She exhibits no distension. There is no tenderness.  Lymphadenopathy: She has no cervical adenopathy.  Skin: Skin is warm and dry. She is not diaphoretic.  Psychiatric: She has a normal mood and affect. Her behavior is normal.        Assessment & Plan:   Physical exam: Screening blood work    ordered Immunizations  Flu vaccine today, discussed shingrix Colonoscopy    Aged out Mammogram    Aged out Gyn   Dr Nori Riis  - Up to date  Dexa  Deferred - does not want any treatment Eye exams   Up to date  Exercise  Active around house, no regular exercise Weight   Normal BMI Substance abuse   none  See Problem List for Assessment and Plan of chronic medical problems.   FU in one year

## 2018-10-13 ENCOUNTER — Other Ambulatory Visit: Payer: Self-pay

## 2018-10-13 ENCOUNTER — Encounter: Payer: Self-pay | Admitting: Internal Medicine

## 2018-10-13 ENCOUNTER — Ambulatory Visit (INDEPENDENT_AMBULATORY_CARE_PROVIDER_SITE_OTHER): Payer: PPO | Admitting: Internal Medicine

## 2018-10-13 ENCOUNTER — Other Ambulatory Visit (INDEPENDENT_AMBULATORY_CARE_PROVIDER_SITE_OTHER): Payer: PPO

## 2018-10-13 VITALS — BP 140/62 | HR 76 | Temp 97.7°F | Resp 16 | Ht 65.0 in | Wt 131.0 lb

## 2018-10-13 DIAGNOSIS — Z0000 Encounter for general adult medical examination without abnormal findings: Secondary | ICD-10-CM | POA: Diagnosis not present

## 2018-10-13 DIAGNOSIS — I10 Essential (primary) hypertension: Secondary | ICD-10-CM | POA: Diagnosis not present

## 2018-10-13 DIAGNOSIS — R030 Elevated blood-pressure reading, without diagnosis of hypertension: Secondary | ICD-10-CM

## 2018-10-13 DIAGNOSIS — M810 Age-related osteoporosis without current pathological fracture: Secondary | ICD-10-CM

## 2018-10-13 DIAGNOSIS — R7303 Prediabetes: Secondary | ICD-10-CM

## 2018-10-13 DIAGNOSIS — R600 Localized edema: Secondary | ICD-10-CM

## 2018-10-13 DIAGNOSIS — Z23 Encounter for immunization: Secondary | ICD-10-CM | POA: Diagnosis not present

## 2018-10-13 DIAGNOSIS — K5900 Constipation, unspecified: Secondary | ICD-10-CM

## 2018-10-13 DIAGNOSIS — E559 Vitamin D deficiency, unspecified: Secondary | ICD-10-CM | POA: Diagnosis not present

## 2018-10-13 DIAGNOSIS — E782 Mixed hyperlipidemia: Secondary | ICD-10-CM | POA: Diagnosis not present

## 2018-10-13 LAB — CBC WITH DIFFERENTIAL/PLATELET
Basophils Absolute: 0.1 10*3/uL (ref 0.0–0.1)
Basophils Relative: 2.6 % (ref 0.0–3.0)
Eosinophils Absolute: 0.2 10*3/uL (ref 0.0–0.7)
Eosinophils Relative: 6.4 % — ABNORMAL HIGH (ref 0.0–5.0)
HCT: 32.1 % — ABNORMAL LOW (ref 36.0–46.0)
Hemoglobin: 10.8 g/dL — ABNORMAL LOW (ref 12.0–15.0)
Lymphocytes Relative: 40.9 % (ref 12.0–46.0)
Lymphs Abs: 1.4 10*3/uL (ref 0.7–4.0)
MCHC: 33.8 g/dL (ref 30.0–36.0)
MCV: 107.6 fl — ABNORMAL HIGH (ref 78.0–100.0)
Monocytes Absolute: 0.2 10*3/uL (ref 0.1–1.0)
Monocytes Relative: 7 % (ref 3.0–12.0)
Neutro Abs: 1.5 10*3/uL (ref 1.4–7.7)
Neutrophils Relative %: 43.1 % (ref 43.0–77.0)
Platelets: 231 10*3/uL (ref 150.0–400.0)
RBC: 2.98 Mil/uL — ABNORMAL LOW (ref 3.87–5.11)
RDW: 14.2 % (ref 11.5–15.5)
WBC: 3.5 10*3/uL — ABNORMAL LOW (ref 4.0–10.5)

## 2018-10-13 LAB — COMPREHENSIVE METABOLIC PANEL
ALT: 12 U/L (ref 0–35)
AST: 17 U/L (ref 0–37)
Albumin: 4.4 g/dL (ref 3.5–5.2)
Alkaline Phosphatase: 64 U/L (ref 39–117)
BUN: 14 mg/dL (ref 6–23)
CO2: 31 mEq/L (ref 19–32)
Calcium: 10.3 mg/dL (ref 8.4–10.5)
Chloride: 101 mEq/L (ref 96–112)
Creatinine, Ser: 0.72 mg/dL (ref 0.40–1.20)
GFR: 75.99 mL/min (ref >60.00)
Glucose, Bld: 92 mg/dL (ref 70–99)
Potassium: 4.1 mEq/L (ref 3.5–5.1)
Sodium: 140 mEq/L (ref 135–145)
Total Bilirubin: 0.6 mg/dL (ref 0.2–1.2)
Total Protein: 6.5 g/dL (ref 6.0–8.3)

## 2018-10-13 LAB — LIPID PANEL
Cholesterol: 177 mg/dL (ref 0–200)
HDL: 65 mg/dL (ref >39.00)
LDL Cholesterol: 97 mg/dL (ref 0–99)
NonHDL: 112.44
Total CHOL/HDL Ratio: 3
Triglycerides: 77 mg/dL (ref 0.0–149.0)
VLDL: 15.4 mg/dL (ref 0.0–40.0)

## 2018-10-13 LAB — TSH: TSH: 1.08 u[IU]/mL (ref 0.35–4.50)

## 2018-10-13 LAB — HEMOGLOBIN A1C: Hgb A1c MFr Bld: 5.7 % (ref 4.6–6.5)

## 2018-10-13 MED ORDER — HYDROCHLOROTHIAZIDE 12.5 MG PO CAPS: ORAL_CAPSULE | ORAL | 1 refills | Status: DC

## 2018-10-13 NOTE — Assessment & Plan Note (Signed)
Check a1c Low sugar / carb diet Stressed regular exercise   

## 2018-10-13 NOTE — Assessment & Plan Note (Addendum)
hctz prn - rarely takes it Leg edema controlled cmp

## 2018-10-13 NOTE — Assessment & Plan Note (Signed)
BP Readings from Last 3 Encounters:  10/13/18 140/62  02/04/18 125/77  01/04/18 136/72    BP well controlled Current regimen effective and well tolerated Continue current medications at current doses cmp

## 2018-10-13 NOTE — Assessment & Plan Note (Signed)
Taking vitamin d daily 

## 2018-10-13 NOTE — Assessment & Plan Note (Signed)
Discussed otc medications that may help

## 2018-10-13 NOTE — Patient Instructions (Addendum)
You can try magnesium citrate supplement ( 200-300 ml daily), smooth move or miralax.     Tests ordered today. Your results will be released to [place] (or called to you) after review.  If any changes need to be made, you will be notified at that same time.  All other Health Maintenance issues reviewed.   All recommended immunizations and age-appropriate screenings are up-to-date or discussed.  Flu immunization administered today.    Medications reviewed and updated.  Changes include :   none  Your prescription(s) have been submitted to your pharmacy. Please take as directed and contact our office if you believe you are having problem(s) with the medication(s).   Please followup in 1 year    Health Maintenance, Female Adopting a healthy lifestyle and getting preventive care are important in promoting health and wellness. Ask your health care provider about:  The right schedule for you to have regular tests and exams.  Things you can do on your own to prevent diseases and keep yourself healthy. What should I know about diet, weight, and exercise? Eat a healthy diet   Eat a diet that includes plenty of vegetables, fruits, low-fat dairy products, and lean protein.  Do not eat a lot of foods that are high in solid fats, added sugars, or sodium. Maintain a healthy weight Body mass index (BMI) is used to identify weight problems. It estimates body fat based on height and weight. Your health care provider can help determine your BMI and help you achieve or maintain a healthy weight. Get regular exercise Get regular exercise. This is one of the most important things you can do for your health. Most adults should:  Exercise for at least 150 minutes each week. The exercise should increase your heart rate and make you sweat (moderate-intensity exercise).  Do strengthening exercises at least twice a week. This is in addition to the moderate-intensity exercise.  Spend less time sitting.  Even light physical activity can be beneficial. Watch cholesterol and blood lipids Have your blood tested for lipids and cholesterol at 83 years of age, then have this test every 5 years. Have your cholesterol levels checked more often if:  Your lipid or cholesterol levels are high.  You are older than 83 years of age.  You are at high risk for heart disease. What should I know about cancer screening? Depending on your health history and family history, you may need to have cancer screening at various ages. This may include screening for:  Breast cancer.  Cervical cancer.  Colorectal cancer.  Skin cancer.  Lung cancer. What should I know about heart disease, diabetes, and high blood pressure? Blood pressure and heart disease  High blood pressure causes heart disease and increases the risk of stroke. This is more likely to develop in people who have high blood pressure readings, are of African descent, or are overweight.  Have your blood pressure checked: ? Every 3-5 years if you are 25-31 years of age. ? Every year if you are 44 years old or older. Diabetes Have regular diabetes screenings. This checks your fasting blood sugar level. Have the screening done:  Once every three years after age 98 if you are at a normal weight and have a low risk for diabetes.  More often and at a younger age if you are overweight or have a high risk for diabetes. What should I know about preventing infection? Hepatitis B If you have a higher risk for hepatitis B, you should  be screened for this virus. Talk with your health care provider to find out if you are at risk for hepatitis B infection. Hepatitis C Testing is recommended for:  Everyone born from [birth year] through 1965.  Anyone with known risk factors for hepatitis C. Sexually transmitted infections (STIs)  Get screened for STIs, including gonorrhea and chlamydia, if: ? You are sexually active and are younger than 83 years of age. ?  You are older than 83 years of age and your health care provider tells you that you are at risk for this type of infection. ? Your sexual activity has changed since you were last screened, and you are at increased risk for chlamydia or gonorrhea. Ask your health care provider if you are at risk.  Ask your health care provider about whether you are at high risk for HIV. Your health care provider may recommend a prescription medicine to help prevent HIV infection. If you choose to take medicine to prevent HIV, you should first get tested for HIV. You should then be tested every 3 months for as long as you are taking the medicine. Pregnancy  If you are about to stop having your period (premenopausal) and you may become pregnant, seek counseling before you get pregnant.  Take 400 to 800 micrograms (mcg) of folic acid every day if you become pregnant.  Ask for birth control (contraception) if you want to prevent pregnancy. Osteoporosis and menopause Osteoporosis is a disease in which the bones lose minerals and strength with aging. This can result in bone fractures. If you are 54 years old or older, or if you are at risk for osteoporosis and fractures, ask your health care provider if you should:  Be screened for bone loss.  Take a calcium or vitamin D supplement to lower your risk of fractures.  Be given hormone replacement therapy (HRT) to treat symptoms of menopause. Follow these instructions at home: Lifestyle  Do not use any products that contain nicotine or tobacco, such as cigarettes, e-cigarettes, and chewing tobacco. If you need help quitting, ask your health care provider.  Do not use street drugs.  Do not share needles.  Ask your health care provider for help if you need support or information about quitting drugs. Alcohol use  Do not drink alcohol if: ? Your health care provider tells you not to drink. ? You are pregnant, may be pregnant, or are planning to become pregnant.   If you drink alcohol: ? Limit how much you use to 0-1 drink a day. ? Limit intake if you are breastfeeding.  Be aware of how much alcohol is in your drink. In the [place] one drink equals one 12 oz bottle of beer (355 mL), one 5 oz glass of wine (148 mL), or one 1 oz glass of hard liquor (44 mL). General instructions  Schedule regular health, dental, and eye exams.  Stay current with your vaccines.  Tell your health care provider if: ? You often feel depressed. ? You have ever been abused or do not feel safe at home. Summary  Adopting a healthy lifestyle and getting preventive care are important in promoting health and wellness.  Follow your health care provider's instructions about healthy diet, exercising, and getting tested or screened for diseases.  Follow your health care provider's instructions on monitoring your cholesterol and blood pressure. This information is not intended to replace advice given to you by your health care provider. Make sure you discuss any questions you have with your  health care provider. Document Released: 07/22/2010 Document Revised: 12/30/2017 Document Reviewed: 12/30/2017 Elsevier Patient Education  2020 [employer].

## 2018-10-13 NOTE — Assessment & Plan Note (Signed)
Had myalgias with statin in the past Check lipid panel  Diet controlled Regular exercise and healthy diet encouraged

## 2018-10-13 NOTE — Assessment & Plan Note (Addendum)
Deferred dexa - does not want to consider treatment No regular exercise Taking vitamin d, can not take calcium due to constipation

## 2018-10-15 ENCOUNTER — Other Ambulatory Visit (INDEPENDENT_AMBULATORY_CARE_PROVIDER_SITE_OTHER): Payer: PPO

## 2018-10-15 DIAGNOSIS — D649 Anemia, unspecified: Secondary | ICD-10-CM

## 2018-10-15 LAB — IRON: Iron: 126 ug/dL (ref 42–145)

## 2018-10-21 ENCOUNTER — Other Ambulatory Visit: Payer: Self-pay | Admitting: Internal Medicine

## 2018-10-21 DIAGNOSIS — R799 Abnormal finding of blood chemistry, unspecified: Secondary | ICD-10-CM

## 2018-10-27 ENCOUNTER — Other Ambulatory Visit (INDEPENDENT_AMBULATORY_CARE_PROVIDER_SITE_OTHER): Payer: PPO

## 2018-10-27 DIAGNOSIS — R799 Abnormal finding of blood chemistry, unspecified: Secondary | ICD-10-CM

## 2018-10-27 LAB — CBC WITH DIFFERENTIAL/PLATELET
Basophils Absolute: 0.1 10*3/uL (ref 0.0–0.1)
Basophils Relative: 3.4 % — ABNORMAL HIGH (ref 0.0–3.0)
Eosinophils Absolute: 0.2 10*3/uL (ref 0.0–0.7)
Eosinophils Relative: 6.2 % — ABNORMAL HIGH (ref 0.0–5.0)
HCT: 29.7 % — ABNORMAL LOW (ref 36.0–46.0)
Hemoglobin: 10.1 g/dL — ABNORMAL LOW (ref 12.0–15.0)
Lymphocytes Relative: 35.9 % (ref 12.0–46.0)
Lymphs Abs: 1.4 10*3/uL (ref 0.7–4.0)
MCHC: 34 g/dL (ref 30.0–36.0)
MCV: 107 fl — ABNORMAL HIGH (ref 78.0–100.0)
Monocytes Absolute: 0.2 10*3/uL (ref 0.1–1.0)
Monocytes Relative: 5.7 % (ref 3.0–12.0)
Neutro Abs: 1.9 10*3/uL (ref 1.4–7.7)
Neutrophils Relative %: 48.8 % (ref 43.0–77.0)
Platelets: 207 10*3/uL (ref 150.0–400.0)
RBC: 2.77 Mil/uL — ABNORMAL LOW (ref 3.87–5.11)
RDW: 14.2 % (ref 11.5–15.5)
WBC: 3.9 10*3/uL — ABNORMAL LOW (ref 4.0–10.5)

## 2018-10-27 LAB — FOLATE: Folate: 10.7 ng/mL (ref >5.9)

## 2018-10-27 LAB — VITAMIN B12: Vitamin B-12: 410 pg/mL (ref 211–911)

## 2018-11-08 ENCOUNTER — Other Ambulatory Visit: Payer: Self-pay | Admitting: Ophthalmology

## 2018-11-08 DIAGNOSIS — D485 Neoplasm of uncertain behavior of skin: Secondary | ICD-10-CM | POA: Diagnosis not present

## 2018-11-08 DIAGNOSIS — H02423 Myogenic ptosis of bilateral eyelids: Secondary | ICD-10-CM | POA: Diagnosis not present

## 2018-11-08 DIAGNOSIS — H57819 Brow ptosis, unspecified: Secondary | ICD-10-CM | POA: Diagnosis not present

## 2018-11-08 DIAGNOSIS — H02411 Mechanical ptosis of right eyelid: Secondary | ICD-10-CM | POA: Diagnosis not present

## 2018-11-08 DIAGNOSIS — L722 Steatocystoma multiplex: Secondary | ICD-10-CM | POA: Diagnosis not present

## 2018-11-08 DIAGNOSIS — H02429 Myogenic ptosis of unspecified eyelid: Secondary | ICD-10-CM | POA: Diagnosis not present

## 2018-11-08 DIAGNOSIS — D487 Neoplasm of uncertain behavior of other specified sites: Secondary | ICD-10-CM | POA: Diagnosis not present

## 2018-11-24 DIAGNOSIS — D23111 Other benign neoplasm of skin of right upper eyelid, including canthus: Secondary | ICD-10-CM | POA: Diagnosis not present

## 2018-11-24 DIAGNOSIS — H57813 Brow ptosis, bilateral: Secondary | ICD-10-CM | POA: Diagnosis not present

## 2018-11-24 DIAGNOSIS — H02423 Myogenic ptosis of bilateral eyelids: Secondary | ICD-10-CM | POA: Diagnosis not present

## 2018-11-28 ENCOUNTER — Telehealth: Payer: Self-pay | Admitting: Internal Medicine

## 2018-11-28 DIAGNOSIS — D649 Anemia, unspecified: Secondary | ICD-10-CM

## 2018-11-28 NOTE — Telephone Encounter (Signed)
Copied from [place] [id number]. Topic: Referral - Status >> Nov 26, 2018  3:53 PM Reyne Dumas L wrote: Reason for CRM:  Pt states that she was told by nurse that gave her lab results that she would need to go to a hematologist, but she has heard nothing more about that. Pt can be reached at [phone]

## 2018-11-28 NOTE — Telephone Encounter (Signed)
Referral ordered - I was not aware she agreed to see hem.  They will call her.

## 2018-11-28 NOTE — Telephone Encounter (Signed)
Copied from [place] [id number]. Topic: Referral - Status >> Nov 26, 2018  3:53 PM Reyne Dumas L wrote: Reason for CRM:  Pt states that she was told by nurse that gave her lab results that she would need to go to a hematologist, but she has heard nothing more about that. Pt can be reached at [phone]

## 2018-11-29 NOTE — Telephone Encounter (Signed)
LVM letting pt know.  

## 2019-05-18 ENCOUNTER — Encounter: Payer: Self-pay | Admitting: Orthopaedic Surgery

## 2019-05-18 ENCOUNTER — Other Ambulatory Visit: Payer: Self-pay

## 2019-05-18 ENCOUNTER — Ambulatory Visit: Payer: PPO | Admitting: Orthopaedic Surgery

## 2019-05-18 VITALS — Ht 65.0 in | Wt 131.0 lb

## 2019-05-18 DIAGNOSIS — M170 Bilateral primary osteoarthritis of knee: Secondary | ICD-10-CM | POA: Diagnosis not present

## 2019-05-18 MED ORDER — BUPIVACAINE HCL 0.5 % IJ SOLN
2.0000 mL | INTRAMUSCULAR | Status: AC | PRN
  Administered 2019-05-18: 2 mL via INTRA_ARTICULAR

## 2019-05-18 MED ORDER — LIDOCAINE HCL 1 % IJ SOLN
2.0000 mL | INTRAMUSCULAR | Status: AC | PRN
  Administered 2019-05-18: 15:00:00 2 mL

## 2019-05-18 MED ORDER — METHYLPREDNISOLONE ACETATE 40 MG/ML IJ SUSP
80.0000 mg | INTRAMUSCULAR | Status: AC | PRN
  Administered 2019-05-18: 80 mg via INTRA_ARTICULAR

## 2019-05-18 NOTE — Progress Notes (Signed)
Office Visit Note   Patient: Olivia Evans           Date of Birth: July 17, 1926           MRN: [id number] Visit Date: 05/18/2019              Requested by: [name], MD [place],  [place] [address] PCP: [name], MD   Assessment & Plan: Visit Diagnoses:  1. Bilateral primary osteoarthritis of knee     Plan: End-stage osteoarthritis both knees.  Will inject the right knee with cortisone today and have her return in 2 weeks and inject the left  Follow-Up Instructions: Return in about 2 weeks (around 06/01/2019).   Orders:  Orders Placed This Encounter  Procedures  . Large Joint Inj: R knee   No orders of the defined types were placed in this encounter.     Procedures: Large Joint Inj: R knee on 05/18/2019 3:20 PM Indications: pain and diagnostic evaluation Details: 25 G 1.5 in needle, anteromedial approach  Arthrogram: No  Medications: 2 mL lidocaine 1 %; 2 mL bupivacaine 0.5 %; 80 mg methylPREDNISolone acetate 40 MG/ML Procedure, treatment alternatives, risks and benefits explained, specific risks discussed. Consent was given by the patient. Immediately prior to procedure a time out was called to verify the correct patient, procedure, equipment, support staff and site/side marked as required. Patient was prepped and draped in the usual sterile fashion.       Clinical Data: No additional findings.   Subjective: Chief Complaint  Patient presents with  . Right Knee - Pain  Patient presents today for recurrent right knee pain. She was last here in 2018 and received a right knee cortisone injection. She said that she has done very well until the last 2-97months. She has pain in the medial aspect of her knee. She said that it bothers her more with driving. She is wanting to get another injection today. She is not diabetic. She does not take anything for pain.   HPI  Review of Systems   Objective: Vital Signs: Ht 5\' 5"  (1.651 m)   Wt 131  lb (59.4 kg)   BMI 21.80 kg/m   Physical Exam Constitutional:      Appearance: She is well-developed.  Eyes:     Pupils: Pupils are equal, round, and reactive to light.  Pulmonary:     Effort: Pulmonary effort is normal.  Skin:    General: Skin is warm and dry.  Neurological:     Mental Status: She is alert and oriented to person, place, and time.  Psychiatric:        Behavior: Behavior normal.     Ortho Exam awake alert and oriented x3.  Comfortable sitting.  Right knee with small effusion.  Predominately medial joint pain.  Some patellar crepitation but no pain with compression.  No pain laterally.  Lacks a few degrees to full extension flexed over 100 degrees without instability.  No popliteal pain or mass.  No calf pain  Specialty Comments:  No specialty comments available.  Imaging: No results found.   PMFS History: Patient Active Problem List   Diagnosis Date Noted  . Anemia 11/28/2018  . Prediabetes 10/13/2018  . Bilateral leg edema 09/12/2017  . Bilateral primary osteoarthritis of knee 12/22/2016  . Essential hypertension 11/23/2016  . Osteoporosis 03/26/2016  . Diverticulosis of colon without hemorrhage 11/15/2013  . Vitamin D deficiency 06/30/2012  . INTERSTITIAL CYSTITIS 10/09/2009  .  Constipation 12/13/2007  . History of colonic polyps 12/13/2007  . Hyperlipidemia 08/26/2007  . SYNCOPE 06/24/2006   Past Medical History:  Diagnosis Date  . Arthritis KNEES  . Bladder pain   . Chronic constipation   . Diverticulosis 2013   Dr Fuller Plan  . Dyslipidemia   . Heart murmur   . Hemorrhoids   . History of adenomatous polyp of colon    2003 -- VILLOUS  . History of breast cancer 2001---RIGHT BREAST CANCER   S/P PARTIAL MASTECTOMY AND RADIATION--  NO RECURRENCE  . History of palpitations   . Incomplete right bundle branch block (RBBB)   . Interstitial cystitis    Dr McDiarmid  . Lower urinary tract symptoms (LUTS)   . Normal cardiac stress test    2000   PER MD NOTE  . Osteopenia   . Wears dentures    full -upper/  partial lower  . Wears glasses     Family History  Problem Relation Age of Onset  . Heart disease Mother         CHF; no MI  . Breast cancer Sister        S/P bilateral mastectomy  . Diabetes Maternal Grandmother   . Thyroid disease Other        3 daughters  . Stroke Brother 90  . Colon cancer Neg Hx   . Esophageal cancer Neg Hx   . Stomach cancer Neg Hx   . Rectal cancer Neg Hx     Past Surgical History:  Procedure Laterality Date  . BENIGN TUMOR REMOVED  1960'S   SECOND RIB  . CATARACT EXTRACTION W/ INTRAOCULAR LENS  IMPLANT, BILATERAL  feb 2016  . CYSTO WITH HYDRODISTENSION  07/29/2011   Procedure: CYSTOSCOPY/HYDRODISTENSION;  Surgeon: Reece Packer, MD;  Location: [facility];  Service: Urology;  Laterality: N/A;  with fulguration of bladder ulcer  . CYSTO WITH HYDRODISTENSION N/A 08/28/2014   Procedure: CYSTO/HYDRODISTENSION, FULGURATION OF ULCERS;  Surgeon: Bjorn Loser, MD;  Location: [facility];  Service: Urology;  Laterality: N/A;  . CYSTO/  HYDRODISTENTION/  INSTILLATION THERAPY  x2  01-18- and 11-15- 2011  . CYSTO/ HOD/  BLADDER BX'S/  FULGERATION HUNNER ULCER'S /  INSTILLATION THERAPY  10-01-2010  . PARTIAL MASTECTOMY W/ SLN DISSECTION Right 12-19-1999  . ROTATOR CUFF REPAIR    . TUBAL LIGATION    . VAGINAL HYSTERECTOMY  1970'S   WITH APPENDECTOMY  . VARICOSE VEIN SURGERY     Social History   Occupational History  . Occupation: Retired  Tobacco Use  . Smoking status: Never Smoker  . Smokeless tobacco: Never Used  Substance and Sexual Activity  . Alcohol use: No    Alcohol/week: 0.0 standard drinks  . Drug use: No  . Sexual activity: Not on file

## 2019-08-30 DIAGNOSIS — H353131 Nonexudative age-related macular degeneration, bilateral, early dry stage: Secondary | ICD-10-CM | POA: Diagnosis not present

## 2019-10-10 DIAGNOSIS — Z124 Encounter for screening for malignant neoplasm of cervix: Secondary | ICD-10-CM | POA: Diagnosis not present

## 2019-10-10 DIAGNOSIS — Z90710 Acquired absence of both cervix and uterus: Secondary | ICD-10-CM | POA: Diagnosis not present

## 2019-10-10 DIAGNOSIS — Z1272 Encounter for screening for malignant neoplasm of vagina: Secondary | ICD-10-CM | POA: Diagnosis not present

## 2019-10-10 DIAGNOSIS — Z6822 Body mass index (BMI) 22.0-22.9, adult: Secondary | ICD-10-CM | POA: Diagnosis not present

## 2019-10-10 DIAGNOSIS — Z1231 Encounter for screening mammogram for malignant neoplasm of breast: Secondary | ICD-10-CM | POA: Diagnosis not present

## 2019-10-13 NOTE — Patient Instructions (Addendum)
Consider getting a tetanus vaccine at your pharmacy.     Blood work was ordered.    All other Health Maintenance issues reviewed.   All recommended immunizations and age-appropriate screenings are up-to-date or discussed.  Flu immunization administered today.    Medications reviewed and updated.  Changes include : none        Please followup in 1 year    Health Maintenance, Female Adopting a healthy lifestyle and getting preventive care are important in promoting health and wellness. Ask your health care provider about:  The right schedule for you to have regular tests and exams.  Things you can do on your own to prevent diseases and keep yourself healthy. What should I know about diet, weight, and exercise? Eat a healthy diet   Eat a diet that includes plenty of vegetables, fruits, low-fat dairy products, and lean protein.  Do not eat a lot of foods that are high in solid fats, added sugars, or sodium. Maintain a healthy weight Body mass index (BMI) is used to identify weight problems. It estimates body fat based on height and weight. Your health care provider can help determine your BMI and help you achieve or maintain a healthy weight. Get regular exercise Get regular exercise. This is one of the most important things you can do for your health. Most adults should:  Exercise for at least 150 minutes each week. The exercise should increase your heart rate and make you sweat (moderate-intensity exercise).  Do strengthening exercises at least twice a week. This is in addition to the moderate-intensity exercise.  Spend less time sitting. Even light physical activity can be beneficial. Watch cholesterol and blood lipids Have your blood tested for lipids and cholesterol at 84 years of age, then have this test every 5 years. Have your cholesterol levels checked more often if:  Your lipid or cholesterol levels are high.  You are older than 84 years of age.  You are at high  risk for heart disease. What should I know about cancer screening? Depending on your health history and family history, you may need to have cancer screening at various ages. This may include screening for:  Breast cancer.  Cervical cancer.  Colorectal cancer.  Skin cancer.  Lung cancer. What should I know about heart disease, diabetes, and high blood pressure? Blood pressure and heart disease  High blood pressure causes heart disease and increases the risk of stroke. This is more likely to develop in people who have high blood pressure readings, are of African descent, or are overweight.  Have your blood pressure checked: ? Every 3-5 years if you are 48-65 years of age. ? Every year if you are 89 years old or older. Diabetes Have regular diabetes screenings. This checks your fasting blood sugar level. Have the screening done:  Once every three years after age 24 if you are at a normal weight and have a low risk for diabetes.  More often and at a younger age if you are overweight or have a high risk for diabetes. What should I know about preventing infection? Hepatitis B If you have a higher risk for hepatitis B, you should be screened for this virus. Talk with your health care provider to find out if you are at risk for hepatitis B infection. Hepatitis C Testing is recommended for:  Everyone born from [birth year] through 1965.  Anyone with known risk factors for hepatitis C. Sexually transmitted infections (STIs)  Get screened for STIs, including gonorrhea  and chlamydia, if: ? You are sexually active and are younger than 84 years of age. ? You are older than 84 years of age and your health care provider tells you that you are at risk for this type of infection. ? Your sexual activity has changed since you were last screened, and you are at increased risk for chlamydia or gonorrhea. Ask your health care provider if you are at risk.  Ask your health care provider about whether you  are at high risk for HIV. Your health care provider may recommend a prescription medicine to help prevent HIV infection. If you choose to take medicine to prevent HIV, you should first get tested for HIV. You should then be tested every 3 months for as long as you are taking the medicine. Pregnancy  If you are about to stop having your period (premenopausal) and you may become pregnant, seek counseling before you get pregnant.  Take 400 to 800 micrograms (mcg) of folic acid every day if you become pregnant.  Ask for birth control (contraception) if you want to prevent pregnancy. Osteoporosis and menopause Osteoporosis is a disease in which the bones lose minerals and strength with aging. This can result in bone fractures. If you are 26 years old or older, or if you are at risk for osteoporosis and fractures, ask your health care provider if you should:  Be screened for bone loss.  Take a calcium or vitamin D supplement to lower your risk of fractures.  Be given hormone replacement therapy (HRT) to treat symptoms of menopause. Follow these instructions at home: Lifestyle  Do not use any products that contain nicotine or tobacco, such as cigarettes, e-cigarettes, and chewing tobacco. If you need help quitting, ask your health care provider.  Do not use street drugs.  Do not share needles.  Ask your health care provider for help if you need support or information about quitting drugs. Alcohol use  Do not drink alcohol if: ? Your health care provider tells you not to drink. ? You are pregnant, may be pregnant, or are planning to become pregnant.  If you drink alcohol: ? Limit how much you use to 0-1 drink a day. ? Limit intake if you are breastfeeding.  Be aware of how much alcohol is in your drink. In the [place] one drink equals one 12 oz bottle of beer (355 mL), one 5 oz glass of wine (148 mL), or one 1 oz glass of hard liquor (44 mL). General instructions  Schedule regular  health, dental, and eye exams.  Stay current with your vaccines.  Tell your health care provider if: ? You often feel depressed. ? You have ever been abused or do not feel safe at home. Summary  Adopting a healthy lifestyle and getting preventive care are important in promoting health and wellness.  Follow your health care provider's instructions about healthy diet, exercising, and getting tested or screened for diseases.  Follow your health care provider's instructions on monitoring your cholesterol and blood pressure. This information is not intended to replace advice given to you by your health care provider. Make sure you discuss any questions you have with your health care provider. Document Revised: 12/30/2017 Document Reviewed: 12/30/2017 Elsevier Patient Education  2020 [employer].

## 2019-10-13 NOTE — Progress Notes (Signed)
Subjective:    Patient ID: Olivia Evans, female    DOB: February 19, 1926, 86 y.o.   MRN: [id number]  HPI She is here for a physical exam.   She has some R earache intermittent.  She has had work on her dentures - they were made tighter.  She denies pain today.    Medications and allergies reviewed with patient and updated if appropriate.  Patient Active Problem List   Diagnosis Date Noted  . Anemia 11/28/2018  . Prediabetes 10/13/2018  . Bilateral leg edema 09/12/2017  . Bilateral primary osteoarthritis of knee 12/22/2016  . Osteoporosis 03/26/2016  . Diverticulosis of colon without hemorrhage 11/15/2013  . Vitamin D deficiency 06/30/2012  . INTERSTITIAL CYSTITIS 10/09/2009  . Constipation 12/13/2007  . History of colonic polyps 12/13/2007  . SYNCOPE 06/24/2006    Current Outpatient Medications on File Prior to Visit  Medication Sig Dispense Refill  . BIOTIN PO Take 1 capsule by mouth daily.    . Cholecalciferol (VITAMIN D3) 1000 UNITS CAPS Take 2 capsules by mouth daily.    Marland Kitchen docusate sodium (COLACE) 100 MG capsule Take 3 capsules (300 mg total) by mouth daily. 10 capsule 0  . Glucos-Chondroit-Collag-Hyal (JOINT SUPPORT FORMULA PO) Take by mouth.    . hydrochlorothiazide (MICROZIDE) 12.5 MG capsule TAKE 1 CAPSULE (12.5 MG TOTAL) BY MOUTH DAILY WHEN FEET SWELL 30 capsule 1  . Multiple Vitamins-Minerals (PRESERVISION AREDS 2 PO) Take 1 tablet by mouth 2 (two) times daily.    Vladimir Faster Glycol-Propyl Glycol (SYSTANE OP) Apply 1 drop to eye daily. To both eyes.    . Pumpkin Seed-Soy Germ (AZO BLADDER CONTROL/GO-LESS) CAPS Take 1 capsule by mouth daily.     No current facility-administered medications on file prior to visit.    Past Medical History:  Diagnosis Date  . Arthritis KNEES  . Bladder pain   . Chronic constipation   . Diverticulosis 2013   Dr Fuller Plan  . Dyslipidemia   . Heart murmur   . Hemorrhoids   . History of adenomatous polyp of colon    2003 -- VILLOUS    . History of breast cancer 2001---RIGHT BREAST CANCER   S/P PARTIAL MASTECTOMY AND RADIATION--  NO RECURRENCE  . History of palpitations   . Incomplete right bundle branch block (RBBB)   . Interstitial cystitis    Dr McDiarmid  . Lower urinary tract symptoms (LUTS)   . Normal cardiac stress test    2000  PER MD NOTE  . Osteopenia   . Wears dentures    full -upper/  partial lower  . Wears glasses     Past Surgical History:  Procedure Laterality Date  . BENIGN TUMOR REMOVED  1960'S   SECOND RIB  . CATARACT EXTRACTION W/ INTRAOCULAR LENS  IMPLANT, BILATERAL  feb 2016  . CYSTO WITH HYDRODISTENSION  07/29/2011   Procedure: CYSTOSCOPY/HYDRODISTENSION;  Surgeon: Reece Packer, MD;  Location: [facility];  Service: Urology;  Laterality: N/A;  with fulguration of bladder ulcer  . CYSTO WITH HYDRODISTENSION N/A 08/28/2014   Procedure: CYSTO/HYDRODISTENSION, FULGURATION OF ULCERS;  Surgeon: Bjorn Loser, MD;  Location: [facility];  Service: Urology;  Laterality: N/A;  . CYSTO/  HYDRODISTENTION/  INSTILLATION THERAPY  x2  01-18- and 11-15- 2011  . CYSTO/ HOD/  BLADDER BX'S/  FULGERATION HUNNER ULCER'S /  INSTILLATION THERAPY  10-01-2010  . PARTIAL MASTECTOMY W/ SLN DISSECTION Right 12-19-1999  . ROTATOR CUFF REPAIR    .  TUBAL LIGATION    . VAGINAL HYSTERECTOMY  1970'S   WITH APPENDECTOMY  . VARICOSE VEIN SURGERY      Social History   Socioeconomic History  . Marital status: Widowed    Spouse name: Not on file  . Number of children: 4  . Years of education: Not on file  . Highest education level: Not on file  Occupational History  . Occupation: Retired  Tobacco Use  . Smoking status: Never Smoker  . Smokeless tobacco: Never Used  Substance and Sexual Activity  . Alcohol use: No    Alcohol/week: 0.0 standard drinks  . Drug use: No  . Sexual activity: Not on file  Other Topics Concern  . Not on file  Social History Narrative  . Not on file    Social Determinants of Health   Financial Resource Strain:   . Difficulty of Paying Living Expenses: Not on file  Food Insecurity:   . Worried About Charity fundraiser in the Last Year: Not on file  . Ran Out of Food in the Last Year: Not on file  Transportation Needs:   . Lack of Transportation (Medical): Not on file  . Lack of Transportation (Non-Medical): Not on file  Physical Activity:   . Days of Exercise per Week: Not on file  . Minutes of Exercise per Session: Not on file  Stress:   . Feeling of Stress : Not on file  Social Connections:   . Frequency of Communication with Friends and Family: Not on file  . Frequency of Social Gatherings with Friends and Family: Not on file  . Attends Religious Services: Not on file  . Active Member of Clubs or Organizations: Not on file  . Attends Archivist Meetings: Not on file  . Marital Status: Not on file    Family History  Problem Relation Age of Onset  . Heart disease Mother         CHF; no MI  . Breast cancer Sister        S/P bilateral mastectomy  . Diabetes Maternal Grandmother   . Thyroid disease Other        3 daughters  . Stroke Brother 17  . Colon cancer Neg Hx   . Esophageal cancer Neg Hx   . Stomach cancer Neg Hx   . Rectal cancer Neg Hx     Review of Systems  Constitutional: Negative for chills and fever.  HENT: Positive for rhinorrhea.   Respiratory: Negative for cough, shortness of breath and wheezing.   Cardiovascular: Positive for leg swelling. Negative for chest pain and palpitations.  Gastrointestinal: Positive for constipation. Negative for abdominal pain, blood in stool, diarrhea and nausea.       No gerd  Genitourinary: Negative for dysuria and hematuria.  Musculoskeletal: Positive for arthralgias (R knee).  Skin: Negative for rash.       Dry skin, a few abn moles, spots  Neurological: Positive for dizziness (occ, with looking up) and light-headedness. Negative for headaches.   Psychiatric/Behavioral: Negative for dysphoric mood. The patient is not nervous/anxious.        Objective:   Vitals:   10/14/19 1420  BP: (!) 146/72  Pulse: 76  Temp: 97.7 F (36.5 C)  SpO2: 98%   Filed Weights   10/14/19 1420  Weight: 131 lb 6.4 oz (59.6 kg)   Body mass index is 21.87 kg/m.  BP Readings from Last 3 Encounters:  10/14/19 (!) 146/72  10/13/18 140/62  02/04/18 125/77    Wt Readings from Last 3 Encounters:  10/14/19 131 lb 6.4 oz (59.6 kg)  05/18/19 131 lb (59.4 kg)  10/13/18 131 lb (59.4 kg)     Physical Exam Constitutional: She appears well-developed and well-nourished. No distress.  HENT:  Head: Normocephalic and atraumatic.  Right Ear: External ear normal. Normal ear canal and TM Left Ear: External ear normal.  Normal ear canal and TM Mouth/Throat: Oropharynx is clear and moist.  Eyes: Conjunctivae and EOM are normal.  Neck: Neck supple. No tracheal deviation present. No thyromegaly present.  No carotid bruit  Cardiovascular: Normal rate, regular rhythm and normal heart sounds.   No murmur heard.  No edema. Pulmonary/Chest: Effort normal and breath sounds normal. No respiratory distress. She has no wheezes. She has no rales.  Breast: deferred   Abdominal: Soft. She exhibits no distension. There is no tenderness.  Lymphadenopathy: She has no cervical adenopathy.  Skin: Skin is warm and dry. She is not diaphoretic.  Psychiatric: She has a normal mood and affect. Her behavior is normal.        Assessment & Plan:   Physical exam: Screening blood work    ordered Immunizations  Flu vaccine today, advised tdap Colonoscopy  N/a due to age 31  Up to date Gyn  Dr Nori Riis Dexa   deferred Eye exams  Up to date  Exercise   active Weight  Normal BMI Substance abuse    none  See Problem List for Assessment and Plan of chronic medical problems.   This visit occurred during the SARS-CoV-2 public health emergency.  Safety protocols were in  place, including screening questions prior to the visit, additional usage of staff PPE, and extensive cleaning of exam room while observing appropriate contact time as indicated for disinfecting solutions.

## 2019-10-14 ENCOUNTER — Other Ambulatory Visit: Payer: Self-pay

## 2019-10-14 ENCOUNTER — Ambulatory Visit (INDEPENDENT_AMBULATORY_CARE_PROVIDER_SITE_OTHER): Payer: PPO | Admitting: Internal Medicine

## 2019-10-14 ENCOUNTER — Encounter: Payer: Self-pay | Admitting: Internal Medicine

## 2019-10-14 VITALS — BP 146/72 | HR 76 | Temp 97.7°F | Wt 131.4 lb

## 2019-10-14 DIAGNOSIS — R600 Localized edema: Secondary | ICD-10-CM | POA: Diagnosis not present

## 2019-10-14 DIAGNOSIS — Z23 Encounter for immunization: Secondary | ICD-10-CM | POA: Diagnosis not present

## 2019-10-14 DIAGNOSIS — M810 Age-related osteoporosis without current pathological fracture: Secondary | ICD-10-CM

## 2019-10-14 DIAGNOSIS — Z0000 Encounter for general adult medical examination without abnormal findings: Secondary | ICD-10-CM | POA: Diagnosis not present

## 2019-10-14 DIAGNOSIS — K5900 Constipation, unspecified: Secondary | ICD-10-CM

## 2019-10-14 DIAGNOSIS — R7303 Prediabetes: Secondary | ICD-10-CM

## 2019-10-14 DIAGNOSIS — D649 Anemia, unspecified: Secondary | ICD-10-CM | POA: Diagnosis not present

## 2019-10-14 NOTE — Assessment & Plan Note (Addendum)
Chronic Intermittent Takes hctz prn only CMP

## 2019-10-14 NOTE — Assessment & Plan Note (Signed)
Chronic Check a1c 

## 2019-10-14 NOTE — Assessment & Plan Note (Signed)
Chronic Deferred dexa and any treatment Taking vitamin d

## 2019-10-14 NOTE — Assessment & Plan Note (Signed)
Chronic Cbc, iron, ferritin

## 2019-10-14 NOTE — Assessment & Plan Note (Addendum)
Chronic Taking prune lax prn 4 stool softeners daily Eats greens Controlled Continue above

## 2019-10-15 LAB — CBC WITH DIFFERENTIAL/PLATELET
Absolute Monocytes: 234 cells/uL (ref 200–950)
Basophils Absolute: 51 cells/uL (ref 0–200)
Basophils Relative: 1.6 %
Eosinophils Absolute: 141 cells/uL (ref 15–500)
Eosinophils Relative: 4.4 %
HCT: 30.8 % — ABNORMAL LOW (ref 35.0–45.0)
Hemoglobin: 10.6 g/dL — ABNORMAL LOW (ref 11.7–15.5)
Lymphs Abs: 1139 cells/uL (ref 850–3900)
MCH: 36.3 pg — ABNORMAL HIGH (ref 27.0–33.0)
MCHC: 34.4 g/dL (ref 32.0–36.0)
MCV: 105.5 fL — ABNORMAL HIGH (ref 80.0–100.0)
MPV: 9.9 fL (ref 7.5–12.5)
Monocytes Relative: 7.3 %
Neutro Abs: 1635 cells/uL (ref 1500–7800)
Neutrophils Relative %: 51.1 %
Platelets: 248 10*3/uL (ref 140–400)
RBC: 2.92 10*6/uL — ABNORMAL LOW (ref 3.80–5.10)
RDW: 12.8 % (ref 11.0–15.0)
Total Lymphocyte: 35.6 %
WBC: 3.2 10*3/uL — ABNORMAL LOW (ref 3.8–10.8)

## 2019-10-15 LAB — LIPID PANEL
Cholesterol: 192 mg/dL (ref <200)
HDL: 74 mg/dL (ref >=50)
LDL Cholesterol (Calc): 101 mg/dL (calc) — ABNORMAL HIGH
Non-HDL Cholesterol (Calc): 118 mg/dL (calc) (ref <130)
Total CHOL/HDL Ratio: 2.6 (calc) (ref <5.0)
Triglycerides: 77 mg/dL (ref <150)

## 2019-10-15 LAB — COMPREHENSIVE METABOLIC PANEL
AG Ratio: 2.4 (calc) (ref 1.0–2.5)
ALT: 13 U/L (ref 6–29)
AST: 17 U/L (ref 10–35)
Albumin: 4.6 g/dL (ref 3.6–5.1)
Alkaline phosphatase (APISO): 61 U/L (ref 37–153)
BUN: 13 mg/dL (ref 7–25)
CO2: 33 mmol/L — ABNORMAL HIGH (ref 20–32)
Calcium: 10.7 mg/dL — ABNORMAL HIGH (ref 8.6–10.4)
Chloride: 102 mmol/L (ref 98–110)
Creat: 0.63 mg/dL (ref 0.60–0.88)
Globulin: 1.9 g/dL (calc) (ref 1.9–3.7)
Glucose, Bld: 92 mg/dL (ref 65–99)
Potassium: 4.3 mmol/L (ref 3.5–5.3)
Sodium: 141 mmol/L (ref 135–146)
Total Bilirubin: 0.7 mg/dL (ref 0.2–1.2)
Total Protein: 6.5 g/dL (ref 6.1–8.1)

## 2019-10-15 LAB — FERRITIN: Ferritin: 182 ng/mL (ref 16–288)

## 2019-10-15 LAB — HEMOGLOBIN A1C
Hgb A1c MFr Bld: 5.5 % of total Hgb (ref <5.7)
Mean Plasma Glucose: 111 (calc)
eAG (mmol/L): 6.2 (calc)

## 2019-10-15 LAB — TSH: TSH: 1.57 mIU/L (ref 0.40–4.50)

## 2019-10-15 LAB — IRON: Iron: 113 ug/dL (ref 45–160)

## 2019-10-25 DIAGNOSIS — H26491 Other secondary cataract, right eye: Secondary | ICD-10-CM | POA: Diagnosis not present

## 2019-10-25 DIAGNOSIS — H353131 Nonexudative age-related macular degeneration, bilateral, early dry stage: Secondary | ICD-10-CM | POA: Diagnosis not present

## 2019-10-26 ENCOUNTER — Other Ambulatory Visit: Payer: Self-pay

## 2019-10-26 ENCOUNTER — Ambulatory Visit: Payer: PPO

## 2019-11-01 DIAGNOSIS — Z9841 Cataract extraction status, right eye: Secondary | ICD-10-CM | POA: Diagnosis not present

## 2019-11-15 DIAGNOSIS — H26492 Other secondary cataract, left eye: Secondary | ICD-10-CM | POA: Diagnosis not present

## 2019-11-22 DIAGNOSIS — Z961 Presence of intraocular lens: Secondary | ICD-10-CM | POA: Diagnosis not present

## 2019-12-28 DIAGNOSIS — C44619 Basal cell carcinoma of skin of left upper limb, including shoulder: Secondary | ICD-10-CM | POA: Diagnosis not present

## 2019-12-28 DIAGNOSIS — D485 Neoplasm of uncertain behavior of skin: Secondary | ICD-10-CM | POA: Diagnosis not present

## 2020-02-08 DIAGNOSIS — C44619 Basal cell carcinoma of skin of left upper limb, including shoulder: Secondary | ICD-10-CM | POA: Diagnosis not present

## 2020-04-11 ENCOUNTER — Telehealth: Payer: Self-pay | Admitting: Internal Medicine

## 2020-04-11 NOTE — Telephone Encounter (Signed)
LVM for pt to rtn my call to schedule awv with nha. Please schedule this appt if pt calls the office.

## 2020-04-12 ENCOUNTER — Ambulatory Visit (INDEPENDENT_AMBULATORY_CARE_PROVIDER_SITE_OTHER): Payer: PPO

## 2020-04-12 DIAGNOSIS — Z0000 Encounter for general adult medical examination without abnormal findings: Secondary | ICD-10-CM

## 2020-04-12 NOTE — Patient Instructions (Signed)
Olivia Evans , Thank you for taking time to come for your Medicare Wellness Visit. I appreciate your ongoing commitment to your health goals. Please review the following plan we discussed and let me know if I can assist you in the future.   Screening recommendations/referrals: Colonoscopy: last done 05/12/2011; not a candidate for routine cancer screening due to age 87: 10/10/2019 Bone Density: 05/29/2011 Recommended yearly ophthalmology/optometry visit for glaucoma screening and checkup Recommended yearly dental visit for hygiene and checkup  Vaccinations: Influenza vaccine: 10/14/2019 Pneumococcal vaccine: 04/01/2011, 05/12/2011 Tdap vaccine: declined Shingles vaccine: never done   Covid-19: 08/22/2019, 09/20/2019  Advanced directives: Please bring a copy of your health care power of attorney and living will to the office at your convenience.  Conditions/risks identified: Yes. Reviewed health maintenance screenings with patient today and relevant education, vaccines, and/or referrals were provided. Continue doing brain stimulating activities (puzzles, reading, adult coloring books, staying active) to keep memory sharp. Continue to eat heart healthy diet (full of fruits, vegetables, whole grains, lean protein, water--limit salt, fat, and sugar intake) and increase physical activity as tolerated.  Next appointment: Please schedule your next Medicare Wellness Visit with your Nurse Health Advisor in 1 year by calling [phone].   Preventive Care 85 Years and Older, Female Preventive care refers to lifestyle choices and visits with your health care provider that can promote health and wellness. What does preventive care include?  A yearly physical exam. This is also called an annual well check.  Dental exams once or twice a year.  Routine eye exams. Ask your health care provider how often you should have your eyes checked.  Personal lifestyle choices, including:  Daily care of your  teeth and gums.  Regular physical activity.  Eating a healthy diet.  Avoiding tobacco and drug use.  Limiting alcohol use.  Practicing safe sex.  Taking low-dose aspirin every day.  Taking vitamin and mineral supplements as recommended by your health care provider. What happens during an annual well check? The services and screenings done by your health care provider during your annual well check will depend on your age, overall health, lifestyle risk factors, and family history of disease. Counseling  Your health care provider may ask you questions about your:  Alcohol use.  Tobacco use.  Drug use.  Emotional well-being.  Home and relationship well-being.  Sexual activity.  Eating habits.  History of falls.  Memory and ability to understand (cognition).  Work and work Statistician.  Reproductive health. Screening  You may have the following tests or measurements:  Height, weight, and BMI.  Blood pressure.  Lipid and cholesterol levels. These may be checked every 5 years, or more frequently if you are over 74 years old.  Skin check.  Lung cancer screening. You may have this screening every year starting at age 74 if you have a 30-pack-year history of smoking and currently smoke or have quit within the past 15 years.  Fecal occult blood test (FOBT) of the stool. You may have this test every year starting at age 76.  Flexible sigmoidoscopy or colonoscopy. You may have a sigmoidoscopy every 5 years or a colonoscopy every 10 years starting at age 73.  Hepatitis C blood test.  Hepatitis B blood test.  Sexually transmitted disease (STD) testing.  Diabetes screening. This is done by checking your blood sugar (glucose) after you have not eaten for a while (fasting). You may have this done every 1-3 years.  Bone density scan. This is done to  screen for osteoporosis. You may have this done starting at age 77.  Mammogram. This may be done every 1-2 years. Talk  to your health care provider about how often you should have regular mammograms. Talk with your health care provider about your test results, treatment options, and if necessary, the need for more tests. Vaccines  Your health care provider may recommend certain vaccines, such as:  Influenza vaccine. This is recommended every year.  Tetanus, diphtheria, and acellular pertussis (Tdap, Td) vaccine. You may need a Td booster every 10 years.  Zoster vaccine. You may need this after age 57.  Pneumococcal 13-valent conjugate (PCV13) vaccine. One dose is recommended after age 28.  Pneumococcal polysaccharide (PPSV23) vaccine. One dose is recommended after age 45. Talk to your health care provider about which screenings and vaccines you need and how often you need them. This information is not intended to replace advice given to you by your health care provider. Make sure you discuss any questions you have with your health care provider. Document Released: 02/02/2015 Document Revised: 09/26/2015 Document Reviewed: 11/07/2014 Elsevier Interactive Patient Education  2017 [employer] Prevention in the Home Falls can cause injuries. They can happen to people of all ages. There are many things you can do to make your home safe and to help prevent falls. What can I do on the outside of my home?  Regularly fix the edges of walkways and driveways and fix any cracks.  Remove anything that might make you trip as you walk through a door, such as a raised step or threshold.  Trim any bushes or trees on the path to your home.  Use bright outdoor lighting.  Clear any walking paths of anything that might make someone trip, such as rocks or tools.  Regularly check to see if handrails are loose or broken. Make sure that both sides of any steps have handrails.  Any raised decks and porches should have guardrails on the edges.  Have any leaves, snow, or ice cleared regularly.  Use sand or salt on  walking paths during winter.  Clean up any spills in your garage right away. This includes oil or grease spills. What can I do in the bathroom?  Use night lights.  Install grab bars by the toilet and in the tub and shower. Do not use towel bars as grab bars.  Use non-skid mats or decals in the tub or shower.  If you need to sit down in the shower, use a plastic, non-slip stool.  Keep the floor dry. Clean up any water that spills on the floor as soon as it happens.  Remove soap buildup in the tub or shower regularly.  Attach bath mats securely with double-sided non-slip rug tape.  Do not have throw rugs and other things on the floor that can make you trip. What can I do in the bedroom?  Use night lights.  Make sure that you have a light by your bed that is easy to reach.  Do not use any sheets or blankets that are too big for your bed. They should not hang down onto the floor.  Have a firm chair that has side arms. You can use this for support while you get dressed.  Do not have throw rugs and other things on the floor that can make you trip. What can I do in the kitchen?  Clean up any spills right away.  Avoid walking on wet floors.  Keep items that  you use a lot in easy-to-reach places.  If you need to reach something above you, use a strong step stool that has a grab bar.  Keep electrical cords out of the way.  Do not use floor polish or wax that makes floors slippery. If you must use wax, use non-skid floor wax.  Do not have throw rugs and other things on the floor that can make you trip. What can I do with my stairs?  Do not leave any items on the stairs.  Make sure that there are handrails on both sides of the stairs and use them. Fix handrails that are broken or loose. Make sure that handrails are as long as the stairways.  Check any carpeting to make sure that it is firmly attached to the stairs. Fix any carpet that is loose or worn.  Avoid having throw  rugs at the top or bottom of the stairs. If you do have throw rugs, attach them to the floor with carpet tape.  Make sure that you have a light switch at the top of the stairs and the bottom of the stairs. If you do not have them, ask someone to add them for you. What else can I do to help prevent falls?  Wear shoes that:  Do not have high heels.  Have rubber bottoms.  Are comfortable and fit you well.  Are closed at the toe. Do not wear sandals.  If you use a stepladder:  Make sure that it is fully opened. Do not climb a closed stepladder.  Make sure that both sides of the stepladder are locked into place.  Ask someone to hold it for you, if possible.  Clearly mark and make sure that you can see:  Any grab bars or handrails.  First and last steps.  Where the edge of each step is.  Use tools that help you move around (mobility aids) if they are needed. These include:  Canes.  Walkers.  Scooters.  Crutches.  Turn on the lights when you go into a dark area. Replace any light bulbs as soon as they burn out.  Set up your furniture so you have a clear path. Avoid moving your furniture around.  If any of your floors are uneven, fix them.  If there are any pets around you, be aware of where they are.  Review your medicines with your doctor. Some medicines can make you feel dizzy. This can increase your chance of falling. Ask your doctor what other things that you can do to help prevent falls. This information is not intended to replace advice given to you by your health care provider. Make sure you discuss any questions you have with your health care provider. Document Released: 11/02/2008 Document Revised: 06/14/2015 Document Reviewed: 02/10/2014 Elsevier Interactive Patient Education  2017 [employer].

## 2020-04-12 NOTE — Progress Notes (Signed)
I connected with Humna Bowdish today by telephone and verified that I am speaking with the correct person using two identifiers. Location patient: home Location provider: work Persons participating in the virtual visit: Karlin Talamantez and [employer], [place].   I discussed the limitations, risks, security and privacy concerns of performing an evaluation and management service by telephone and the availability of in person appointments. I also discussed with the patient that there may be a patient responsible charge related to this service. The patient expressed understanding and verbally consented to this telephonic visit.    Interactive audio and video telecommunications were attempted between this provider and patient, however failed, due to patient having technical difficulties OR patient did not have access to video capability.  We continued and completed visit with audio only.  Some vital signs may be absent or patient reported.   Time Spent with patient on telephone encounter: 30 minutes  Subjective:   Olivia Evans is a 87 y.o. female who presents for Medicare Annual (Subsequent) preventive examination.  Review of Systems    No ROS. Medicare Wellness Visit. Additional risk factors are reflected in social history. Cardiac Risk Factors include: advanced age (>44men, >43 women);family history of premature cardiovascular disease     Objective:    There were no vitals filed for this visit. There is no height or weight on file to calculate BMI.  Advanced Directives 04/12/2020 11/01/2014 08/28/2014  Does Patient Have a Medical Advance Directive? Yes Yes Yes  Type of Paramedic of [place];Living will - Living will  Does patient want to make changes to medical advance directive? No - Patient declined - No - Patient declined  Copy of [place] in Chart? No - copy requested No - copy requested No - copy requested    Current Medications  (verified) Outpatient Encounter Medications as of 04/12/2020  Medication Sig  . BIOTIN PO Take 1 capsule by mouth daily.  . Cholecalciferol (VITAMIN D3) 1000 UNITS CAPS Take 2 capsules by mouth daily.  Marland Kitchen docusate sodium (COLACE) 100 MG capsule Take 3 capsules (300 mg total) by mouth daily.  . Glucos-Chondroit-Collag-Hyal (JOINT SUPPORT FORMULA PO) Take by mouth.  . hydrochlorothiazide (MICROZIDE) 12.5 MG capsule TAKE 1 CAPSULE (12.5 MG TOTAL) BY MOUTH DAILY WHEN FEET SWELL  . Multiple Vitamins-Minerals (PRESERVISION AREDS 2 PO) Take 1 tablet by mouth 2 (two) times daily.  Vladimir Faster Glycol-Propyl Glycol (SYSTANE OP) Apply 1 drop to eye daily. To both eyes.  . Pumpkin Seed-Soy Germ (AZO BLADDER CONTROL/GO-LESS) CAPS Take 1 capsule by mouth daily.   No facility-administered encounter medications on file as of 04/12/2020.    Allergies (verified) Premarin [conjugated estrogens], Vicodin [hydrocodone-acetaminophen], Adhesive [tape], Fluvastatin sodium, and Simvastatin   History: Past Medical History:  Diagnosis Date  . Arthritis KNEES  . Bladder pain   . Chronic constipation   . Diverticulosis 2013   Dr Fuller Plan  . Dyslipidemia   . Heart murmur   . Hemorrhoids   . History of adenomatous polyp of colon    2003 -- VILLOUS  . History of breast cancer 2001---RIGHT BREAST CANCER   S/P PARTIAL MASTECTOMY AND RADIATION--  NO RECURRENCE  . History of palpitations   . Incomplete right bundle branch block (RBBB)   . Interstitial cystitis    Dr McDiarmid  . Lower urinary tract symptoms (LUTS)   . Normal cardiac stress test    2000  PER MD NOTE  . Osteopenia   .  Wears dentures    full -upper/  partial lower  . Wears glasses    Past Surgical History:  Procedure Laterality Date  . BENIGN TUMOR REMOVED  1960'S   SECOND RIB  . CATARACT EXTRACTION W/ INTRAOCULAR LENS  IMPLANT, BILATERAL  feb 2016  . CYSTO WITH HYDRODISTENSION  07/29/2011   Procedure: CYSTOSCOPY/HYDRODISTENSION;  Surgeon: Reece Packer, MD;  Location: [facility];  Service: Urology;  Laterality: N/A;  with fulguration of bladder ulcer  . CYSTO WITH HYDRODISTENSION N/A 08/28/2014   Procedure: CYSTO/HYDRODISTENSION, FULGURATION OF ULCERS;  Surgeon: Bjorn Loser, MD;  Location: [facility];  Service: Urology;  Laterality: N/A;  . CYSTO/  HYDRODISTENTION/  INSTILLATION THERAPY  x2  01-18- and 11-15- 2011  . CYSTO/ HOD/  BLADDER BX'S/  FULGERATION HUNNER ULCER'S /  INSTILLATION THERAPY  10-01-2010  . PARTIAL MASTECTOMY W/ SLN DISSECTION Right 12-19-1999  . ROTATOR CUFF REPAIR    . TUBAL LIGATION    . VAGINAL HYSTERECTOMY  1970'S   WITH APPENDECTOMY  . VARICOSE VEIN SURGERY     Family History  Problem Relation Age of Onset  . Heart disease Mother         CHF; no MI  . Breast cancer Sister        S/P bilateral mastectomy  . Diabetes Maternal Grandmother   . Thyroid disease Other        3 daughters  . Stroke Brother 29  . Colon cancer Neg Hx   . Esophageal cancer Neg Hx   . Stomach cancer Neg Hx   . Rectal cancer Neg Hx    Social History   Socioeconomic History  . Marital status: Widowed    Spouse name: Not on file  . Number of children: 4  . Years of education: Not on file  . Highest education level: Not on file  Occupational History  . Occupation: Retired  Tobacco Use  . Smoking status: Never Smoker  . Smokeless tobacco: Never Used  Substance and Sexual Activity  . Alcohol use: No    Alcohol/week: 0.0 standard drinks  . Drug use: No  . Sexual activity: Not on file  Other Topics Concern  . Not on file  Social History Narrative  . Not on file   Social Determinants of Health   Financial Resource Strain: Low Risk   . Difficulty of Paying Living Expenses: Not hard at all  Food Insecurity: No Food Insecurity  . Worried About Charity fundraiser in the Last Year: Never true  . Ran Out of Food in the Last Year: Never true  Transportation Needs: No  Transportation Needs  . Lack of Transportation (Medical): No  . Lack of Transportation (Non-Medical): No  Physical Activity: Sufficiently Active  . Days of Exercise per Week: 5 days  . Minutes of Exercise per Session: 30 min  Stress: No Stress Concern Present  . Feeling of Stress : Not at all  Social Connections: Moderately Integrated  . Frequency of Communication with Friends and Family: More than three times a week  . Frequency of Social Gatherings with Friends and Family: More than three times a week  . Attends Religious Services: More than 4 times per year  . Active Member of Clubs or Organizations: No  . Attends Archivist Meetings: More than 4 times per year  . Marital Status: Widowed    Tobacco Counseling Counseling given: Not Answered   Clinical Intake:  Pre-visit preparation completed: Yes  Pain : No/denies pain     Nutritional Risks: None Diabetes: No  How often do you need to have someone help you when you read instructions, pamphlets, or other written materials from your doctor or pharmacy?: 1 - Never What is the last grade level you completed in school?: High School Graduate  Diabetic? no  Interpreter Needed?: No  Information entered by :: Lisette Abu, LPN   Activities of Daily Living In your present state of health, do you have any difficulty performing the following activities: 04/12/2020  Hearing? Y  Comment wears hearing aids  Vision? N  Difficulty concentrating or making decisions? N  Walking or climbing stairs? N  Dressing or bathing? N  Doing errands, shopping? N  Preparing Food and eating ? N  Using the Toilet? N  In the past six months, have you accidently leaked urine? Y  Comment wears pads for protection  Do you have problems with loss of bowel control? N  Managing your Medications? N  Managing your Finances? N  Housekeeping or managing your Housekeeping? N  Some recent data might be hidden    Patient Care  Team: [name], MD as PCP - General (Internal Medicine)  Indicate any recent Medical Services you may have received from other than [facility] providers in the past year (date may be approximate).     Assessment:   This is a routine wellness examination for Emiline.  Hearing/Vision screen No exam data present  Dietary issues and exercise activities discussed: Current Exercise Habits: Home exercise routine, Type of exercise: walking;Other - see comments (yard work, house work), Time (Minutes): 30, Frequency (Times/Week): 5, Weekly Exercise (Minutes/Week): 150, Intensity: Mild, Exercise limited by: None identified  Goals    .  patient  (pt-stated)      Will continue to maintain health as is;     .  Patient Stated      To maintain my current health status by continuing to eat healthy, stay physically active and socially active.      Depression Screen PHQ 2/9 Scores 04/12/2020 10/14/2019 10/13/2018 09/14/2017 10/15/2016 11/01/2014 06/30/2012  PHQ - 2 Score 0 0 0 0 0 0 0    Fall Risk Fall Risk  04/12/2020 10/14/2019 10/13/2018 09/14/2017 10/15/2016  Falls in the past year? 0 0 0 No No  Number falls in past yr: 0 0 0 - -  Injury with Fall? 0 0 - - -  Risk for fall due to : No Fall Risks - - - -  Follow up Falls evaluation completed - - - -  Comment - - - - -    FALL RISK PREVENTION PERTAINING TO THE HOME:  Any stairs in or around the home? Yes  If so, are there any without handrails? No  Home free of loose throw rugs in walkways, pet beds, electrical cords, etc? Yes  Adequate lighting in your home to reduce risk of falls? Yes   ASSISTIVE DEVICES UTILIZED TO PREVENT FALLS:  Life alert? No  Use of a cane, walker or w/c? Yes  Grab bars in the bathroom? Yes  Shower chair or bench in shower? Yes  Elevated toilet seat or a handicapped toilet? Yes   TIMED UP AND GO:  Was the test performed? No .  Length of time to ambulate 10 feet: 0 sec.   Gait steady and fast with assistive  device  Cognitive Function: MMSE - Mini Mental State Exam 11/01/2014  Not completed: (No Data)  Immunizations Immunization History  Administered Date(s) Administered  . Fluad Quad(high Dose 65+) 10/13/2018, 10/14/2019  . Influenza Split 01/27/2011, 12/10/2011  . Influenza Whole 10/29/2007, 10/24/2008, 10/09/2009  . Influenza, High Dose Seasonal PF 11/04/2012, 11/17/2014, 10/15/2016  . Influenza,inj,Quad PF,6+ Mos 11/15/2013  . Influenza-Unspecified 11/22/2015  . Moderna Sars-Covid-2 Vaccination 08/22/2019, 09/19/2019  . Pneumococcal Conjugate-13 09/14/2017  . Pneumococcal Polysaccharide-23 04/01/2011  . Tetanus 07/21/2007  . Zoster 02/03/2013    TDAP status: Due, Education has been provided regarding the importance of this vaccine. Advised may receive this vaccine at local pharmacy or Health Dept. Aware to provide a copy of the vaccination record if obtained from local pharmacy or Health Dept. Verbalized acceptance and understanding.  Flu Vaccine status: Up to date  Pneumococcal vaccine status: Up to date  Covid-19 vaccine status: Completed vaccines  Qualifies for Shingles Vaccine? Yes   Zostavax completed Yes   Shingrix Completed?: No.    Education has been provided regarding the importance of this vaccine. Patient has been advised to call insurance company to determine out of pocket expense if they have not yet received this vaccine. Advised may also receive vaccine at local pharmacy or Health Dept. Verbalized acceptance and understanding.  Screening Tests Health Maintenance  Topic Date Due  . COVID-19 Vaccine (3 - Booster for Moderna series) 03/19/2020  . TETANUS/TDAP  10/13/2020 (Originally 07/20/2017)  . DEXA SCAN  10/12/2028 (Originally 05/28/2013)  . INFLUENZA VACCINE  Completed  . PNA vac Low Risk Adult  Completed  . HPV VACCINES  Aged Out    Health Maintenance  Health Maintenance Due  Topic Date Due  . COVID-19 Vaccine (3 - Booster for Moderna series)  03/19/2020    Colorectal cancer screening: No longer required.   Mammogram status: Completed 10/10/2019. Repeat every year  ([facility])   Lung Cancer Screening: (Low Dose CT Chest recommended if Age 42-80 years, 30 pack-year currently smoking OR have quit w/in 15years.) does not qualify.   Lung Cancer Screening Referral: no  Additional Screening:  Hepatitis C Screening: does not qualify; Completed no  Vision Screening: Recommended annual ophthalmology exams for early detection of glaucoma and other disorders of the eye. Is the patient up to date with their annual eye exam?  Yes  Who is the provider or what is the name of the office in which the patient attends annual eye exams? Heather [place], OD. If pt is not established with a provider, would they like to be referred to a provider to establish care? No .   Dental Screening: Recommended annual dental exams for proper oral hygiene  Community Resource Referral / Chronic Care Management: CRR required this visit?  No   CCM required this visit?  No      Plan:     I have personally reviewed and noted the following in the patient's chart:   . Medical and social history . Use of alcohol, tobacco or illicit drugs  . Current medications and supplements . Functional ability and status . Nutritional status . Physical activity . Advanced directives . List of other physicians . Hospitalizations, surgeries, and ER visits in previous 12 months . Vitals . Screenings to include cognitive, depression, and falls . Referrals and appointments  In addition, I have reviewed and discussed with patient certain preventive protocols, quality metrics, and best practice recommendations. A written personalized care plan for preventive services as well as general preventive health recommendations were provided to patient.     Sheral Flow, LPN  04/12/2020   Nurse Notes:  Patient is cogitatively intact. There were no vitals  filed for this visit. There is no height or weight on file to calculate BMI. Medications reviewed with patient; no opioid use noted.

## 2020-10-15 DIAGNOSIS — Z01419 Encounter for gynecological examination (general) (routine) without abnormal findings: Secondary | ICD-10-CM | POA: Diagnosis not present

## 2020-10-15 DIAGNOSIS — Z1231 Encounter for screening mammogram for malignant neoplasm of breast: Secondary | ICD-10-CM | POA: Diagnosis not present

## 2020-10-15 DIAGNOSIS — Z6821 Body mass index (BMI) 21.0-21.9, adult: Secondary | ICD-10-CM | POA: Diagnosis not present

## 2020-10-15 NOTE — Progress Notes (Signed)
Subjective:    Patient ID: Olivia Evans, female    DOB: 1926-08-01, 87 y.o.   MRN: [id number]   This visit occurred during the SARS-CoV-2 public health emergency.  Safety protocols were in place, including screening questions prior to the visit, additional usage of staff PPE, and extensive cleaning of exam room while observing appropriate contact time as indicated for disinfecting solutions.    HPI She is here for a physical exam.   She denies change in her health. She gets tired easily.       Medications and allergies reviewed with patient and updated if appropriate.  Patient Active Problem List   Diagnosis Date Noted   Anemia 11/28/2018   Prediabetes 10/13/2018   Bilateral leg edema 09/12/2017   Bilateral primary osteoarthritis of knee 12/22/2016   Osteoporosis 03/26/2016   Diverticulosis of colon without hemorrhage 11/15/2013   Vitamin D deficiency 06/30/2012   INTERSTITIAL CYSTITIS 10/09/2009   Constipation 12/13/2007   History of colonic polyps 12/13/2007   SYNCOPE 06/24/2006    Current Outpatient Medications on File Prior to Visit  Medication Sig Dispense Refill   BIOTIN PO Take 1 capsule by mouth daily.     Cholecalciferol (VITAMIN D3) 1000 UNITS CAPS Take 2 capsules by mouth daily.     docusate sodium (COLACE) 100 MG capsule Take 3 capsules (300 mg total) by mouth daily. 10 capsule 0   Glucos-Chondroit-Collag-Hyal (JOINT SUPPORT FORMULA PO) Take by mouth.     hydrochlorothiazide (MICROZIDE) 12.5 MG capsule TAKE 1 CAPSULE (12.5 MG TOTAL) BY MOUTH DAILY WHEN FEET SWELL 30 capsule 1   Polyethyl Glycol-Propyl Glycol (SYSTANE OP) Apply 1 drop to eye daily. To both eyes.     Pumpkin Seed-Soy Germ (AZO BLADDER CONTROL/GO-LESS) CAPS Take 1 capsule by mouth daily.     No current facility-administered medications on file prior to visit.    Past Medical History:  Diagnosis Date   Arthritis KNEES   Bladder pain    Chronic constipation    Diverticulosis 2013   Dr  Fuller Plan   Dyslipidemia    Heart murmur    Hemorrhoids    History of adenomatous polyp of colon    2003 -- VILLOUS   History of breast cancer 2001---RIGHT BREAST CANCER   S/P PARTIAL MASTECTOMY AND RADIATION--  NO RECURRENCE   History of palpitations    Incomplete right bundle branch block (RBBB)    Interstitial cystitis    Dr McDiarmid   Lower urinary tract symptoms (LUTS)    Normal cardiac stress test    2000  PER MD NOTE   Osteopenia    Wears dentures    full -upper/  partial lower   Wears glasses     Past Surgical History:  Procedure Laterality Date   BENIGN TUMOR REMOVED  1960'S   SECOND RIB   CATARACT EXTRACTION W/ INTRAOCULAR LENS  IMPLANT, BILATERAL  feb 2016   CYSTO WITH HYDRODISTENSION  07/29/2011   Procedure: CYSTOSCOPY/HYDRODISTENSION;  Surgeon: Reece Packer, MD;  [facility];  Service: Urology;  Laterality: N/A;  with fulguration of bladder ulcer   CYSTO WITH HYDRODISTENSION N/A 08/28/2014   Procedure: CYSTO/HYDRODISTENSION, FULGURATION OF ULCERS;  Surgeon: Bjorn Loser, MD;  Location: [facility];  Service: Urology;  Laterality: N/A;   CYSTO/  HYDRODISTENTION/  INSTILLATION THERAPY  x2  01-18- and 11-15- 2011   CYSTO/ HOD/  BLADDER BX'S/  FULGERATION HUNNER ULCER'S /  INSTILLATION THERAPY  10-01-2010  PARTIAL MASTECTOMY W/ SLN DISSECTION Right 12-19-1999   ROTATOR CUFF REPAIR     TUBAL LIGATION     VAGINAL HYSTERECTOMY  1970'S   WITH APPENDECTOMY   VARICOSE VEIN SURGERY      Social History   Socioeconomic History   Marital status: Widowed    Spouse name: Not on file   Number of children: 4   Years of education: Not on file   Highest education level: Not on file  Occupational History   Occupation: Retired  Tobacco Use   Smoking status: Never   Smokeless tobacco: Never  Substance and Sexual Activity   Alcohol use: No    Alcohol/week: 0.0 standard drinks   Drug use: No   Sexual activity: Not on file   Other Topics Concern   Not on file  Social History Narrative   Not on file   Social Determinants of Health   Financial Resource Strain: Low Risk    Difficulty of Paying Living Expenses: Not hard at all  Food Insecurity: No Food Insecurity   Worried About Charity fundraiser in the Last Year: Never true   [place] in the Last Year: Never true  Transportation Needs: No Transportation Needs   Lack of Transportation (Medical): No   Lack of Transportation (Non-Medical): No  Physical Activity: Sufficiently Active   Days of Exercise per Week: 5 days   Minutes of Exercise per Session: 30 min  Stress: No Stress Concern Present   Feeling of Stress : Not at all  Social Connections: Moderately Integrated   Frequency of Communication with Friends and Family: More than three times a week   Frequency of Social Gatherings with Friends and Family: More than three times a week   Attends Religious Services: More than 4 times per year   Active Member of Clubs or Organizations: No   Attends Music therapist: More than 4 times per year   Marital Status: Widowed    Family History  Problem Relation Age of Onset   Heart disease Mother         CHF; no MI   Breast cancer Sister        S/P bilateral mastectomy   Diabetes Maternal Grandmother    Thyroid disease Other        3 daughters   Stroke Brother 49   Colon cancer Neg Hx    Esophageal cancer Neg Hx    Stomach cancer Neg Hx    Rectal cancer Neg Hx     Review of Systems  Constitutional:  Negative for chills and fever.  HENT:  Positive for sneezing (allergies).   Eyes:  Positive for visual disturbance (occ double vision - sees eye doctor).  Respiratory:  Positive for cough (allergies) and shortness of breath (chronic - no change - with exertion). Negative for wheezing.   Cardiovascular:  Positive for leg swelling. Negative for chest pain and palpitations.  Gastrointestinal:  Positive for constipation. Negative for  abdominal pain, blood in stool (no melena), diarrhea and nausea.       No gerd  Genitourinary:  Negative for dysuria and hematuria.  Musculoskeletal:  Positive for arthralgias (b/l knees).  Skin:  Negative for color change and rash.  Neurological:  Negative for light-headedness and headaches.  Psychiatric/Behavioral:  Negative for dysphoric mood. The patient is not nervous/anxious.       Objective:   Vitals:   10/16/20 1406  BP: 126/74  Pulse: 80  Temp: 98.3 F (  36.8 C)  SpO2: 97%   Filed Weights   10/16/20 1406  Weight: 131 lb (59.4 kg)   Body mass index is 21.8 kg/m.  BP Readings from Last 3 Encounters:  10/16/20 126/74  10/14/19 (!) 146/72  10/13/18 140/62    Wt Readings from Last 3 Encounters:  10/16/20 131 lb (59.4 kg)  10/14/19 131 lb 6.4 oz (59.6 kg)  05/18/19 131 lb (59.4 kg)     Physical Exam Constitutional: She appears well-developed and well-nourished. No distress.  HENT:  Head: Normocephalic and atraumatic.  Right Ear: External ear normal. Normal ear canal and TM Left Ear: External ear normal.  Normal ear canal and TM Mouth/Throat: Oropharynx is clear and moist.  Eyes: Conjunctivae and EOM are normal.  Neck: Neck supple. No tracheal deviation present. No thyromegaly present.  No carotid bruit  Cardiovascular: Normal rate, regular rhythm and normal heart sounds.   2/6 systolic murmur heard.  RLE mld edema, trace LLE  edema. Pulmonary/Chest: Effort normal and breath sounds normal. No respiratory distress. She has no wheezes. She has no rales.  Breast: deferred   Abdominal: Soft. She exhibits no distension. There is no tenderness.  Lymphadenopathy: She has no cervical adenopathy.  Skin: Skin is warm and dry. She is not diaphoretic.  Psychiatric: She has a normal mood and affect. Her behavior is normal.     Lab Results  Component Value Date   WBC 3.2 (L) 10/14/2019   HGB 10.6 (L) 10/14/2019   HCT 30.8 (L) 10/14/2019   PLT 248 10/14/2019    GLUCOSE 92 10/14/2019   CHOL 192 10/14/2019   TRIG 77 10/14/2019   HDL 74 10/14/2019   LDLDIRECT 104.5 04/01/2011   LDLCALC 101 (H) 10/14/2019   ALT 13 10/14/2019   AST 17 10/14/2019   NA 141 10/14/2019   K 4.3 10/14/2019   CL 102 10/14/2019   CREATININE 0.63 10/14/2019   BUN 13 10/14/2019   CO2 33 (H) 10/14/2019   TSH 1.57 10/14/2019   HGBA1C 5.5 10/14/2019         Assessment & Plan:   Physical exam: Screening blood work  ordered Exercise  tries to stay active Weight  normal Substance abuse  none   Reviewed recommended immunizations.   Flu vaccine today   Health Maintenance  Topic Date Due   INFLUENZA VACCINE  08/20/2020   COVID-19 Vaccine (3 - Booster for Moderna series) 11/01/2020 (Originally 02/19/2020)   Zoster Vaccines- Shingrix (1 of 2) 01/15/2021 (Originally 02/25/1978)   TETANUS/TDAP  10/16/2021 (Originally 07/20/2017)   DEXA SCAN  10/12/2028 (Originally 05/28/2013)   HPV VACCINES  Aged Out          See Problem List for Assessment and Plan of chronic medical problems.

## 2020-10-15 NOTE — Patient Instructions (Addendum)
Blood work was ordered.     Flu immunization administered today.     Medications changes include :   none    Please followup in 1 year   Health Maintenance, Female Adopting a healthy lifestyle and getting preventive care are important in promoting health and wellness. Ask your health care provider about: The right schedule for you to have regular tests and exams. Things you can do on your own to prevent diseases and keep yourself healthy. What should I know about diet, weight, and exercise? Eat a healthy diet  Eat a diet that includes plenty of vegetables, fruits, low-fat dairy products, and lean protein. Do not eat a lot of foods that are high in solid fats, added sugars, or sodium. Maintain a healthy weight Body mass index (BMI) is used to identify weight problems. It estimates body fat based on height and weight. Your health care provider can help determine your BMI and help you achieve or maintain a healthy weight. Get regular exercise Get regular exercise. This is one of the most important things you can do for your health. Most adults should: Exercise for at least 150 minutes each week. The exercise should increase your heart rate and make you sweat (moderate-intensity exercise). Do strengthening exercises at least twice a week. This is in addition to the moderate-intensity exercise. Spend less time sitting. Even light physical activity can be beneficial. Watch cholesterol and blood lipids Have your blood tested for lipids and cholesterol at 85 years of age, then have this test every 5 years. Have your cholesterol levels checked more often if: Your lipid or cholesterol levels are high. You are older than 85 years of age. You are at high risk for heart disease. What should I know about cancer screening? Depending on your health history and family history, you may need to have cancer screening at various ages. This may include screening for: Breast cancer. Cervical  cancer. Colorectal cancer. Skin cancer. Lung cancer. What should I know about heart disease, diabetes, and high blood pressure? Blood pressure and heart disease High blood pressure causes heart disease and increases the risk of stroke. This is more likely to develop in people who have high blood pressure readings, are of African descent, or are overweight. Have your blood pressure checked: Every 3-5 years if you are 66-47 years of age. Every year if you are 41 years old or older. Diabetes Have regular diabetes screenings. This checks your fasting blood sugar level. Have the screening done: Once every three years after age 69 if you are at a normal weight and have a low risk for diabetes. More often and at a younger age if you are overweight or have a high risk for diabetes. What should I know about preventing infection? Hepatitis B If you have a higher risk for hepatitis B, you should be screened for this virus. Talk with your health care provider to find out if you are at risk for hepatitis B infection. Hepatitis C Testing is recommended for: Everyone born from [birth year] through 1963. Anyone with known risk factors for hepatitis C. Sexually transmitted infections (STIs) Get screened for STIs, including gonorrhea and chlamydia, if: You are sexually active and are younger than 85 years of age. You are older than 85 years of age and your health care provider tells you that you are at risk for this type of infection. Your sexual activity has changed since you were last screened, and you are at increased risk for chlamydia or gonorrhea.  Ask your health care provider if you are at risk. Ask your health care provider about whether you are at high risk for HIV. Your health care provider may recommend a prescription medicine to help prevent HIV infection. If you choose to take medicine to prevent HIV, you should first get tested for HIV. You should then be tested every 3 months for as long as you are  taking the medicine. Pregnancy If you are about to stop having your period (premenopausal) and you may become pregnant, seek counseling before you get pregnant. Take 400 to 800 micrograms (mcg) of folic acid every day if you become pregnant. Ask for birth control (contraception) if you want to prevent pregnancy. Osteoporosis and menopause Osteoporosis is a disease in which the bones lose minerals and strength with aging. This can result in bone fractures. If you are 53 years old or older, or if you are at risk for osteoporosis and fractures, ask your health care provider if you should: Be screened for bone loss. Take a calcium or vitamin D supplement to lower your risk of fractures. Be given hormone replacement therapy (HRT) to treat symptoms of menopause. Follow these instructions at home: Lifestyle Do not use any products that contain nicotine or tobacco, such as cigarettes, e-cigarettes, and chewing tobacco. If you need help quitting, ask your health care provider. Do not use street drugs. Do not share needles. Ask your health care provider for help if you need support or information about quitting drugs. Alcohol use Do not drink alcohol if: Your health care provider tells you not to drink. You are pregnant, may be pregnant, or are planning to become pregnant. If you drink alcohol: Limit how much you use to 0-1 drink a day. Limit intake if you are breastfeeding. Be aware of how much alcohol is in your drink. In the [place] one drink equals one 12 oz bottle of beer (355 mL), one 5 oz glass of wine (148 mL), or one 1 oz glass of hard liquor (44 mL). General instructions Schedule regular health, dental, and eye exams. Stay current with your vaccines. Tell your health care provider if: You often feel depressed. You have ever been abused or do not feel safe at home. Summary Adopting a healthy lifestyle and getting preventive care are important in promoting health and wellness. Follow  your health care provider's instructions about healthy diet, exercising, and getting tested or screened for diseases. Follow your health care provider's instructions on monitoring your cholesterol and blood pressure. This information is not intended to replace advice given to you by your health care provider. Make sure you discuss any questions you have with your health care provider. Document Revised: 03/16/2020 Document Reviewed: 12/30/2017 Elsevier Patient Education  2022 [employer].

## 2020-10-16 ENCOUNTER — Ambulatory Visit (INDEPENDENT_AMBULATORY_CARE_PROVIDER_SITE_OTHER): Payer: PPO | Admitting: Internal Medicine

## 2020-10-16 ENCOUNTER — Encounter: Payer: Self-pay | Admitting: Internal Medicine

## 2020-10-16 ENCOUNTER — Other Ambulatory Visit: Payer: Self-pay

## 2020-10-16 VITALS — BP 126/74 | HR 80 | Temp 98.3°F | Ht 65.0 in | Wt 131.0 lb

## 2020-10-16 DIAGNOSIS — D649 Anemia, unspecified: Secondary | ICD-10-CM

## 2020-10-16 DIAGNOSIS — Z23 Encounter for immunization: Secondary | ICD-10-CM

## 2020-10-16 DIAGNOSIS — R7303 Prediabetes: Secondary | ICD-10-CM | POA: Diagnosis not present

## 2020-10-16 DIAGNOSIS — Z853 Personal history of malignant neoplasm of breast: Secondary | ICD-10-CM

## 2020-10-16 DIAGNOSIS — E559 Vitamin D deficiency, unspecified: Secondary | ICD-10-CM | POA: Diagnosis not present

## 2020-10-16 DIAGNOSIS — R600 Localized edema: Secondary | ICD-10-CM | POA: Diagnosis not present

## 2020-10-16 DIAGNOSIS — R0602 Shortness of breath: Secondary | ICD-10-CM | POA: Diagnosis not present

## 2020-10-16 DIAGNOSIS — M810 Age-related osteoporosis without current pathological fracture: Secondary | ICD-10-CM

## 2020-10-16 DIAGNOSIS — Z0000 Encounter for general adult medical examination without abnormal findings: Secondary | ICD-10-CM | POA: Diagnosis not present

## 2020-10-16 DIAGNOSIS — D72819 Decreased white blood cell count, unspecified: Secondary | ICD-10-CM

## 2020-10-16 LAB — COMPREHENSIVE METABOLIC PANEL
ALT: 13 U/L (ref 0–35)
AST: 17 U/L (ref 0–37)
Albumin: 4.1 g/dL (ref 3.5–5.2)
Alkaline Phosphatase: 57 U/L (ref 39–117)
BUN: 15 mg/dL (ref 6–23)
CO2: 31 mEq/L (ref 19–32)
Calcium: 9.9 mg/dL (ref 8.4–10.5)
Chloride: 104 mEq/L (ref 96–112)
Creatinine, Ser: 0.61 mg/dL (ref 0.40–1.20)
GFR: 77.49 mL/min (ref >60.00)
Glucose, Bld: 93 mg/dL (ref 70–99)
Potassium: 4.2 mEq/L (ref 3.5–5.1)
Sodium: 140 mEq/L (ref 135–145)
Total Bilirubin: 0.5 mg/dL (ref 0.2–1.2)
Total Protein: 6.1 g/dL (ref 6.0–8.3)

## 2020-10-16 LAB — CBC WITH DIFFERENTIAL/PLATELET
Basophils Absolute: 0.1 10*3/uL (ref 0.0–0.1)
Basophils Relative: 2.8 % (ref 0.0–3.0)
Eosinophils Absolute: 0.2 10*3/uL (ref 0.0–0.7)
Eosinophils Relative: 5.4 % — ABNORMAL HIGH (ref 0.0–5.0)
HCT: 27.4 % — ABNORMAL LOW (ref 36.0–46.0)
Hemoglobin: 9.2 g/dL — ABNORMAL LOW (ref 12.0–15.0)
Lymphocytes Relative: 32.8 % (ref 12.0–46.0)
Lymphs Abs: 1 10*3/uL (ref 0.7–4.0)
MCHC: 33.4 g/dL (ref 30.0–36.0)
MCV: 109.2 fl — ABNORMAL HIGH (ref 78.0–100.0)
Monocytes Absolute: 0.2 10*3/uL (ref 0.1–1.0)
Monocytes Relative: 6.5 % (ref 3.0–12.0)
Neutro Abs: 1.5 10*3/uL (ref 1.4–7.7)
Neutrophils Relative %: 52.5 % (ref 43.0–77.0)
Platelets: 243 10*3/uL (ref 150.0–400.0)
RBC: 2.51 Mil/uL — ABNORMAL LOW (ref 3.87–5.11)
RDW: 14.4 % (ref 11.5–15.5)
WBC: 2.9 10*3/uL — ABNORMAL LOW (ref 4.0–10.5)

## 2020-10-16 LAB — HEMOGLOBIN A1C: Hgb A1c MFr Bld: 5.8 % (ref 4.6–6.5)

## 2020-10-16 LAB — LIPID PANEL
Cholesterol: 161 mg/dL (ref 0–200)
HDL: 64.6 mg/dL (ref >39.00)
LDL Cholesterol: 84 mg/dL (ref 0–99)
NonHDL: 96.36
Total CHOL/HDL Ratio: 2
Triglycerides: 63 mg/dL (ref 0.0–149.0)
VLDL: 12.6 mg/dL (ref 0.0–40.0)

## 2020-10-16 LAB — VITAMIN D 25 HYDROXY (VIT D DEFICIENCY, FRACTURES): VITD: 99.87 ng/mL (ref 30.00–100.00)

## 2020-10-16 LAB — TSH: TSH: 0.81 u[IU]/mL (ref 0.35–5.50)

## 2020-10-16 NOTE — Addendum Note (Signed)
Addended by: Jacobo Forest on: 10/16/2020 02:49 PM   Modules accepted: Orders

## 2020-10-16 NOTE — Assessment & Plan Note (Signed)
Chronic intermittent Continue hctz 12.5 mg daily prn

## 2020-10-16 NOTE — Assessment & Plan Note (Signed)
Chronic She denies that it is worse  Defers additional testing at this time - thinks it is related to not getting enough exercise Will check labs - ? Anemia contributing

## 2020-10-16 NOTE — Assessment & Plan Note (Signed)
Chronic Taking vitamin D daily Check vitamin D level  

## 2020-10-16 NOTE — Assessment & Plan Note (Signed)
Chronic Check a1c Low sugar / carb diet Stressed regular exercise  

## 2020-10-16 NOTE — Assessment & Plan Note (Addendum)
Chronic H/o iron def No evidence of bleeding Cbc, iron panel

## 2020-10-16 NOTE — Addendum Note (Signed)
Addended by: Marcina Millard on: 10/16/2020 02:56 PM   Modules accepted: Orders

## 2020-10-16 NOTE — Assessment & Plan Note (Signed)
Had mammogram yesterday via gyn

## 2020-10-16 NOTE — Assessment & Plan Note (Addendum)
Chronic Defers dexa Not able to take calcium due to constipation Taking vitamin d - will check level

## 2020-10-17 LAB — IRON,TIBC AND FERRITIN PANEL
%SAT: 61 % (calc) — ABNORMAL HIGH (ref 16–45)
Ferritin: 222 ng/mL (ref 16–288)
Iron: 140 ug/dL (ref 45–160)
TIBC: 230 mcg/dL (calc) — ABNORMAL LOW (ref 250–450)

## 2020-10-18 ENCOUNTER — Telehealth: Payer: Self-pay | Admitting: Physician Assistant

## 2020-10-18 NOTE — Addendum Note (Signed)
Addended by: [name] on: 10/18/2020 07:46 AM   Modules accepted: Orders

## 2020-10-18 NOTE — Telephone Encounter (Signed)
Scheduled appt per 9/29 referral. PT is aware of appt date and time.

## 2020-10-22 NOTE — Progress Notes (Signed)
[place] [phone]   [phone]  CONSULT NOTE  REFERRING PHYSICIAN: Dr. Quay Burow  REASON FOR CONSULTATION:  anemia  HPI Olivia Evans is a 87 y.o. female with a past medical history significant for right-sided breast cancer diagnosed in early 2000's s/p lumpectomy and adjuvant radiation , osteoporosis, interstitial cystitis, vitamin D deficiency, hysterectomy due to menorrhagia, and osteoarthritis is referred to clinic for evaluation of anemia.   The patient had a routine follow-up visit with her primary care provider on 10/16/2020.  The patient had blood work performed that day which showed bicytopenia with a low white blood cell count of 2.9, low hemoglobin at 9.2, macrocytosis with MCV of 109.2, and a normal platelet count.  She had iron studies performed that day which showed a normal total iron of 140, TIBC slightly low at 230, and an elevated saturation ratio at 61%.  She was referred to clinic for further evaluation regarding these findings.  Per chart review, and the oldest records available to me are from 2013.  She started showing evidence of mild anemia in 2018.  She also start developing mild leukopenia at this time as well.  To the patient's knowledge, she is not sure how long she has been anemic. She is not taking any vitamin supplements. Overall, she is symptomatic and notes associated dyspnea on exertion/decreased exercise tolerance. She often needs to rest while doing activities of daily living such as washing dishes or getting the mail. She lives at home independently.    Her gastroenterologist is Dr. Fuller Plan and her last colonoscopy was in 2013. She had diverticulosis. No abnormal polyps were seen. No follow up colonoscopy was recommended due to age and absence of abnormal findings. She reports easy extremity bruising if she bumps her self. The patient denies any particular dietary habits such as being a vegan or vegetarian, but she does  not eat a lot of meat due to preferences. She eats a lot of vegetables. She denies any history of bariatric surgery.  She denies any history of kidney or liver disease.  She denies any blood thinner use or aspirin.  She denies any abnormal bleeding or bruising including epistaxis, gingival bleeding, hemoptysis, hematemesis, or melena. She has had constipation for her entire life per patient report. She states her stools are a brown without any evidence of blood. She denies chest pain, palpitations, fever, chills, night sweats, or unexplained weight loss. Denies eating ice chips.   She notes for the last year or so, she has a sensation of her bones "stretching" in her spine when she moves/twists a certain way. The pain is fleeting and resolves without intervention after few seconds. Denies falls or injuries. Denies worsening in her symptoms over the last year, just intermittent pain. She sees Dr. Durward Fortes from orthopedics. She is wondering if her anemia is related to her back pain.   She denies any family history of any colorectal cancer, anemia, or bone marrow disorders.  Her mother had heart problems, frequent bronchitis, and constipation.  Her siblings passed away secondary to strokes, motor vehicle accidents, and Alzheimer's.  The patient is widowed and has 4 children.  She denies any alcohol, drug, or cigarette use.  She used to work in a [employer].   HPI  Past Medical History:  Diagnosis Date   Arthritis KNEES   Bladder pain    Chronic constipation    Diverticulosis 2013   Dr Fuller Plan   Dyslipidemia    Heart  murmur    Hemorrhoids    History of adenomatous polyp of colon    2003 -- VILLOUS   History of breast cancer 2001---RIGHT BREAST CANCER   S/P PARTIAL MASTECTOMY AND RADIATION--  NO RECURRENCE   History of palpitations    Incomplete right bundle branch block (RBBB)    Interstitial cystitis    Dr McDiarmid   Lower urinary tract symptoms (LUTS)    Normal cardiac stress  test    2000  PER MD NOTE   Osteopenia    Wears dentures    full -upper/  partial lower   Wears glasses     Past Surgical History:  Procedure Laterality Date   BENIGN TUMOR REMOVED  1960'S   SECOND RIB   CATARACT EXTRACTION W/ INTRAOCULAR LENS  IMPLANT, BILATERAL  feb 2016   CYSTO WITH HYDRODISTENSION  07/29/2011   Procedure: CYSTOSCOPY/HYDRODISTENSION;  Surgeon: Reece Packer, MD;  Location: [facility];  Service: Urology;  Laterality: N/A;  with fulguration of bladder ulcer   CYSTO WITH HYDRODISTENSION N/A 08/28/2014   Procedure: CYSTO/HYDRODISTENSION, FULGURATION OF ULCERS;  Surgeon: Bjorn Loser, MD;  Location: [place];  Service: Urology;  Laterality: N/A;   CYSTO/  HYDRODISTENTION/  INSTILLATION THERAPY  x2  01-18- and 11-15- 2011   CYSTO/ HOD/  BLADDER BX'S/  FULGERATION HUNNER ULCER'S /  INSTILLATION THERAPY  10-01-2010   PARTIAL MASTECTOMY W/ SLN DISSECTION Right 12-19-1999   ROTATOR CUFF REPAIR     TUBAL LIGATION     VAGINAL HYSTERECTOMY  1970'S   WITH APPENDECTOMY   VARICOSE VEIN SURGERY      Family History  Problem Relation Age of Onset   Heart disease Mother         CHF; no MI   Breast cancer Sister        S/P bilateral mastectomy   Diabetes Maternal Grandmother    Thyroid disease Other        3 daughters   Stroke Brother 71   Colon cancer Neg Hx    Esophageal cancer Neg Hx    Stomach cancer Neg Hx    Rectal cancer Neg Hx     Social History Social History   Tobacco Use   Smoking status: Never   Smokeless tobacco: Never  Substance Use Topics   Alcohol use: No    Alcohol/week: 0.0 standard drinks   Drug use: No    Allergies  Allergen Reactions   Premarin [Conjugated Estrogens] Other (See Comments)    TABS--  CAUSE PHLEBITIS   Vicodin [Hydrocodone-Acetaminophen] Itching    SEVERE   Adhesive [Tape] Other (See Comments)    IRRITATION   Fluvastatin Sodium Other (See Comments)    HEART PALPITATIONS    Simvastatin Other (See Comments)    SEVERE LEG ACHES    Current Outpatient Medications  Medication Sig Dispense Refill   BIOTIN PO Take 1 capsule by mouth daily.     Cholecalciferol (VITAMIN D3) 1000 UNITS CAPS Take 4 capsules by mouth daily.     hydrochlorothiazide (MICROZIDE) 12.5 MG capsule TAKE 1 CAPSULE (12.5 MG TOTAL) BY MOUTH DAILY WHEN FEET SWELL 30 capsule 1   Multiple Vitamins-Minerals (PRESERVISION AREDS 2 PO) Take 2 capsules by mouth daily.     Polyethyl Glycol-Propyl Glycol (SYSTANE OP) Apply 1 drop to eye daily. To both eyes.     Pumpkin Seed-Soy Germ (AZO BLADDER CONTROL/GO-LESS) CAPS Take 1 capsule by mouth daily.     docusate sodium (COLACE) 100  MG capsule Take 3 capsules (300 mg total) by mouth daily. (Patient not taking: Reported on 10/23/2020) 10 capsule 0   No current facility-administered medications for this visit.    REVIEW OF SYSTEMS:   Review of Systems  Constitutional: Positive for fatigue and decreased activity tolerance.  Negative for appetite change, chills, fever and unexpected weight change.  HENT: Negative for mouth sores, nosebleeds, sore throat and trouble swallowing.   Eyes: Negative for eye problems and icterus.  Respiratory: Positive for dyspnea on exertion.  Negative for cough, hemoptysis, and wheezing.   Cardiovascular: Negative for chest pain and leg swelling.  Gastrointestinal: Positive for chronic constipation.  Negative for abdominal pain, diarrhea, nausea and vomiting.  Genitourinary: Negative for bladder incontinence, difficulty urinating, dysuria, frequency and hematuria.   Musculoskeletal: Positive for intermittent back pain.  Negative for gait problem, neck pain and neck stiffness.  Skin: Negative for itching and rash.  Neurological: Negative for dizziness, extremity weakness, gait problem, headaches, light-headedness and seizures.  Hematological: Negative for adenopathy. Does not bruise/bleed easily.  Psychiatric/Behavioral: Negative for  confusion, depression and sleep disturbance. The patient is not nervous/anxious.     PHYSICAL EXAMINATION:  Blood pressure (!) 155/67, pulse 82, temperature 97.6 F (36.4 C), temperature source Tympanic, resp. rate 19, height 5' 5"  (1.651 m), weight 129 lb 3.2 oz (58.6 kg), SpO2 96 %.  ECOG PERFORMANCE STATUS: 1  Physical Exam  Constitutional: Oriented to person, place, and time and thin appearing female, and in no distress.  HENT:  Head: Normocephalic and atraumatic.  Mouth/Throat: Oropharynx is clear and moist. No oropharyngeal exudate.  Eyes: Conjunctivae are normal. Right eye exhibits no discharge. Left eye exhibits no discharge. No scleral icterus.  Neck: Normal range of motion. Neck supple.  Cardiovascular: Normal rate, regular rhythm, normal heart sounds and intact distal pulses.   Pulmonary/Chest: Effort normal and breath sounds normal. No respiratory distress. No wheezes. No rales.  Abdominal: Soft. Bowel sounds are normal. Exhibits no distension and no mass. There is no tenderness.  Musculoskeletal: Normal range of motion. Exhibits no edema.  Lymphadenopathy:    No cervical adenopathy.  Neurological: Alert and oriented to person, place, and time.  Exhibits muscle wasting. gait normal. Coordination normal.  Skin: Skin is warm and dry. No rash noted. Not diaphoretic. No erythema. No pallor.  Psychiatric: Mood, memory and judgment normal.  Vitals reviewed.  LABORATORY DATA: Lab Results  Component Value Date   WBC 3.0 (L) 10/23/2020   HGB 9.6 (L) 10/23/2020   HCT 28.7 (L) 10/23/2020   MCV 110.0 (H) 10/23/2020   PLT 244 10/23/2020      Chemistry      Component Value Date/Time   NA 142 10/23/2020 1338   K 4.2 10/23/2020 1338   CL 104 10/23/2020 1338   CO2 28 10/23/2020 1338   BUN 13 10/23/2020 1338   CREATININE 0.77 10/23/2020 1338   CREATININE 0.63 10/14/2019 1501      Component Value Date/Time   CALCIUM 10.1 10/23/2020 1338   ALKPHOS 64 10/23/2020 1338   AST  15 10/23/2020 1338   ALT 13 10/23/2020 1338   BILITOT 0.6 10/23/2020 1338       RADIOGRAPHIC STUDIES: No results found.  ASSESSMENT: This is a very pleasant 87 year old female referred to the clinic for macrocytic anemia.   PLAN: The patient was seen with Dr. Julien Nordmann today.  Patient had several lab studies performed including a CBC, CMP, LDH, reticulocyte count,  SPEP with immunofixation, vitamin P59, folic acid,  and stool cards.  Her labs from today demonstrated macrocytic anemia with a hemoglobin of 9.6 and an MCV elevated at 110.  Her white blood cell count is low at 3.0 and her ANC is slightly low at 1.4.  Her platelet count is within normal limits, her CMP is unremarkable.  Her vitamin S50 and folic acid are also normal.  Her reticulocyte count is normal and her LDH is also within normal limits.  Given the bicytopenia, macrocytosis in the absence of N39 and folic acid deficiency Dr. Julien Nordmann recommends that we arrange for a bone marrow biopsy and aspirate to rule out bone marrow pathology such as myelodysplastic syndrome.  We will see the patient back for follow-up visit approximately 1 week after her bone marrow biopsy and aspirate to review the results.  The patient was advised to have someone drive her to her appointment as the we will give her sedation for the procedure.  Regarding the patient's intermittent back pain with certain positions and history degenerative disc disease and osteoarthritis, she is already established with an orthopedic physician or PCP.  I encouraged her to follow-up with them for further evaluation.  Her pain has been present for approximately 1 year and is stable.   The patient voices understanding of current disease status and treatment options and is in agreement with the current care plan.  All questions were answered. The patient knows to call the clinic with any problems, questions or concerns. We can certainly see the patient much sooner if  necessary.  Thank you so much for allowing me to participate in the care of Olivia Evans. I will continue to follow up the patient with you and assist in her care.   Disclaimer: This note was dictated with voice recognition software. Similar sounding words can inadvertently be transcribed and may not be corrected upon review.   Ronnisha Felber L Mikiah Demond October 23, 2020, 3:23 PM  ADDENDUM: Hematology/Oncology Attending: I had a face-to-face encounter with the patient today.  I reviewed her records, lab and recommended her care plan.  This is a very pleasant 87 years old white female with history of right breast cancer in early 2000 status postlumpectomy with adjuvant radiation.  She also has a history of interstitial cystitis, vitamin D deficiency, hysterectomy secondary to menorrhagia as well as osteoporosis.  The patient was seen recently by her primary care physician for routine evaluation and CBC performed at that time showed persistent anemia with hemoglobin down to 9.2, hematocrit 27.4 with MCV of 109.2 she had iron study and ferritin that were within the normal range.  She has normal TSH. Reviewing her records the patient had anemia and leukocytopenia for several years. We did order several studies today for evaluation of her anemia including vitamin B12 and serum folate that were normal.  She has normal LDH and normal reticulocyte count. Serum protein electrophoresis is still pending. I had a lengthy discussion with the patient today about her condition. 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  I recommended for the patient to proceed with a bone marrow biopsy and aspirate to rule out any underlying bone marrow abnormalities. She will continue with supportive care for now until the results of the bone marrow becomes available and at that time we will discuss with her other treatment options for her condition. The patient is in agreement with  the current plan. She was advised to call immediately if she has any other concerning symptoms in the  interval.  The total time spent in the appointment was 60 minutes. Disclaimer: This note was dictated with voice recognition software. Similar sounding words can inadvertently be transcribed and may be missed upon review. Eilleen Kempf, MD 10/23/20

## 2020-10-23 ENCOUNTER — Telehealth: Payer: Self-pay | Admitting: Physician Assistant

## 2020-10-23 ENCOUNTER — Other Ambulatory Visit: Payer: Self-pay

## 2020-10-23 ENCOUNTER — Encounter: Payer: Self-pay | Admitting: Physician Assistant

## 2020-10-23 ENCOUNTER — Inpatient Hospital Stay: Payer: PPO

## 2020-10-23 ENCOUNTER — Inpatient Hospital Stay: Payer: PPO | Attending: Physician Assistant | Admitting: Physician Assistant

## 2020-10-23 ENCOUNTER — Other Ambulatory Visit: Payer: Self-pay | Admitting: Physician Assistant

## 2020-10-23 VITALS — BP 155/67 | HR 82 | Temp 97.6°F | Resp 19 | Ht 65.0 in | Wt 129.2 lb

## 2020-10-23 DIAGNOSIS — D759 Disease of blood and blood-forming organs, unspecified: Secondary | ICD-10-CM

## 2020-10-23 DIAGNOSIS — Z853 Personal history of malignant neoplasm of breast: Secondary | ICD-10-CM | POA: Diagnosis not present

## 2020-10-23 DIAGNOSIS — Z923 Personal history of irradiation: Secondary | ICD-10-CM | POA: Diagnosis not present

## 2020-10-23 DIAGNOSIS — E559 Vitamin D deficiency, unspecified: Secondary | ICD-10-CM | POA: Diagnosis not present

## 2020-10-23 DIAGNOSIS — M810 Age-related osteoporosis without current pathological fracture: Secondary | ICD-10-CM | POA: Diagnosis not present

## 2020-10-23 DIAGNOSIS — D649 Anemia, unspecified: Secondary | ICD-10-CM

## 2020-10-23 DIAGNOSIS — D539 Nutritional anemia, unspecified: Secondary | ICD-10-CM

## 2020-10-23 DIAGNOSIS — D7589 Other specified diseases of blood and blood-forming organs: Secondary | ICD-10-CM | POA: Diagnosis not present

## 2020-10-23 DIAGNOSIS — Z90710 Acquired absence of both cervix and uterus: Secondary | ICD-10-CM | POA: Diagnosis not present

## 2020-10-23 DIAGNOSIS — Z803 Family history of malignant neoplasm of breast: Secondary | ICD-10-CM

## 2020-10-23 LAB — CBC WITH DIFFERENTIAL (CANCER CENTER ONLY)
Abs Immature Granulocytes: 0.01 10*3/uL (ref 0.00–0.07)
Basophils Absolute: 0.1 10*3/uL (ref 0.0–0.1)
Basophils Relative: 2 %
Eosinophils Absolute: 0.2 10*3/uL (ref 0.0–0.5)
Eosinophils Relative: 6 %
HCT: 28.7 % — ABNORMAL LOW (ref 36.0–46.0)
Hemoglobin: 9.6 g/dL — ABNORMAL LOW (ref 12.0–15.0)
Immature Granulocytes: 0 %
Lymphocytes Relative: 35 %
Lymphs Abs: 1.1 10*3/uL (ref 0.7–4.0)
MCH: 36.8 pg — ABNORMAL HIGH (ref 26.0–34.0)
MCHC: 33.4 g/dL (ref 30.0–36.0)
MCV: 110 fL — ABNORMAL HIGH (ref 80.0–100.0)
Monocytes Absolute: 0.3 10*3/uL (ref 0.1–1.0)
Monocytes Relative: 8 %
Neutro Abs: 1.4 10*3/uL — ABNORMAL LOW (ref 1.7–7.7)
Neutrophils Relative %: 49 %
Platelet Count: 244 10*3/uL (ref 150–400)
RBC: 2.61 MIL/uL — ABNORMAL LOW (ref 3.87–5.11)
RDW: 13.4 % (ref 11.5–15.5)
WBC Count: 3 10*3/uL — ABNORMAL LOW (ref 4.0–10.5)
nRBC: 0 % (ref 0.0–0.2)

## 2020-10-23 LAB — RETICULOCYTES
Immature Retic Fract: 14.5 % (ref 2.3–15.9)
RBC.: 2.62 MIL/uL — ABNORMAL LOW (ref 3.87–5.11)
Retic Count, Absolute: 40.9 10*3/uL (ref 19.0–186.0)
Retic Ct Pct: 1.6 % (ref 0.4–3.1)

## 2020-10-23 LAB — CMP (CANCER CENTER ONLY)
ALT: 13 U/L (ref 0–44)
AST: 15 U/L (ref 15–41)
Albumin: 4.1 g/dL (ref 3.5–5.0)
Alkaline Phosphatase: 64 U/L (ref 38–126)
Anion gap: 10 (ref 5–15)
BUN: 13 mg/dL (ref 8–23)
CO2: 28 mmol/L (ref 22–32)
Calcium: 10.1 mg/dL (ref 8.9–10.3)
Chloride: 104 mmol/L (ref 98–111)
Creatinine: 0.77 mg/dL (ref 0.44–1.00)
GFR, Estimated: >60 mL/min (ref >60)
Glucose, Bld: 95 mg/dL (ref 70–99)
Potassium: 4.2 mmol/L (ref 3.5–5.1)
Sodium: 142 mmol/L (ref 135–145)
Total Bilirubin: 0.6 mg/dL (ref 0.3–1.2)
Total Protein: 6.4 g/dL — ABNORMAL LOW (ref 6.5–8.1)

## 2020-10-23 LAB — FOLATE: Folate: 14.3 ng/mL (ref >5.9)

## 2020-10-23 LAB — VITAMIN B12: Vitamin B-12: 411 pg/mL (ref 180–914)

## 2020-10-23 LAB — LACTATE DEHYDROGENASE: LDH: 186 U/L (ref 98–192)

## 2020-10-23 NOTE — Telephone Encounter (Signed)
I received the results of the vitamin D05 and folic acid.  Since her X83 and folic acid were within normal limits and we do not see clear etiology of her leukocytopenia and anemia, we will move forward with arranging for a bone marrow biopsy and aspirate as discussed at her appointment today.  I have placed the order.  I called the patient and left a detailed voicemail as she gave me permission during her appointment today to leave a detailed voicemail.  I have ordered a bone marrow biopsy and aspirate and we will see her back approximately 1 week after this procedure is done to review the results. If she has any questions, I left our call back number.

## 2020-10-25 LAB — PROTEIN ELECTROPHORESIS, SERUM, WITH REFLEX
A/G Ratio: 2.2 — ABNORMAL HIGH (ref 0.7–1.7)
Albumin ELP: 4.1 g/dL (ref 2.9–4.4)
Alpha-1-Globulin: 0.2 g/dL (ref 0.0–0.4)
Alpha-2-Globulin: 0.5 g/dL (ref 0.4–1.0)
Beta Globulin: 0.7 g/dL (ref 0.7–1.3)
Gamma Globulin: 0.6 g/dL (ref 0.4–1.8)
Globulin, Total: 1.9 g/dL — ABNORMAL LOW (ref 2.2–3.9)
Total Protein ELP: 6 g/dL (ref 6.0–8.5)

## 2020-11-09 ENCOUNTER — Telehealth: Payer: Self-pay | Admitting: Internal Medicine

## 2020-11-09 NOTE — Telephone Encounter (Signed)
Sch per 10/5 sch msg, pt aware

## 2020-11-12 ENCOUNTER — Telehealth: Payer: Self-pay

## 2020-11-12 ENCOUNTER — Encounter: Payer: Self-pay | Admitting: Internal Medicine

## 2020-11-12 NOTE — Telephone Encounter (Signed)
Pts daughter called with some questions/concerns pt has about the bone marrow bx. Pt wanted to know if it was safe to have given she has Osteoporosis.   Discussed with Cassandra PA-C who also confirmed with Dr. Julien Nordmann there is no contraindication with this bx and pt having osteoporosis.  I have shared with information with pts daughter and she expressed understanding of this information. She was encouraged to call back if she had any additional concerns.

## 2020-11-13 ENCOUNTER — Telehealth: Payer: Self-pay

## 2020-11-13 NOTE — Telephone Encounter (Signed)
-----   Message from [name] sent at 11/12/2020 10:14 AM EDT ----- Regarding: Care Questions Hello  This patient left a voicemail wanting to get in touch with you regarding her future care plan. She wants to know what to expect.  Olivia Evans

## 2020-11-13 NOTE — Telephone Encounter (Signed)
Opened in error.   I've already spoken with pts daughter regarding this concern. Please see details in separate telephone encounter.

## 2020-11-21 ENCOUNTER — Other Ambulatory Visit: Payer: Self-pay | Admitting: Radiology

## 2020-11-22 ENCOUNTER — Other Ambulatory Visit: Payer: Self-pay | Admitting: Radiology

## 2020-11-23 ENCOUNTER — Other Ambulatory Visit: Payer: Self-pay | Admitting: Physician Assistant

## 2020-11-23 ENCOUNTER — Encounter (HOSPITAL_COMMUNITY): Payer: Self-pay

## 2020-11-23 ENCOUNTER — Ambulatory Visit (HOSPITAL_COMMUNITY)
Admission: RE | Admit: 2020-11-23 | Discharge: 2020-11-23 | Disposition: A | Discharge status: Home / Self Care | Payer: PPO | Source: Ambulatory Visit | Attending: Physician Assistant | Admitting: Physician Assistant

## 2020-11-23 DIAGNOSIS — Z803 Family history of malignant neoplasm of breast: Secondary | ICD-10-CM | POA: Diagnosis not present

## 2020-11-23 DIAGNOSIS — D7589 Other specified diseases of blood and blood-forming organs: Secondary | ICD-10-CM | POA: Insufficient documentation

## 2020-11-23 DIAGNOSIS — Z885 Allergy status to narcotic agent status: Secondary | ICD-10-CM | POA: Insufficient documentation

## 2020-11-23 DIAGNOSIS — D72819 Decreased white blood cell count, unspecified: Secondary | ICD-10-CM | POA: Diagnosis not present

## 2020-11-23 DIAGNOSIS — Z923 Personal history of irradiation: Secondary | ICD-10-CM | POA: Diagnosis not present

## 2020-11-23 DIAGNOSIS — D649 Anemia, unspecified: Secondary | ICD-10-CM

## 2020-11-23 DIAGNOSIS — Z79899 Other long term (current) drug therapy: Secondary | ICD-10-CM | POA: Insufficient documentation

## 2020-11-23 DIAGNOSIS — M1990 Unspecified osteoarthritis, unspecified site: Secondary | ICD-10-CM | POA: Insufficient documentation

## 2020-11-23 DIAGNOSIS — Z888 Allergy status to other drugs, medicaments and biological substances status: Secondary | ICD-10-CM | POA: Insufficient documentation

## 2020-11-23 DIAGNOSIS — K5790 Diverticulosis of intestine, part unspecified, without perforation or abscess without bleeding: Secondary | ICD-10-CM | POA: Diagnosis not present

## 2020-11-23 DIAGNOSIS — Z853 Personal history of malignant neoplasm of breast: Secondary | ICD-10-CM | POA: Diagnosis not present

## 2020-11-23 DIAGNOSIS — E785 Hyperlipidemia, unspecified: Secondary | ICD-10-CM | POA: Insufficient documentation

## 2020-11-23 DIAGNOSIS — N3010 Interstitial cystitis (chronic) without hematuria: Secondary | ICD-10-CM | POA: Insufficient documentation

## 2020-11-23 DIAGNOSIS — D539 Nutritional anemia, unspecified: Secondary | ICD-10-CM | POA: Diagnosis not present

## 2020-11-23 LAB — CBC WITH DIFFERENTIAL/PLATELET
Abs Immature Granulocytes: 0.01 10*3/uL (ref 0.00–0.07)
Basophils Absolute: 0.1 10*3/uL (ref 0.0–0.1)
Basophils Relative: 2 %
Eosinophils Absolute: 0.2 10*3/uL (ref 0.0–0.5)
Eosinophils Relative: 6 %
HCT: 30.6 % — ABNORMAL LOW (ref 36.0–46.0)
Hemoglobin: 9.9 g/dL — ABNORMAL LOW (ref 12.0–15.0)
Immature Granulocytes: 0 %
Lymphocytes Relative: 39 %
Lymphs Abs: 1.3 10*3/uL (ref 0.7–4.0)
MCH: 36.9 pg — ABNORMAL HIGH (ref 26.0–34.0)
MCHC: 32.4 g/dL (ref 30.0–36.0)
MCV: 114.2 fL — ABNORMAL HIGH (ref 80.0–100.0)
Monocytes Absolute: 0.3 10*3/uL (ref 0.1–1.0)
Monocytes Relative: 8 %
Neutro Abs: 1.5 10*3/uL — ABNORMAL LOW (ref 1.7–7.7)
Neutrophils Relative %: 45 %
Platelets: 259 10*3/uL (ref 150–400)
RBC: 2.68 MIL/uL — ABNORMAL LOW (ref 3.87–5.11)
RDW: 13.7 % (ref 11.5–15.5)
WBC: 3.4 10*3/uL — ABNORMAL LOW (ref 4.0–10.5)
nRBC: 0 % (ref 0.0–0.2)

## 2020-11-23 MED ORDER — MIDAZOLAM HCL 2 MG/2ML IJ SOLN
INTRAMUSCULAR | Status: AC | PRN
  Administered 2020-11-23: .5 mg via INTRAVENOUS

## 2020-11-23 MED ORDER — FENTANYL CITRATE (PF) 100 MCG/2ML IJ SOLN
INTRAMUSCULAR | Status: AC | PRN
Start: 2020-11-23 — End: 2020-11-23
  Administered 2020-11-23: 25 ug via INTRAVENOUS

## 2020-11-23 MED ORDER — FENTANYL CITRATE (PF) 100 MCG/2ML IJ SOLN: INTRAMUSCULAR | Status: AC | PRN

## 2020-11-23 MED ORDER — LIDOCAINE HCL (PF) 1 % IJ SOLN
INTRAMUSCULAR | Status: AC | PRN
  Administered 2020-11-23: 10 mL via INTRADERMAL

## 2020-11-23 MED ORDER — FENTANYL CITRATE (PF) 100 MCG/2ML IJ SOLN
INTRAMUSCULAR | Status: AC
  Filled 2020-11-23: qty 2

## 2020-11-23 MED ORDER — FLUMAZENIL 0.5 MG/5ML IV SOLN
INTRAVENOUS | Status: AC
  Filled 2020-11-23: qty 5

## 2020-11-23 MED ORDER — FENTANYL CITRATE (PF) 100 MCG/2ML IJ SOLN
INTRAMUSCULAR | Status: DC | PRN
  Administered 2020-11-23: 25 ug via INTRAVENOUS

## 2020-11-23 MED ORDER — FENTANYL CITRATE (PF) 100 MCG/2ML IJ SOLN
INTRAMUSCULAR | Status: AC | PRN
  Administered 2020-11-23: 25 ug via INTRAVENOUS

## 2020-11-23 MED ORDER — NALOXONE HCL 0.4 MG/ML IJ SOLN
INTRAMUSCULAR | Status: AC
  Filled 2020-11-23: qty 1

## 2020-11-23 MED ORDER — SODIUM CHLORIDE 0.9 % IV SOLN: INTRAVENOUS | Status: DC

## 2020-11-23 MED ORDER — MIDAZOLAM HCL 2 MG/2ML IJ SOLN
INTRAMUSCULAR | Status: AC
  Filled 2020-11-23: qty 2

## 2020-11-23 NOTE — Procedures (Signed)
Interventional Radiology Procedure Note  Procedure: CT guided aspirate and core biopsy of right iliac bone Complications: None Recommendations: - Bedrest supine x 1 hrs - Hydrocodone PRN  Pain - Follow biopsy results  Signed,  Dontez Hauss K. Earleen Aoun, MD   

## 2020-11-23 NOTE — Consult Note (Signed)
Chief Complaint: Patient was seen in consultation today for CT-guided bone marrow biopsy  Referring Physician(s): Mohamed,M  Supervising Physician: Jacqulynn Cadet  Patient Status: [facility] - Out-pt  History of Present Illness: Olivia Evans is a 87 y.o. female with past medical history of arthritis, diverticulosis, hyperlipidemia, remote right breast cancer and interstitial cystitis who presents now with persistent macrocytic anemia and leukocytopenia of uncertain etiology.  She is scheduled today for CT-guided bone marrow biopsy to rule out myelodysplastic disorder.  Past Medical History:  Diagnosis Date   Arthritis KNEES   Bladder pain    Chronic constipation    Diverticulosis 2013   Dr Fuller Plan   Dyslipidemia    Heart murmur    Hemorrhoids    History of adenomatous polyp of colon    2003 -- VILLOUS   History of breast cancer 2001---RIGHT BREAST CANCER   S/P PARTIAL MASTECTOMY AND RADIATION--  NO RECURRENCE   History of palpitations    Incomplete right bundle branch block (RBBB)    Interstitial cystitis    Dr McDiarmid   Lower urinary tract symptoms (LUTS)    Normal cardiac stress test    2000  PER MD NOTE   Osteopenia    Wears dentures    full -upper/  partial lower   Wears glasses     Past Surgical History:  Procedure Laterality Date   BENIGN TUMOR REMOVED  1960'S   SECOND RIB   CATARACT EXTRACTION W/ INTRAOCULAR LENS  IMPLANT, BILATERAL  feb 2016   CYSTO WITH HYDRODISTENSION  07/29/2011   Procedure: CYSTOSCOPY/HYDRODISTENSION;  Surgeon: Reece Packer, MD;  Location: [facility];  Service: Urology;  Laterality: N/A;  with fulguration of bladder ulcer   CYSTO WITH HYDRODISTENSION N/A 08/28/2014   Procedure: CYSTO/HYDRODISTENSION, FULGURATION OF ULCERS;  Surgeon: Bjorn Loser, MD;  Location: [facility];  Service: Urology;  Laterality: N/A;   CYSTO/  HYDRODISTENTION/  INSTILLATION THERAPY  x2  01-18- and 11-15- 2011   CYSTO/  HOD/  BLADDER BX'S/  FULGERATION HUNNER ULCER'S /  INSTILLATION THERAPY  10-01-2010   PARTIAL MASTECTOMY W/ SLN DISSECTION Right 12-19-1999   ROTATOR CUFF REPAIR     TUBAL LIGATION     VAGINAL HYSTERECTOMY  1970'S   WITH APPENDECTOMY   VARICOSE VEIN SURGERY      Allergies: Premarin [conjugated estrogens], Vicodin [hydrocodone-acetaminophen], Adhesive [tape], Fluvastatin sodium, and Simvastatin  Medications: Prior to Admission medications   Medication Sig Start Date End Date Taking? Authorizing Provider  Cholecalciferol (VITAMIN D3) 1000 UNITS CAPS Take 4,000 Units by mouth daily.   Yes [provider]  hydrochlorothiazide (MICROZIDE) 12.5 MG capsule TAKE 1 CAPSULE (12.5 MG TOTAL) BY MOUTH DAILY WHEN FEET SWELL Patient taking differently: Take 12.5 mg by mouth daily as needed (feet swelling). TAKE 1 CAPSULE (12.5 MG TOTAL) BY MOUTH DAILY WHEN FEET SWELL 10/13/18  Yes Burns, Claudina Lick, MD  Multiple Vitamins-Minerals (PRESERVISION AREDS 2 PO) Take 1 capsule by mouth in the morning and at bedtime.   Yes [provider]  Polyethyl Glycol-Propyl Glycol (SYSTANE OP) Place 1 drop into both eyes daily. To both eyes.   Yes [provider]  Pumpkin Seed-Soy Germ (AZO BLADDER CONTROL/GO-LESS) CAPS Take 1 capsule by mouth daily.   Yes [provider]  acetaminophen (TYLENOL) 325 MG tablet Take 325 mg by mouth every 8 (eight) hours as needed for moderate pain.    [provider]  OVER THE COUNTER MEDICATION Take 1 tablet by  mouth at bedtime as needed (laxitive). PruneLax    [provider]     Family History  Problem Relation Age of Onset   Heart disease Mother         CHF; no MI   Breast cancer Sister        S/P bilateral mastectomy   Diabetes Maternal Grandmother    Thyroid disease Other        3 daughters   Stroke Brother 27   Colon cancer Neg Hx    Esophageal cancer Neg Hx    Stomach cancer Neg Hx    Rectal cancer Neg Hx     Social  History   Socioeconomic History   Marital status: Widowed    Spouse name: Not on file   Number of children: 4   Years of education: Not on file   Highest education level: Not on file  Occupational History   Occupation: Retired  Tobacco Use   Smoking status: Never   Smokeless tobacco: Never  Vaping Use   Vaping Use: Never used  Substance and Sexual Activity   Alcohol use: No    Alcohol/week: 0.0 standard drinks   Drug use: No   Sexual activity: Not on file  Other Topics Concern   Not on file  Social History Narrative   Not on file   Social Determinants of Health   Financial Resource Strain: Low Risk    Difficulty of Paying Living Expenses: Not hard at all  Food Insecurity: No Food Insecurity   Worried About Charity fundraiser in the Last Year: Never true   Leslie in the Last Year: Never true  Transportation Needs: No Transportation Needs   Lack of Transportation (Medical): No   Lack of Transportation (Non-Medical): No  Physical Activity: Sufficiently Active   Days of Exercise per Week: 5 days   Minutes of Exercise per Session: 30 min  Stress: No Stress Concern Present   Feeling of Stress : Not at all  Social Connections: Moderately Integrated   Frequency of Communication with Friends and Family: More than three times a week   Frequency of Social Gatherings with Friends and Family: More than three times a week   Attends Religious Services: More than 4 times per year   Active Member of Clubs or Organizations: No   Attends Music therapist: More than 4 times per year   Marital Status: Widowed     Review of Systems denies fever, headache, chest pain, dyspnea, cough, abdominal/back pain, nausea, vomiting or bleeding.  Vital Signs: BP (!) 156/67   Pulse 75   Temp 97.8 F (36.6 C) (Oral)   Resp 18   SpO2 100%   Physical Exam awake, alert.  Chest clear to auscultation bilaterally.  Heart with normal rate, ectopy noted.  Abdomen soft, positive  bowel sounds, nontender.  No significant lower extremity edema  Imaging: No results found.  Labs:  CBC: Recent Labs    10/16/20 1449 10/23/20 1338 11/23/20 0802  WBC 2.9* 3.0* 3.4*  HGB 9.2* 9.6* 9.9*  HCT 27.4* 28.7* 30.6*  PLT 243.0 244 259    COAGS: No results for input(s): INR, APTT in the last 8760 hours.  BMP: Recent Labs    10/16/20 1449 10/23/20 1338  NA 140 142  K 4.2 4.2  CL 104 104  CO2 31 28  GLUCOSE 93 95  BUN 15 13  CALCIUM 9.9 10.1  CREATININE 0.61 0.77  GFRNONAA  --  >  60    LIVER FUNCTION TESTS: Recent Labs    10/16/20 1449 10/23/20 1338  BILITOT 0.5 0.6  AST 17 15  ALT 13 13  ALKPHOS 57 64  PROT 6.1 6.4*  ALBUMIN 4.1 4.1    TUMOR MARKERS: No results for input(s): AFPTM, CEA, CA199, CHROMGRNA in the last 8760 hours.  Assessment and Plan: Olivia Evans is a 87 y.o. female with past medical history of arthritis, diverticulosis, hyperlipidemia, remote right breast cancer and interstitial cystitis who presents now with persistent macrocytic anemia and leukocytopenia of uncertain etiology.  She is scheduled today for CT-guided bone marrow biopsy to rule out myelodysplastic disorder.Risks and benefits of procedure was discussed with the patient including, but not limited to bleeding, infection, damage to adjacent structures or low yield requiring additional tests.  All of the questions were answered and there is agreement to proceed.  Consent signed and in chart.    Thank you for this interesting consult.  I greatly enjoyed meeting Olivia Evans and look forward to participating in their care.  A copy of this report was sent to the requesting provider on this date.  Electronically Signed: D. Rowe Robert, PA-C 11/23/2020, 8:43 AM   I spent a total of    20 minutes in face to face in clinical consultation, greater than 50% of which was counseling/coordinating care for CT-guided bone marrow biopsy

## 2020-11-27 LAB — SURGICAL PATHOLOGY

## 2020-11-28 ENCOUNTER — Other Ambulatory Visit: Payer: Self-pay

## 2020-11-28 ENCOUNTER — Inpatient Hospital Stay: Payer: PPO | Attending: Physician Assistant | Admitting: Internal Medicine

## 2020-11-28 VITALS — BP 139/65 | HR 95 | Temp 97.0°F | Resp 18 | Ht 65.0 in | Wt 128.6 lb

## 2020-11-28 DIAGNOSIS — D7589 Other specified diseases of blood and blood-forming organs: Secondary | ICD-10-CM

## 2020-11-28 DIAGNOSIS — D461 Refractory anemia with ring sideroblasts: Secondary | ICD-10-CM | POA: Diagnosis not present

## 2020-11-28 NOTE — Progress Notes (Signed)
[facility] Telephone:([phone]   [phone]  OFFICE PROGRESS NOTE  [name], MD [place] [address]  DIAGNOSIS: Low-grade myelodysplastic syndrome with ringed sideroblasts.  PRIOR THERAPY: None  CURRENT THERAPY: Observation  INTERVAL HISTORY: Olivia Evans 87 y.o. female returns to the clinic today for follow-up visit accompanied by her daughter.  The patient is feeling fine today with no concerning complaints except for fatigue and shortness of breath with exertion.  She denied having any current chest pain, cough or hemoptysis.  She denied having any fever or chills.  She has no nausea, vomiting, diarrhea or constipation.  She has no headache or visual changes.  The patient had no recent weight loss or night sweats.  She had bone marrow biopsy and aspirate performed recently and she is here for evaluation and discussion of her biopsy results and recommendation regarding her condition.  MEDICAL HISTORY: Past Medical History:  Diagnosis Date   Arthritis KNEES   Bladder pain    Chronic constipation    Diverticulosis 2013   Dr Fuller Plan   Dyslipidemia    Heart murmur    Hemorrhoids    History of adenomatous polyp of colon    2003 -- VILLOUS   History of breast cancer 2001---RIGHT BREAST CANCER   S/P PARTIAL MASTECTOMY AND RADIATION--  NO RECURRENCE   History of palpitations    Incomplete right bundle branch block (RBBB)    Interstitial cystitis    Dr McDiarmid   Lower urinary tract symptoms (LUTS)    Normal cardiac stress test    2000  PER MD NOTE   Osteopenia    Wears dentures    full -upper/  partial lower   Wears glasses     ALLERGIES:  is allergic to premarin [conjugated estrogens], vicodin [hydrocodone-acetaminophen], adhesive [tape], fluvastatin sodium, and simvastatin.  MEDICATIONS:  Current Outpatient Medications  Medication Sig Dispense Refill   acetaminophen (TYLENOL) 325 MG tablet Take 325 mg by  mouth every 8 (eight) hours as needed for moderate pain.     Cholecalciferol (VITAMIN D3) 1000 UNITS CAPS Take 4,000 Units by mouth daily.     hydrochlorothiazide (MICROZIDE) 12.5 MG capsule TAKE 1 CAPSULE (12.5 MG TOTAL) BY MOUTH DAILY WHEN FEET SWELL (Patient taking differently: Take 12.5 mg by mouth daily as needed (feet swelling). TAKE 1 CAPSULE (12.5 MG TOTAL) BY MOUTH DAILY WHEN FEET SWELL) 30 capsule 1   Multiple Vitamins-Minerals (PRESERVISION AREDS 2 PO) Take 1 capsule by mouth in the morning and at bedtime.     OVER THE COUNTER MEDICATION Take 1 tablet by mouth at bedtime as needed (laxitive). PruneLax     Polyethyl Glycol-Propyl Glycol (SYSTANE OP) Place 1 drop into both eyes daily. To both eyes.     Pumpkin Seed-Soy Germ (AZO BLADDER CONTROL/GO-LESS) CAPS Take 1 capsule by mouth daily.     No current facility-administered medications for this visit.    SURGICAL HISTORY:  Past Surgical History:  Procedure Laterality Date   BENIGN TUMOR REMOVED  1960'S   SECOND RIB   CATARACT EXTRACTION W/ INTRAOCULAR LENS  IMPLANT, BILATERAL  feb 2016   CYSTO WITH HYDRODISTENSION  07/29/2011   Procedure: CYSTOSCOPY/HYDRODISTENSION;  Surgeon: Reece Packer, MD;  Location[facility];  Service: Urology;  Laterality: N/A;  with fulguration of bladder ulcer   CYSTO WITH HYDRODISTENSION N/A 08/28/2014   Procedure: CYSTO/HYDRODISTENSION, FULGURATION OF ULCERS;  Surgeon: Bjorn Loser, MD;  Location:  [facility];  Service: Urology;  Laterality: N/A;   CYSTO/  HYDRODISTENTION/  INSTILLATION THERAPY  x2  01-18- and 11-15- 2011   CYSTO/ HOD/  BLADDER BX'S/  FULGERATION HUNNER ULCER'S /  INSTILLATION THERAPY  10-01-2010   PARTIAL MASTECTOMY W/ SLN DISSECTION Right 12-19-1999   ROTATOR CUFF REPAIR     TUBAL LIGATION     VAGINAL HYSTERECTOMY  1970'S   WITH APPENDECTOMY   VARICOSE VEIN SURGERY      REVIEW OF SYSTEMS:  Constitutional: positive for fatigue Eyes:  negative Ears, nose, mouth, throat, and face: negative Respiratory: positive for dyspnea on exertion Cardiovascular: negative Gastrointestinal: negative Genitourinary:negative Integument/breast: negative Hematologic/lymphatic: negative Musculoskeletal:positive for arthralgias Neurological: negative Behavioral/Psych: negative Endocrine: negative Allergic/Immunologic: negative   PHYSICAL EXAMINATION: General appearance: alert, cooperative, fatigued, and no distress Head: Normocephalic, without obvious abnormality, atraumatic Neck: no adenopathy, no JVD, supple, symmetrical, trachea midline, and thyroid not enlarged, symmetric, no tenderness/mass/nodules Lymph nodes: Cervical, supraclavicular, and axillary nodes normal. Resp: clear to auscultation bilaterally Back: symmetric, no curvature. ROM normal. No CVA tenderness. Cardio: regular rate and rhythm, S1, S2 normal, no murmur, click, rub or gallop GI: soft, non-tender; bowel sounds normal; no masses,  no organomegaly Extremities: extremities normal, atraumatic, no cyanosis or edema Neurologic: Alert and oriented X 3, normal strength and tone. Normal symmetric reflexes. Normal coordination and gait  ECOG PERFORMANCE STATUS: 1 - Symptomatic but completely ambulatory  Blood pressure 139/65, pulse 95, temperature (!) 97 F (36.1 C), temperature source Tympanic, resp. rate 18, height 5' 5"  (1.651 m), weight 128 lb 9.6 oz (58.3 kg), SpO2 97 %.  LABORATORY DATA: Lab Results  Component Value Date   WBC 3.4 (L) 12-21-2020   HGB 9.9 (L) 12-21-20   HCT 30.6 (L) 12/21/2020   MCV 114.2 (H) 12-21-2020   PLT 259 12-21-2020      Chemistry      Component Value Date/Time   NA 142 10/23/2020 1338   K 4.2 10/23/2020 1338   CL 104 10/23/2020 1338   CO2 28 10/23/2020 1338   BUN 13 10/23/2020 1338   CREATININE 0.77 10/23/2020 1338   CREATININE 0.63 10/14/2019 1501      Component Value Date/Time   CALCIUM 10.1 10/23/2020 1338   ALKPHOS  64 10/23/2020 1338   AST 15 10/23/2020 1338   ALT 13 10/23/2020 1338   BILITOT 0.6 10/23/2020 1338       RADIOGRAPHIC STUDIES: CT Biopsy  Result Date: 2020-12-21 INDICATION: 87 year old female with macrocytic anemia and leukopenia. She presents for bone marrow biopsy to evaluate for myelodysplastic syndrome. EXAM: CT GUIDED BONE MARROW ASPIRATION AND CORE BIOPSY Interventional Radiologist:  Criselda Peaches, MD MEDICATIONS: None. ANESTHESIA/SEDATION: Moderate (conscious) sedation was employed during this procedure. A total of 1.5 milligrams versed and 100 micrograms fentanyl were administered intravenously. The patient's level of consciousness and vital signs were monitored continuously by radiology nursing throughout the procedure under my direct supervision. Total monitored sedation time: 10 minutes FLUOROSCOPY TIME:  None COMPLICATIONS: None immediate. Estimated blood loss: <25 mL PROCEDURE: Informed written consent was obtained from the patient after a thorough discussion of the procedural risks, benefits and alternatives. All questions were addressed. Maximal Sterile Barrier Technique was utilized including caps, mask, sterile gowns, sterile gloves, sterile drape, hand hygiene and skin antiseptic. A timeout was performed prior to the initiation of the procedure. The patient was positioned prone and non-contrast localization CT was performed of the pelvis to demonstrate the iliac marrow spaces. Maximal barrier sterile  technique utilized including caps, mask, sterile gowns, sterile gloves, large sterile drape, hand hygiene, and betadine prep. Under sterile conditions and local anesthesia, an 11 gauge coaxial bone biopsy needle was advanced into the right iliac marrow space. Needle position was confirmed with CT imaging. Initially, bone marrow aspiration was performed. Next, the 11 gauge outer cannula was utilized to obtain a right iliac bone marrow core biopsy. Needle was removed. Hemostasis was  obtained with compression. The patient tolerated the procedure well. Samples were prepared with the cytotechnologist. IMPRESSION: Technically successful CT-guided bone marrow aspiration and core biopsy of the right iliac bone. Electronically Signed   By: Jacqulynn Cadet M.D.   On: 11/23/2020 16:50   CT BONE MARROW BIOPSY & ASPIRATION  Result Date: 11/23/2020 Criselda Peaches, MD     11/23/2020 10:04 AM Interventional Radiology Procedure Note Procedure: CT guided aspirate and core biopsy of right iliac bone Complications: None Recommendations: - Bedrest supine x 1 hrs - Hydrocodone PRN  Pain - Follow biopsy results Signed, Criselda Peaches, MD    ASSESSMENT AND PLAN: This is a very pleasant 87 years old white female recently diagnosed with low-grade myelodysplastic syndrome with ring sideroblasts based on a bone marrow biopsy and aspirate as well as her clinical presentation with bicytopenia including anemia and leukocytopenia. The patient had a bone marrow biopsy and aspirate recently.  I discussed the biopsy results with the patient and her daughter. I recommended for the patient to continue on observation and supportive care on as-needed basis. For the anemia her hemoglobin is 9.9 and she does not need any transfusion at this point.  We may consider The patient for ESA in the future if needed. We will also wait for the cytogenetics to see if the patient has del 5 as she may benefit from treatment with Revlimid. I recommended her to come back for follow-up visit in around 3 months for evaluation and repeat blood work but she was also advised to call immediately if she has any other concerning issues in the interval. The patient and her daughter had several questions and I answered them completely to their satisfaction. The patient voices understanding of current disease status and treatment options and is in agreement with the current care plan.  All questions were answered. The patient knows  to call the clinic with any problems, questions or concerns. We can certainly see the patient much sooner if necessary.  The total time spent in the appointment was 30 minutes.  Disclaimer: This note was dictated with voice recognition software. Similar sounding words can inadvertently be transcribed and may not be corrected upon review.

## 2020-12-03 ENCOUNTER — Encounter (HOSPITAL_COMMUNITY): Payer: Self-pay | Admitting: Internal Medicine

## 2020-12-07 ENCOUNTER — Telehealth: Payer: Self-pay | Admitting: Internal Medicine

## 2020-12-07 NOTE — Telephone Encounter (Signed)
Sch per 11/9 los, inbasket, left msg

## 2020-12-20 ENCOUNTER — Telehealth: Payer: Self-pay | Admitting: Internal Medicine

## 2020-12-20 ENCOUNTER — Encounter: Payer: Self-pay | Admitting: Internal Medicine

## 2020-12-20 NOTE — Telephone Encounter (Signed)
Appointment made for tomorrow at 2:40.

## 2020-12-20 NOTE — Telephone Encounter (Signed)
Patient calling c/o swelling in lower legs, would like  an increase in hydrochlorothiazide (MICROZIDE) 12.5 MG capsule  Advised pt she would need an appointment  Offered 12.2.22 at 2:40pm  Please advise if this is okay

## 2020-12-20 NOTE — Patient Instructions (Addendum)
   Try wearing your compression hose for a few days.    Medications changes include :   try taking 25 mg of the water pill   Your prescription(s) have been submitted to your pharmacy. Please take as directed and contact our office if you believe you are having problem(s) with the medication(s).

## 2020-12-20 NOTE — Progress Notes (Signed)
Subjective:    Patient ID: Olivia Evans, female    DOB: 07-02-1926, 87 y.o.   MRN: [id number]  This visit occurred during the SARS-CoV-2 public health emergency.  Safety protocols were in place, including screening questions prior to the visit, additional usage of staff PPE, and extensive cleaning of exam room while observing appropriate contact time as indicated for disinfecting solutions.    HPI The patient is here for an acute visit.  B/l leg swelling - she has chronic intermittent swelling and for years has taken hctz 12.5 mg prn for the swelling.   It swells more when she is on her feet more.  She elevates her feet when she sits.  The swelling increased about two weeks ago and they are not going down.  She has taken the hctz for 4 consecutive days and it has not helped.   Nothing has changed.  No change in meds, diet or activity   Medications and allergies reviewed with patient and updated if appropriate.  Patient Active Problem List   Diagnosis Date Noted   Bicytopenia 10/23/2020   History of breast cancer on right 10/16/2020   Anemia 11/28/2018   Prediabetes 10/13/2018   Bilateral leg edema 09/12/2017   Bilateral primary osteoarthritis of knee 12/22/2016   SOB (shortness of breath) on exertion 10/15/2016   Osteoporosis 03/26/2016   Diverticulosis of colon without hemorrhage 11/15/2013   Vitamin D deficiency 06/30/2012   INTERSTITIAL CYSTITIS 10/09/2009   Constipation 12/13/2007   History of colonic polyps 12/13/2007   SYNCOPE 06/24/2006    Current Outpatient Medications on File Prior to Visit  Medication Sig Dispense Refill   acetaminophen (TYLENOL) 325 MG tablet Take 325 mg by mouth every 8 (eight) hours as needed for moderate pain.     Cholecalciferol (VITAMIN D3) 1000 UNITS CAPS Take 4,000 Units by mouth daily.     Ferrous Sulfate (IRON PO) Take 1 tablet by mouth 2 (two) times daily. Patient takes Mega-Food Blood Builder Iron Minis     hydrochlorothiazide  (MICROZIDE) 12.5 MG capsule TAKE 1 CAPSULE (12.5 MG TOTAL) BY MOUTH DAILY WHEN FEET SWELL (Patient taking differently: Take 12.5 mg by mouth daily as needed (feet swelling). TAKE 1 CAPSULE (12.5 MG TOTAL) BY MOUTH DAILY WHEN FEET SWELL) 30 capsule 1   Multiple Vitamins-Minerals (PRESERVISION AREDS 2 PO) Take 1 capsule by mouth in the morning and at bedtime.     OVER THE COUNTER MEDICATION Take 1 tablet by mouth at bedtime as needed (laxitive). PruneLax     Polyethyl Glycol-Propyl Glycol (SYSTANE OP) Place 1 drop into both eyes daily. To both eyes.     Pumpkin Seed-Soy Germ (AZO BLADDER CONTROL/GO-LESS) CAPS Take 1 capsule by mouth daily.     No current facility-administered medications on file prior to visit.    Past Medical History:  Diagnosis Date   Arthritis KNEES   Bladder pain    Chronic constipation    Diverticulosis 2013   Dr Fuller Plan   Dyslipidemia    Heart murmur    Hemorrhoids    History of adenomatous polyp of colon    2003 -- VILLOUS   History of breast cancer 2001---RIGHT BREAST CANCER   S/P PARTIAL MASTECTOMY AND RADIATION--  NO RECURRENCE   History of palpitations    Incomplete right bundle branch block (RBBB)    Interstitial cystitis    Dr McDiarmid   Lower urinary tract symptoms (LUTS)    Normal cardiac stress test    2000  PER MD NOTE   Osteopenia    Wears dentures    full -upper/  partial lower   Wears glasses     Past Surgical History:  Procedure Laterality Date   BENIGN TUMOR REMOVED  1960'S   SECOND RIB   CATARACT EXTRACTION W/ INTRAOCULAR LENS  IMPLANT, BILATERAL  feb 2016   CYSTO WITH HYDRODISTENSION  07/29/2011   Procedure: CYSTOSCOPY/HYDRODISTENSION;  Surgeon: Reece Packer, MD;  Location: [facility];  Service: Urology;  Laterality: N/A;  with fulguration of bladder ulcer   CYSTO WITH HYDRODISTENSION N/A 08/28/2014   Procedure: CYSTO/HYDRODISTENSION, FULGURATION OF ULCERS;  Surgeon: Bjorn Loser, MD;  Location: [facility];  Service: Urology;  Laterality: N/A;   CYSTO/  HYDRODISTENTION/  INSTILLATION THERAPY  x2  01-18- and 11-15- 2011   CYSTO/ HOD/  BLADDER BX'S/  FULGERATION HUNNER ULCER'S /  INSTILLATION THERAPY  10-01-2010   PARTIAL MASTECTOMY W/ SLN DISSECTION Right 12-19-1999   ROTATOR CUFF REPAIR     TUBAL LIGATION     VAGINAL HYSTERECTOMY  1970'S   WITH APPENDECTOMY   VARICOSE VEIN SURGERY      Social History   Socioeconomic History   Marital status: Widowed    Spouse name: Not on file   Number of children: 4   Years of education: Not on file   Highest education level: Not on file  Occupational History   Occupation: Retired  Tobacco Use   Smoking status: Never   Smokeless tobacco: Never  Vaping Use   Vaping Use: Never used  Substance and Sexual Activity   Alcohol use: No    Alcohol/week: 0.0 standard drinks   Drug use: No   Sexual activity: Not on file  Other Topics Concern   Not on file  Social History Narrative   Not on file   Social Determinants of Health   Financial Resource Strain: Low Risk    Difficulty of Paying Living Expenses: Not hard at all  Food Insecurity: No Food Insecurity   Worried About Charity fundraiser in the Last Year: Never true   [place] in the Last Year: Never true  Transportation Needs: No Transportation Needs   Lack of Transportation (Medical): No   Lack of Transportation (Non-Medical): No  Physical Activity: Sufficiently Active   Days of Exercise per Week: 5 days   Minutes of Exercise per Session: 30 min  Stress: No Stress Concern Present   Feeling of Stress : Not at all  Social Connections: Moderately Integrated   Frequency of Communication with Friends and Family: More than three times a week   Frequency of Social Gatherings with Friends and Family: More than three times a week   Attends Religious Services: More than 4 times per year   Active Member of Clubs or Organizations: No   Attends Music therapist:  More than 4 times per year   Marital Status: Widowed    Family History  Problem Relation Age of Onset   Heart disease Mother         CHF; no MI   Breast cancer Sister        S/P bilateral mastectomy   Diabetes Maternal Grandmother    Thyroid disease Other        3 daughters   Stroke Brother 50   Colon cancer Neg Hx    Esophageal cancer Neg Hx    Stomach cancer Neg Hx    Rectal cancer Neg Hx  Review of Systems  Constitutional:  Negative for fever.  Respiratory:  Positive for cough (from allergies - not new) and shortness of breath (chronic - stable). Negative for wheezing.   Cardiovascular:  Positive for leg swelling. Negative for chest pain and palpitations.  Neurological:  Negative for light-headedness and headaches.      Objective:   Vitals:   12/21/20 1451  BP: (!) 120/58  Pulse: 76  Temp: 98 F (36.7 C)  SpO2: 99%   BP Readings from Last 3 Encounters:  12/21/20 (!) 120/58  11/28/20 139/65  11/23/20 140/71   Wt Readings from Last 3 Encounters:  12/21/20 128 lb (58.1 kg)  11/28/20 128 lb 9.6 oz (58.3 kg)  10/23/20 129 lb 3.2 oz (58.6 kg)   Body mass index is 21.3 kg/m.   Physical Exam    Constitutional: Appears well-developed and well-nourished. No distress.  Head: Normocephalic and atraumatic.  Neck: Neck supple. No tracheal deviation present. No thyromegaly present.  No cervical lymphadenopathy Cardiovascular: Normal rate, regular rhythm and normal heart sounds.  No murmur heard. No carotid bruit .  1 + bl pitting edema to just above ankles edema Pulmonary/Chest: Effort normal and breath sounds normal. No respiratory distress. No has no wheezes. No rales.  Skin: Skin is warm and dry. Not diaphoretic.  Psychiatric: Normal mood and affect. Behavior is normal.       Assessment & Plan:    See Problem List for Assessment and Plan of chronic medical problems.

## 2020-12-21 ENCOUNTER — Ambulatory Visit (INDEPENDENT_AMBULATORY_CARE_PROVIDER_SITE_OTHER): Payer: PPO | Admitting: Internal Medicine

## 2020-12-21 ENCOUNTER — Other Ambulatory Visit: Payer: Self-pay

## 2020-12-21 VITALS — BP 120/58 | HR 76 | Temp 98.0°F | Ht 65.0 in | Wt 128.0 lb

## 2020-12-21 DIAGNOSIS — R600 Localized edema: Secondary | ICD-10-CM | POA: Diagnosis not present

## 2020-12-21 DIAGNOSIS — R030 Elevated blood-pressure reading, without diagnosis of hypertension: Secondary | ICD-10-CM

## 2020-12-21 MED ORDER — HYDROCHLOROTHIAZIDE 12.5 MG PO CAPS: 12.5000 mg | ORAL_CAPSULE | Freq: Every day | ORAL | 5 refills | Status: DC | PRN

## 2020-12-21 NOTE — Assessment & Plan Note (Signed)
Acute on chronic 2 weeks of swelling of ankles that is not responding to the usual hctz 12. 5 mg  No obvious cause - likely her chronic venous insufficiency Advised to wear compression hose/socks Continue to elevate legs when sitting Increase hctz to 25 mg daily - can increase to 25 mg on occasion but needs to be aware of possible hypotension/lightheadedness/risk of falls She will go back to hctz 12.5 mg daily prn No concerning symptoms of HF or other more serious cause

## 2020-12-22 NOTE — Progress Notes (Signed)
This makes no sense.  The diagnosis is clearly listed as anemia, unspecified type.  That is the reason for the test.  Further, this test was ordered by the patient's oncologist.  I would encourage you to reach out to them if further info is required for coverage.

## 2021-01-24 DIAGNOSIS — C44629 Squamous cell carcinoma of skin of left upper limb, including shoulder: Secondary | ICD-10-CM | POA: Diagnosis not present

## 2021-01-24 DIAGNOSIS — L820 Inflamed seborrheic keratosis: Secondary | ICD-10-CM | POA: Diagnosis not present

## 2021-01-28 DIAGNOSIS — H353131 Nonexudative age-related macular degeneration, bilateral, early dry stage: Secondary | ICD-10-CM | POA: Diagnosis not present

## 2021-02-22 DIAGNOSIS — C44619 Basal cell carcinoma of skin of left upper limb, including shoulder: Secondary | ICD-10-CM | POA: Diagnosis not present

## 2021-02-22 DIAGNOSIS — L570 Actinic keratosis: Secondary | ICD-10-CM | POA: Diagnosis not present

## 2021-02-22 DIAGNOSIS — C44629 Squamous cell carcinoma of skin of left upper limb, including shoulder: Secondary | ICD-10-CM | POA: Diagnosis not present

## 2021-02-22 DIAGNOSIS — L300 Nummular dermatitis: Secondary | ICD-10-CM | POA: Diagnosis not present

## 2021-02-25 ENCOUNTER — Telehealth: Payer: Self-pay | Admitting: Internal Medicine

## 2021-02-25 DIAGNOSIS — H9193 Unspecified hearing loss, bilateral: Secondary | ICD-10-CM

## 2021-02-25 NOTE — Telephone Encounter (Signed)
Pt requesting a referral to an audiologist due to hearing aids not fitting   Offered pt an appt, pt declined   Pt states if appt is needed please call

## 2021-02-25 NOTE — Telephone Encounter (Signed)
Does she have someone that she has been going to?  If so she can call herself and does not need a referral.  If she needs someone new then I can place a referral

## 2021-02-26 ENCOUNTER — Other Ambulatory Visit: Payer: Self-pay

## 2021-02-26 DIAGNOSIS — D539 Nutritional anemia, unspecified: Secondary | ICD-10-CM

## 2021-02-26 NOTE — Telephone Encounter (Signed)
Referral ordered

## 2021-02-27 ENCOUNTER — Other Ambulatory Visit: Payer: Self-pay

## 2021-02-27 ENCOUNTER — Inpatient Hospital Stay: Payer: PPO | Attending: Physician Assistant

## 2021-02-27 ENCOUNTER — Inpatient Hospital Stay: Payer: PPO | Admitting: Internal Medicine

## 2021-02-27 VITALS — BP 143/49 | HR 80 | Temp 96.2°F | Resp 17 | Ht 65.0 in | Wt 130.0 lb

## 2021-02-27 DIAGNOSIS — D461 Refractory anemia with ring sideroblasts: Secondary | ICD-10-CM | POA: Diagnosis not present

## 2021-02-27 DIAGNOSIS — D7589 Other specified diseases of blood and blood-forming organs: Secondary | ICD-10-CM

## 2021-02-27 DIAGNOSIS — D539 Nutritional anemia, unspecified: Secondary | ICD-10-CM

## 2021-02-27 DIAGNOSIS — D72819 Decreased white blood cell count, unspecified: Secondary | ICD-10-CM | POA: Insufficient documentation

## 2021-02-27 LAB — IRON AND IRON BINDING CAPACITY (CC-WL,HP ONLY)
Iron: 135 ug/dL (ref 28–170)
Saturation Ratios: 47 % — ABNORMAL HIGH (ref 10.4–31.8)
TIBC: 290 ug/dL (ref 250–450)
UIBC: 155 ug/dL (ref 148–442)

## 2021-02-27 LAB — CBC WITH DIFFERENTIAL (CANCER CENTER ONLY)
Abs Immature Granulocytes: 0.01 10*3/uL (ref 0.00–0.07)
Basophils Absolute: 0.1 10*3/uL (ref 0.0–0.1)
Basophils Relative: 3 %
Eosinophils Absolute: 0.1 10*3/uL (ref 0.0–0.5)
Eosinophils Relative: 3 %
HCT: 29.8 % — ABNORMAL LOW (ref 36.0–46.0)
Hemoglobin: 9.7 g/dL — ABNORMAL LOW (ref 12.0–15.0)
Immature Granulocytes: 0 %
Lymphocytes Relative: 34 %
Lymphs Abs: 1 10*3/uL (ref 0.7–4.0)
MCH: 36.3 pg — ABNORMAL HIGH (ref 26.0–34.0)
MCHC: 32.6 g/dL (ref 30.0–36.0)
MCV: 111.6 fL — ABNORMAL HIGH (ref 80.0–100.0)
Monocytes Absolute: 0.2 10*3/uL (ref 0.1–1.0)
Monocytes Relative: 5 %
Neutro Abs: 1.5 10*3/uL — ABNORMAL LOW (ref 1.7–7.7)
Neutrophils Relative %: 55 %
Platelet Count: 223 10*3/uL (ref 150–400)
RBC: 2.67 MIL/uL — ABNORMAL LOW (ref 3.87–5.11)
RDW: 14.1 % (ref 11.5–15.5)
WBC Count: 2.8 10*3/uL — ABNORMAL LOW (ref 4.0–10.5)
nRBC: 0.7 % — ABNORMAL HIGH (ref 0.0–0.2)

## 2021-02-27 LAB — FERRITIN: Ferritin: 265 ng/mL (ref 11–307)

## 2021-02-27 NOTE — Progress Notes (Signed)
[facility] [phone]   [phone]  OFFICE PROGRESS NOTE  Olivia Rail, MD [place] [address]  DIAGNOSIS: Low-grade myelodysplastic syndrome with ringed sideroblasts.  PRIOR THERAPY: None  CURRENT THERAPY: Observation  INTERVAL HISTORY: Olivia Evans 88 y.o. female returns to the clinic today for follow-up visit accompanied by her daughter.  The patient is feeling fine today with no concerning complaints except for mild fatigue and swelling in lower extremities.  She is followed by her primary care physician and currently on hydrochlorothiazide.  She denied having any current chest pain, shortness of breath except with exertion with no cough or hemoptysis.  She denied having any fever or chills.  She has no nausea, vomiting, diarrhea or constipation.  Her cytogenetic studies showed no chromosomal abnormalities.  The patient is here today for evaluation and repeat blood work.  MEDICAL HISTORY: Past Medical History:  Diagnosis Date   Arthritis KNEES   Bladder pain    Chronic constipation    Diverticulosis 2013   Dr Fuller Plan   Dyslipidemia    Heart murmur    Hemorrhoids    History of adenomatous polyp of colon    2003 -- VILLOUS   History of breast cancer 2001---RIGHT BREAST CANCER   S/P PARTIAL MASTECTOMY AND RADIATION--  NO RECURRENCE   History of palpitations    Incomplete right bundle branch block (RBBB)    Interstitial cystitis    Dr McDiarmid   Lower urinary tract symptoms (LUTS)    Normal cardiac stress test    2000  PER MD NOTE   Osteopenia    Wears dentures    full -upper/  partial lower   Wears glasses     ALLERGIES:  is allergic to premarin [conjugated estrogens], vicodin [hydrocodone-acetaminophen], adhesive [tape], fluvastatin sodium, and simvastatin.  MEDICATIONS:  Current Outpatient Medications  Medication Sig Dispense Refill   acetaminophen (TYLENOL) 325 MG tablet Take 325 mg by mouth  every 8 (eight) hours as needed for moderate pain.     Cholecalciferol (VITAMIN D3) 1000 UNITS CAPS Take 4,000 Units by mouth daily.     Ferrous Sulfate (IRON PO) Take 1 tablet by mouth 2 (two) times daily. Patient takes Mega-Food Blood Builder Iron Minis     hydrochlorothiazide (MICROZIDE) 12.5 MG capsule Take 1 capsule (12.5 mg total) by mouth daily as needed (leg swelling). 30 capsule 5   Multiple Vitamins-Minerals (PRESERVISION AREDS 2 PO) Take 1 capsule by mouth in the morning and at bedtime.     OVER THE COUNTER MEDICATION Take 1 tablet by mouth at bedtime as needed (laxitive). PruneLax     Polyethyl Glycol-Propyl Glycol (SYSTANE OP) Place 1 drop into both eyes daily. To both eyes.     Pumpkin Seed-Soy Germ (AZO BLADDER CONTROL/GO-LESS) CAPS Take 1 capsule by mouth daily.     No current facility-administered medications for this visit.    SURGICAL HISTORY:  Past Surgical History:  Procedure Laterality Date   BENIGN TUMOR REMOVED  1960'S   SECOND RIB   CATARACT EXTRACTION W/ INTRAOCULAR LENS  IMPLANT, BILATERAL  feb 2016   CYSTO WITH HYDRODISTENSION  07/29/2011   Procedure: CYSTOSCOPY/HYDRODISTENSION;  Surgeon: Reece Packer, MD;  Location[facility];  Service: Urology;  Laterality: N/A;  with fulguration of bladder ulcer   CYSTO WITH HYDRODISTENSION N/A 08/28/2014   Procedure: CYSTO/HYDRODISTENSION, FULGURATION OF ULCERS;  Surgeon: Bjorn Loser, MD;  Location: [facility];  Service: Urology;  Laterality: N/A;   CYSTO/  HYDRODISTENTION/  INSTILLATION THERAPY  x2  01-18- and 11-15- 2011   CYSTO/ HOD/  BLADDER BX'S/  FULGERATION HUNNER ULCER'S /  INSTILLATION THERAPY  10-01-2010   PARTIAL MASTECTOMY W/ SLN DISSECTION Right 12-19-1999   ROTATOR CUFF REPAIR     TUBAL LIGATION     VAGINAL HYSTERECTOMY  1970'S   WITH APPENDECTOMY   VARICOSE VEIN SURGERY      REVIEW OF SYSTEMS:  A comprehensive review of systems was negative except for:  Constitutional: positive for fatigue Respiratory: positive for dyspnea on exertion   PHYSICAL EXAMINATION: General appearance: alert, cooperative, fatigued, and no distress Head: Normocephalic, without obvious abnormality, atraumatic Neck: no adenopathy, no JVD, supple, symmetrical, trachea midline, and thyroid not enlarged, symmetric, no tenderness/mass/nodules Lymph nodes: Cervical, supraclavicular, and axillary nodes normal. Resp: clear to auscultation bilaterally Back: symmetric, no curvature. ROM normal. No CVA tenderness. Cardio: regular rate and rhythm, S1, S2 normal, no murmur, click, rub or gallop GI: soft, non-tender; bowel sounds normal; no masses,  no organomegaly Extremities: extremities normal, atraumatic, no cyanosis or edema  ECOG PERFORMANCE STATUS: 1 - Symptomatic but completely ambulatory  Blood pressure (!) 143/49, pulse 80, temperature (!) 96.2 F (35.7 C), temperature source Tympanic, resp. rate 17, height 5' 5"  (1.651 m), weight 130 lb (59 kg), SpO2 100 %.  LABORATORY DATA: Lab Results  Component Value Date   WBC 2.8 (L) 02/27/2021   HGB 9.7 (L) 02/27/2021   HCT 29.8 (L) 02/27/2021   MCV 111.6 (H) 02/27/2021   PLT 223 02/27/2021      Chemistry      Component Value Date/Time   NA 142 10/23/2020 1338   K 4.2 10/23/2020 1338   CL 104 10/23/2020 1338   CO2 28 10/23/2020 1338   BUN 13 10/23/2020 1338   CREATININE 0.77 10/23/2020 1338   CREATININE 0.63 10/14/2019 1501      Component Value Date/Time   CALCIUM 10.1 10/23/2020 1338   ALKPHOS 64 10/23/2020 1338   AST 15 10/23/2020 1338   ALT 13 10/23/2020 1338   BILITOT 0.6 10/23/2020 1338       RADIOGRAPHIC STUDIES: No results found.  ASSESSMENT AND PLAN: This is a very pleasant 88 years old white female recently diagnosed with low-grade myelodysplastic syndrome with ring sideroblasts based on a bone marrow biopsy and aspirate as well as her clinical presentation with bicytopenia including anemia and  leukocytopenia. The patient is currently on observation and she is feeling fine with no concerning complaints except for mild fatigue and shortness of breath. Repeat CBC today showed stable anemia and leukocytopenia. I recommended for the patient to continue on observation with repeat CBC, iron study and ferritin in 6 months. She was advised to call immediately if she has any other concerning symptoms in the interval. The patient voices understanding of current disease status and treatment options and is in agreement with the current care plan.  All questions were answered. The patient knows to call the clinic with any problems, questions or concerns. We can certainly see the patient much sooner if necessary.  Disclaimer: This note was dictated with voice recognition software. Similar sounding words can inadvertently be transcribed and may not be corrected upon review.

## 2021-02-28 ENCOUNTER — Encounter: Payer: Self-pay | Admitting: Internal Medicine

## 2021-02-28 DIAGNOSIS — H9209 Otalgia, unspecified ear: Secondary | ICD-10-CM

## 2021-03-05 ENCOUNTER — Encounter: Payer: Self-pay | Admitting: Internal Medicine

## 2021-03-07 NOTE — Progress Notes (Signed)
Subjective:    Patient ID: Olivia Evans, female    DOB: 02-17-1926, 88 y.o.   MRN: [id number]  This visit occurred during the SARS-CoV-2 public health emergency.  Safety protocols were in place, including screening questions prior to the visit, additional usage of staff PPE, and extensive cleaning of exam room while observing appropriate contact time as indicated for disinfecting solutions.    HPI The patient is here for an acute visit.   He is here primarily for a referral to ENT.  She wears hearing aids and got those from [employer].  She went there recently to have the hearing aids adjusted because she was not able to hear out of both of them in the right ear it was uncomfortable to put the hearing aid in.  When she went to have them adjusted they advised that they were not able to adjust them because she was not able to get the hearing aid into the right ear comfortably and they advised seeing an ENT-stated that she may have some deformity    Chronic leg swelling.  She is taking the hydrochlorothiazide 25 mg daily and continues to have significant leg swelling that is uncomfortable.  She is interested in trying a stronger medication to see if that helps    Medications and allergies reviewed with patient and updated if appropriate.  Patient Active Problem List   Diagnosis Date Noted   Bicytopenia 10/23/2020   History of breast cancer on right 10/16/2020   Anemia 11/28/2018   Prediabetes 10/13/2018   Bilateral leg edema 09/12/2017   Bilateral primary osteoarthritis of knee 12/22/2016   SOB (shortness of breath) on exertion 10/15/2016   Osteoporosis 03/26/2016   Diverticulosis of colon without hemorrhage 11/15/2013   Vitamin D deficiency 06/30/2012   INTERSTITIAL CYSTITIS 10/09/2009   Constipation 12/13/2007   History of colonic polyps 12/13/2007   SYNCOPE 06/24/2006    Current Outpatient Medications on File Prior to Visit  Medication Sig Dispense Refill   acetaminophen  (TYLENOL) 325 MG tablet Take 325 mg by mouth every 8 (eight) hours as needed for moderate pain.     Cholecalciferol (VITAMIN D3) 1000 UNITS CAPS Take 4,000 Units by mouth daily.     Ferrous Sulfate (IRON PO) Take 1 tablet by mouth 2 (two) times daily. Patient takes Mega-Food Blood Builder Iron Minis     Multiple Vitamins-Minerals (PRESERVISION AREDS 2 PO) Take 1 capsule by mouth in the morning and at bedtime.     OVER THE COUNTER MEDICATION Take 1 tablet by mouth at bedtime as needed (laxitive). PruneLax     Polyethyl Glycol-Propyl Glycol (SYSTANE OP) Place 1 drop into both eyes daily. To both eyes.     Pumpkin Seed-Soy Germ (AZO BLADDER CONTROL/GO-LESS) CAPS Take 1 capsule by mouth daily.     No current facility-administered medications on file prior to visit.    Past Medical History:  Diagnosis Date   Arthritis KNEES   Bladder pain    Chronic constipation    Diverticulosis 2013   Dr Fuller Plan   Dyslipidemia    Heart murmur    Hemorrhoids    History of adenomatous polyp of colon    2003 -- VILLOUS   History of breast cancer 2001---RIGHT BREAST CANCER   S/P PARTIAL MASTECTOMY AND RADIATION--  NO RECURRENCE   History of palpitations    Incomplete right bundle branch block (RBBB)    Interstitial cystitis    Dr McDiarmid   Lower urinary tract symptoms (LUTS)  Normal cardiac stress test    2000  PER MD NOTE   Osteopenia    Wears dentures    full -upper/  partial lower   Wears glasses     Past Surgical History:  Procedure Laterality Date   BENIGN TUMOR REMOVED  1960'S   SECOND RIB   CATARACT EXTRACTION W/ INTRAOCULAR LENS  IMPLANT, BILATERAL  feb 2016   CYSTO WITH HYDRODISTENSION  07/29/2011   Procedure: CYSTOSCOPY/HYDRODISTENSION;  Surgeon: Reece Packer, MD;  Location: [facility];  Service: Urology;  Laterality: N/A;  with fulguration of bladder ulcer   CYSTO WITH HYDRODISTENSION N/A 08/28/2014   Procedure: CYSTO/HYDRODISTENSION, FULGURATION OF ULCERS;   Surgeon: Bjorn Loser, MD;  Location: [facility];  Service: Urology;  Laterality: N/A;   CYSTO/  HYDRODISTENTION/  INSTILLATION THERAPY  x2  01-18- and 11-15- 2011   CYSTO/ HOD/  BLADDER BX'S/  FULGERATION HUNNER ULCER'S /  INSTILLATION THERAPY  10-01-2010   PARTIAL MASTECTOMY W/ SLN DISSECTION Right 12-19-1999   ROTATOR CUFF REPAIR     TUBAL LIGATION     VAGINAL HYSTERECTOMY  1970'S   WITH APPENDECTOMY   VARICOSE VEIN SURGERY      Social History   Socioeconomic History   Marital status: Widowed    Spouse name: Not on file   Number of children: 4   Years of education: Not on file   Highest education level: Not on file  Occupational History   Occupation: Retired  Tobacco Use   Smoking status: Never   Smokeless tobacco: Never  Vaping Use   Vaping Use: Never used  Substance and Sexual Activity   Alcohol use: No    Alcohol/week: 0.0 standard drinks   Drug use: No   Sexual activity: Not on file  Other Topics Concern   Not on file  Social History Narrative   Not on file   Social Determinants of Health   Financial Resource Strain: Low Risk    Difficulty of Paying Living Expenses: Not hard at all  Food Insecurity: No Food Insecurity   Worried About Charity fundraiser in the Last Year: Never true   [place] in the Last Year: Never true  Transportation Needs: No Transportation Needs   Lack of Transportation (Medical): No   Lack of Transportation (Non-Medical): No  Physical Activity: Sufficiently Active   Days of Exercise per Week: 5 days   Minutes of Exercise per Session: 30 min  Stress: No Stress Concern Present   Feeling of Stress : Not at all  Social Connections: Moderately Integrated   Frequency of Communication with Friends and Family: More than three times a week   Frequency of Social Gatherings with Friends and Family: More than three times a week   Attends Religious Services: More than 4 times per year   Active Member of Clubs or  Organizations: No   Attends Music therapist: More than 4 times per year   Marital Status: Widowed    Family History  Problem Relation Age of Onset   Heart disease Mother         CHF; no MI   Breast cancer Sister        S/P bilateral mastectomy   Diabetes Maternal Grandmother    Thyroid disease Other        3 daughters   Stroke Brother 52   Colon cancer Neg Hx    Esophageal cancer Neg Hx    Stomach cancer  Neg Hx    Rectal cancer Neg Hx     Review of Systems  Constitutional:  Negative for fever.  HENT:  Positive for ear pain (right - intermittent).   Respiratory:  Negative for shortness of breath.   Cardiovascular:  Positive for leg swelling. Negative for chest pain and palpitations.  Neurological:  Negative for light-headedness and headaches.      Objective:   Vitals:   03/08/21 0934  BP: (!) 120/58  Pulse: 95  Temp: 98 F (36.7 C)  SpO2: 97%   BP Readings from Last 3 Encounters:  03/08/21 (!) 120/58  02/27/21 (!) 143/49  12/21/20 (!) 120/58   Wt Readings from Last 3 Encounters:  03/08/21 130 lb (59 kg)  02/27/21 130 lb (59 kg)  12/21/20 128 lb (58.1 kg)   Body mass index is 21.63 kg/m.   Physical Exam    Constitutional: Appears well-developed and well-nourished. No distress.  HENT:  Head: Normocephalic and atraumatic.  Ears:  left ear canal and TM normal.  Right ear canal normal, TM dull and there is loss of structures - no erythema, intact Neck: Neck supple. No tracheal deviation present. No thyromegaly present.  No cervical lymphadenopathy Cardiovascular: B/l moderate edema Skin: Skin is warm and dry. Not diaphoretic.  Psychiatric: anxious mood and affect. Behavior is normal.      Assessment & Plan:    See Problem List for Assessment and Plan of chronic medical problems.

## 2021-03-08 ENCOUNTER — Ambulatory Visit (INDEPENDENT_AMBULATORY_CARE_PROVIDER_SITE_OTHER): Payer: PPO | Admitting: Internal Medicine

## 2021-03-08 ENCOUNTER — Other Ambulatory Visit: Payer: Self-pay

## 2021-03-08 ENCOUNTER — Encounter: Payer: Self-pay | Admitting: Internal Medicine

## 2021-03-08 VITALS — BP 120/58 | HR 95 | Temp 98.0°F | Ht 65.0 in | Wt 130.0 lb

## 2021-03-08 DIAGNOSIS — H9193 Unspecified hearing loss, bilateral: Secondary | ICD-10-CM

## 2021-03-08 DIAGNOSIS — R600 Localized edema: Secondary | ICD-10-CM

## 2021-03-08 DIAGNOSIS — H9201 Otalgia, right ear: Secondary | ICD-10-CM | POA: Insufficient documentation

## 2021-03-08 MED ORDER — FUROSEMIDE 20 MG PO TABS: 20.0000 mg | ORAL_TABLET | Freq: Every day | ORAL | 5 refills | Status: DC

## 2021-03-08 NOTE — Assessment & Plan Note (Signed)
Chronic decreased hearing Did get hearing aids from [employer], but they are not working well-particularly in the right ear because of discomfort with a hearing aid and she cannot hear with them We will refer to ENT for further evaluation

## 2021-03-08 NOTE — Assessment & Plan Note (Signed)
Chronic decreased hearing Did get hearing aids from [employer], but they are not working well-particularly in the right ear because of discomfort with a hearing aid-she is not able to wear it and she cannot hear with them Your canal appears normal, but tympanic membrane has loss of structures and dull appearing-not sure how well the hearing aid may help We will refer to ENT for further evaluation

## 2021-03-08 NOTE — Assessment & Plan Note (Addendum)
Chronic Not controlled on hctz 25 mg daily Stop hctz, start lasix 20 mg daily Bmp next week Discuss possible side effects

## 2021-03-08 NOTE — Patient Instructions (Addendum)
° ° ° °  Blood work was ordered.  Have this done next week   Medications changes include :   stop hctz and start lasix 20 mg daily   Your prescription(s) have been sent to your pharmacy.    A referral was ordered for ENT.     Someone from that office will call you to schedule an appointment.    Return for schedule labs - nonfasting next week.

## 2021-03-11 ENCOUNTER — Other Ambulatory Visit (INDEPENDENT_AMBULATORY_CARE_PROVIDER_SITE_OTHER): Payer: PPO

## 2021-03-11 DIAGNOSIS — R600 Localized edema: Secondary | ICD-10-CM | POA: Diagnosis not present

## 2021-03-11 LAB — BASIC METABOLIC PANEL
BUN: 20 mg/dL (ref 6–23)
CO2: 36 mEq/L — ABNORMAL HIGH (ref 19–32)
Calcium: 9.8 mg/dL (ref 8.4–10.5)
Chloride: 105 mEq/L (ref 96–112)
Creatinine, Ser: 0.71 mg/dL (ref 0.40–1.20)
GFR: 73.49 mL/min (ref >60.00)
Glucose, Bld: 102 mg/dL — ABNORMAL HIGH (ref 70–99)
Potassium: 3.9 mEq/L (ref 3.5–5.1)
Sodium: 142 mEq/L (ref 135–145)

## 2021-04-25 ENCOUNTER — Ambulatory Visit (INDEPENDENT_AMBULATORY_CARE_PROVIDER_SITE_OTHER): Payer: PPO

## 2021-04-25 DIAGNOSIS — Z0000 Encounter for general adult medical examination without abnormal findings: Secondary | ICD-10-CM | POA: Diagnosis not present

## 2021-04-25 NOTE — Patient Instructions (Signed)
Ms. Cranor , ?Thank you for taking time to come for your Medicare Wellness Visit. I appreciate your ongoing commitment to your health goals. Please review the following plan we discussed and let me know if I can assist you in the future.  ? ?Screening recommendations/referrals: ?Colonoscopy: No longer recommended due to age ?Mammogram: No longer recommended due to age ?Bone Density: No longer recommended due to age ?Recommended yearly ophthalmology/optometry visit for glaucoma screening and checkup ?Recommended yearly dental visit for hygiene and checkup ? ?Vaccinations: ?Influenza vaccine: 10/16/2020 ?Pneumococcal vaccine: 04/01/2011, 09/14/2017 ?Tdap vaccine: due ?Shingles vaccine: never done   ?Covid-19: 08/22/2019, 09/19/2019 ? ?Advanced directives: Please bring a copy of your health care power of attorney and living will to the office at your convenience. ? ?Conditions/risks identified: Yes ? ?Next appointment: Please schedule your next Medicare Wellness Visit with your Nurse Health Advisor in 1 year by calling [phone]. ? ? ?Preventive Care 38 Years and Older, Female ?Preventive care refers to lifestyle choices and visits with your health care provider that can promote health and wellness. ?What does preventive care include? ?A yearly physical exam. This is also called an annual well check. ?Dental exams once or twice a year. ?Routine eye exams. Ask your health care provider how often you should have your eyes checked. ?Personal lifestyle choices, including: ?Daily care of your teeth and gums. ?Regular physical activity. ?Eating a healthy diet. ?Avoiding tobacco and drug use. ?Limiting alcohol use. ?Practicing safe sex. ?Taking low-dose aspirin every day. ?Taking vitamin and mineral supplements as recommended by your health care provider. ?What happens during an annual well check? ?The services and screenings done by your health care provider during your annual well check will depend on your age, overall  health, lifestyle risk factors, and family history of disease. ?Counseling  ?Your health care provider may ask you questions about your: ?Alcohol use. ?Tobacco use. ?Drug use. ?Emotional well-being. ?Home and relationship well-being. ?Sexual activity. ?Eating habits. ?History of falls. ?Memory and ability to understand (cognition). ?Work and work Statistician. ?Reproductive health. ?Screening  ?You may have the following tests or measurements: ?Height, weight, and BMI. ?Blood pressure. ?Lipid and cholesterol levels. These may be checked every 5 years, or more frequently if you are over 66 years old. ?Skin check. ?Lung cancer screening. You may have this screening every year starting at age 1 if you have a 30-pack-year history of smoking and currently smoke or have quit within the past 15 years. ?Fecal occult blood test (FOBT) of the stool. You may have this test every year starting at age 20. ?Flexible sigmoidoscopy or colonoscopy. You may have a sigmoidoscopy every 5 years or a colonoscopy every 10 years starting at age 44. ?Hepatitis C blood test. ?Hepatitis B blood test. ?Sexually transmitted disease (STD) testing. ?Diabetes screening. This is done by checking your blood sugar (glucose) after you have not eaten for a while (fasting). You may have this done every 1-3 years. ?Bone density scan. This is done to screen for osteoporosis. You may have this done starting at age 1. ?Mammogram. This may be done every 1-2 years. Talk to your health care provider about how often you should have regular mammograms. ?Talk with your health care provider about your test results, treatment options, and if necessary, the need for more tests. ?Vaccines  ?Your health care provider may recommend certain vaccines, such as: ?Influenza vaccine. This is recommended every year. ?Tetanus, diphtheria, and acellular pertussis (Tdap, Td) vaccine. You may need a Td booster every 10  years. ?Zoster vaccine. You may need this after age  38. ?Pneumococcal 13-valent conjugate (PCV13) vaccine. One dose is recommended after age 73. ?Pneumococcal polysaccharide (PPSV23) vaccine. One dose is recommended after age 20. ?Talk to your health care provider about which screenings and vaccines you need and how often you need them. ?This information is not intended to replace advice given to you by your health care provider. Make sure you discuss any questions you have with your health care provider. ?Document Released: 02/02/2015 Document Revised: 09/26/2015 Document Reviewed: 11/07/2014 ?Elsevier Interactive Patient Education ? 2017 [place]. ? ?Fall Prevention in the Home ?Falls can cause injuries. They can happen to people of all ages. There are many things you can do to make your home safe and to help prevent falls. ?What can I do on the outside of my home? ?Regularly fix the edges of walkways and driveways and fix any cracks. ?Remove anything that might make you trip as you walk through a door, such as a raised step or threshold. ?Trim any bushes or trees on the path to your home. ?Use bright outdoor lighting. ?Clear any walking paths of anything that might make someone trip, such as rocks or tools. ?Regularly check to see if handrails are loose or broken. Make sure that both sides of any steps have handrails. ?Any raised decks and porches should have guardrails on the edges. ?Have any leaves, snow, or ice cleared regularly. ?Use sand or salt on walking paths during winter. ?Clean up any spills in your garage right away. This includes oil or grease spills. ?What can I do in the bathroom? ?Use night lights. ?Install grab bars by the toilet and in the tub and shower. Do not use towel bars as grab bars. ?Use non-skid mats or decals in the tub or shower. ?If you need to sit down in the shower, use a plastic, non-slip stool. ?Keep the floor dry. Clean up any water that spills on the floor as soon as it happens. ?Remove soap buildup in the tub or shower  regularly. ?Attach bath mats securely with double-sided non-slip rug tape. ?Do not have throw rugs and other things on the floor that can make you trip. ?What can I do in the bedroom? ?Use night lights. ?Make sure that you have a light by your bed that is easy to reach. ?Do not use any sheets or blankets that are too big for your bed. They should not hang down onto the floor. ?Have a firm chair that has side arms. You can use this for support while you get dressed. ?Do not have throw rugs and other things on the floor that can make you trip. ?What can I do in the kitchen? ?Clean up any spills right away. ?Avoid walking on wet floors. ?Keep items that you use a lot in easy-to-reach places. ?If you need to reach something above you, use a strong step stool that has a grab bar. ?Keep electrical cords out of the way. ?Do not use floor polish or wax that makes floors slippery. If you must use wax, use non-skid floor wax. ?Do not have throw rugs and other things on the floor that can make you trip. ?What can I do with my stairs? ?Do not leave any items on the stairs. ?Make sure that there are handrails on both sides of the stairs and use them. Fix handrails that are broken or loose. Make sure that handrails are as long as the stairways. ?Check any carpeting to make sure  that it is firmly attached to the stairs. Fix any carpet that is loose or worn. ?Avoid having throw rugs at the top or bottom of the stairs. If you do have throw rugs, attach them to the floor with carpet tape. ?Make sure that you have a light switch at the top of the stairs and the bottom of the stairs. If you do not have them, ask someone to add them for you. ?What else can I do to help prevent falls? ?Wear shoes that: ?Do not have high heels. ?Have rubber bottoms. ?Are comfortable and fit you well. ?Are closed at the toe. Do not wear sandals. ?If you use a stepladder: ?Make sure that it is fully opened. Do not climb a closed stepladder. ?Make sure that  both sides of the stepladder are locked into place. ?Ask someone to hold it for you, if possible. ?Clearly mark and make sure that you can see: ?Any grab bars or handrails. ?First and last steps. ?Where the edge of eac

## 2021-04-25 NOTE — Progress Notes (Signed)
?I connected with Olivia Evans today by telephone and verified that I am speaking with the correct person using two identifiers. ?Location patient: home ?Location provider: work ?Persons participating in the virtual visit: patient, provider. ?  ?I discussed the limitations, risks, security and privacy concerns of performing an evaluation and management service by telephone and the availability of in person appointments. I also discussed with the patient that there may be a patient responsible charge related to this service. The patient expressed understanding and verbally consented to this telephonic visit.  ?  ?Interactive audio and video telecommunications were attempted between this provider and patient, however failed, due to patient having technical difficulties OR patient did not have access to video capability.  We continued and completed visit with audio only. ? ?Some vital signs may be absent or patient reported.  ? ?Time Spent with patient on telephone encounter: 30 minutes ? ?Subjective:  ? Olivia Evans is a 88 y.o. female who presents for Medicare Annual (Subsequent) preventive examination. ? ?Review of Systems    ? ?Cardiac Risk Factors include: advanced age (>90mn, >>82women);family history of premature cardiovascular disease ? ?   ?Objective:  ?  ?There were no vitals filed for this visit. ?There is no height or weight on file to calculate BMI. ? ? ?  04/25/2021  ? 10:50 AM 11/23/2020  ?  8:23 AM 10/23/2020  ?  2:06 PM 04/12/2020  ? 10:36 AM 11/01/2014  ? 12:29 PM 08/28/2014  ?  6:56 AM  ?Advanced Directives  ?Does Patient Have a Medical Advance Directive? Yes Yes Yes Yes Yes Yes  ?Type of Advance Directive Living will;Healthcare Power of [place]  Living will  ?Does patient want to make changes to medical advance directive? No - Patient declined No - Patient declined No - Patient  declined No - Patient declined  No - Patient declined  ?Copy of [place] Chart? No - copy requested No - copy requested No - copy requested No - copy requested No - copy requested No - copy requested  ? ? ?Current Medications (verified) ?Outpatient Encounter Medications as of 04/25/2021  ?Medication Sig  ? acetaminophen (TYLENOL) 325 MG tablet Take 325 mg by mouth every 8 (eight) hours as needed for moderate pain.  ? Cholecalciferol (VITAMIN D3) 1000 UNITS CAPS Take 4,000 Units by mouth daily.  ? Ferrous Sulfate (IRON PO) Take 1 tablet by mouth 2 (two) times daily. Patient takes Mega-Food Blood Builder Iron Minis  ? furosemide (LASIX) 20 MG tablet Take 1 tablet (20 mg total) by mouth daily.  ? Multiple Vitamins-Minerals (PRESERVISION AREDS 2 PO) Take 1 capsule by mouth in the morning and at bedtime.  ? OVER THE COUNTER MEDICATION Take 1 tablet by mouth at bedtime as needed (laxitive). PruneLax  ? Polyethyl Glycol-Propyl Glycol (SYSTANE OP) Place 1 drop into both eyes daily. To both eyes.  ? Pumpkin Seed-Soy Germ (AZO BLADDER CONTROL/GO-LESS) CAPS Take 1 capsule by mouth daily.  ? ?No facility-administered encounter medications on file as of 04/25/2021.  ? ? ?Allergies (verified) ?Premarin [conjugated estrogens], Vicodin [hydrocodone-acetaminophen], Adhesive [tape], Fluvastatin sodium, and Simvastatin  ? ?History: ?Past Medical History:  ?Diagnosis Date  ? Arthritis KNEES  ? Bladder pain   ? Chronic constipation   ? Diverticulosis 2013  ? Dr SFuller Plan ? Dyslipidemia   ? Heart murmur   ? Hemorrhoids   ? History  of adenomatous polyp of colon   ? 2003 -- VILLOUS  ? History of breast cancer 2001---RIGHT BREAST CANCER  ? S/P PARTIAL MASTECTOMY AND RADIATION--  NO RECURRENCE  ? History of palpitations   ? Incomplete right bundle branch block (RBBB)   ? Interstitial cystitis   ? Dr McDiarmid  ? Lower urinary tract symptoms (LUTS)   ? Normal cardiac stress test   ? 2000  PER MD NOTE  ? Osteopenia   ? Wears  dentures   ? full -upper/  partial lower  ? Wears glasses   ? ?Past Surgical History:  ?Procedure Laterality Date  ? BENIGN TUMOR REMOVED  1960'S  ? SECOND RIB  ? CATARACT EXTRACTION W/ INTRAOCULAR LENS  IMPLANT, BILATERAL  feb 2016  ? CYSTO WITH HYDRODISTENSION  07/29/2011  ? Procedure: CYSTOSCOPY/HYDRODISTENSION;  Surgeon: Reece Packer, MD;  Location: [facility];  Service: Urology;  Laterality: N/A;  with fulguration of bladder ulcer  ? CYSTO WITH HYDRODISTENSION N/A 08/28/2014  ? Procedure: CYSTO/HYDRODISTENSION, FULGURATION OF ULCERS;  Surgeon: Bjorn Loser, MD;  Location: [facility];  Service: Urology;  Laterality: N/A;  ? CYSTO/  HYDRODISTENTION/  INSTILLATION THERAPY  x2  01-18- and 11-15- 2011  ? CYSTO/ HOD/  BLADDER BX'S/  FULGERATION HUNNER ULCER'S /  INSTILLATION THERAPY  10-01-2010  ? PARTIAL MASTECTOMY W/ SLN DISSECTION Right 12-19-1999  ? ROTATOR CUFF REPAIR    ? TUBAL LIGATION    ? VAGINAL HYSTERECTOMY  1970'S  ? WITH APPENDECTOMY  ? VARICOSE VEIN SURGERY    ? ?Family History  ?Problem Relation Age of Onset  ? Heart disease Mother   ?      CHF; no MI  ? Breast cancer Sister   ?     S/P bilateral mastectomy  ? Diabetes Maternal Grandmother   ? Thyroid disease Other   ?     3 daughters  ? Stroke Brother 40  ? Colon cancer Neg Hx   ? Esophageal cancer Neg Hx   ? Stomach cancer Neg Hx   ? Rectal cancer Neg Hx   ? ?Social History  ? ?Socioeconomic History  ? Marital status: Widowed  ?  Spouse name: Not on file  ? Number of children: 4  ? Years of education: Not on file  ? Highest education level: Not on file  ?Occupational History  ? Occupation: Retired  ?Tobacco Use  ? Smoking status: Never  ? Smokeless tobacco: Never  ?Vaping Use  ? Vaping Use: Never used  ?Substance and Sexual Activity  ? Alcohol use: No  ?  Alcohol/week: 0.0 standard drinks  ? Drug use: No  ? Sexual activity: Not on file  ?Other Topics Concern  ? Not on file  ?Social History Narrative  ? Not on  file  ? ?Social Determinants of Health  ? ?Financial Resource Strain: Low Risk   ? Difficulty of Paying Living Expenses: Not hard at all  ?Food Insecurity: No Food Insecurity  ? Worried About Charity fundraiser in the Last Year: Never true  ? Ran Out of Food in the Last Year: Never true  ?Transportation Needs: No Transportation Needs  ? Lack of Transportation (Medical): No  ? Lack of Transportation (Non-Medical): No  ?Physical Activity: Sufficiently Active  ? Days of Exercise per Week: 5 days  ? Minutes of Exercise per Session: 30 min  ?Stress: No Stress Concern Present  ? Feeling of Stress : Not at all  ?Social Connections:  Moderately Integrated  ? Frequency of Communication with Friends and Family: More than three times a week  ? Frequency of Social Gatherings with Friends and Family: More than three times a week  ? Attends Religious Services: More than 4 times per year  ? Active Member of Clubs or Organizations: No  ? Attends Archivist Meetings: More than 4 times per year  ? Marital Status: Widowed  ? ? ?Tobacco Counseling ?Counseling given: Not Answered ? ? ?Clinical Intake: ? ?Pre-visit preparation completed: Yes ? ?Pain : No/denies pain ? ?  ? ?Nutritional Risks: None ?Diabetes: No ? ?How often do you need to have someone help you when you read instructions, pamphlets, or other written materials from your doctor or pharmacy?: 1 - Never ?What is the last grade level you completed in school?: HSG ? ?Diabetic? no ? ?Interpreter Needed?: No ? ?Information entered by :: Lisette Abu, LPN ? ? ?Activities of Daily Living ? ?  04/25/2021  ? 10:54 AM 11/23/2020  ?  8:20 AM  ?In your present state of health, do you have any difficulty performing the following activities:  ?Hearing? 1 0  ?Vision? 0 0  ?Difficulty concentrating or making decisions? 0 0  ?Walking or climbing stairs? 1 1  ?Comment knee pain   ?Dressing or bathing? 0 0  ?Doing errands, shopping? 0   ?Preparing Food and eating ? N   ?Using the  Toilet? N   ?In the past six months, have you accidently leaked urine? Y   ?Do you have problems with loss of bowel control? N   ?Managing your Medications? N   ?Managing your Finances? Y   ?Housekeeping or

## 2021-05-27 DIAGNOSIS — J342 Deviated nasal septum: Secondary | ICD-10-CM | POA: Diagnosis not present

## 2021-05-27 DIAGNOSIS — J343 Hypertrophy of nasal turbinates: Secondary | ICD-10-CM | POA: Diagnosis not present

## 2021-05-27 DIAGNOSIS — H903 Sensorineural hearing loss, bilateral: Secondary | ICD-10-CM | POA: Diagnosis not present

## 2021-08-27 ENCOUNTER — Inpatient Hospital Stay (HOSPITAL_BASED_OUTPATIENT_CLINIC_OR_DEPARTMENT_OTHER): Payer: PPO | Admitting: Internal Medicine

## 2021-08-27 ENCOUNTER — Inpatient Hospital Stay: Payer: PPO | Attending: Internal Medicine

## 2021-08-27 ENCOUNTER — Other Ambulatory Visit: Payer: Self-pay

## 2021-08-27 VITALS — BP 157/61 | HR 74 | Temp 98.2°F | Resp 16 | Ht 65.0 in | Wt 128.9 lb

## 2021-08-27 DIAGNOSIS — Z79899 Other long term (current) drug therapy: Secondary | ICD-10-CM | POA: Insufficient documentation

## 2021-08-27 DIAGNOSIS — D649 Anemia, unspecified: Secondary | ICD-10-CM

## 2021-08-27 DIAGNOSIS — D4620 Refractory anemia with excess of blasts, unspecified: Secondary | ICD-10-CM | POA: Insufficient documentation

## 2021-08-27 DIAGNOSIS — D7589 Other specified diseases of blood and blood-forming organs: Secondary | ICD-10-CM

## 2021-08-27 LAB — CBC WITH DIFFERENTIAL (CANCER CENTER ONLY)
Abs Immature Granulocytes: 0.01 10*3/uL (ref 0.00–0.07)
Basophils Absolute: 0.1 10*3/uL (ref 0.0–0.1)
Basophils Relative: 2 %
Eosinophils Absolute: 0.2 10*3/uL (ref 0.0–0.5)
Eosinophils Relative: 6 %
HCT: 27.1 % — ABNORMAL LOW (ref 36.0–46.0)
Hemoglobin: 9.3 g/dL — ABNORMAL LOW (ref 12.0–15.0)
Immature Granulocytes: 0 %
Lymphocytes Relative: 42 %
Lymphs Abs: 1.4 10*3/uL (ref 0.7–4.0)
MCH: 37.5 pg — ABNORMAL HIGH (ref 26.0–34.0)
MCHC: 34.3 g/dL (ref 30.0–36.0)
MCV: 109.3 fL — ABNORMAL HIGH (ref 80.0–100.0)
Monocytes Absolute: 0.2 10*3/uL (ref 0.1–1.0)
Monocytes Relative: 7 %
Neutro Abs: 1.4 10*3/uL — ABNORMAL LOW (ref 1.7–7.7)
Neutrophils Relative %: 43 %
Platelet Count: 243 10*3/uL (ref 150–400)
RBC: 2.48 MIL/uL — ABNORMAL LOW (ref 3.87–5.11)
RDW: 13.5 % (ref 11.5–15.5)
WBC Count: 3.2 10*3/uL — ABNORMAL LOW (ref 4.0–10.5)
nRBC: 0 % (ref 0.0–0.2)

## 2021-08-27 LAB — IRON AND IRON BINDING CAPACITY (CC-WL,HP ONLY)
Iron: 105 ug/dL (ref 28–170)
Saturation Ratios: 42 % — ABNORMAL HIGH (ref 10.4–31.8)
TIBC: 249 ug/dL — ABNORMAL LOW (ref 250–450)
UIBC: 144 ug/dL — ABNORMAL LOW (ref 148–442)

## 2021-08-27 LAB — FERRITIN: Ferritin: 294 ng/mL (ref 11–307)

## 2021-08-27 NOTE — Progress Notes (Signed)
[facility] [phone]   [phone]  OFFICE PROGRESS NOTE  [name], MD [place] [address]  DIAGNOSIS: Low-grade myelodysplastic syndrome with ringed sideroblasts.  PRIOR THERAPY: None  CURRENT THERAPY: Observation  INTERVAL HISTORY: Olivia Evans 88 y.o. female returns to the clinic today for 6 months follow-up visit accompanied by her daughter.  The patient is feeling fine today with no concerning complaints except for the mild fatigue as well as shortness of breath with exertion.  She denied having any current chest pain, cough or hemoptysis.  She has no nausea, vomiting, diarrhea or constipation.  She has no headache or visual changes.  She denied having any concerning weight loss or night sweats.  She is here today for evaluation and repeat blood work.  MEDICAL HISTORY: Past Medical History:  Diagnosis Date   Arthritis KNEES   Bladder pain    Chronic constipation    Diverticulosis 2013   Dr Fuller Plan   Dyslipidemia    Heart murmur    Hemorrhoids    History of adenomatous polyp of colon    2003 -- VILLOUS   History of breast cancer 2001---RIGHT BREAST CANCER   S/P PARTIAL MASTECTOMY AND RADIATION--  NO RECURRENCE   History of palpitations    Incomplete right bundle branch block (RBBB)    Interstitial cystitis    Dr McDiarmid   Lower urinary tract symptoms (LUTS)    Normal cardiac stress test    2000  PER MD NOTE   Osteopenia    Wears dentures    full -upper/  partial lower   Wears glasses     ALLERGIES:  is allergic to premarin [conjugated estrogens], vicodin [hydrocodone-acetaminophen], adhesive [tape], fluvastatin sodium, and simvastatin.  MEDICATIONS:  Current Outpatient Medications  Medication Sig Dispense Refill   acetaminophen (TYLENOL) 325 MG tablet Take 325 mg by mouth every 8 (eight) hours as needed for moderate pain.     Cholecalciferol (VITAMIN D3) 1000 UNITS CAPS Take 4,000 Units by  mouth daily.     Ferrous Sulfate (IRON PO) Take 1 tablet by mouth 2 (two) times daily. Patient takes Mega-Food Blood Builder Iron Minis     furosemide (LASIX) 20 MG tablet Take 1 tablet (20 mg total) by mouth daily. 30 tablet 5   Multiple Vitamins-Minerals (PRESERVISION AREDS 2 PO) Take 1 capsule by mouth in the morning and at bedtime.     OVER THE COUNTER MEDICATION Take 1 tablet by mouth at bedtime as needed (laxitive). PruneLax     Polyethyl Glycol-Propyl Glycol (SYSTANE OP) Place 1 drop into both eyes daily. To both eyes.     Pumpkin Seed-Soy Germ (AZO BLADDER CONTROL/GO-LESS) CAPS Take 1 capsule by mouth daily.     No current facility-administered medications for this visit.    SURGICAL HISTORY:  Past Surgical History:  Procedure Laterality Date   BENIGN TUMOR REMOVED  1960'S   SECOND RIB   CATARACT EXTRACTION W/ INTRAOCULAR LENS  IMPLANT, BILATERAL  feb 2016   CYSTO WITH HYDRODISTENSION  07/29/2011   Procedure: CYSTOSCOPY/HYDRODISTENSION;  Surgeon: Reece Packer, MD;  Location: [facility];  Service: Urology;  Laterality: N/A;  with fulguration of bladder ulcer   CYSTO WITH HYDRODISTENSION N/A 08/28/2014   Procedure: CYSTO/HYDRODISTENSION, FULGURATION OF ULCERS;  Surgeon: Bjorn Loser, MD;  Location: [facility];  Service: Urology;  Laterality: N/A;   CYSTO/  HYDRODISTENTION/  INSTILLATION THERAPY  x2  01-18-  and 11-15- 2011   CYSTO/ HOD/  BLADDER BX'S/  FULGERATION HUNNER ULCER'S /  INSTILLATION THERAPY  10-01-2010   PARTIAL MASTECTOMY W/ SLN DISSECTION Right 12-19-1999   ROTATOR CUFF REPAIR     TUBAL LIGATION     VAGINAL HYSTERECTOMY  1970'S   WITH APPENDECTOMY   VARICOSE VEIN SURGERY      REVIEW OF SYSTEMS:  A comprehensive review of systems was negative except for: Constitutional: positive for fatigue Respiratory: positive for dyspnea on exertion   PHYSICAL EXAMINATION: General appearance: alert, cooperative, fatigued, and no  distress Head: Normocephalic, without obvious abnormality, atraumatic Neck: no adenopathy, no JVD, supple, symmetrical, trachea midline, and thyroid not enlarged, symmetric, no tenderness/mass/nodules Lymph nodes: Cervical, supraclavicular, and axillary nodes normal. Resp: clear to auscultation bilaterally Back: symmetric, no curvature. ROM normal. No CVA tenderness. Cardio: regular rate and rhythm, S1, S2 normal, no murmur, click, rub or gallop GI: soft, non-tender; bowel sounds normal; no masses,  no organomegaly Extremities: extremities normal, atraumatic, no cyanosis or edema  ECOG PERFORMANCE STATUS: 1 - Symptomatic but completely ambulatory  Blood pressure (!) 157/61, pulse 74, temperature 98.2 F (36.8 C), resp. rate 16, height 5' 5"  (1.651 m), weight 128 lb 14.4 oz (58.5 kg), SpO2 97 %.  LABORATORY DATA: Lab Results  Component Value Date   WBC 2.8 (L) 02/27/2021   HGB 9.7 (L) 02/27/2021   HCT 29.8 (L) 02/27/2021   MCV 111.6 (H) 02/27/2021   PLT 223 02/27/2021      Chemistry      Component Value Date/Time   NA 142 03/11/2021 0856   K 3.9 03/11/2021 0856   CL 105 03/11/2021 0856   CO2 36 (H) 03/11/2021 0856   BUN 20 03/11/2021 0856   CREATININE 0.71 03/11/2021 0856   CREATININE 0.77 10/23/2020 1338   CREATININE 0.63 10/14/2019 1501      Component Value Date/Time   CALCIUM 9.8 03/11/2021 0856   ALKPHOS 64 10/23/2020 1338   AST 15 10/23/2020 1338   ALT 13 10/23/2020 1338   BILITOT 0.6 10/23/2020 1338       RADIOGRAPHIC STUDIES: No results found.  ASSESSMENT AND PLAN: This is a very pleasant 88 years old white female recently diagnosed with low-grade myelodysplastic syndrome with ring sideroblasts based on a bone marrow biopsy and aspirate as well as her clinical presentation with bicytopenia including anemia and leukocytopenia. The patient is currently on observation.  She continues to have mild fatigue and shortness of breath with exertion. Repeat CBC today  showed stable low hemoglobin and hematocrit as well as mild leukocytopenia. I recommended for her to continue on observation with oral iron supplements. I will see her back for follow-up visit in 6 months for evaluation with repeat blood work. She was advised to call immediately if she has any other concerning symptoms in the interval. The patient voices understanding of current disease status and treatment options and is in agreement with the current care plan.  All questions were answered. The patient knows to call the clinic with any problems, questions or concerns. We can certainly see the patient much sooner if necessary.  Disclaimer: This note was dictated with voice recognition software. Similar sounding words can inadvertently be transcribed and may not be corrected upon review.

## 2021-10-03 NOTE — Progress Notes (Unsigned)
    Subjective:    Patient ID: Olivia Evans, female    DOB: 1926-08-02, 88 y.o.   MRN: [id number]      HPI Olivia Evans is here for No chief complaint on file.   Ear pain -      Medications and allergies reviewed with patient and updated if appropriate.  Current Outpatient Medications on File Prior to Visit  Medication Sig Dispense Refill   acetaminophen (TYLENOL) 325 MG tablet Take 325 mg by mouth every 8 (eight) hours as needed for moderate pain.     Cholecalciferol (VITAMIN D3) 1000 UNITS CAPS Take 4,000 Units by mouth daily.     Ferrous Sulfate (IRON PO) Take 1 tablet by mouth 2 (two) times daily. Patient takes Mega-Food Blood Builder Iron Minis     furosemide (LASIX) 20 MG tablet Take 1 tablet (20 mg total) by mouth daily. 30 tablet 5   Multiple Vitamins-Minerals (PRESERVISION AREDS 2 PO) Take 1 capsule by mouth in the morning and at bedtime.     OVER THE COUNTER MEDICATION Take 1 tablet by mouth at bedtime as needed (laxitive). PruneLax     Polyethyl Glycol-Propyl Glycol (SYSTANE OP) Place 1 drop into both eyes daily. To both eyes.     Pumpkin Seed-Soy Germ (AZO BLADDER CONTROL/GO-LESS) CAPS Take 1 capsule by mouth daily.     No current facility-administered medications on file prior to visit.    Review of Systems     Objective:  There were no vitals filed for this visit. BP Readings from Last 3 Encounters:  08/27/21 (!) 157/61  03/08/21 (!) 120/58  02/27/21 (!) 143/49   Wt Readings from Last 3 Encounters:  08/27/21 128 lb 14.4 oz (58.5 kg)  03/08/21 130 lb (59 kg)  02/27/21 130 lb (59 kg)   There is no height or weight on file to calculate BMI.    Physical Exam         Assessment & Plan:    See Problem List for Assessment and Plan of chronic medical problems.

## 2021-10-04 ENCOUNTER — Encounter: Payer: Self-pay | Admitting: Internal Medicine

## 2021-10-04 ENCOUNTER — Ambulatory Visit (INDEPENDENT_AMBULATORY_CARE_PROVIDER_SITE_OTHER): Payer: PPO | Admitting: Internal Medicine

## 2021-10-04 VITALS — BP 120/72 | HR 80 | Temp 97.9°F | Ht 65.0 in | Wt 124.0 lb

## 2021-10-04 DIAGNOSIS — Z23 Encounter for immunization: Secondary | ICD-10-CM | POA: Diagnosis not present

## 2021-10-04 DIAGNOSIS — M5481 Occipital neuralgia: Secondary | ICD-10-CM | POA: Diagnosis not present

## 2021-10-04 NOTE — Assessment & Plan Note (Signed)
Acute Pain started three days ago - intermittent sharp transient pain on left side of head Symptoms c/w occipital neuralgia Tylenol has helped some - will continue tylenol for now given side effects of alternatives Can try massage, heat, ice to posterior neck Referral to neurology  - advised to call if symptoms worsen

## 2021-10-04 NOTE — Patient Instructions (Addendum)
       Medications changes include :   none    A referral was ordered for neurology.     Someone from that office will call you to schedule an appointment.     Return if symptoms worsen or fail to improve.

## 2021-10-07 ENCOUNTER — Encounter: Payer: Self-pay | Admitting: Neurology

## 2021-10-07 ENCOUNTER — Telehealth: Payer: Self-pay

## 2021-10-07 ENCOUNTER — Encounter: Payer: Self-pay | Admitting: Physician Assistant

## 2021-10-07 ENCOUNTER — Ambulatory Visit (INDEPENDENT_AMBULATORY_CARE_PROVIDER_SITE_OTHER): Payer: PPO | Admitting: Physician Assistant

## 2021-10-07 ENCOUNTER — Ambulatory Visit (INDEPENDENT_AMBULATORY_CARE_PROVIDER_SITE_OTHER): Payer: PPO

## 2021-10-07 ENCOUNTER — Telehealth: Payer: Self-pay | Admitting: Orthopaedic Surgery

## 2021-10-07 ENCOUNTER — Encounter: Payer: Self-pay | Admitting: Internal Medicine

## 2021-10-07 DIAGNOSIS — M542 Cervicalgia: Secondary | ICD-10-CM | POA: Diagnosis not present

## 2021-10-07 MED ORDER — TRAMADOL HCL 50 MG PO TABS: 50.0000 mg | ORAL_TABLET | Freq: Two times a day (BID) | ORAL | 0 refills | Status: DC | PRN

## 2021-10-07 MED ORDER — GABAPENTIN 100 MG PO CAPS: 100.0000 mg | ORAL_CAPSULE | Freq: Two times a day (BID) | ORAL | 3 refills | Status: DC

## 2021-10-07 NOTE — Progress Notes (Signed)
Office Visit Note   Patient: Olivia Evans           Date of Birth: 1926/04/20           MRN: [id number] Visit Date: 10/07/2021              Requested by: [name], MD [place],  [place] [address] PCP: [name], MD  Chief Complaint  Patient presents with   Neck - Pain      HPI: Olivia Evans is a pleasant 88 year old woman who is a patient of Dr. Rudene Anda.  She comes in today with a chief complaint of left-sided neck pain.  She denies any injury.  The neck pain goes up around her ear and into her scalp.  She denies any dizziness any lack of coordination.  The pain comes in waves.  She was evaluated by her primary care provider who diagnosed her with occipital neuralgia.  She was prescribed gabapentin which she has not started to take yet.  She did have an elevation of her symptoms over the weekend and called her primary care provider who suggested tramadol.  She denies any paresthesias in her arms any weakness.  She did have a previous MRI in 2016 of her cervical spine which demonstrated considerable spondylosis and degenerative disc disease but the foramina and spinal canal was patent.  She did also have a degenerative anterior listhesis at C5-6 and C7-T1 and facet edema on the right at C5-6.  Exam  Assessment & Plan: Visit Diagnoses:  1. Neck pain     Plan: Her exam today is consistent with an occipital neuralgia.  She does not have any radicular symptoms going down her arms rather it goes up into her scalp which would be more consistent with an occipital neuralgia.  Her previous MRI in 2016 did show degenerative changes and facet edema.  She has not had any injury.  I agree with her primary care provider that she should start the gabapentin.  I will also order her some tramadol.  Also have given her a soft collar to see if this makes her more comfortable.  We will discuss her case with Dr. Durward Fortes when he returns on Monday as she is a longtime patient of  hers.  If she did not get relief with the tramadol and the gabapentin could consider a steroid taper  Follow-Up Instructions: No follow-ups on file.   Ortho Exam  Patient is alert, oriented, no adenopathy, well-dressed, normal affect, normal respiratory effort. Examination of her neck she says that she has severe symptoms when she extends her neck so did not do this.  Turn side to side and forward without too much problems.  She does demonstrate intermittent spasms in her pain.  Her upper extremities sensation is intact she has 5 out of 5 strength with grip strength in resisted extension flexion of her arms and abduction.  No tenderness in her cervical spine no erythema  Imaging: XR Cervical Spine 2 or 3 views  Result Date: 10/07/2021 2 view radiographs of her cervical spine demonstrate overall well-maintained alignment.  She does have degenerative changes especially at C4-5 C5-6 C6-7.  Do not see any acute fractures or other osseous injuries  No images are attached to the encounter.  Labs: Lab Results  Component Value Date   HGBA1C 5.8 10/16/2020   HGBA1C 5.5 10/14/2019   HGBA1C 5.7 10/13/2018     Lab Results  Component Value Date  ALBUMIN 4.1 10/23/2020   ALBUMIN 4.1 10/16/2020   ALBUMIN 4.4 10/13/2018    Lab Results  Component Value Date   MG 1.9 10/15/2016   MG 2.2 06/24/2006   Lab Results  Component Value Date   VD25OH 99.87 10/16/2020   VD25OH 39.57 10/15/2016   VD25OH 33.35 03/26/2016    No results found for: "PREALBUMIN"    Latest Ref Rng & Units 08/27/2021    1:08 PM 02/27/2021    9:48 AM 11/23/2020    8:02 AM  CBC EXTENDED  WBC 4.0 - 10.5 K/uL 3.2  2.8  3.4   RBC 3.87 - 5.11 MIL/uL 2.48  2.67  2.68   Hemoglobin 12.0 - 15.0 g/dL 9.3  9.7  9.9   HCT 36.0 - 46.0 % 27.1  29.8  30.6   Platelets 150 - 400 K/uL 243  223  259   NEUT# 1.7 - 7.7 K/uL 1.4  1.5  1.5   Lymph# 0.7 - 4.0 K/uL 1.4  1.0  1.3      There is no height or weight on file to calculate  BMI.  Orders:  Orders Placed This Encounter  Procedures   XR Cervical Spine 2 or 3 views   Meds ordered this encounter  Medications   traMADol (ULTRAM) 50 MG tablet    Sig: Take 1 tablet (50 mg total) by mouth every 12 (twelve) hours as needed.    Dispense:  30 tablet    Refill:  0     Procedures: No procedures performed  Clinical Data: No additional findings.  ROS:  All other systems negative, except as noted in the HPI. Review of Systems  Objective: Vital Signs: There were no vitals taken for this visit.  Specialty Comments:  No specialty comments available.  PMFS History: Patient Active Problem List   Diagnosis Date Noted   Neck pain 10/07/2021   Occipital neuralgia of left side 10/04/2021   Decreased hearing of both ears 03/08/2021   Ear pain, right 03/08/2021   Bicytopenia 10/23/2020   History of breast cancer on right 10/16/2020   Anemia 11/28/2018   Prediabetes 10/13/2018   Bilateral leg edema 09/12/2017   Bilateral primary osteoarthritis of knee 12/22/2016   SOB (shortness of breath) on exertion 10/15/2016   Osteoporosis 03/26/2016   Diverticulosis of colon without hemorrhage 11/15/2013   Vitamin D deficiency 06/30/2012   INTERSTITIAL CYSTITIS 10/09/2009   Constipation 12/13/2007   History of colonic polyps 12/13/2007   SYNCOPE 06/24/2006   Past Medical History:  Diagnosis Date   Arthritis KNEES   Bladder pain    Chronic constipation    Diverticulosis 2013   Dr Fuller Plan   Dyslipidemia    Heart murmur    Hemorrhoids    History of adenomatous polyp of colon    2003 -- VILLOUS   History of breast cancer 2001---RIGHT BREAST CANCER   S/P PARTIAL MASTECTOMY AND RADIATION--  NO RECURRENCE   History of palpitations    Incomplete right bundle branch block (RBBB)    Interstitial cystitis    Dr McDiarmid   Lower urinary tract symptoms (LUTS)    Normal cardiac stress test    2000  PER MD NOTE   Osteopenia    Wears dentures    full -upper/  partial  lower   Wears glasses     Family History  Problem Relation Age of Onset   Heart disease Mother         CHF; no MI  Breast cancer Sister        S/P bilateral mastectomy   Diabetes Maternal Grandmother    Thyroid disease Other        3 daughters   Stroke Brother 8   Colon cancer Neg Hx    Esophageal cancer Neg Hx    Stomach cancer Neg Hx    Rectal cancer Neg Hx     Past Surgical History:  Procedure Laterality Date   BENIGN TUMOR REMOVED  1960'S   SECOND RIB   CATARACT EXTRACTION W/ INTRAOCULAR LENS  IMPLANT, BILATERAL  feb 2016   CYSTO WITH HYDRODISTENSION  07/29/2011   Procedure: CYSTOSCOPY/HYDRODISTENSION;  Surgeon: Reece Packer, MD;  Location: [facility];  Service: Urology;  Laterality: N/A;  with fulguration of bladder ulcer   CYSTO WITH HYDRODISTENSION N/A 08/28/2014   Procedure: CYSTO/HYDRODISTENSION, FULGURATION OF ULCERS;  Surgeon: Bjorn Loser, MD;  Location: [facility];  Service: Urology;  Laterality: N/A;   CYSTO/  HYDRODISTENTION/  INSTILLATION THERAPY  x2  01-18- and 11-15- 2011   CYSTO/ HOD/  BLADDER BX'S/  FULGERATION HUNNER ULCER'S /  INSTILLATION THERAPY  10-01-2010   PARTIAL MASTECTOMY W/ SLN DISSECTION Right 12-19-1999   ROTATOR CUFF REPAIR     TUBAL LIGATION     VAGINAL HYSTERECTOMY  1970'S   WITH APPENDECTOMY   VARICOSE VEIN SURGERY     Social History   Occupational History   Occupation: Retired  Tobacco Use   Smoking status: Never   Smokeless tobacco: Never  Vaping Use   Vaping Use: Never used  Substance and Sexual Activity   Alcohol use: No    Alcohol/week: 0.0 standard drinks of alcohol   Drug use: No   Sexual activity: Not on file

## 2021-10-07 NOTE — Telephone Encounter (Signed)
See my chart message

## 2021-10-07 NOTE — Telephone Encounter (Signed)
Patients daughter called asking for an appointment with Dr. Durward Fortes, I was able to get her an appointment for Wednesday the 20th. Daughter states that her mother is having severe left neck/ear pain and has gone to their PCP to rule out an ear infection and would like to know if she can be seen sooner with another provider/PA and if not would can be done to lesson the pain. CB # [phone]

## 2021-10-07 NOTE — Telephone Encounter (Signed)
Pt daughter is requesting a Rx for something that will ease the pt pain.  She reports that the patient had a tough weekend. She states that Gabapentin was mentioned when they were here on this past Friday 10/04/21.  Pharmacy: [id number]

## 2021-10-09 ENCOUNTER — Ambulatory Visit: Payer: PPO | Admitting: Orthopaedic Surgery

## 2021-10-10 ENCOUNTER — Telehealth: Payer: Self-pay | Admitting: Physician Assistant

## 2021-10-10 NOTE — Telephone Encounter (Signed)
Patient's daughter called advised the Gabapentin is working. Horris Latino asked if (PT) would help her mother.  Horris Latino said it helped before. The number to contact Horris Latino is [phone]

## 2021-10-11 ENCOUNTER — Other Ambulatory Visit: Payer: Self-pay | Admitting: Physician Assistant

## 2021-10-11 DIAGNOSIS — M542 Cervicalgia: Secondary | ICD-10-CM

## 2021-10-11 DIAGNOSIS — M5481 Occipital neuralgia: Secondary | ICD-10-CM

## 2021-10-21 ENCOUNTER — Ambulatory Visit: Payer: PPO | Admitting: Physical Therapy

## 2021-10-21 ENCOUNTER — Encounter: Payer: Self-pay | Admitting: Physical Therapy

## 2021-10-21 DIAGNOSIS — M542 Cervicalgia: Secondary | ICD-10-CM | POA: Diagnosis not present

## 2021-10-21 DIAGNOSIS — M6281 Muscle weakness (generalized): Secondary | ICD-10-CM | POA: Diagnosis not present

## 2021-10-21 NOTE — Therapy (Signed)
OUTPATIENT PHYSICAL THERAPY CERVICAL EVALUATION   Patient Name: Olivia Evans MRN: [id number] DOB:[birth year], 88 y.o., female Today's Date: 10/21/2021   PT End of Session - 10/21/21 1636     Visit Number 1    Number of Visits 6    Date for PT Re-Evaluation 12/13/21    PT Start Time 3903    PT Stop Time 1430    PT Time Calculation (min) 45 min    Activity Tolerance Patient tolerated treatment well    Behavior During Therapy [facility] for tasks assessed/performed             Past Medical History:  Diagnosis Date   Arthritis KNEES   Bladder pain    Chronic constipation    Diverticulosis 2013   Dr Fuller Plan   Dyslipidemia    Heart murmur    Hemorrhoids    History of adenomatous polyp of colon    2003 -- VILLOUS   History of breast cancer 2001---RIGHT BREAST CANCER   S/P PARTIAL MASTECTOMY AND RADIATION--  NO RECURRENCE   History of palpitations    Incomplete right bundle branch block (RBBB)    Interstitial cystitis    Dr McDiarmid   Lower urinary tract symptoms (LUTS)    Normal cardiac stress test    2000  PER MD NOTE   Osteopenia    Wears dentures    full -upper/  partial lower   Wears glasses    Past Surgical History:  Procedure Laterality Date   BENIGN TUMOR REMOVED  1960'S   SECOND RIB   CATARACT EXTRACTION W/ INTRAOCULAR LENS  IMPLANT, BILATERAL  feb 2016   CYSTO WITH HYDRODISTENSION  07/29/2011   Procedure: CYSTOSCOPY/HYDRODISTENSION;  Surgeon: Reece Packer, MD;  Location: [facility];  Service: Urology;  Laterality: N/A;  with fulguration of bladder ulcer   CYSTO WITH HYDRODISTENSION N/A 08/28/2014   Procedure: CYSTO/HYDRODISTENSION, FULGURATION OF ULCERS;  Surgeon: Bjorn Loser, MD;  Location: [facility];  Service: Urology;  Laterality: N/A;   CYSTO/  HYDRODISTENTION/  INSTILLATION THERAPY  x2  01-18- and 11-15- 2011   CYSTO/ HOD/  BLADDER BX'S/  FULGERATION HUNNER ULCER'S /  INSTILLATION THERAPY  10-01-2010   PARTIAL  MASTECTOMY W/ SLN DISSECTION Right 12-19-1999   ROTATOR CUFF REPAIR     TUBAL LIGATION     VAGINAL HYSTERECTOMY  1970'S   WITH APPENDECTOMY   VARICOSE VEIN SURGERY     Patient Active Problem List   Diagnosis Date Noted   Neck pain 10/07/2021   Occipital neuralgia of left side 10/04/2021   Decreased hearing of both ears 03/08/2021   Ear pain, right 03/08/2021   Bicytopenia 10/23/2020   History of breast cancer on right 10/16/2020   Anemia 11/28/2018   Prediabetes 10/13/2018   Bilateral leg edema 09/12/2017   Bilateral primary osteoarthritis of knee 12/22/2016   SOB (shortness of breath) on exertion 10/15/2016   Osteoporosis 03/26/2016   Diverticulosis of colon without hemorrhage 11/15/2013   Vitamin D deficiency 06/30/2012   INTERSTITIAL CYSTITIS 10/09/2009   Constipation 12/13/2007   History of colonic polyps 12/13/2007   SYNCOPE 06/24/2006    PCP: [name], MD  REFERRING PROVIDER: Persons, Bevely Palmer, PA  REFERRING DIAG: M54.2 Neck pain M54.81 occipital neuralgia  THERAPY DIAG:  Cervicalgia  Muscle weakness (generalized)  Rationale for Evaluation and Treatment Rehabilitation  ONSET DATE: about 4 weeks ago around 10/01/21.   SUBJECTIVE:  SUBJECTIVE STATEMENT: Pt stating she started having neck pain which traveled up her neck and over her left ear. Pt stating the pain was so bad at times she felt like crying. Pt stating since the MD started her on gabapentin that she is in no pain currently.   PERTINENT HISTORY:  arthritis, diverticulosis, heart murmur, breast CA, mastectomy c radiation 2001, osteopenia, rotator cuff repair, hysterectomy, caricose vein surgery  PAIN:  NPRS scale: no pain at present, 10/10 when not on her new pain meds Pain location: left  neck Pain description: stabbing  Aggravating factors: before meds took effect Relieving factors: Gabapentin  PRECAUTIONS: None  WEIGHT BEARING RESTRICTIONS  none  FALLS:  Has patient fallen in last 6 months? No  LIVING ENVIRONMENT: Lives with: lives alone Lives in: House/apartment Stairs: Yes: External: 3 steps; on left going up Has following equipment at home: raised toilet, grab bars in shower    PLOF: Independent  PATIENT GOALS stop hurting  OBJECTIVE:   PATIENT SURVEYS:   10/21/21: FOTO intake:  53% (predicted:  60%)   COGNITION:  Overall cognitive status: Within functional limits for tasks assessed  SENSATION:  WFL  POSTURE:   rounded shoulders and forward head, kyphotic thoracici spine  PALPATION:  TTP Rt upper trap, Rt levator, Rt infraspinatus   CERVICAL ROM:   Active ROM AROM (deg)   Flexion 45  Extension 22  Right lateral flexion 10  Left lateral flexion 20  Right rotation 34  Left rotation 46   (Blank rows = not tested)  UPPER EXTREMITY ROM:  Active ROM Right  Left   Shoulder flexion [facility] WFL  Shoulder extension [facility]  Shoulder abduction    Shoulder adduction    Shoulder extension    Shoulder internal rotation    Shoulder external rotation    Elbow flexion    Elbow extension    Wrist flexion    Wrist extension    Wrist ulnar deviation    Wrist radial deviation    Wrist pronation    Wrist supination     (Blank rows = not tested)  UPPER EXTREMITY MMT:  MMT Right  Left   Shoulder flexion 4-/5 4-/5  Shoulder extension 4/5 4/5  Shoulder abduction 4/5 4/5  Shoulder adduction    Shoulder extension    Shoulder internal rotation    Shoulder external rotation    Middle trapezius    Lower trapezius    Elbow flexion    Elbow extension    Wrist flexion    Wrist extension    Wrist ulnar deviation    Wrist radial deviation    Wrist pronation    Wrist supination    Grip strength     (Blank rows = not  tested)     FUNCTIONAL TESTS:  10/21/21: TUG: to be performed at next visit    TODAY'S TREATMENT:  10/21/21:   Therex:    HEP instruction/performance c cues for techniques, handout provided.  Trial set performed of each for comprehension and symptom assessment.  See below for exercise list    PATIENT EDUCATION:   Education details: HEP, POC Person educated: Patient Education method: Explanation, Demonstration, Verbal cues, and Handouts Education comprehension: verbalized understanding, returned demonstration, and verbal cues required    HOME EXERCISE PROGRAM: Access Code: [email or web address] URL: [email or web address] Date: 10/21/2021 Prepared by: Kearney Hard  Exercises - Seated Assisted Cervical Rotation with Towel  - 2 x daily - 7 x weekly - 5 reps - 10 seconds hold -  Seated Upper Trap Stretch  - 2 x daily - 7 x weekly - 5 reps - 10 seconds hold - Mid-Lower Cervical Extension SNAG with Strap  - 2 x daily - 7 x weekly - 5 reps - 5 seconds hold - Standing Row with Anchored Resistance  - 2 x daily - 7 x weekly - 10-15 reps - 3 seconds hold  ASSESSMENT:  CLINICAL IMPRESSION: Patient is a 88 y.o. who comes to clinic with complaints of neck pain with mobility, strength and movement coordination deficits that impair their ability to perform usual daily and recreational functional activities without increase difficulty/symptoms at this time.  Pt has had PT in the past with manual therapy and E-stim and reports good response. Patient to benefit from skilled PT services to address impairments and limitations to improve to previous level of function without restriction secondary to condition.     OBJECTIVE IMPAIRMENTS decreased ROM, impaired flexibility, impaired UE functional use, and pain.   ACTIVITY LIMITATIONS sleeping, dressing, and reach over head  PARTICIPATION LIMITATIONS: community activity  PERSONAL FACTORS Age and 3+ comorbidities: see pertinent history  are  also affecting patient's functional outcome.   REHAB POTENTIAL: fair to good  CLINICAL DECISION MAKING: Stable/uncomplicated  EVALUATION COMPLEXITY: Low   GOALS: Goals reviewed with patient? Yes  Short term PT Goals (target date for Short term goals are 3 weeks 11/15/21) Patient will demonstrate independent use of home exercise program to maintain progress from in clinic treatments. Goal status: New   Long term PT goals (target dates for all long term goals are 7 weeks  12/13/21 )   1. Patient will demonstrate/report pain at worst less than or equal to 2/10 to facilitate minimal limitation in daily activity secondary to pain symptoms. Goal status: New   2. Patient will demonstrate independent use of home exercise program to facilitate ability to maintain/progress functional gains from skilled physical therapy services. Goal status: New   3. Patient will demonstrate FOTO outcome > or = 60 % to indicate reduced disability due to condition. Goal status: New   4.  Patient will demonstrate cervical AROM WFL s symptoms to facilitate usual head movements for daily activity including driving, self care.   Goal status: New   5.  Pt will improved bilateral cervical rotation to >/= 50 degrees for improved functional mobility .    Goal status: New       PLAN: PT FREQUENCY: 1-2x/week  PT DURATION: 10 weeks  PLANNED INTERVENTIONS:  Therapeutic exercises, Therapeutic activity, Neuro Muscular re-education, Balance training, Gait training, Patient/Family education, Joint mobilization, Stair training, DME instructions, Dry Needling, Electrical stimulation, Cryotherapy, Traction Moist heat, Taping, Ultrasound, Ionotophoresis '4mg'$ /ml Dexamethasone, and Manual therapy.  All included unless contraindicated  PLAN FOR NEXT SESSION: Perform functional test (TUG), manual STM, mild cervical manual traction, E-stim, review cervical stretches, pulleys    Oretha Caprice, PT MPT 10/21/2021, 4:50  PM

## 2021-10-28 ENCOUNTER — Ambulatory Visit: Payer: PPO | Admitting: Physical Therapy

## 2021-10-28 ENCOUNTER — Encounter: Payer: Self-pay | Admitting: Physical Therapy

## 2021-10-28 DIAGNOSIS — M6281 Muscle weakness (generalized): Secondary | ICD-10-CM | POA: Diagnosis not present

## 2021-10-28 DIAGNOSIS — M542 Cervicalgia: Secondary | ICD-10-CM | POA: Diagnosis not present

## 2021-10-28 NOTE — Therapy (Signed)
OUTPATIENT PHYSICAL THERAPY TREATMENT NOTE   Patient Name: Olivia Evans MRN: [id number] DOB:[birth year], 88 y.o., female Today's Date: 10/28/2021  PCP: [name], MD REFERRING PROVIDER: Persons, Bevely Palmer, [place]  END OF SESSION:   PT End of Session - 10/28/21 1650     Visit Number 2    Number of Visits 6    Date for PT Re-Evaluation 12/13/21    PT Start Time 1430    PT Stop Time 1514    PT Time Calculation (min) 44 min    Activity Tolerance Patient tolerated treatment well    Behavior During Therapy [facility] for tasks assessed/performed             Past Medical History:  Diagnosis Date   Arthritis KNEES   Bladder pain    Chronic constipation    Diverticulosis 2013   Dr Fuller Plan   Dyslipidemia    Heart murmur    Hemorrhoids    History of adenomatous polyp of colon    2003 -- VILLOUS   History of breast cancer 2001---RIGHT BREAST CANCER   S/P PARTIAL MASTECTOMY AND RADIATION--  NO RECURRENCE   History of palpitations    Incomplete right bundle branch block (RBBB)    Interstitial cystitis    Dr McDiarmid   Lower urinary tract symptoms (LUTS)    Normal cardiac stress test    2000  PER MD NOTE   Osteopenia    Wears dentures    full -upper/  partial lower   Wears glasses    Past Surgical History:  Procedure Laterality Date   BENIGN TUMOR REMOVED  1960'S   SECOND RIB   CATARACT EXTRACTION W/ INTRAOCULAR LENS  IMPLANT, BILATERAL  feb 2016   CYSTO WITH HYDRODISTENSION  07/29/2011   Procedure: CYSTOSCOPY/HYDRODISTENSION;  Surgeon: Reece Packer, MD;  Location: [place];  Service: Urology;  Laterality: N/A;  with fulguration of bladder ulcer   CYSTO WITH HYDRODISTENSION N/A 08/28/2014   Procedure: CYSTO/HYDRODISTENSION, FULGURATION OF ULCERS;  Surgeon: Bjorn Loser, MD;  Location: [place];  Service: Urology;  Laterality: N/A;   CYSTO/  HYDRODISTENTION/  INSTILLATION THERAPY  x2  01-18- and 11-15- 2011   CYSTO/ HOD/  BLADDER  BX'S/  FULGERATION HUNNER ULCER'S /  INSTILLATION THERAPY  10-01-2010   PARTIAL MASTECTOMY W/ SLN DISSECTION Right 12-19-1999   ROTATOR CUFF REPAIR     TUBAL LIGATION     VAGINAL HYSTERECTOMY  1970'S   WITH APPENDECTOMY   VARICOSE VEIN SURGERY     Patient Active Problem List   Diagnosis Date Noted   Neck pain 10/07/2021   Occipital neuralgia of left side 10/04/2021   Decreased hearing of both ears 03/08/2021   Ear pain, right 03/08/2021   Bicytopenia 10/23/2020   History of breast cancer on right 10/16/2020   Anemia 11/28/2018   Prediabetes 10/13/2018   Bilateral leg edema 09/12/2017   Bilateral primary osteoarthritis of knee 12/22/2016   SOB (shortness of breath) on exertion 10/15/2016   Osteoporosis 03/26/2016   Diverticulosis of colon without hemorrhage 11/15/2013   Vitamin D deficiency 06/30/2012   INTERSTITIAL CYSTITIS 10/09/2009   Constipation 12/13/2007   History of colonic polyps 12/13/2007   SYNCOPE 06/24/2006    REFERRING DIAG:  M54.2 Neck pain M54.81 occipital neuralgia  THERAPY DIAG:  Cervicalgia  Muscle weakness (generalized)  Rationale for Evaluation and Treatment Rehabilitation  PERTINENT HISTORY: arthritis, diverticulosis, heart murmur, breast CA, mastectomy c radiation 2001, osteopenia, rotator cuff repair, hysterectomy,  caricose vein surgery  PRECAUTIONS: none  SUBJECTIVE: Pt arriving today reporting trying to perform her HEP at least once day.   PAIN:  Are you having pain? Yes: NPRS scale: 3/10 Pain location: neck Pain description: achy, soreness Aggravating factors: siting, sleeping Relieving factors: heat   OBJECTIVE: (objective measures completed at initial evaluation unless otherwise dated)  PATIENT SURVEYS:    10/21/21: FOTO intake:  53% (predicted:  60%)     COGNITION:   Overall cognitive status: Within functional limits for tasks assessed   SENSATION:   WFL   POSTURE:   rounded shoulders and forward head, kyphotic  thoracici spine   PALPATION:   TTP Rt upper trap, Rt levator, Rt infraspinatus      CERVICAL ROM:    Active ROM AROM (deg)    Flexion 45  Extension 22  Right lateral flexion 10  Left lateral flexion 20  Right rotation 34  Left rotation 46   (Blank rows = not tested)   UPPER EXTREMITY ROM:   Active ROM Right   Left    Shoulder flexion [facility] WFL  Shoulder extension [facility]  Shoulder abduction      Shoulder adduction      Shoulder extension      Shoulder internal rotation      Shoulder external rotation      Elbow flexion      Elbow extension      Wrist flexion      Wrist extension      Wrist ulnar deviation      Wrist radial deviation      Wrist pronation      Wrist supination       (Blank rows = not tested)   UPPER EXTREMITY MMT:   MMT Right   Left    Shoulder flexion 4-/5 4-/5  Shoulder extension 4/5 4/5  Shoulder abduction 4/5 4/5  Shoulder adduction      Shoulder extension      Shoulder internal rotation      Shoulder external rotation      Middle trapezius      Lower trapezius      Elbow flexion      Elbow extension      Wrist flexion      Wrist extension      Wrist ulnar deviation      Wrist radial deviation      Wrist pronation      Wrist supination      Grip strength       (Blank rows = not tested)         FUNCTIONAL TESTS:  10/21/21: TUG: to be performed at next visit       TODAY'S TREATMENT:  10/28/21:  TherEx:  Nustep 8 minutes level 4 Door stretch: x 4 holding 20 seconds Seated rows Level 3 band x 15 holding 3 sec Supine shoulder flexion with 1 # bar x 10 Cervical retraction x 10 holding 5 sec Manual:  Gentle manual cervical traction  PROM to cervical spine in supine Modalities:  Moist heat x 5 minutes  10/21/21:             Therex:    HEP instruction/performance c cues for techniques, handout provided.  Trial set performed of each for comprehension and symptom assessment.  See below for exercise list       PATIENT  EDUCATION:    Education details: HEP, POC Person educated: Patient Education method: Explanation, Demonstration, Verbal cues, and  Handouts Education comprehension: verbalized understanding, returned demonstration, and verbal cues required       HOME EXERCISE PROGRAM: Access Code: [email or web address] URL: [email or web address] Date: 10/21/2021 Prepared by: Kearney Hard   Exercises - Seated Assisted Cervical Rotation with Towel  - 2 x daily - 7 x weekly - 5 reps - 10 seconds hold - Seated Upper Trap Stretch  - 2 x daily - 7 x weekly - 5 reps - 10 seconds hold - Mid-Lower Cervical Extension SNAG with Strap  - 2 x daily - 7 x weekly - 5 reps - 5 seconds hold - Standing Row with Anchored Resistance  - 2 x daily - 7 x weekly - 10-15 reps - 3 seconds hold   ASSESSMENT:   CLINICAL IMPRESSION: Pt arriving to therapy reporting pain in her cervical spine. Pt stating she has been doing her best to perform her HEP at least once daily. Pt tolerating exercises well. Pt with good response to PROM and manual traction of cervical spine. Continue skilled PT to maximize pt's function.      OBJECTIVE IMPAIRMENTS decreased ROM, impaired flexibility, impaired UE functional use, and pain.    ACTIVITY LIMITATIONS sleeping, dressing, and reach over head   PARTICIPATION LIMITATIONS: community activity   PERSONAL FACTORS Age and 3+ comorbidities: see pertinent history  are also affecting patient's functional outcome.    REHAB POTENTIAL: fair to good   CLINICAL DECISION MAKING: Stable/uncomplicated   EVALUATION COMPLEXITY: Low     GOALS: Goals reviewed with patient? Yes   Short term PT Goals (target date for Short term goals are 3 weeks 11/15/21) Patient will demonstrate independent use of home exercise program to maintain progress from in clinic treatments. Goal status: ongoing 10/28/21   Long term PT goals (target dates for all long term goals are 7 weeks  12/13/21 )   1. Patient will  demonstrate/report pain at worst less than or equal to 2/10 to facilitate minimal limitation in daily activity secondary to pain symptoms. Goal status: New   2. Patient will demonstrate independent use of home exercise program to facilitate ability to maintain/progress functional gains from skilled physical therapy services. Goal status: New   3. Patient will demonstrate FOTO outcome > or = 60 % to indicate reduced disability due to condition. Goal status: New   4.  Patient will demonstrate cervical AROM WFL s symptoms to facilitate usual head movements for daily activity including driving, self care.    Goal status: New   5.  Pt will improved bilateral cervical rotation to >/= 50 degrees for improved functional mobility .    Goal status: New           PLAN: PT FREQUENCY: 1-2x/week   PT DURATION: 10 weeks   PLANNED INTERVENTIONS:  Therapeutic exercises, Therapeutic activity, Neuro Muscular re-education, Balance training, Gait training, Patient/Family education, Joint mobilization, Stair training, DME instructions, Dry Needling, Electrical stimulation, Cryotherapy, Traction Moist heat, Taping, Ultrasound, Ionotophoresis '4mg'$ /ml Dexamethasone, and Manual therapy.  All included unless contraindicated   PLAN FOR NEXT SESSION: manual STM, mild cervical manual traction, E-stim, review cervical stretches, pulleys       Oretha Caprice, PT 10/28/2021, 4:52 PM

## 2021-11-04 ENCOUNTER — Encounter: Payer: Self-pay | Admitting: Physical Therapy

## 2021-11-04 ENCOUNTER — Ambulatory Visit: Payer: PPO | Admitting: Physical Therapy

## 2021-11-04 DIAGNOSIS — M542 Cervicalgia: Secondary | ICD-10-CM | POA: Diagnosis not present

## 2021-11-04 DIAGNOSIS — M6281 Muscle weakness (generalized): Secondary | ICD-10-CM | POA: Diagnosis not present

## 2021-11-04 NOTE — Therapy (Signed)
OUTPATIENT PHYSICAL THERAPY TREATMENT NOTE   Patient Name: Olivia Evans MRN: [id number] DOB:1926/07/27, 88 y.o., female Today's Date: 11/04/2021  PCP: [name], MD REFERRING PROVIDER: Persons, Bevely Palmer, [place]  END OF SESSION:   PT End of Session - 11/04/21 1423     Visit Number 3    Number of Visits 6    Date for PT Re-Evaluation 12/13/21    PT Start Time 1420    PT Stop Time 1500    PT Time Calculation (min) 40 min    Activity Tolerance Patient tolerated treatment well    Behavior During Therapy [facility] for tasks assessed/performed              Past Medical History:  Diagnosis Date   Arthritis KNEES   Bladder pain    Chronic constipation    Diverticulosis 2013   Dr Fuller Plan   Dyslipidemia    Heart murmur    Hemorrhoids    History of adenomatous polyp of colon    2003 -- VILLOUS   History of breast cancer 2001---RIGHT BREAST CANCER   S/P PARTIAL MASTECTOMY AND RADIATION--  NO RECURRENCE   History of palpitations    Incomplete right bundle branch block (RBBB)    Interstitial cystitis    Dr McDiarmid   Lower urinary tract symptoms (LUTS)    Normal cardiac stress test    2000  PER MD NOTE   Osteopenia    Wears dentures    full -upper/  partial lower   Wears glasses    Past Surgical History:  Procedure Laterality Date   BENIGN TUMOR REMOVED  1960'S   SECOND RIB   CATARACT EXTRACTION W/ INTRAOCULAR LENS  IMPLANT, BILATERAL  feb 2016   CYSTO WITH HYDRODISTENSION  07/29/2011   Procedure: CYSTOSCOPY/HYDRODISTENSION;  Surgeon: Reece Packer, MD;  Location: [place];  Service: Urology;  Laterality: N/A;  with fulguration of bladder ulcer   CYSTO WITH HYDRODISTENSION N/A 08/28/2014   Procedure: CYSTO/HYDRODISTENSION, FULGURATION OF ULCERS;  Surgeon: Bjorn Loser, MD;  Location: [place];  Service: Urology;  Laterality: N/A;   CYSTO/  HYDRODISTENTION/  INSTILLATION THERAPY  x2  01-18- and 11-15- 2011   CYSTO/ HOD/  BLADDER  BX'S/  FULGERATION HUNNER ULCER'S /  INSTILLATION THERAPY  10-01-2010   PARTIAL MASTECTOMY W/ SLN DISSECTION Right 12-19-1999   ROTATOR CUFF REPAIR     TUBAL LIGATION     VAGINAL HYSTERECTOMY  1970'S   WITH APPENDECTOMY   VARICOSE VEIN SURGERY     Patient Active Problem List   Diagnosis Date Noted   Neck pain 10/07/2021   Occipital neuralgia of left side 10/04/2021   Decreased hearing of both ears 03/08/2021   Ear pain, right 03/08/2021   Bicytopenia 10/23/2020   History of breast cancer on right 10/16/2020   Anemia 11/28/2018   Prediabetes 10/13/2018   Bilateral leg edema 09/12/2017   Bilateral primary osteoarthritis of knee 12/22/2016   SOB (shortness of breath) on exertion 10/15/2016   Osteoporosis 03/26/2016   Diverticulosis of colon without hemorrhage 11/15/2013   Vitamin D deficiency 06/30/2012   INTERSTITIAL CYSTITIS 10/09/2009   Constipation 12/13/2007   History of colonic polyps 12/13/2007   SYNCOPE 06/24/2006    REFERRING DIAG:  M54.2 Neck pain M54.81 occipital neuralgia  THERAPY DIAG:  Cervicalgia  Muscle weakness (generalized)  Rationale for Evaluation and Treatment Rehabilitation  PERTINENT HISTORY: arthritis, diverticulosis, heart murmur, breast CA, mastectomy c radiation 2001, osteopenia, rotator cuff repair,  hysterectomy, caricose vein surgery  PRECAUTIONS: none  SUBJECTIVE: Pt arriving today reporting 1-2/10 in her right side neck.   PAIN:  Are you having pain? Yes: NPRS scale: 1-2/10 Pain location: neck Pain description: achy, soreness Aggravating factors: siting, sleeping Relieving factors: heat   OBJECTIVE: (objective measures completed at initial evaluation unless otherwise dated)  PATIENT SURVEYS:    10/21/21: FOTO intake:  53% (predicted:  60%)     COGNITION:   Overall cognitive status: Within functional limits for tasks assessed   SENSATION:   WFL   POSTURE:   rounded shoulders and forward head, kyphotic thoracici spine    PALPATION:   TTP Rt upper trap, Rt levator, Rt infraspinatus      CERVICAL ROM:    Active ROM AROM (deg)   AROM 11/04/21  Flexion 45   Extension 22   Right lateral flexion 10 14  Left lateral flexion 20 22  Right rotation 34 38  Left rotation 46 46   (Blank rows = not tested)   UPPER EXTREMITY ROM:   Active ROM Right   Left    Shoulder flexion [facility] WFL  Shoulder extension [facility]  Shoulder abduction      Shoulder adduction      Shoulder extension      Shoulder internal rotation      Shoulder external rotation      Elbow flexion      Elbow extension      Wrist flexion      Wrist extension      Wrist ulnar deviation      Wrist radial deviation      Wrist pronation      Wrist supination       (Blank rows = not tested)   UPPER EXTREMITY MMT:   MMT Right   Left    Shoulder flexion 4-/5 4-/5  Shoulder extension 4/5 4/5  Shoulder abduction 4/5 4/5  Shoulder adduction      Shoulder extension      Shoulder internal rotation      Shoulder external rotation      Middle trapezius      Lower trapezius      Elbow flexion      Elbow extension      Wrist flexion      Wrist extension      Wrist ulnar deviation      Wrist radial deviation      Wrist pronation      Wrist supination      Grip strength       (Blank rows = not tested)         FUNCTIONAL TESTS:  11/04/21: TUG: 12 sec using bil UE for push off 10/21/21: TUG: to be performed at next visit        TODAY'S TREATMENT:  11/04/21:  TherEx:  Nustep 6 minutes level 4 Standing rows Level 3 band x 15 holding 3 sec Seated shoulder flexion with 1 # bar x 10 Seated chest press with 1# bar x 10 Cervical retraction x 10 holding 5 sec Cervical rotation x 3 holding 10 sec Cervical side bending x 2 each side holding 10 sec Manual:  Gentle manual cervical traction  PROM to cervical spine in supine Modalities:  E-stim: Pre-modulated to Rt cervical paraspinals and upper trap, intensity to tolerance,  Moist  heat x 10 minutes   10/28/21:  TherEx:  Nustep 8 minutes level 4 Door stretch: x 4 holding 20 seconds Seated rows  Level 3 band x 15 holding 3 sec Supine shoulder flexion with 1 # bar x 10 Cervical retraction x 10 holding 5 sec Manual:  Gentle manual cervical traction  PROM to cervical spine in supine Modalities:  Moist heat x 5 minutes  10/21/21:             Therex:    HEP instruction/performance c cues for techniques, handout provided.  Trial set performed of each for comprehension and symptom assessment.  See below for exercise list       PATIENT EDUCATION:    Education details: HEP, POC Person educated: Patient Education method: Explanation, Demonstration, Verbal cues, and Handouts Education comprehension: verbalized understanding, returned demonstration, and verbal cues required       HOME EXERCISE PROGRAM: Access Code: [email or web address] URL: [email or web address] Date: 10/21/2021 Prepared by: Kearney Hard   Exercises - Seated Assisted Cervical Rotation with Towel  - 2 x daily - 7 x weekly - 5 reps - 10 seconds hold - Seated Upper Trap Stretch  - 2 x daily - 7 x weekly - 5 reps - 10 seconds hold - Mid-Lower Cervical Extension SNAG with Strap  - 2 x daily - 7 x weekly - 5 reps - 5 seconds hold - Standing Row with Anchored Resistance  - 2 x daily - 7 x weekly - 10-15 reps - 3 seconds hold   ASSESSMENT:   CLINICAL IMPRESSION: Pt tolerating exercises well still reporting tightness and soreness in her Rt cervical spine with tenderness noted in Rt upper trap and levator. Pt reporting decreased pain and stiffness following E-stim and moist heat to cervical spine. Continue skilled PT to maximize pt's function.      OBJECTIVE IMPAIRMENTS decreased ROM, impaired flexibility, impaired UE functional use, and pain.    ACTIVITY LIMITATIONS sleeping, dressing, and reach over head   PARTICIPATION LIMITATIONS: community activity   PERSONAL FACTORS Age and 3+  comorbidities: see pertinent history  are also affecting patient's functional outcome.    REHAB POTENTIAL: fair to good   CLINICAL DECISION MAKING: Stable/uncomplicated   EVALUATION COMPLEXITY: Low     GOALS: Goals reviewed with patient? Yes   Short term PT Goals (target date for Short term goals are 3 weeks 11/15/21) Patient will demonstrate independent use of home exercise program to maintain progress from in clinic treatments. Goal status: MET 11/04/21   Long term PT goals (target dates for all long term goals are 7 weeks  12/13/21 )   1. Patient will demonstrate/report pain at worst less than or equal to 2/10 to facilitate minimal limitation in daily activity secondary to pain symptoms. Goal status: New   2. Patient will demonstrate independent use of home exercise program to facilitate ability to maintain/progress functional gains from skilled physical therapy services. Goal status: New   3. Patient will demonstrate FOTO outcome > or = 60 % to indicate reduced disability due to condition. Goal status: New   4.  Patient will demonstrate cervical AROM WFL s symptoms to facilitate usual head movements for daily activity including driving, self care.    Goal status: New   5.  Pt will improved bilateral cervical rotation to >/= 50 degrees for improved functional mobility .    Goal status: New           PLAN: PT FREQUENCY: 1-2x/week   PT DURATION: 10 weeks   PLANNED INTERVENTIONS:  Therapeutic exercises, Therapeutic activity, Neuro Muscular re-education, Balance training, Gait training, Patient/Family education, Joint mobilization, Stair training, DME  instructions, Dry Needling, Electrical stimulation, Cryotherapy, Traction Moist heat, Taping, Ultrasound, Ionotophoresis 63m/ml Dexamethasone, and Manual therapy.  All included unless contraindicated   PLAN FOR NEXT SESSION: manual STM, mild cervical manual traction, assess response to E-stim, pulleys       JOretha Caprice PT, MPT 11/04/2021, 3:03 PM

## 2021-11-05 ENCOUNTER — Encounter: Payer: Self-pay | Admitting: Internal Medicine

## 2021-11-05 DIAGNOSIS — D469 Myelodysplastic syndrome, unspecified: Secondary | ICD-10-CM | POA: Insufficient documentation

## 2021-11-05 NOTE — Progress Notes (Unsigned)
Subjective:    Patient ID: Olivia Evans, female    DOB: Oct 31, 1926, 88 y.o.   MRN: [id number]      HPI Olivia Evans is here for a Physical exam.  She is here with her daughter.  Saw her 9/15 for new onset occipital neuralgia - saw ortho.  Has tried gabapentin and tramadol.  The gabapentin helped - she no longer has the left sided head pain and the left sided neck pain is better.  She now has right sided neck pain that is related to travel - ? Muscular.  No radiation up to head.  No headaches.  Not taking gabapentin now.      Medications and allergies reviewed with patient and updated if appropriate.  Current Outpatient Medications on File Prior to Visit  Medication Sig Dispense Refill   acetaminophen (TYLENOL) 325 MG tablet Take 325 mg by mouth every 8 (eight) hours as needed for moderate pain.     Apoaequorin (PREVAGEN PO) Take by mouth.     Cholecalciferol (VITAMIN D3) 1000 UNITS CAPS Take 4,000 Units by mouth daily.     Ferrous Sulfate (IRON PO) Take 1 tablet by mouth 2 (two) times daily. Patient takes Mega-Food Blood Builder Iron Minis     furosemide (LASIX) 20 MG tablet Take 1 tablet (20 mg total) by mouth daily. 30 tablet 5   gabapentin (NEURONTIN) 100 MG capsule Take 1 capsule (100 mg total) by mouth 2 (two) times daily. 60 capsule 3   Multiple Vitamins-Minerals (PRESERVISION AREDS 2 PO) Take 1 capsule by mouth in the morning and at bedtime.     OVER THE COUNTER MEDICATION Take 1 tablet by mouth at bedtime as needed (laxitive). PruneLax     Polyethyl Glycol-Propyl Glycol (SYSTANE OP) Place 1 drop into both eyes daily. To both eyes.     Pumpkin Seed-Soy Germ (AZO BLADDER CONTROL/GO-LESS) CAPS Take 1 capsule by mouth daily.     No current facility-administered medications on file prior to visit.    Review of Systems  Constitutional:  Negative for fever.  Eyes:  Positive for visual disturbance (occ blurry vision at night when watching tv).  Respiratory:  Positive for cough  (allergy related). Negative for shortness of breath and wheezing.   Cardiovascular:  Positive for leg swelling (occ). Negative for chest pain and palpitations.  Gastrointestinal:  Positive for constipation. Negative for abdominal pain, blood in stool, diarrhea and nausea.       No gerd  Genitourinary:  Negative for dysuria.  Musculoskeletal:  Positive for back pain (mild OA) and neck pain. Negative for arthralgias.  Skin:  Negative for rash.  Neurological:  Negative for light-headedness and headaches.  Psychiatric/Behavioral:  Negative for dysphoric mood. The patient is not nervous/anxious.        Objective:   Vitals:   11/06/21 1345  BP: 120/60  Pulse: 72  Temp: 97.7 F (36.5 C)  SpO2: 97%   Filed Weights   11/06/21 1345  Weight: 129 lb (58.5 kg)   Body mass index is 21.47 kg/m.  BP Readings from Last 3 Encounters:  11/06/21 120/60  10/04/21 120/72  08/27/21 (!) 157/61    Wt Readings from Last 3 Encounters:  11/06/21 129 lb (58.5 kg)  10/04/21 124 lb (56.2 kg)  08/27/21 128 lb 14.4 oz (58.5 kg)       Physical Exam Constitutional: She appears well-developed and well-nourished. No distress.  HENT:  Head: Normocephalic and atraumatic.  Right Ear: External ear normal. Normal  ear canal and TM Left Ear: External ear normal.  Normal ear canal and TM Mouth/Throat: Oropharynx is clear and moist.  Eyes: Conjunctivae normal.  Neck: Neck supple. No tracheal deviation present. No thyromegaly present.  No carotid bruit  Cardiovascular: Normal rate, regular rhythm and normal heart sounds.   2/6 right sternal border systolic murmur heard.  No edema. Pulmonary/Chest: Effort normal and breath sounds normal. No respiratory distress. She has no wheezes. She has no rales.  Breast: deferred   Abdominal: Soft. She exhibits no distension. There is no tenderness.  Lymphadenopathy: She has no cervical adenopathy.  Skin: Skin is warm and dry. She is not diaphoretic.  Psychiatric:  She has a normal mood and affect. Her behavior is normal.     Lab Results  Component Value Date   WBC 3.2 (L) 08/27/2021   HGB 9.3 (L) 08/27/2021   HCT 27.1 (L) 08/27/2021   PLT 243 08/27/2021   GLUCOSE 86 11/06/2021   CHOL 147 11/06/2021   TRIG 69.0 11/06/2021   HDL 67.50 11/06/2021   LDLDIRECT 104.5 04/01/2011   LDLCALC 66 11/06/2021   ALT 9 11/06/2021   AST 12 11/06/2021   NA 140 11/06/2021   K 3.9 11/06/2021   CL 105 11/06/2021   CREATININE 0.69 11/06/2021   BUN 15 11/06/2021   CO2 31 11/06/2021   TSH 0.83 11/06/2021   HGBA1C 5.8 11/06/2021         Assessment & Plan:   Physical exam: Screening blood work  ordered Exercise  very active Weight  normal Substance abuse  none   Reviewed recommended immunizations.   Health Maintenance  Topic Date Due   COVID-19 Vaccine (3 - Moderna risk series) 11/22/2021 (Originally 10/17/2019)   Zoster Vaccines- Shingrix (1 of 2) 02/06/2022 (Originally 02/26/1945)   TETANUS/TDAP  11/07/2022 (Originally 07/20/2017)   DEXA SCAN  10/12/2028 (Originally 05/28/2013)   Pneumonia Vaccine 5+ Years old  Completed   INFLUENZA VACCINE  Completed   HPV VACCINES  Aged Out          See Problem List for Assessment and Plan of chronic medical problems.

## 2021-11-05 NOTE — Patient Instructions (Addendum)
Blood work was ordered.   The lab is on the first floor.    Medications changes include :   none  Echo ordered for [place] [facility] to evaluate your heart murmur.    Return in about 1 year (around 11/07/2022) for Physical Exam, Schedule DEXA-[place].     Health Maintenance, Female Adopting a healthy lifestyle and getting preventive care are important in promoting health and wellness. Ask your health care provider about: The right schedule for you to have regular tests and exams. Things you can do on your own to prevent diseases and keep yourself healthy. What should I know about diet, weight, and exercise? Eat a healthy diet  Eat a diet that includes plenty of vegetables, fruits, low-fat dairy products, and lean protein. Do not eat a lot of foods that are high in solid fats, added sugars, or sodium. Maintain a healthy weight Body mass index (BMI) is used to identify weight problems. It estimates body fat based on height and weight. Your health care provider can help determine your BMI and help you achieve or maintain a healthy weight. Get regular exercise Get regular exercise. This is one of the most important things you can do for your health. Most adults should: Exercise for at least 150 minutes each week. The exercise should increase your heart rate and make you sweat (moderate-intensity exercise). Do strengthening exercises at least twice a week. This is in addition to the moderate-intensity exercise. Spend less time sitting. Even light physical activity can be beneficial. Watch cholesterol and blood lipids Have your blood tested for lipids and cholesterol at 86 years of age, then have this test every 5 years. Have your cholesterol levels checked more often if: Your lipid or cholesterol levels are high. You are older than 86 years of age. You are at high risk for heart disease. What should I know about cancer screening? Depending on your health history and family  history, you may need to have cancer screening at various ages. This may include screening for: Breast cancer. Cervical cancer. Colorectal cancer. Skin cancer. Lung cancer. What should I know about heart disease, diabetes, and high blood pressure? Blood pressure and heart disease High blood pressure causes heart disease and increases the risk of stroke. This is more likely to develop in people who have high blood pressure readings or are overweight. Have your blood pressure checked: Every 3-5 years if you are 19-25 years of age. Every year if you are 69 years old or older. Diabetes Have regular diabetes screenings. This checks your fasting blood sugar level. Have the screening done: Once every three years after age 16 if you are at a normal weight and have a low risk for diabetes. More often and at a younger age if you are overweight or have a high risk for diabetes. What should I know about preventing infection? Hepatitis B If you have a higher risk for hepatitis B, you should be screened for this virus. Talk with your health care provider to find out if you are at risk for hepatitis B infection. Hepatitis C Testing is recommended for: Everyone born from [birth year] through 1963. Anyone with known risk factors for hepatitis C. Sexually transmitted infections (STIs) Get screened for STIs, including gonorrhea and chlamydia, if: You are sexually active and are younger than 86 years of age. You are older than 86 years of age and your health care provider tells you that you are at risk for this type of  infection. Your sexual activity has changed since you were last screened, and you are at increased risk for chlamydia or gonorrhea. Ask your health care provider if you are at risk. Ask your health care provider about whether you are at high risk for HIV. Your health care provider may recommend a prescription medicine to help prevent HIV infection. If you choose to take medicine to prevent HIV, you  should first get tested for HIV. You should then be tested every 3 months for as long as you are taking the medicine. Pregnancy If you are about to stop having your period (premenopausal) and you may become pregnant, seek counseling before you get pregnant. Take 400 to 800 micrograms (mcg) of folic acid every day if you become pregnant. Ask for birth control (contraception) if you want to prevent pregnancy. Osteoporosis and menopause Osteoporosis is a disease in which the bones lose minerals and strength with aging. This can result in bone fractures. If you are 37 years old or older, or if you are at risk for osteoporosis and fractures, ask your health care provider if you should: Be screened for bone loss. Take a calcium or vitamin D supplement to lower your risk of fractures. Be given hormone replacement therapy (HRT) to treat symptoms of menopause. Follow these instructions at home: Alcohol use Do not drink alcohol if: Your health care provider tells you not to drink. You are pregnant, may be pregnant, or are planning to become pregnant. If you drink alcohol: Limit how much you have to: 0-1 drink a day. Know how much alcohol is in your drink. In the [place] one drink equals one 12 oz bottle of beer (355 mL), one 5 oz glass of wine (148 mL), or one 1 oz glass of hard liquor (44 mL). Lifestyle Do not use any products that contain nicotine or tobacco. These products include cigarettes, chewing tobacco, and vaping devices, such as e-cigarettes. If you need help quitting, ask your health care provider. Do not use street drugs. Do not share needles. Ask your health care provider for help if you need support or information about quitting drugs. General instructions Schedule regular health, dental, and eye exams. Stay current with your vaccines. Tell your health care provider if: You often feel depressed. You have ever been abused or do not feel safe at home. Summary Adopting a healthy  lifestyle and getting preventive care are important in promoting health and wellness. Follow your health care provider's instructions about healthy diet, exercising, and getting tested or screened for diseases. Follow your health care provider's instructions on monitoring your cholesterol and blood pressure. This information is not intended to replace advice given to you by your health care provider. Make sure you discuss any questions you have with your health care provider. Document Revised: 05/28/2020 Document Reviewed: 05/28/2020 Elsevier Patient Education  [employer].

## 2021-11-06 ENCOUNTER — Telehealth: Payer: Self-pay

## 2021-11-06 ENCOUNTER — Ambulatory Visit (INDEPENDENT_AMBULATORY_CARE_PROVIDER_SITE_OTHER): Payer: PPO | Admitting: Internal Medicine

## 2021-11-06 ENCOUNTER — Ambulatory Visit (INDEPENDENT_AMBULATORY_CARE_PROVIDER_SITE_OTHER)
Admission: RE | Admit: 2021-11-06 | Discharge: 2021-11-06 | Disposition: A | Discharge status: Home / Self Care | Payer: PPO | Source: Ambulatory Visit | Attending: Internal Medicine | Admitting: Internal Medicine

## 2021-11-06 VITALS — BP 120/60 | HR 72 | Temp 97.7°F | Ht 65.0 in | Wt 129.0 lb

## 2021-11-06 DIAGNOSIS — M5481 Occipital neuralgia: Secondary | ICD-10-CM | POA: Diagnosis not present

## 2021-11-06 DIAGNOSIS — E559 Vitamin D deficiency, unspecified: Secondary | ICD-10-CM

## 2021-11-06 DIAGNOSIS — Z0000 Encounter for general adult medical examination without abnormal findings: Secondary | ICD-10-CM | POA: Diagnosis not present

## 2021-11-06 DIAGNOSIS — R011 Cardiac murmur, unspecified: Secondary | ICD-10-CM

## 2021-11-06 DIAGNOSIS — R7303 Prediabetes: Secondary | ICD-10-CM

## 2021-11-06 DIAGNOSIS — D469 Myelodysplastic syndrome, unspecified: Secondary | ICD-10-CM | POA: Diagnosis not present

## 2021-11-06 DIAGNOSIS — R600 Localized edema: Secondary | ICD-10-CM

## 2021-11-06 DIAGNOSIS — M810 Age-related osteoporosis without current pathological fracture: Secondary | ICD-10-CM

## 2021-11-06 LAB — VITAMIN D 25 HYDROXY (VIT D DEFICIENCY, FRACTURES): VITD: 106.68 ng/mL (ref 30.00–100.00)

## 2021-11-06 LAB — LIPID PANEL
Cholesterol: 147 mg/dL (ref 0–200)
HDL: 67.5 mg/dL (ref >39.00)
LDL Cholesterol: 66 mg/dL (ref 0–99)
NonHDL: 79.82
Total CHOL/HDL Ratio: 2
Triglycerides: 69 mg/dL (ref 0.0–149.0)
VLDL: 13.8 mg/dL (ref 0.0–40.0)

## 2021-11-06 LAB — COMPREHENSIVE METABOLIC PANEL
ALT: 9 U/L (ref 0–35)
AST: 12 U/L (ref 0–37)
Albumin: 4.2 g/dL (ref 3.5–5.2)
Alkaline Phosphatase: 55 U/L (ref 39–117)
BUN: 15 mg/dL (ref 6–23)
CO2: 31 mEq/L (ref 19–32)
Calcium: 9.7 mg/dL (ref 8.4–10.5)
Chloride: 105 mEq/L (ref 96–112)
Creatinine, Ser: 0.69 mg/dL (ref 0.40–1.20)
GFR: 74.67 mL/min (ref >60.00)
Glucose, Bld: 86 mg/dL (ref 70–99)
Potassium: 3.9 mEq/L (ref 3.5–5.1)
Sodium: 140 mEq/L (ref 135–145)
Total Bilirubin: 0.6 mg/dL (ref 0.2–1.2)
Total Protein: 6.1 g/dL (ref 6.0–8.3)

## 2021-11-06 LAB — TSH: TSH: 0.83 u[IU]/mL (ref 0.35–5.50)

## 2021-11-06 LAB — HEMOGLOBIN A1C: Hgb A1c MFr Bld: 5.8 % (ref 4.6–6.5)

## 2021-11-06 NOTE — Assessment & Plan Note (Signed)
Chronic Check a1c Low sugar / carb diet Stressed regular exercise  

## 2021-11-06 NOTE — Assessment & Plan Note (Signed)
Chronic She has not wanted to have a DEXA scan, but agrees to have one now DEXA ordered Briefly reviewed possible treatments because most likely she will need to consider treatment Unable to take calcium secondary to severe constipation Continue vitamin D Check vitamin D level

## 2021-11-06 NOTE — Assessment & Plan Note (Signed)
Chronic Taking vitamin D daily Check vitamin D level  

## 2021-11-06 NOTE — Assessment & Plan Note (Signed)
Chronic Monitored by oncology

## 2021-11-06 NOTE — Telephone Encounter (Signed)
Vitamin D level is very high at 106.  Hold vitamin D for 1 month and then take vitamin D once daily.  Thyroid function, kidney function, liver tests are normal.  Sugar stable in prediabetic range.  Cholesterol is very good.

## 2021-11-06 NOTE — Assessment & Plan Note (Signed)
Chronic Controlled Continue furosemide 20 mg daily

## 2021-11-06 NOTE — Assessment & Plan Note (Signed)
Resolved No longer having symptoms-she states the gabapentin helped and she is no longer taking it Advised if symptoms recur that she can restart the gabapentin Does have an appointment with neurology in [place] will monitor for now and if her symptoms do recur she will cancel that appointment which I think is reasonable

## 2021-11-06 NOTE — Assessment & Plan Note (Signed)
2/6 right sternal border systolic murmur heard Will check echocardiogram

## 2021-11-07 NOTE — Telephone Encounter (Signed)
Spoke with patient today. 

## 2021-11-11 ENCOUNTER — Encounter: Payer: Self-pay | Admitting: Physical Therapy

## 2021-11-11 ENCOUNTER — Ambulatory Visit: Payer: PPO | Admitting: Physical Therapy

## 2021-11-11 DIAGNOSIS — M6281 Muscle weakness (generalized): Secondary | ICD-10-CM | POA: Diagnosis not present

## 2021-11-11 DIAGNOSIS — M542 Cervicalgia: Secondary | ICD-10-CM | POA: Diagnosis not present

## 2021-11-11 NOTE — Therapy (Signed)
OUTPATIENT PHYSICAL THERAPY TREATMENT NOTE   Patient Name: Olivia Evans MRN: [id number] DOB:1926-10-16, 88 y.o., female Today's Date: 11/11/2021  PCP: [name], MD REFERRING PROVIDER: Persons, Bevely Palmer, [place]  END OF SESSION:   PT End of Session - 11/11/21 1502     Visit Number 4    Number of Visits 6    Date for PT Re-Evaluation 12/13/21    PT Start Time 1432    PT Stop Time 1513    PT Time Calculation (min) 41 min    Activity Tolerance Patient tolerated treatment well    Behavior During Therapy [facility] for tasks assessed/performed               Past Medical History:  Diagnosis Date   Arthritis KNEES   Bladder pain    Chronic constipation    Diverticulosis 2013   Dr Fuller Plan   Dyslipidemia    Heart murmur    Hemorrhoids    History of adenomatous polyp of colon    2003 -- VILLOUS   History of breast cancer 2001---RIGHT BREAST CANCER   S/P PARTIAL MASTECTOMY AND RADIATION--  NO RECURRENCE   History of palpitations    Incomplete right bundle branch block (RBBB)    Interstitial cystitis    Dr McDiarmid   Lower urinary tract symptoms (LUTS)    Normal cardiac stress test    2000  PER MD NOTE   Osteopenia    Wears dentures    full -upper/  partial lower   Wears glasses    Past Surgical History:  Procedure Laterality Date   BENIGN TUMOR REMOVED  1960'S   SECOND RIB   CATARACT EXTRACTION W/ INTRAOCULAR LENS  IMPLANT, BILATERAL  feb 2016   CYSTO WITH HYDRODISTENSION  07/29/2011   Procedure: CYSTOSCOPY/HYDRODISTENSION;  Surgeon: Reece Packer, MD;  Location: [facility];  Service: Urology;  Laterality: N/A;  with fulguration of bladder ulcer   CYSTO WITH HYDRODISTENSION N/A 08/28/2014   Procedure: CYSTO/HYDRODISTENSION, FULGURATION OF ULCERS;  Surgeon: Bjorn Loser, MD;  Location: [facility];  Service: Urology;  Laterality: N/A;   CYSTO/  HYDRODISTENTION/  INSTILLATION THERAPY  x2  01-18- and 11-15- 2011   CYSTO/ HOD/   BLADDER BX'S/  FULGERATION HUNNER ULCER'S /  INSTILLATION THERAPY  10-01-2010   PARTIAL MASTECTOMY W/ SLN DISSECTION Right 12-19-1999   ROTATOR CUFF REPAIR     TUBAL LIGATION     VAGINAL HYSTERECTOMY  1970'S   WITH APPENDECTOMY   VARICOSE VEIN SURGERY     Patient Active Problem List   Diagnosis Date Noted   Murmur 11/06/2021   Myelodysplasia (myelodysplastic syndrome) ([place]) 11/05/2021   Neck pain 10/07/2021   Occipital neuralgia of left side 10/04/2021   Decreased hearing of both ears 03/08/2021   Bicytopenia 10/23/2020   History of breast cancer on right 10/16/2020   Anemia 11/28/2018   Prediabetes 10/13/2018   Bilateral leg edema 09/12/2017   Bilateral primary osteoarthritis of knee 12/22/2016   SOB (shortness of breath) on exertion 10/15/2016   Osteoporosis 03/26/2016   Diverticulosis of colon without hemorrhage 11/15/2013   Vitamin D deficiency 06/30/2012   INTERSTITIAL CYSTITIS 10/09/2009   Constipation 12/13/2007   History of colonic polyps 12/13/2007   SYNCOPE 06/24/2006    REFERRING DIAG:  M54.2 Neck pain M54.81 occipital neuralgia  THERAPY DIAG:  Cervicalgia  Muscle weakness (generalized)  Rationale for Evaluation and Treatment Rehabilitation  PERTINENT HISTORY: arthritis, diverticulosis, heart murmur, breast CA, mastectomy c  radiation 2001, osteopenia, rotator cuff repair, hysterectomy, caricose vein surgery  PRECAUTIONS: none  SUBJECTIVE: Pt arriving today reporting 1-2/10 in her right side neck.   PAIN:  Are you having pain? Yes: NPRS scale: 1-2/10 Pain location: neck Pain description: achy, soreness Aggravating factors: siting, sleeping Relieving factors: heat   OBJECTIVE: (objective measures completed at initial evaluation unless otherwise dated)  PATIENT SURVEYS:    10/21/21: FOTO intake:  53% (predicted:  60%)     COGNITION:   Overall cognitive status: Within functional limits for tasks assessed   SENSATION:   WFL   POSTURE:    rounded shoulders and forward head, kyphotic thoracici spine   PALPATION:   TTP Rt upper trap, Rt levator, Rt infraspinatus      CERVICAL ROM:    Active ROM AROM (deg)   AROM 11/04/21 AROM 11/11/21  Flexion 45    Extension 22    Right lateral flexion 10 14   Left lateral flexion 20 22   Right rotation 34 38 40  Left rotation 46 46 48   (Blank rows = not tested)   UPPER EXTREMITY ROM:   Active ROM Right   Left    Shoulder flexion [facility] WFL  Shoulder extension [facility]  Shoulder abduction      Shoulder adduction      Shoulder extension      Shoulder internal rotation      Shoulder external rotation      Elbow flexion      Elbow extension      Wrist flexion      Wrist extension      Wrist ulnar deviation      Wrist radial deviation      Wrist pronation      Wrist supination       (Blank rows = not tested)   UPPER EXTREMITY MMT:   MMT Right   Left    Shoulder flexion 4-/5 4-/5  Shoulder extension 4/5 4/5  Shoulder abduction 4/5 4/5  Shoulder adduction      Shoulder extension      Shoulder internal rotation      Shoulder external rotation      Middle trapezius      Lower trapezius      Elbow flexion      Elbow extension      Wrist flexion      Wrist extension      Wrist ulnar deviation      Wrist radial deviation      Wrist pronation      Wrist supination      Grip strength       (Blank rows = not tested)         FUNCTIONAL TESTS:  11/04/21: TUG: 12 sec using bil UE for push off 10/21/21: TUG: to be performed at next visit        TODAY'S TREATMENT:  11/11/21:  TherEx:  Nustep 6 minutes level 4 Standing rows Level 3 band x 15 holding 3 sec Standing shoulder flexion with 1 # bar x 10 Standing shoulder extension Level 2 band x 15 holding 3 sec Cervical rotation x 3 holding 10 sec Cervical side bending x 2 each side holding 10 sec Manual:  Gentle manual cervical traction  PROM to cervical spine in supine Modalities:  E-stim: Pre-modulated  to Rt cervical paraspinals and upper trap, intensity to tolerance,  Moist heat x 10 minutes   11/04/21:  TherEx:  Nustep 6 minutes level  4 Standing rows Level 3 band x 15 holding 3 sec Seated shoulder flexion with 1 # bar x 10 Seated chest press with 1# bar x 10 Cervical retraction x 10 holding 5 sec Cervical rotation x 3 holding 10 sec Cervical side bending x 2 each side holding 10 sec Manual:  Gentle manual cervical traction  PROM to cervical spine in supine Modalities:  E-stim: Pre-modulated to Rt cervical paraspinals and upper trap, intensity to tolerance,  Moist heat x 10 minutes   10/28/21:  TherEx:  Nustep 8 minutes level 4 Door stretch: x 4 holding 20 seconds Seated rows Level 3 band x 15 holding 3 sec Supine shoulder flexion with 1 # bar x 10 Cervical retraction x 10 holding 5 sec Manual:  Gentle manual cervical traction  PROM to cervical spine in supine Modalities:  Moist heat x 5 minutes         PATIENT EDUCATION:    Education details: HEP, POC Person educated: Patient Education method: Consulting civil engineer, Media planner, Verbal cues, and Handouts Education comprehension: verbalized understanding, returned demonstration, and verbal cues required       HOME EXERCISE PROGRAM: Access Code: [email or web address] URL: [email or web address] Date: 10/21/2021 Prepared by: Kearney Hard   Exercises - Seated Assisted Cervical Rotation with Towel  - 2 x daily - 7 x weekly - 5 reps - 10 seconds hold - Seated Upper Trap Stretch  - 2 x daily - 7 x weekly - 5 reps - 10 seconds hold - Mid-Lower Cervical Extension SNAG with Strap  - 2 x daily - 7 x weekly - 5 reps - 5 seconds hold - Standing Row with Anchored Resistance  - 2 x daily - 7 x weekly - 10-15 reps - 3 seconds hold   ASSESSMENT:   CLINICAL IMPRESSION: Pt still reporting pain in her cervical spine of 1-2/10 when turning her head. Pt tolerating exercises well. Pt was issued a handout with a recommendation of  a TENS unit for home use. Pt reporting no pain at end of session. Continue skilled PT to maximize pt's function and progress toward LTG's set.      OBJECTIVE IMPAIRMENTS decreased ROM, impaired flexibility, impaired UE functional use, and pain.    ACTIVITY LIMITATIONS sleeping, dressing, and reach over head   PARTICIPATION LIMITATIONS: community activity   PERSONAL FACTORS Age and 3+ comorbidities: see pertinent history  are also affecting patient's functional outcome.    REHAB POTENTIAL: fair to good   CLINICAL DECISION MAKING: Stable/uncomplicated   EVALUATION COMPLEXITY: Low     GOALS: Goals reviewed with patient? Yes   Short term PT Goals (target date for Short term goals are 3 weeks 11/15/21) Patient will demonstrate independent use of home exercise program to maintain progress from in clinic treatments. Goal status: MET 11/04/21   Long term PT goals (target dates for all long term goals are 7 weeks  12/13/21 )   1. Patient will demonstrate/report pain at worst less than or equal to 2/10 to facilitate minimal limitation in daily activity secondary to pain symptoms. Goal status: On-going 11/11/21   2. Patient will demonstrate independent use of home exercise program to facilitate ability to maintain/progress functional gains from skilled physical therapy services. Goal status: New   3. Patient will demonstrate FOTO outcome > or = 60 % to indicate reduced disability due to condition. Goal status: New   4.  Patient will demonstrate cervical AROM WFL s symptoms to facilitate usual head movements for daily activity including driving, self care.  Goal status: On-going 11/11/21   5.  Pt will improved bilateral cervical rotation to >/= 50 degrees for improved functional mobility .    Goal status: On-going 11/11/21           PLAN: PT FREQUENCY: 1-2x/week   PT DURATION: 10 weeks   PLANNED INTERVENTIONS:  Therapeutic exercises, Therapeutic activity, Neuro Muscular  re-education, Balance training, Gait training, Patient/Family education, Joint mobilization, Stair training, DME instructions, Dry Needling, Electrical stimulation, Cryotherapy, Traction Moist heat, Taping, Ultrasound, Ionotophoresis 19m/ml Dexamethasone, and Manual therapy.  All included unless contraindicated   PLAN FOR NEXT SESSION: manual STM, mild cervical manual traction, assess response to E-stim, pulleys       JOretha Caprice PT, MPT 11/11/2021, 3:06 PM

## 2021-11-15 ENCOUNTER — Ambulatory Visit (HOSPITAL_COMMUNITY)
Admission: RE | Admit: 2021-11-15 | Discharge: 2021-11-15 | Disposition: A | Discharge status: Home / Self Care | Payer: PPO | Source: Ambulatory Visit | Attending: Internal Medicine | Admitting: Internal Medicine

## 2021-11-15 DIAGNOSIS — I080 Rheumatic disorders of both mitral and aortic valves: Secondary | ICD-10-CM | POA: Diagnosis not present

## 2021-11-15 DIAGNOSIS — R011 Cardiac murmur, unspecified: Secondary | ICD-10-CM | POA: Insufficient documentation

## 2021-11-15 LAB — ECHOCARDIOGRAM COMPLETE
AR max vel: 2.86 cm2
AV Area VTI: 3.11 cm2
AV Area mean vel: 2.8 cm2
AV Mean grad: 3 mmHg
AV Peak grad: 5.6 mmHg
Ao pk vel: 1.18 m/s
Area-P 1/2: 2.42 cm2
MV VTI: 3.07 cm2
P 1/2 time: 394 msec
S' Lateral: 2.9 cm

## 2021-11-15 NOTE — Progress Notes (Incomplete)
*  PRELIMINARY RESULTS* Echocardiogram 2D Echocardiogram has been performed.  Olivia Evans 11/15/2021, 12:12 PM

## 2021-11-18 ENCOUNTER — Encounter: Payer: Self-pay | Admitting: Physical Therapy

## 2021-11-18 ENCOUNTER — Ambulatory Visit: Payer: PPO | Admitting: Physical Therapy

## 2021-11-18 DIAGNOSIS — M6281 Muscle weakness (generalized): Secondary | ICD-10-CM | POA: Diagnosis not present

## 2021-11-18 DIAGNOSIS — M542 Cervicalgia: Secondary | ICD-10-CM

## 2021-11-18 NOTE — Therapy (Signed)
OUTPATIENT PHYSICAL THERAPY TREATMENT NOTE   Patient Name: Olivia Evans MRN: [id number] DOB:1926-06-09, 88 y.o., female Today's Date: 11/18/2021  PCP: [name], MD REFERRING PROVIDER: Persons, Bevely Palmer, [place]  END OF SESSION:   PT End of Session - 11/18/21 1551     Visit Number 5    Number of Visits 6    Date for PT Re-Evaluation 12/13/21    PT Start Time 1440    PT Stop Time 1519    PT Time Calculation (min) 39 min    Activity Tolerance Patient tolerated treatment well                Past Medical History:  Diagnosis Date   Arthritis KNEES   Bladder pain    Chronic constipation    Diverticulosis 2013   Dr Fuller Plan   Dyslipidemia    Heart murmur    Hemorrhoids    History of adenomatous polyp of colon    2003 -- VILLOUS   History of breast cancer 2001---RIGHT BREAST CANCER   S/P PARTIAL MASTECTOMY AND RADIATION--  NO RECURRENCE   History of palpitations    Incomplete right bundle branch block (RBBB)    Interstitial cystitis    Dr McDiarmid   Lower urinary tract symptoms (LUTS)    Normal cardiac stress test    2000  PER MD NOTE   Osteopenia    Wears dentures    full -upper/  partial lower   Wears glasses    Past Surgical History:  Procedure Laterality Date   BENIGN TUMOR REMOVED  1960'S   SECOND RIB   CATARACT EXTRACTION W/ INTRAOCULAR LENS  IMPLANT, BILATERAL  feb 2016   CYSTO WITH HYDRODISTENSION  07/29/2011   Procedure: CYSTOSCOPY/HYDRODISTENSION;  Surgeon: Reece Packer, MD;  Location: [place];  Service: Urology;  Laterality: N/A;  with fulguration of bladder ulcer   CYSTO WITH HYDRODISTENSION N/A 08/28/2014   Procedure: CYSTO/HYDRODISTENSION, FULGURATION OF ULCERS;  Surgeon: Bjorn Loser, MD;  Location: [place];  Service: Urology;  Laterality: N/A;   CYSTO/  HYDRODISTENTION/  INSTILLATION THERAPY  x2  01-18- and 11-15- 2011   CYSTO/ HOD/  BLADDER BX'S/  FULGERATION HUNNER ULCER'S /  INSTILLATION THERAPY   10-01-2010   PARTIAL MASTECTOMY W/ SLN DISSECTION Right 12-19-1999   ROTATOR CUFF REPAIR     TUBAL LIGATION     VAGINAL HYSTERECTOMY  1970'S   WITH APPENDECTOMY   VARICOSE VEIN SURGERY     Patient Active Problem List   Diagnosis Date Noted   Murmur 11/06/2021   Myelodysplasia (myelodysplastic syndrome) ([place]) 11/05/2021   Neck pain 10/07/2021   Occipital neuralgia of left side 10/04/2021   Decreased hearing of both ears 03/08/2021   Bicytopenia 10/23/2020   History of breast cancer on right 10/16/2020   Anemia 11/28/2018   Prediabetes 10/13/2018   Bilateral leg edema 09/12/2017   Bilateral primary osteoarthritis of knee 12/22/2016   SOB (shortness of breath) on exertion 10/15/2016   Osteoporosis 03/26/2016   Diverticulosis of colon without hemorrhage 11/15/2013   Vitamin D deficiency 06/30/2012   INTERSTITIAL CYSTITIS 10/09/2009   Constipation 12/13/2007   History of colonic polyps 12/13/2007   SYNCOPE 06/24/2006    REFERRING DIAG:  M54.2 Neck pain M54.81 occipital neuralgia  THERAPY DIAG:  Cervicalgia  Muscle weakness (generalized)  Rationale for Evaluation and Treatment Rehabilitation  PERTINENT HISTORY: arthritis, diverticulosis, heart murmur, breast CA, mastectomy c radiation 2001, osteopenia, rotator cuff repair, hysterectomy, caricose vein  surgery  PRECAUTIONS: none  SUBJECTIVE:   PAIN:  Are you having pain? Yes: NPRS scale:  /10 Pain location: neck Pain description: achy, soreness Aggravating factors: siting, sleeping Relieving factors: heat   OBJECTIVE: (objective measures completed at initial evaluation unless otherwise dated)  PATIENT SURVEYS:    10/21/21: FOTO intake:  53% (predicted:  60%)     COGNITION:   Overall cognitive status: Within functional limits for tasks assessed   SENSATION:   WFL   POSTURE:   rounded shoulders and forward head, kyphotic thoracici spine   PALPATION:   TTP Rt upper trap, Rt levator, Rt infraspinatus       CERVICAL ROM:    Active ROM AROM (deg)   AROM 11/04/21 AROM 11/11/21  Flexion 45    Extension 22    Right lateral flexion 10 14   Left lateral flexion 20 22   Right rotation 34 38 40  Left rotation 46 46 48   (Blank rows = not tested)   UPPER EXTREMITY ROM:   Active ROM Right   Left    Shoulder flexion [facility] WFL  Shoulder extension [facility]  Shoulder abduction      Shoulder adduction      Shoulder extension      Shoulder internal rotation      Shoulder external rotation      Elbow flexion      Elbow extension      Wrist flexion      Wrist extension      Wrist ulnar deviation      Wrist radial deviation      Wrist pronation      Wrist supination       (Blank rows = not tested)   UPPER EXTREMITY MMT:   MMT Right   Left    Shoulder flexion 4-/5 4-/5  Shoulder extension 4/5 4/5  Shoulder abduction 4/5 4/5  Shoulder adduction      Shoulder extension      Shoulder internal rotation      Shoulder external rotation      Middle trapezius      Lower trapezius      Elbow flexion      Elbow extension      Wrist flexion      Wrist extension      Wrist ulnar deviation      Wrist radial deviation      Wrist pronation      Wrist supination      Grip strength       (Blank rows = not tested)         FUNCTIONAL TESTS:  11/04/21: TUG: 12 sec using bil UE for push off 10/21/21: TUG: to be performed at next visit        TODAY'S TREATMENT:  11/18/21:  TherEx:  Scifit bike x 5 minutes Neuromuscular Re-edu: Standing on Airex mat Standing tapping cones alternating Rocker board with instructions on posture correction and trunk extension.  Manual:  Gentle manual cervical traction  PROM to cervical spine in supine Modalities:  E-stim: Pre-modulated to Rt cervical paraspinals and upper trap, intensity to tolerance,  Moist heat x 10 minutes   11/11/21:  TherEx:  Nustep 6 minutes level 4 Standing rows Level 3 band x 15 holding 3 sec Standing shoulder  flexion with 1 # bar x 10 Standing shoulder extension Level 2 band x 15 holding 3 sec Cervical rotation x 3 holding 10 sec Cervical side bending x 2 each  side holding 10 sec Manual:  Gentle manual cervical traction  PROM to cervical spine in supine Modalities:  E-stim: Pre-modulated to Rt cervical paraspinals and upper trap, intensity to tolerance,  Moist heat x 10 minutes   11/04/21:  TherEx:  Nustep 6 minutes level 4 Standing rows Level 3 band x 15 holding 3 sec Seated shoulder flexion with 1 # bar x 10 Seated chest press with 1# bar x 10 Cervical retraction x 10 holding 5 sec Cervical rotation x 3 holding 10 sec Cervical side bending x 2 each side holding 10 sec Manual:  Gentle manual cervical traction  PROM to cervical spine in supine Modalities:  E-stim: Pre-modulated to Rt cervical paraspinals and upper trap, intensity to tolerance,  Moist heat x 10 minutes            PATIENT EDUCATION:    Education details: HEP, POC Person educated: Patient Education method: Consulting civil engineer, Media planner, Verbal cues, and Handouts Education comprehension: verbalized understanding, returned demonstration, and verbal cues required       HOME EXERCISE PROGRAM: Access Code: [email or web address] URL: [email or web address] Date: 10/21/2021 Prepared by: Kearney Hard   Exercises - Seated Assisted Cervical Rotation with Towel  - 2 x daily - 7 x weekly - 5 reps - 10 seconds hold - Seated Upper Trap Stretch  - 2 x daily - 7 x weekly - 5 reps - 10 seconds hold - Mid-Lower Cervical Extension SNAG with Strap  - 2 x daily - 7 x weekly - 5 reps - 5 seconds hold - Standing Row with Anchored Resistance  - 2 x daily - 7 x weekly - 10-15 reps - 3 seconds hold   ASSESSMENT:   CLINICAL IMPRESSION: Pt arriving to therapy reporting no pain at rest, however pain reported at night and when trying to go to sleep. Stiffness still reported first thing in the morning. Pt's daughter present  during session today and ordered pt a home TENS unit which should be here this afternoon.We incorporated dynamic balance exercises with  neuromuscular re-edu with posture instructions  Pt with good response to manual therapy reporting no pain at end of session. Continue skilled PT.      Continue skilled PT to maximize pt's function and progress toward LTG's set.      OBJECTIVE IMPAIRMENTS decreased ROM, impaired flexibility, impaired UE functional use, and pain.    ACTIVITY LIMITATIONS sleeping, dressing, and reach over head   PARTICIPATION LIMITATIONS: community activity   PERSONAL FACTORS Age and 3+ comorbidities: see pertinent history  are also affecting patient's functional outcome.    REHAB POTENTIAL: fair to good   CLINICAL DECISION MAKING: Stable/uncomplicated   EVALUATION COMPLEXITY: Low     GOALS: Goals reviewed with patient? Yes   Short term PT Goals (target date for Short term goals are 3 weeks 11/15/21) Patient will demonstrate independent use of home exercise program to maintain progress from in clinic treatments. Goal status: MET 11/04/21   Long term PT goals (target dates for all long term goals are 7 weeks  12/13/21 )   1. Patient will demonstrate/report pain at worst less than or equal to 2/10 to facilitate minimal limitation in daily activity secondary to pain symptoms. Goal status: On-going 11/11/21   2. Patient will demonstrate independent use of home exercise program to facilitate ability to maintain/progress functional gains from skilled physical therapy services. Goal status: New   3. Patient will demonstrate FOTO outcome > or = 60 % to indicate reduced disability due to condition.  Goal status: New   4.  Patient will demonstrate cervical AROM WFL s symptoms to facilitate usual head movements for daily activity including driving, self care.    Goal status: On-going 11/11/21   5.  Pt will improved bilateral cervical rotation to >/= 50 degrees for  improved functional mobility .    Goal status: On-going 11/11/21           PLAN: PT FREQUENCY: 1-2x/week   PT DURATION: 10 weeks   PLANNED INTERVENTIONS:  Therapeutic exercises, Therapeutic activity, Neuro Muscular re-education, Balance training, Gait training, Patient/Family education, Joint mobilization, Stair training, DME instructions, Dry Needling, Electrical stimulation, Cryotherapy, Traction Moist heat, Taping, Ultrasound, Ionotophoresis 56m/ml Dexamethasone, and Manual therapy.  All included unless contraindicated   PLAN FOR NEXT SESSION: Discharge next visit       JOretha Caprice PT, MPT 11/18/2021, 3:53 PM

## 2021-11-25 ENCOUNTER — Encounter: Payer: PPO | Admitting: Physical Therapy

## 2021-11-26 ENCOUNTER — Encounter: Payer: Self-pay | Admitting: Physical Therapy

## 2021-11-26 ENCOUNTER — Ambulatory Visit: Payer: PPO | Admitting: Physical Therapy

## 2021-11-26 DIAGNOSIS — M6281 Muscle weakness (generalized): Secondary | ICD-10-CM

## 2021-11-26 DIAGNOSIS — M542 Cervicalgia: Secondary | ICD-10-CM | POA: Diagnosis not present

## 2021-11-26 NOTE — Therapy (Signed)
OUTPATIENT PHYSICAL THERAPY TREATMENT NOTE Discharge   Patient Name: Olivia Evans MRN: [id number] DOB:1926-11-15, 88 y.o., female Today's Date: 11/26/2021  PCP: [name], MD REFERRING PROVIDER: Persons, Bevely Palmer, [place]  END OF SESSION:   PT End of Session - 11/26/21 1347     Visit Number 6    Number of Visits 6    Date for PT Re-Evaluation 12/13/21    PT Start Time 1300    PT Stop Time 1345    PT Time Calculation (min) 45 min    Activity Tolerance Patient tolerated treatment well    Behavior During Therapy [facility] for tasks assessed/performed                 Past Medical History:  Diagnosis Date   Arthritis KNEES   Bladder pain    Chronic constipation    Diverticulosis 2013   Dr Fuller Plan   Dyslipidemia    Heart murmur    Hemorrhoids    History of adenomatous polyp of colon    2003 -- VILLOUS   History of breast cancer 2001---RIGHT BREAST CANCER   S/P PARTIAL MASTECTOMY AND RADIATION--  NO RECURRENCE   History of palpitations    Incomplete right bundle branch block (RBBB)    Interstitial cystitis    Dr McDiarmid   Lower urinary tract symptoms (LUTS)    Normal cardiac stress test    2000  PER MD NOTE   Osteopenia    Wears dentures    full -upper/  partial lower   Wears glasses    Past Surgical History:  Procedure Laterality Date   BENIGN TUMOR REMOVED  1960'S   SECOND RIB   CATARACT EXTRACTION W/ INTRAOCULAR LENS  IMPLANT, BILATERAL  feb 2016   CYSTO WITH HYDRODISTENSION  07/29/2011   Procedure: CYSTOSCOPY/HYDRODISTENSION;  Surgeon: Reece Packer, MD;  Location: [place];  Service: Urology;  Laterality: N/A;  with fulguration of bladder ulcer   CYSTO WITH HYDRODISTENSION N/A 08/28/2014   Procedure: CYSTO/HYDRODISTENSION, FULGURATION OF ULCERS;  Surgeon: Bjorn Loser, MD;  Location: [place];  Service: Urology;  Laterality: N/A;   CYSTO/  HYDRODISTENTION/  INSTILLATION THERAPY  x2  01-18- and 11-15- 2011    CYSTO/ HOD/  BLADDER BX'S/  FULGERATION HUNNER ULCER'S /  INSTILLATION THERAPY  10-01-2010   PARTIAL MASTECTOMY W/ SLN DISSECTION Right 12-19-1999   ROTATOR CUFF REPAIR     TUBAL LIGATION     VAGINAL HYSTERECTOMY  1970'S   WITH APPENDECTOMY   VARICOSE VEIN SURGERY     Patient Active Problem List   Diagnosis Date Noted   Murmur 11/06/2021   Myelodysplasia (myelodysplastic syndrome) ([place]) 11/05/2021   Neck pain 10/07/2021   Occipital neuralgia of left side 10/04/2021   Decreased hearing of both ears 03/08/2021   Bicytopenia 10/23/2020   History of breast cancer on right 10/16/2020   Anemia 11/28/2018   Prediabetes 10/13/2018   Bilateral leg edema 09/12/2017   Bilateral primary osteoarthritis of knee 12/22/2016   SOB (shortness of breath) on exertion 10/15/2016   Osteoporosis 03/26/2016   Diverticulosis of colon without hemorrhage 11/15/2013   Vitamin D deficiency 06/30/2012   INTERSTITIAL CYSTITIS 10/09/2009   Constipation 12/13/2007   History of colonic polyps 12/13/2007   SYNCOPE 06/24/2006    REFERRING DIAG:  M54.2 Neck pain M54.81 occipital neuralgia  THERAPY DIAG:  Cervicalgia  Muscle weakness (generalized)  Rationale for Evaluation and Treatment Rehabilitation  PERTINENT HISTORY: arthritis, diverticulosis, heart murmur, breast  CA, mastectomy c radiation 2001, osteopenia, rotator cuff repair, hysterectomy, caricose vein surgery  PRECAUTIONS: none  SUBJECTIVE:  Pt arriving today reporting some fatigue after being up all morning completing tasks around her home.   PAIN:  Are you having pain? Yes: NPRS scale: 2/10 Pain location: neck Pain description: achy, soreness Aggravating factors: siting, sleeping Relieving factors: heat   OBJECTIVE: (objective measures completed at initial evaluation unless otherwise dated)  PATIENT SURVEYS:    10/21/21: FOTO intake:  53% (predicted:  60%) 11/26/21: FOTO 58%     COGNITION:   Overall cognitive status: Within  functional limits for tasks assessed   SENSATION:   WFL   POSTURE:   rounded shoulders and forward head, kyphotic thoracici spine   PALPATION:   TTP Rt upper trap, Rt levator, Rt infraspinatus      CERVICAL ROM:    Active ROM AROM (deg)   AROM 11/04/21 AROM 11/11/21 AROM 11/26/21  Flexion 45   45  Extension 22   28  Right lateral flexion _0 Left lateral flexion _1 Right rotation 34 38 40 44  Left rotation 46 46 48 50   (Blank rows = not tested)   UPPER EXTREMITY ROM:   Active ROM Right   Left    Shoulder flexion [facility] WFL  Shoulder extension WFL WFL   (Blank rows = not tested)   UPPER EXTREMITY MMT:   MMT Right   Left   Rt/Left  Shoulder flexion 4-/5 4-/5 4/5  Shoulder extension 4/5 4/5 4/5  Shoulder abduction 4/5 4/5 4/5   (Blank rows = not tested)         FUNCTIONAL TESTS:  11/26/21: TUG: 11 sec using UE for push off 11/04/21: TUG: 12 sec using bil UE for push off 10/21/21: TUG: to be performed at next visit         TODAY'S TREATMENT:  11/26/21 TherEx: Review all of pt's HEP with pt and her daughter:  Seated Cervical retraction: x 5 Cervical rotation: x 3 each side holding 5 seconds Cervical extension x 3 holding 5 seconds Door stretch: x 4 holding 20 seconds Rows: x 10 holding 3 seconds Upper trap stretch x 3 holding 10 seconds Modalities:  Pre-modulated x 10 minutes, intensity to tolerance c moist heat on neck and shoulders in supine position   11/18/21:  TherEx:  Scifit bike x 5 minutes Neuromuscular Re-edu: Standing on Airex mat Standing tapping cones alternating Rocker board with instructions on posture correction and trunk extension.  Manual:  Gentle manual cervical traction  PROM to cervical spine in supine Modalities:  E-stim: Pre-modulated to Rt cervical paraspinals and upper trap, intensity to tolerance,  Moist heat x 10 minutes   11/11/21:  TherEx:  Nustep 6 minutes level 4 Standing rows Level 3 band x  15 holding 3 sec Standing shoulder flexion with 1 # bar x 10 Standing shoulder extension Level 2 band x 15 holding 3 sec Cervical rotation x 3 holding 10 sec Cervical side bending x 2 each side holding 10 sec Manual:  Gentle manual cervical traction  PROM to cervical spine in supine Modalities:  E-stim: Pre-modulated to Rt cervical paraspinals and upper trap, intensity to tolerance,  Moist heat x 10 minutes        PATIENT EDUCATION:    Education details: HEP, POC Person educated: Patient Education method: Consulting civil engineer, Demonstration, Verbal cues, and Handouts Education comprehension: verbalized understanding, returned demonstration, and verbal cues required  HOME EXERCISE PROGRAM: Access Code: [email or web address] URL: [email or web address] Date: 11/26/2021 Prepared by: Kearney Hard  Exercises - Seated Assisted Cervical Rotation with Towel  - 1-2 x daily - 7 x weekly - 5 reps - 10 seconds hold - Seated Upper Trap Stretch  - 1-2 x daily - 7 x weekly - 5 reps - 10 seconds hold - Mid-Lower Cervical Extension SNAG with Strap  - 1-2 x daily - 7 x weekly - 5 reps - 5 seconds hold - Standing Row with Anchored Resistance  - 1-2 x daily - 7 x weekly - 10-15 reps - 3 seconds hold - Doorway Pec Stretch at 90 Degrees Abduction  - 1-2 x daily - 7 x weekly - 3 reps - 20 seconds hold - Seated Cervical Retraction  - 1-2 x daily - 7 x weekly - 10 reps - 5 seconds hold   ASSESSMENT:   CLINICAL IMPRESSION: Pt has met all of her STG's and 3/5 of her LTG's set at her initial evaluation. Pt still progressing with Rt cervical rotation and lateral cervical flexion. Pt reporting still being limited at times when she suddenly turns her head to one direction, but overall pt is able to complete her ADL's. Pt is being discharged from skilled PT with an updated HEP and home TENS unit.        OBJECTIVE IMPAIRMENTS decreased ROM, impaired flexibility, impaired UE functional use, and pain.     ACTIVITY LIMITATIONS sleeping, dressing, and reach over head   PARTICIPATION LIMITATIONS: community activity   PERSONAL FACTORS Age and 3+ comorbidities: see pertinent history  are also affecting patient's functional outcome.    REHAB POTENTIAL: fair to good   CLINICAL DECISION MAKING: Stable/uncomplicated   EVALUATION COMPLEXITY: Low     GOALS: Goals reviewed with patient? Yes   Short term PT Goals (target date for Short term goals are 3 weeks 11/15/21) Patient will demonstrate independent use of home exercise program to maintain progress from in clinic treatments. Goal status: MET 11/04/21   Long term PT goals (target dates for all long term goals are 7 weeks  12/13/21 )   1. Patient will demonstrate/report pain at worst less than or equal to 2/10 to facilitate minimal limitation in daily activity secondary to pain symptoms. Goal status: Met: 11/26/21     2. Patient will demonstrate independent use of home exercise program to facilitate ability to maintain/progress functional gains from skilled physical therapy services. Goal status: Met: 11/26/21     3. Patient will demonstrate FOTO outcome > or = 60 % to indicate reduced disability due to condition. Goal status: Partially Met: 11/26/21 (58%)     4.  Patient will demonstrate cervical AROM WFL s symptoms to facilitate usual head movements for daily activity including driving, self care.    Goal status: Met: 11/26/21     5.  Pt will improved bilateral cervical rotation to >/= 50 degrees for improved functional mobility .    Goal status: Partially Met: 11/26/21           PLAN: PT FREQUENCY: 1-2x/week   PT DURATION: 10 weeks   PLANNED INTERVENTIONS:  Therapeutic exercises, Therapeutic activity, Neuro Muscular re-education, Balance training, Gait training, Patient/Family education, Joint mobilization, Stair training, DME instructions, Dry Needling, Electrical stimulation, Cryotherapy, Traction Moist heat, Taping,  Ultrasound, Ionotophoresis 29m/ml Dexamethasone, and Manual therapy.  All included unless contraindicated   PLAN FOR NEXT SESSION: Discharged this visit       JOretha Caprice PT, MPT 11/26/2021,  2:18 PM PHYSICAL THERAPY DISCHARGE SUMMARY  Visits from Start of Care: 6  Current functional level related to goals / functional outcomes: See above   Remaining deficits: See above   Education / Equipment: HEP, TENS unit for home   Patient agrees to discharge. Patient goals were partially met. Patient is being discharged due to being pleased with the current functional level.

## 2022-01-14 NOTE — Progress Notes (Unsigned)
NEUROLOGY CONSULTATION NOTE  SHEARON CLONCH MRN: [id number] DOB: [birth year]  Referring provider: Billey Gosling, MD Primary care provider: Billey Gosling, MD  Reason for consult:  occipital neuralgia  Assessment/Plan:   Left sided occipital neuralgia Cervicalgia  She will continue gabapentin '100mg'$  as needed, which can be managed by her PCP.  If pain worsens or requires further care from me, she will schedule a follow up appointment.   Subjective:  Olivia Evans is a 88 year old female who presents for left-sided occipital neuralgia.  History supplemented by her accompanying daughter and referring provider's notes.  For almost a year, she reports shooting pain from the left posterior neck radiating up the occipital region to the left ear.  Cervical spine X-ray on 10/07/2021 personally reviewed showed multilevel degenerative changes particularly at C4-5, C5-6 and C6-7.  Evaluated by orthopedics.  She was sent for PT which helped and was diagnosed with occipital neuralgia.  She was prescribed gabapentin which she takes as needed.  The attacks are not frequent and manageable.     Current medications:  gabapentin '100mg'$  BID PRN,   PAST MEDICAL HISTORY: Past Medical History:  Diagnosis Date   Arthritis KNEES   Bladder pain    Chronic constipation    Diverticulosis 2013   Dr Fuller Plan   Dyslipidemia    Heart murmur    Hemorrhoids    History of adenomatous polyp of colon    2003 -- VILLOUS   History of breast cancer 2001---RIGHT BREAST CANCER   S/P PARTIAL MASTECTOMY AND RADIATION--  NO RECURRENCE   History of palpitations    Incomplete right bundle branch block (RBBB)    Interstitial cystitis    Dr McDiarmid   Lower urinary tract symptoms (LUTS)    Normal cardiac stress test    2000  PER MD NOTE   Osteopenia    Wears dentures    full -upper/  partial lower   Wears glasses     PAST SURGICAL HISTORY: Past Surgical History:  Procedure Laterality Date   BENIGN TUMOR REMOVED   1960'S   SECOND RIB   CATARACT EXTRACTION W/ INTRAOCULAR LENS  IMPLANT, BILATERAL  feb 2016   CYSTO WITH HYDRODISTENSION  07/29/2011   Procedure: CYSTOSCOPY/HYDRODISTENSION;  Surgeon: Reece Packer, MD;  Location: [place];  Service: Urology;  Laterality: N/A;  with fulguration of bladder ulcer   CYSTO WITH HYDRODISTENSION N/A 08/28/2014   Procedure: CYSTO/HYDRODISTENSION, FULGURATION OF ULCERS;  Surgeon: Bjorn Loser, MD;  Location: [place];  Service: Urology;  Laterality: N/A;   CYSTO/  HYDRODISTENTION/  INSTILLATION THERAPY  x2  01-18- and 11-15- 2011   CYSTO/ HOD/  BLADDER BX'S/  FULGERATION HUNNER ULCER'S /  INSTILLATION THERAPY  10-01-2010   PARTIAL MASTECTOMY W/ SLN DISSECTION Right 12-19-1999   ROTATOR CUFF REPAIR     TUBAL LIGATION     VAGINAL HYSTERECTOMY  1970'S   WITH APPENDECTOMY   VARICOSE VEIN SURGERY      MEDICATIONS: Current Outpatient Medications on File Prior to Visit  Medication Sig Dispense Refill   acetaminophen (TYLENOL) 325 MG tablet Take 325 mg by mouth every 8 (eight) hours as needed for moderate pain.     Apoaequorin (PREVAGEN PO) Take by mouth.     Cholecalciferol (VITAMIN D3) 1000 UNITS CAPS Take 4,000 Units by mouth daily.     Ferrous Sulfate (IRON PO) Take 1 tablet by mouth 2 (two) times daily. Patient takes Mega-Food Blood Builder Iron Minis  furosemide (LASIX) 20 MG tablet Take 1 tablet (20 mg total) by mouth daily. 30 tablet 5   gabapentin (NEURONTIN) 100 MG capsule Take 1 capsule (100 mg total) by mouth 2 (two) times daily. 60 capsule 3   Multiple Vitamins-Minerals (PRESERVISION AREDS 2 PO) Take 1 capsule by mouth in the morning and at bedtime.     OVER THE COUNTER MEDICATION Take 1 tablet by mouth at bedtime as needed (laxitive). PruneLax     Polyethyl Glycol-Propyl Glycol (SYSTANE OP) Place 1 drop into both eyes daily. To both eyes.     Pumpkin Seed-Soy Germ (AZO BLADDER CONTROL/GO-LESS) CAPS Take 1 capsule  by mouth daily.     No current facility-administered medications on file prior to visit.    ALLERGIES: Allergies  Allergen Reactions   Premarin [Conjugated Estrogens] Other (See Comments)    TABS--  CAUSE PHLEBITIS   Vicodin [Hydrocodone-Acetaminophen] Itching    SEVERE   Adhesive [Tape] Other (See Comments)    IRRITATION   Fluvastatin Sodium Other (See Comments)    HEART PALPITATIONS   Simvastatin Other (See Comments)    SEVERE LEG ACHES    FAMILY HISTORY: Family History  Problem Relation Age of Onset   Heart disease Mother         CHF; no MI   Breast cancer Sister        S/P bilateral mastectomy   Diabetes Maternal Grandmother    Thyroid disease Other        3 daughters   Stroke Brother 68   Colon cancer Neg Hx    Esophageal cancer Neg Hx    Stomach cancer Neg Hx    Rectal cancer Neg Hx     Objective:  Blood pressure (!) 144/70, height '5\' 4"'$  (1.626 m), weight 135 lb (61.2 kg). General: No acute distress.  Patient appears well-groomed.   Head:  Normocephalic/atraumatic Eyes:  fundi examined but not visualized Neck: supple, mild bilateral paraspinal tenderness, full range of motion Back: No paraspinal tenderness Heart: regular rate and rhythm Lungs: Clear to auscultation bilaterally. Vascular: No carotid bruits. Neurological Exam: Mental status: alert and oriented to person, place, and time, speech fluent and not dysarthric, language intact. Cranial nerves: CN I: not tested CN II: pupils equal, round and reactive to light, visual fields intact CN III, IV, VI:  full range of motion, no nystagmus, no ptosis CN V: facial sensation intact. CN VII: upper and lower face symmetric CN VIII: hearing intact CN IX, X: gag intact, uvula midline CN XI: sternocleidomastoid and trapezius muscles intact CN XII: tongue midline Bulk & Tone: normal, no fasciculations. Motor:  muscle strength 5/5 throughout Sensation:  emperature and vibratory sensation intact. Deep Tendon  Reflexes:  absent throughout,  toes downgoing.   Finger to nose testing:  Without dysmetria.   Gait:  Cautious gait.  Romberg negative.    Thank you for allowing me to take part in the care of this patient.  Metta Clines, DO  CC: Billey Gosling, MD

## 2022-01-15 ENCOUNTER — Ambulatory Visit: Payer: PPO | Admitting: Neurology

## 2022-01-15 ENCOUNTER — Encounter: Payer: Self-pay | Admitting: Neurology

## 2022-01-15 VITALS — BP 144/70 | Ht 64.0 in | Wt 135.0 lb

## 2022-01-15 DIAGNOSIS — M5481 Occipital neuralgia: Secondary | ICD-10-CM

## 2022-01-15 DIAGNOSIS — M542 Cervicalgia: Secondary | ICD-10-CM

## 2022-01-15 NOTE — Patient Instructions (Signed)
Continue gabapentin as needed. Follow up with me as needed

## 2022-01-16 ENCOUNTER — Ambulatory Visit (INDEPENDENT_AMBULATORY_CARE_PROVIDER_SITE_OTHER): Payer: PPO | Admitting: Family Medicine

## 2022-01-16 ENCOUNTER — Encounter: Payer: Self-pay | Admitting: Family Medicine

## 2022-01-16 VITALS — BP 138/32 | HR 80 | Temp 97.8°F | Ht 64.0 in | Wt 133.0 lb

## 2022-01-16 DIAGNOSIS — M7989 Other specified soft tissue disorders: Secondary | ICD-10-CM | POA: Diagnosis not present

## 2022-01-16 DIAGNOSIS — R600 Localized edema: Secondary | ICD-10-CM | POA: Diagnosis not present

## 2022-01-16 DIAGNOSIS — M79604 Pain in right leg: Secondary | ICD-10-CM | POA: Diagnosis not present

## 2022-01-16 DIAGNOSIS — R011 Cardiac murmur, unspecified: Secondary | ICD-10-CM

## 2022-01-16 MED ORDER — TORSEMIDE 20 MG PO TABS: 20.0000 mg | ORAL_TABLET | Freq: Every day | ORAL | 1 refills | Status: DC

## 2022-01-16 NOTE — Patient Instructions (Signed)
Stop taking Lasix.   Start torsemide tomorrow morning.   Wear compression socks as discussed.   Elevate your legs when sitting.   Avoid salt.   Follow up with Dr. Quay Burow in the next 2-3 weeks.

## 2022-01-16 NOTE — Progress Notes (Signed)
Subjective:     Patient ID: KRISSIE MERRICK, female    DOB: 1926/05/05, 88 y.o.   MRN: [id number]  Chief Complaint  Patient presents with   Foot Swelling    Bilateral leg swelling, feels like legs are about to pop. Legs have been swollen for at least 3 weeks now. Last time she was here was put on Lasix but didn't want to up dosage due to causing heart problems.     HPI Patient is in today for a 3 wk hx of bilateral LE edema. Right leg is more swollen and calf is tender.  Reports taking Lasix 20 mg daily.   Denies fever, chills, dizziness, chest pain, palpitations, shortness of breath, abdominal pain, N/V/D.    Health Maintenance Due  Topic Date Due   DTaP/Tdap/Td (1 - Tdap) 07/22/2007   COVID-19 Vaccine (3 - Moderna risk series) 10/17/2019    Past Medical History:  Diagnosis Date   Arthritis KNEES   Bladder pain    Chronic constipation    Diverticulosis 2013   Dr Fuller Plan   Dyslipidemia    Heart murmur    Hemorrhoids    History of adenomatous polyp of colon    2003 -- VILLOUS   History of breast cancer 2001---RIGHT BREAST CANCER   S/P PARTIAL MASTECTOMY AND RADIATION--  NO RECURRENCE   History of palpitations    Incomplete right bundle branch block (RBBB)    Interstitial cystitis    Dr McDiarmid   Lower urinary tract symptoms (LUTS)    Normal cardiac stress test    2000  PER MD NOTE   Osteopenia    Wears dentures    full -upper/  partial lower   Wears glasses     Past Surgical History:  Procedure Laterality Date   BENIGN TUMOR REMOVED  1960'S   SECOND RIB   CATARACT EXTRACTION W/ INTRAOCULAR LENS  IMPLANT, BILATERAL  feb 2016   CYSTO WITH HYDRODISTENSION  07/29/2011   Procedure: CYSTOSCOPY/HYDRODISTENSION;  Surgeon: Reece Packer, MD;  Location: [place] [facility];  Service: Urology;  Laterality: N/A;  with fulguration of bladder ulcer   CYSTO WITH HYDRODISTENSION N/A 08/28/2014   Procedure: CYSTO/HYDRODISTENSION, FULGURATION OF ULCERS;  Surgeon:  Bjorn Loser, MD;  Location: [place];  Service: Urology;  Laterality: N/A;   CYSTO/  HYDRODISTENTION/  INSTILLATION THERAPY  x2  01-18- and 11-15- 2011   CYSTO/ HOD/  BLADDER BX'S/  FULGERATION HUNNER ULCER'S /  INSTILLATION THERAPY  10-01-2010   PARTIAL MASTECTOMY W/ SLN DISSECTION Right 12-19-1999   ROTATOR CUFF REPAIR     TUBAL LIGATION     VAGINAL HYSTERECTOMY  1970'S   WITH APPENDECTOMY   VARICOSE VEIN SURGERY      Family History  Problem Relation Age of Onset   Heart disease Mother         CHF; no MI   Dementia Sister    Breast cancer Sister        S/P bilateral mastectomy   Stroke Brother 24   Diabetes Maternal Grandmother    Thyroid disease Other        3 daughters   Colon cancer Neg Hx    Esophageal cancer Neg Hx    Stomach cancer Neg Hx    Rectal cancer Neg Hx     Social History   Socioeconomic History   Marital status: Widowed    Spouse name: Not on file   Number of children: 4   Years  of education: Not on file   Highest education level: Not on file  Occupational History   Occupation: Retired  Tobacco Use   Smoking status: Never   Smokeless tobacco: Never  Vaping Use   Vaping Use: Never used  Substance and Sexual Activity   Alcohol use: No    Alcohol/week: 0.0 standard drinks of alcohol   Drug use: No   Sexual activity: Not on file  Other Topics Concern   Not on file  Social History Narrative   Are you right handed or left handed? RIght   Are you currently employed ? NO   What is your current occupation? Retired   Do you live at home alone? yes   Who lives with you?    What type of home do you live in: 1 story or 2 story? 1       Social Determinants of Health   Financial Resource Strain: Low Risk  (04/25/2021)   Overall Financial Resource Strain (CARDIA)    Difficulty of Paying Living Expenses: Not hard at all  Food Insecurity: No Food Insecurity (04/25/2021)   Hunger Vital Sign    Worried About Running Out of Food in the  Last Year: Never true    Ran Out of Food in the Last Year: Never true  Transportation Needs: No Transportation Needs (04/25/2021)   PRAPARE - Hydrologist (Medical): No    Lack of Transportation (Non-Medical): No  Physical Activity: Sufficiently Active (04/25/2021)   Exercise Vital Sign    Days of Exercise per Week: 5 days    Minutes of Exercise per Session: 30 min  Stress: No Stress Concern Present (04/25/2021)   [place]    Feeling of Stress : Not at all  Social Connections: Moderately Integrated (04/25/2021)   Social Connection and Isolation Panel [NHANES]    Frequency of Communication with Friends and Family: More than three times a week    Frequency of Social Gatherings with Friends and Family: More than three times a week    Attends Religious Services: More than 4 times per year    Active Member of [employer] or Organizations: No    Attends Music therapist: More than 4 times per year    Marital Status: Widowed  Intimate Partner Violence: Not At Risk (04/25/2021)   Humiliation, Afraid, Rape, and Kick questionnaire    Fear of Current or Ex-Partner: No    Emotionally Abused: No    Physically Abused: No    Sexually Abused: No    Outpatient Medications Prior to Visit  Medication Sig Dispense Refill   acetaminophen (TYLENOL) 325 MG tablet Take 325 mg by mouth every 8 (eight) hours as needed for moderate pain.     Apoaequorin (PREVAGEN PO) Take by mouth.     Cholecalciferol (VITAMIN D3) 1000 UNITS CAPS Take 4,000 Units by mouth daily.     Ferrous Sulfate (IRON PO) Take 1 tablet by mouth 2 (two) times daily. Patient takes Mega-Food Blood Builder Iron Minis     gabapentin (NEURONTIN) 100 MG capsule Take 1 capsule (100 mg total) by mouth 2 (two) times daily. (Patient taking differently: Take 100 mg by mouth 2 (two) times daily. As needed) 60 capsule 3   Multiple Vitamins-Minerals  (PRESERVISION AREDS 2 PO) Take 1 capsule by mouth in the morning and at bedtime.     OVER THE COUNTER MEDICATION Take 1 tablet by mouth at bedtime as  needed (laxitive). PruneLax     Polyethyl Glycol-Propyl Glycol (SYSTANE OP) Place 1 drop into both eyes daily. To both eyes.     Pumpkin Seed-Soy Germ (AZO BLADDER CONTROL/GO-LESS) CAPS Take 1 capsule by mouth daily.     furosemide (LASIX) 20 MG tablet Take 1 tablet (20 mg total) by mouth daily. 30 tablet 5   No facility-administered medications prior to visit.    Allergies  Allergen Reactions   Premarin [Conjugated Estrogens] Other (See Comments)    TABS--  CAUSE PHLEBITIS   Vicodin [Hydrocodone-Acetaminophen] Itching    SEVERE   Adhesive [Tape] Other (See Comments)    IRRITATION   Fluvastatin Sodium Other (See Comments)    HEART PALPITATIONS   Simvastatin Other (See Comments)    SEVERE LEG ACHES    ROS     Objective:    Physical Exam Constitutional:      General: She is not in acute distress.    Appearance: She is not ill-appearing.  Cardiovascular:     Rate and Rhythm: Normal rate and regular rhythm.     Heart sounds: Murmur heard.  Pulmonary:     Effort: Pulmonary effort is normal.     Breath sounds: Normal breath sounds.  Musculoskeletal:     Right lower leg: 1+ Pitting Edema present.     Left lower leg: 1+ Pitting Edema present.     Comments: RLE is significantly more swollen than LLE. Right calf with TTP.  Palpable pedal pulses bilaterally. Good cap refill, sensation and ROM.   Skin:    General: Skin is warm and dry.  Neurological:     General: No focal deficit present.     Mental Status: She is alert.     Cranial Nerves: No cranial nerve deficit.     Sensory: No sensory deficit.     Motor: No weakness.  Psychiatric:        Mood and Affect: Mood normal.        Behavior: Behavior normal.     BP (!) 138/32 (BP Location: Left Arm, Patient Position: Sitting, Cuff Size: Normal)   Pulse 80   Temp 97.8 F (36.6  C) (Temporal)   Ht '5\' 4"'$  (1.626 m)   Wt 133 lb (60.3 kg)   SpO2 98%   BMI 22.83 kg/m  Wt Readings from Last 3 Encounters:  01/16/22 133 lb (60.3 kg)  01/15/22 135 lb (61.2 kg)  11/06/21 129 lb (58.5 kg)       Assessment & Plan:   Problem List Items Addressed This Visit       Other   Bilateral leg edema - Primary   Relevant Medications   torsemide (DEMADEX) 20 MG tablet   Other Relevant Orders   VAS [place] LOWER EXTREMITY VENOUS (DVT)   Murmur   Pain and swelling of lower extremity, right   Relevant Orders   VAS [place] LOWER EXTREMITY VENOUS (DVT)   Reviewed recent notes from PCP and echocardiogram showing diastolic dysfunction which is most likely causing her widening pulse pressure today. Normal EF.  Vascular US ordered to bilateral LE and to rule out DVT on right.  Consulted with Dr. Ronnald Ramp. I will switch Lasix to torsemide. Encourage low sodium diet, elevating legs with sitting and compression socks. Her daughter is with her.  Answered all of their questions.  Follow up with PCP in 2-3 weeks.   I have discontinued Jacquilyn H. Deavers's furosemide. I am also having her start on torsemide. Additionally, I am having  her maintain her Polyethyl Glycol-Propyl Glycol (SYSTANE OP), Vitamin D3, AZO Bladder Control/Go-Less, Multiple Vitamins-Minerals (PRESERVISION AREDS 2 PO), acetaminophen, OVER THE COUNTER MEDICATION, Ferrous Sulfate (IRON PO), gabapentin, and Apoaequorin (PREVAGEN PO).  Meds ordered this encounter  Medications   torsemide (DEMADEX) 20 MG tablet    Sig: Take 1 tablet (20 mg total) by mouth daily.    Dispense:  30 tablet    Refill:  1    Order Specific Question:   Supervising Provider    Answer:   Pricilla Holm A [[id number]]

## 2022-01-17 ENCOUNTER — Telehealth: Payer: Self-pay

## 2022-01-17 ENCOUNTER — Ambulatory Visit (HOSPITAL_COMMUNITY)
Admission: RE | Admit: 2022-01-17 | Discharge: 2022-01-17 | Disposition: A | Discharge status: Home / Self Care | Payer: PPO | Source: Ambulatory Visit | Attending: Family Medicine | Admitting: Family Medicine

## 2022-01-17 DIAGNOSIS — M79604 Pain in right leg: Secondary | ICD-10-CM | POA: Insufficient documentation

## 2022-01-17 DIAGNOSIS — M7989 Other specified soft tissue disorders: Secondary | ICD-10-CM | POA: Insufficient documentation

## 2022-01-17 DIAGNOSIS — R600 Localized edema: Secondary | ICD-10-CM | POA: Insufficient documentation

## 2022-01-17 NOTE — Progress Notes (Signed)
The preliminary report from her vascular US is negative for a blood clot in her legs. This is good news.

## 2022-01-17 NOTE — Progress Notes (Signed)
Lower extremity venous has been completed.   Preliminary results in CV Proc.   Jinny Blossom Debra Calabretta 01/17/2022 1:38 PM

## 2022-01-17 NOTE — Telephone Encounter (Signed)
[facility] called to report pt is NEGATIVE for DVT in both legs.

## 2022-01-29 DIAGNOSIS — H353131 Nonexudative age-related macular degeneration, bilateral, early dry stage: Secondary | ICD-10-CM | POA: Diagnosis not present

## 2022-02-03 ENCOUNTER — Ambulatory Visit: Payer: PPO | Admitting: Neurology

## 2022-02-07 ENCOUNTER — Other Ambulatory Visit: Payer: Self-pay | Admitting: Family Medicine

## 2022-02-07 DIAGNOSIS — R600 Localized edema: Secondary | ICD-10-CM

## 2022-02-18 DIAGNOSIS — L821 Other seborrheic keratosis: Secondary | ICD-10-CM | POA: Diagnosis not present

## 2022-02-18 DIAGNOSIS — Z85828 Personal history of other malignant neoplasm of skin: Secondary | ICD-10-CM | POA: Diagnosis not present

## 2022-02-18 DIAGNOSIS — Z08 Encounter for follow-up examination after completed treatment for malignant neoplasm: Secondary | ICD-10-CM | POA: Diagnosis not present

## 2022-02-18 DIAGNOSIS — L820 Inflamed seborrheic keratosis: Secondary | ICD-10-CM | POA: Diagnosis not present

## 2022-02-18 DIAGNOSIS — L298 Other pruritus: Secondary | ICD-10-CM | POA: Diagnosis not present

## 2022-02-18 DIAGNOSIS — M71341 Other bursal cyst, right hand: Secondary | ICD-10-CM | POA: Diagnosis not present

## 2022-02-18 DIAGNOSIS — L814 Other melanin hyperpigmentation: Secondary | ICD-10-CM | POA: Diagnosis not present

## 2022-02-18 DIAGNOSIS — L570 Actinic keratosis: Secondary | ICD-10-CM | POA: Diagnosis not present

## 2022-02-19 DIAGNOSIS — H04123 Dry eye syndrome of bilateral lacrimal glands: Secondary | ICD-10-CM | POA: Diagnosis not present

## 2022-02-27 ENCOUNTER — Encounter: Payer: Self-pay | Admitting: Family Medicine

## 2022-02-27 ENCOUNTER — Other Ambulatory Visit: Payer: Self-pay

## 2022-02-27 ENCOUNTER — Inpatient Hospital Stay: Payer: PPO | Attending: Internal Medicine

## 2022-02-27 ENCOUNTER — Inpatient Hospital Stay: Payer: PPO | Admitting: Internal Medicine

## 2022-02-27 ENCOUNTER — Ambulatory Visit (INDEPENDENT_AMBULATORY_CARE_PROVIDER_SITE_OTHER): Payer: PPO | Admitting: Family Medicine

## 2022-02-27 VITALS — BP 134/60 | HR 70 | Temp 97.6°F | Resp 16 | Wt 129.0 lb

## 2022-02-27 VITALS — BP 126/52 | HR 75 | Temp 97.7°F | Ht 64.0 in

## 2022-02-27 DIAGNOSIS — Z79899 Other long term (current) drug therapy: Secondary | ICD-10-CM | POA: Insufficient documentation

## 2022-02-27 DIAGNOSIS — D649 Anemia, unspecified: Secondary | ICD-10-CM

## 2022-02-27 DIAGNOSIS — G500 Trigeminal neuralgia: Secondary | ICD-10-CM

## 2022-02-27 DIAGNOSIS — D469 Myelodysplastic syndrome, unspecified: Secondary | ICD-10-CM | POA: Insufficient documentation

## 2022-02-27 LAB — CBC WITH DIFFERENTIAL (CANCER CENTER ONLY)
Abs Immature Granulocytes: 0 10*3/uL (ref 0.00–0.07)
Basophils Absolute: 0.1 10*3/uL (ref 0.0–0.1)
Basophils Relative: 2 %
Eosinophils Absolute: 0.2 10*3/uL (ref 0.0–0.5)
Eosinophils Relative: 6 %
HCT: 26.3 % — ABNORMAL LOW (ref 36.0–46.0)
Hemoglobin: 8.9 g/dL — ABNORMAL LOW (ref 12.0–15.0)
Immature Granulocytes: 0 %
Lymphocytes Relative: 41 %
Lymphs Abs: 1.4 10*3/uL (ref 0.7–4.0)
MCH: 37.9 pg — ABNORMAL HIGH (ref 26.0–34.0)
MCHC: 33.8 g/dL (ref 30.0–36.0)
MCV: 111.9 fL — ABNORMAL HIGH (ref 80.0–100.0)
Monocytes Absolute: 0.2 10*3/uL (ref 0.1–1.0)
Monocytes Relative: 7 %
Neutro Abs: 1.5 10*3/uL — ABNORMAL LOW (ref 1.7–7.7)
Neutrophils Relative %: 44 %
Platelet Count: 241 10*3/uL (ref 150–400)
RBC: 2.35 MIL/uL — ABNORMAL LOW (ref 3.87–5.11)
RDW: 13.2 % (ref 11.5–15.5)
WBC Count: 3.4 10*3/uL — ABNORMAL LOW (ref 4.0–10.5)
nRBC: 0 % (ref 0.0–0.2)

## 2022-02-27 LAB — IRON AND IRON BINDING CAPACITY (CC-WL,HP ONLY)
Iron: 137 ug/dL (ref 28–170)
Saturation Ratios: 54 % — ABNORMAL HIGH (ref 10.4–31.8)
TIBC: 256 ug/dL (ref 250–450)
UIBC: 119 ug/dL — ABNORMAL LOW (ref 148–442)

## 2022-02-27 LAB — FERRITIN: Ferritin: 255 ng/mL (ref 11–307)

## 2022-02-27 NOTE — Progress Notes (Signed)
Subjective:     Patient ID: Olivia Evans, female    DOB: 1926-03-15, 89 y.o.   MRN: [id number]  Chief Complaint  Patient presents with   Ear Pain   Eye Pain    Pain up side of neck and ear a couple weeks ago, went to dr and it was nerve inflammation. Has resoved since then and now is giving her pain in jaw bone into check bone and eye on her left side. Started 2 days ago.     HPI Patient is in today for intermittent, fleeting, sharp pains in front of her left ear and across her left cheek, occasionally behind her left eye.   She has a hx of occipital neuralgia on the left. Treated by Dr. Tomi Likens. She took gabapentin once yesterday and it helped. She has not taken it today.   Denies fever, chills, headache, dizziness, double or blurry vision, nasal congestion, ear pain, ST, chest pain, palpitations, shortness of breath, abdominal pain, N/V/D. No focal weakness.     Health Maintenance Due  Topic Date Due   DTaP/Tdap/Td (1 - Tdap) 07/22/2007   Medicare Annual Wellness (AWV)  04/26/2022    Past Medical History:  Diagnosis Date   Arthritis KNEES   Bladder pain    Chronic constipation    Diverticulosis 2013   Dr Fuller Plan   Dyslipidemia    Heart murmur    Hemorrhoids    History of adenomatous polyp of colon    2003 -- VILLOUS   History of breast cancer 2001---RIGHT BREAST CANCER   S/P PARTIAL MASTECTOMY AND RADIATION--  NO RECURRENCE   History of palpitations    Incomplete right bundle branch block (RBBB)    Interstitial cystitis    Dr McDiarmid   Lower urinary tract symptoms (LUTS)    Normal cardiac stress test    2000  PER MD NOTE   Osteopenia    Wears dentures    full -upper/  partial lower   Wears glasses     Past Surgical History:  Procedure Laterality Date   BENIGN TUMOR REMOVED  1960'S   SECOND RIB   CATARACT EXTRACTION W/ INTRAOCULAR LENS  IMPLANT, BILATERAL  feb 2016   CYSTO WITH HYDRODISTENSION  07/29/2011   Procedure: CYSTOSCOPY/HYDRODISTENSION;  Surgeon:  Reece Packer, MD;  Location: [facility];  Service: Urology;  Laterality: N/A;  with fulguration of bladder ulcer   CYSTO WITH HYDRODISTENSION N/A 08/28/2014   Procedure: CYSTO/HYDRODISTENSION, FULGURATION OF ULCERS;  Surgeon: Bjorn Loser, MD;  Location: [facility];  Service: Urology;  Laterality: N/A;   CYSTO/  HYDRODISTENTION/  INSTILLATION THERAPY  x2  01-18- and 11-15- 2011   CYSTO/ HOD/  BLADDER BX'S/  FULGERATION HUNNER ULCER'S /  INSTILLATION THERAPY  10-01-2010   PARTIAL MASTECTOMY W/ SLN DISSECTION Right 12-19-1999   ROTATOR CUFF REPAIR     TUBAL LIGATION     VAGINAL HYSTERECTOMY  1970'S   WITH APPENDECTOMY   VARICOSE VEIN SURGERY      Family History  Problem Relation Age of Onset   Heart disease Mother         CHF; no MI   Dementia Sister    Breast cancer Sister        S/P bilateral mastectomy   Stroke Brother 16   Diabetes Maternal Grandmother    Thyroid disease Other        3 daughters   Colon cancer Neg Hx    Esophageal cancer Neg  Hx    Stomach cancer Neg Hx    Rectal cancer Neg Hx     Social History   Socioeconomic History   Marital status: Widowed    Spouse name: Not on file   Number of children: 4   Years of education: Not on file   Highest education level: Not on file  Occupational History   Occupation: Retired  Tobacco Use   Smoking status: Never   Smokeless tobacco: Never  Vaping Use   Vaping Use: Never used  Substance and Sexual Activity   Alcohol use: No    Alcohol/week: 0.0 standard drinks of alcohol   Drug use: No   Sexual activity: Not on file  Other Topics Concern   Not on file  Social History Narrative   Are you right handed or left handed? RIght   Are you currently employed ? NO   What is your current occupation? Retired   Do you live at home alone? yes   Who lives with you?    What type of home do you live in: 1 story or 2 story? 1       Social Determinants of Health   Financial Resource  Strain: Low Risk  (04/25/2021)   Overall Financial Resource Strain (CARDIA)    Difficulty of Paying Living Expenses: Not hard at all  Food Insecurity: No Food Insecurity (04/25/2021)   Hunger Vital Sign    Worried About Running Out of Food in the Last Year: Never true    Ran Out of Food in the Last Year: Never true  Transportation Needs: No Transportation Needs (04/25/2021)   PRAPARE - Hydrologist (Medical): No    Lack of Transportation (Non-Medical): No  Physical Activity: Sufficiently Active (04/25/2021)   Exercise Vital Sign    Days of Exercise per Week: 5 days    Minutes of Exercise per Session: 30 min  Stress: No Stress Concern Present (04/25/2021)   [place]    Feeling of Stress : Not at all  Social Connections: Moderately Integrated (04/25/2021)   Social Connection and Isolation Panel [NHANES]    Frequency of Communication with Friends and Family: More than three times a week    Frequency of Social Gatherings with Friends and Family: More than three times a week    Attends Religious Services: More than 4 times per year    Active Member of [employer] or Organizations: No    Attends Music therapist: More than 4 times per year    Marital Status: Widowed  Intimate Partner Violence: Not At Risk (04/25/2021)   Humiliation, Afraid, Rape, and Kick questionnaire    Fear of Current or Ex-Partner: No    Emotionally Abused: No    Physically Abused: No    Sexually Abused: No    Outpatient Medications Prior to Visit  Medication Sig Dispense Refill   acetaminophen (TYLENOL) 325 MG tablet Take 325 mg by mouth every 8 (eight) hours as needed for moderate pain.     Apoaequorin (PREVAGEN PO) Take by mouth.     Cholecalciferol (VITAMIN D3) 1000 UNITS CAPS Take 4,000 Units by mouth daily.     Ferrous Sulfate (IRON PO) Take 1 tablet by mouth 2 (two) times daily. Patient takes Mega-Food Blood Builder  Iron Minis     gabapentin (NEURONTIN) 100 MG capsule Take 1 capsule (100 mg total) by mouth 2 (two) times daily. (Patient taking differently: Take 100  mg by mouth 2 (two) times daily. As needed) 60 capsule 3   Multiple Vitamins-Minerals (PRESERVISION AREDS 2 PO) Take 1 capsule by mouth in the morning and at bedtime.     OVER THE COUNTER MEDICATION Take 1 tablet by mouth at bedtime as needed (laxitive). PruneLax     Polyethyl Glycol-Propyl Glycol (SYSTANE OP) Place 1 drop into both eyes daily. To both eyes.     Pumpkin Seed-Soy Germ (AZO BLADDER CONTROL/GO-LESS) CAPS Take 1 capsule by mouth daily.     torsemide (DEMADEX) 20 MG tablet Take 1 tablet (20 mg total) by mouth daily. 30 tablet 1   No facility-administered medications prior to visit.    Allergies  Allergen Reactions   Premarin [Conjugated Estrogens] Other (See Comments)    TABS--  CAUSE PHLEBITIS   Vicodin [Hydrocodone-Acetaminophen] Itching    SEVERE   Adhesive [Tape] Other (See Comments)    IRRITATION   Fluvastatin Sodium Other (See Comments)    HEART PALPITATIONS   Simvastatin Other (See Comments)    SEVERE LEG ACHES    ROS     Objective:    Physical Exam Constitutional:      General: She is not in acute distress.    Appearance: She is not ill-appearing.  HENT:     Head:     Jaw: No tenderness or pain on movement.     Comments: No fullness or tenderness to temporal artery    Right Ear: Tympanic membrane, ear canal and external ear normal. No tenderness. No mastoid tenderness.     Left Ear: Tympanic membrane, ear canal and external ear normal. No tenderness. No mastoid tenderness.     Nose: Nose normal.     Right Sinus: No maxillary sinus tenderness or frontal sinus tenderness.     Left Sinus: No maxillary sinus tenderness or frontal sinus tenderness.  Eyes:     General: Lids are normal. Vision grossly intact. No visual field deficit.    Extraocular Movements: Extraocular movements intact.      Conjunctiva/sclera: Conjunctivae normal.  Cardiovascular:     Rate and Rhythm: Normal rate and regular rhythm.     Heart sounds: Murmur heard.  Pulmonary:     Effort: Pulmonary effort is normal.     Breath sounds: Normal breath sounds.  Musculoskeletal:     Cervical back: Normal range of motion and neck supple.  Lymphadenopathy:     Cervical: No cervical adenopathy.  Skin:    General: Skin is warm and dry.     Findings: No erythema or rash.  Neurological:     General: No focal deficit present.     Mental Status: She is alert and oriented to person, place, and time.     Cranial Nerves: No facial asymmetry.     Sensory: Sensation is intact.     Motor: Motor function is intact.     Coordination: Coordination is intact.  Psychiatric:        Mood and Affect: Mood normal.        Behavior: Behavior normal.        Thought Content: Thought content normal.     BP (!) 126/52 (BP Location: Left Arm, Patient Position: Sitting, Cuff Size: Normal)   Pulse 75   Temp 97.7 F (36.5 C) (Temporal)   Ht '5\' 4"'$  (1.626 m)   SpO2 98%   BMI 22.14 kg/m  Wt Readings from Last 3 Encounters:  02/27/22 129 lb (58.5 kg)  01/16/22 133 lb (60.3 kg)  01/15/22 135 lb (61.2 kg)       Assessment & Plan:   Problem List Items Addressed This Visit   None Visit Diagnoses     Trigeminal neuralgia of left side of face    -  Primary      No sign of infection. Pain reproduced in office with certain movements of face such as talking or laughing and resolved within seconds. Exam is benign. No sign of temporal arteritis. She has a hx of occipital neuralgia on the same side. Reviewed notes from Dr. Tomi Likens. Pain improved with gabapentin. She will continue taking gabapentin and follow up with Dr. Tomi Likens or her PCP if worsening or any new symptoms arise. Discussed red flag symptoms and when to seek immediate medical care with patient and family member.   Visit time 20 minutes in face to face communication with  patient and coordination of care, additional 10 minutes spent in record review, coordination or care, ordering tests, communicating/referring to other healthcare professionals, documenting in medical records all on the same day of the visit for total time 30 minutes spent on the visit.    I am having Aariah H. Kindt maintain her Polyethyl Glycol-Propyl Glycol (SYSTANE OP), Vitamin D3, AZO Bladder Control/Go-Less, Multiple Vitamins-Minerals (PRESERVISION AREDS 2 PO), acetaminophen, OVER THE COUNTER MEDICATION, Ferrous Sulfate (IRON PO), gabapentin, Apoaequorin (PREVAGEN PO), and torsemide.  No orders of the defined types were placed in this encounter.

## 2022-02-27 NOTE — Progress Notes (Signed)
[facility] [phone]   Fax:([phone]  OFFICE PROGRESS NOTE  [name], MD [place] [address]  DIAGNOSIS: Low-grade myelodysplastic syndrome with ringed sideroblasts.  PRIOR THERAPY: None  CURRENT THERAPY: Observation  INTERVAL HISTORY: Olivia Evans 89 y.o. female returns to the clinic today for follow-up visit accompanied by her daughter.  The patient is feeling fine today with no concerning complaints except for mild fatigue.  She denied having any current chest pain, shortness of breath, cough or hemoptysis.  She has no nausea, vomiting, diarrhea or constipation.  She has no headache or visual changes.  She denied having any recent weight loss or night sweats.  She is here today for evaluation and repeat blood work.   MEDICAL HISTORY: Past Medical History:  Diagnosis Date   Arthritis KNEES   Bladder pain    Chronic constipation    Diverticulosis 2013   Dr Fuller Plan   Dyslipidemia    Heart murmur    Hemorrhoids    History of adenomatous polyp of colon    2003 -- VILLOUS   History of breast cancer 2001---RIGHT BREAST CANCER   S/P PARTIAL MASTECTOMY AND RADIATION--  NO RECURRENCE   History of palpitations    Incomplete right bundle branch block (RBBB)    Interstitial cystitis    Dr McDiarmid   Lower urinary tract symptoms (LUTS)    Normal cardiac stress test    2000  PER MD NOTE   Osteopenia    Wears dentures    full -upper/  partial lower   Wears glasses     ALLERGIES:  is allergic to premarin [conjugated estrogens], vicodin [hydrocodone-acetaminophen], adhesive [tape], fluvastatin sodium, and simvastatin.  MEDICATIONS:  Current Outpatient Medications  Medication Sig Dispense Refill   acetaminophen (TYLENOL) 325 MG tablet Take 325 mg by mouth every 8 (eight) hours as needed for moderate pain.     Apoaequorin (PREVAGEN PO) Take by mouth.     Cholecalciferol (VITAMIN D3) 1000 UNITS CAPS Take 4,000  Units by mouth daily.     Ferrous Sulfate (IRON PO) Take 1 tablet by mouth 2 (two) times daily. Patient takes Mega-Food Blood Builder Iron Minis     gabapentin (NEURONTIN) 100 MG capsule Take 1 capsule (100 mg total) by mouth 2 (two) times daily. (Patient taking differently: Take 100 mg by mouth 2 (two) times daily. As needed) 60 capsule 3   Multiple Vitamins-Minerals (PRESERVISION AREDS 2 PO) Take 1 capsule by mouth in the morning and at bedtime.     OVER THE COUNTER MEDICATION Take 1 tablet by mouth at bedtime as needed (laxitive). PruneLax     Polyethyl Glycol-Propyl Glycol (SYSTANE OP) Place 1 drop into both eyes daily. To both eyes.     Pumpkin Seed-Soy Germ (AZO BLADDER CONTROL/GO-LESS) CAPS Take 1 capsule by mouth daily.     torsemide (DEMADEX) 20 MG tablet Take 1 tablet (20 mg total) by mouth daily. 30 tablet 1   No current facility-administered medications for this visit.    SURGICAL HISTORY:  Past Surgical History:  Procedure Laterality Date   BENIGN TUMOR REMOVED  1960'S   SECOND RIB   CATARACT EXTRACTION W/ INTRAOCULAR LENS  IMPLANT, BILATERAL  feb 2016   CYSTO WITH HYDRODISTENSION  07/29/2011   Procedure: CYSTOSCOPY/HYDRODISTENSION;  Surgeon: Reece Packer, MD;  Location: [facility];  Service: Urology;  Laterality: N/A;  with fulguration of bladder ulcer   CYSTO WITH  HYDRODISTENSION N/A 08/28/2014   Procedure: CYSTO/HYDRODISTENSION, FULGURATION OF ULCERS;  Surgeon: Bjorn Loser, MD;  Location: [facility];  Service: Urology;  Laterality: N/A;   CYSTO/  HYDRODISTENTION/  INSTILLATION THERAPY  x2  01-18- and 11-15- 2011   CYSTO/ HOD/  BLADDER BX'S/  FULGERATION HUNNER ULCER'S /  INSTILLATION THERAPY  10-01-2010   PARTIAL MASTECTOMY W/ SLN DISSECTION Right 12-19-1999   ROTATOR CUFF REPAIR     TUBAL LIGATION     VAGINAL HYSTERECTOMY  1970'S   WITH APPENDECTOMY   VARICOSE VEIN SURGERY      REVIEW OF SYSTEMS:  A comprehensive review of  systems was negative except for: Constitutional: positive for fatigue   PHYSICAL EXAMINATION: General appearance: alert, cooperative, fatigued, and no distress Head: Normocephalic, without obvious abnormality, atraumatic Neck: no adenopathy, no JVD, supple, symmetrical, trachea midline, and thyroid not enlarged, symmetric, no tenderness/mass/nodules Lymph nodes: Cervical, supraclavicular, and axillary nodes normal. Resp: clear to auscultation bilaterally Back: symmetric, no curvature. ROM normal. No CVA tenderness. Cardio: regular rate and rhythm, S1, S2 normal, no murmur, click, rub or gallop GI: soft, non-tender; bowel sounds normal; no masses,  no organomegaly Extremities: extremities normal, atraumatic, no cyanosis or edema  ECOG PERFORMANCE STATUS: 1 - Symptomatic but completely ambulatory  Blood pressure 134/60, pulse 70, temperature 97.6 F (36.4 C), temperature source Oral, resp. rate 16, weight 129 lb (58.5 kg), SpO2 100 %.  LABORATORY DATA: Lab Results  Component Value Date   WBC 3.4 (L) 02/27/2022   HGB 8.9 (L) 02/27/2022   HCT 26.3 (L) 02/27/2022   MCV 111.9 (H) 02/27/2022   PLT 241 02/27/2022      Chemistry      Component Value Date/Time   NA 140 11/06/2021 1452   K 3.9 11/06/2021 1452   CL 105 11/06/2021 1452   CO2 31 11/06/2021 1452   BUN 15 11/06/2021 1452   CREATININE 0.69 11/06/2021 1452   CREATININE 0.77 10/23/2020 1338   CREATININE 0.63 10/14/2019 1501      Component Value Date/Time   CALCIUM 9.7 11/06/2021 1452   ALKPHOS 55 11/06/2021 1452   AST 12 11/06/2021 1452   AST 15 10/23/2020 1338   ALT 9 11/06/2021 1452   ALT 13 10/23/2020 1338   BILITOT 0.6 11/06/2021 1452   BILITOT 0.6 10/23/2020 1338       RADIOGRAPHIC STUDIES: No results found.  ASSESSMENT AND PLAN: This is a very pleasant 89 years old white female recently diagnosed with low-grade myelodysplastic syndrome with ring sideroblasts based on a bone marrow biopsy and aspirate as well  as her clinical presentation with bicytopenia including anemia and leukocytopenia. The patient is currently on observation and she is feeling fine with no concerning complaints. Repeat CBC today showed mild leukocytopenia with total white blood count of 3.4.  She continues to have the anemia with hemoglobin of 8.9 and hematocrit of 26.3%. The patient is mildly symptomatic. I recommended for her to continue on observation with repeat blood work in 6 months. She was advised to call immediately if she has any other concerning symptoms in the interval.  The patient voices understanding of current disease status and treatment options and is in agreement with the current care plan.  All questions were answered. The patient knows to call the clinic with any problems, questions or concerns. We can certainly see the patient much sooner if necessary.  Disclaimer: This note was dictated with voice recognition software. Similar sounding words can inadvertently be transcribed and  may not be corrected upon review.

## 2022-02-27 NOTE — Patient Instructions (Signed)
You may try over the counter Tylenol and if no improvement, take the gabapentin.   If you develop any new or worsening symptoms, let us know or go to the emergency department.   Trigeminal Neuralgia  Trigeminal neuralgia is a nerve disorder that causes severe pain on one side of the face. The pain may last from a few seconds to several minutes, but it can happen hundreds of times a day. The pain is usually only on one side of the face. Symptoms may occur for days, weeks, or months and then go away for months or years. The pain may return and be worse than before. What are the causes? This condition may be caused by: Damage or pressure to a nerve in the head that is called the trigeminal nerve. An attack can be triggered by: Talking or chewing. Putting on makeup. Washing, shaving, or touching your face. Brushing your teeth. Blasts of hot or cold air. Primary demyelinating disorders, such as multiple sclerosis. Tumors. What increases the risk? You are more likely to develop this condition if: You are 41-36 years old. You are female. What are the signs or symptoms? The main symptom of this condition is severe pain in the jaw, lips, eyes, nose, scalp, forehead, and face. How is this diagnosed? This condition is diagnosed with a physical exam. A CT scan or an MRI may be done to rule out other conditions that can cause facial pain. How is this treated? This condition may be treated with: Measures to avoid the things that trigger your symptoms. Prescription medicines such as anticonvulsants. Procedures such as ablation, thermal, or radiation therapy. Cognitive or behavioral therapy. Complementary therapies such as: Gentle, regular exercise or yoga. Meditation. Aromatherapy. Acupuncture. Surgery. This may be done in severe cases if other medical treatment does not provide relief. It may take up to one month for treatment to start relieving the pain. Follow these instructions at  home: Managing pain  Learn as much as you can about how to manage your pain. Ask your health care provider if a pain specialist would be helpful. Consider talking with a mental health care provider about how to cope with the pain. Consider joining a pain support group. General instructions Take over-the-counter and prescription medicines only as told by your health care provider. Avoid the things that trigger your symptoms. It may help to: Chew on the unaffected side of your mouth. Avoid touching your face. Avoid blasts of hot or cold air. Keep all follow-up visits. Where to find more information Facial Pain Association: facepain.org Contact a health care provider if: Your medicine is not helping your symptoms. You have side effects from the medicine used for treatment. You develop new, unexplained symptoms, such as: Double vision. Facial weakness or numbness. Changes in hearing or balance. You feel depressed. Get help right away if: Your pain is severe and is not getting better. You develop suicidal thoughts. If you ever feel like you may hurt yourself or others, or have thoughts about taking your own life, get help right away. Go to your nearest emergency department or: Call your local emergency services (911 in the [place] Call a suicide crisis helpline, such as the [facility] at 224-878-0942 or 988 in the [place]. This is open 24 hours a day in the [place] Text the Crisis Text Line at 512-177-9918 (in the [place].). Summary Trigeminal neuralgia is a nerve disorder that causes severe pain on one side of the face. The pain may last from a  few seconds to several minutes. This condition is caused by damage or pressure to a nerve in the head that is called the trigeminal nerve. Treatment may include avoiding the things that trigger your symptoms, taking medicines, or having procedures or surgery. It may take up to one month for treatment to start relieving the pain. Keep  all follow-up visits. This information is not intended to replace advice given to you by your health care provider. Make sure you discuss any questions you have with your health care provider. Document Revised: 08/02/2020 Document Reviewed: 07/02/2020 Elsevier Patient Education  [place].

## 2022-02-28 DIAGNOSIS — D485 Neoplasm of uncertain behavior of skin: Secondary | ICD-10-CM | POA: Diagnosis not present

## 2022-03-01 ENCOUNTER — Encounter: Payer: Self-pay | Admitting: Family Medicine

## 2022-03-01 DIAGNOSIS — R600 Localized edema: Secondary | ICD-10-CM

## 2022-03-03 ENCOUNTER — Encounter: Payer: Self-pay | Admitting: Internal Medicine

## 2022-03-03 MED ORDER — TORSEMIDE 20 MG PO TABS: 20.0000 mg | ORAL_TABLET | Freq: Every day | ORAL | 1 refills | Status: DC

## 2022-03-03 NOTE — Telephone Encounter (Signed)
Please advise on gabapentin dosage and refill on fluid medication- Demadex as Olivia Evans is out of the office.

## 2022-03-03 NOTE — Telephone Encounter (Signed)
Patient's daughter called back and would like for someone to call her back before the end of the day.

## 2022-03-06 ENCOUNTER — Encounter: Payer: Self-pay | Admitting: Family Medicine

## 2022-03-06 ENCOUNTER — Ambulatory Visit (INDEPENDENT_AMBULATORY_CARE_PROVIDER_SITE_OTHER): Payer: PPO | Admitting: Family Medicine

## 2022-03-06 VITALS — BP 118/64 | HR 85 | Temp 98.0°F | Ht 64.0 in | Wt 127.2 lb

## 2022-03-06 DIAGNOSIS — B0231 Zoster conjunctivitis: Secondary | ICD-10-CM

## 2022-03-06 DIAGNOSIS — B0230 Zoster ocular disease, unspecified: Secondary | ICD-10-CM

## 2022-03-06 MED ORDER — PREDNISONE 20 MG PO TABS: 20.0000 mg | ORAL_TABLET | Freq: Two times a day (BID) | ORAL | 0 refills | Status: AC

## 2022-03-06 MED ORDER — VALACYCLOVIR HCL 1 G PO TABS: 1000.0000 mg | ORAL_TABLET | Freq: Three times a day (TID) | ORAL | 0 refills | Status: AC

## 2022-03-06 NOTE — Progress Notes (Signed)
Established Patient Office Visit   Subjective:  Patient ID: Olivia Evans, female    DOB: [birth year]  Age: 89 y.o. MRN: [id number]  Chief Complaint  Patient presents with   Pain    Pains in neck, face and left eye rash on face possibly shingle. Symptoms x 1 week.     HPI Encounter Diagnoses  Name Primary?   Herpes zoster conjunctivitis Yes   Herpes zoster with ophthalmic complication, unspecified herpes zoster eye disease    3 to 4-day history of a painful rash in the left upper facial area.  It involves the left side of the nose and the upper forehead.  Left eye has been tender with a mild decrease in vision.  Rash was preceded by pain in the left side of her head that started the latter part of last week.  She has a history of occipital neuralgia and the provider that saw her this past week started her on Neurontin.  Primary care physician has recommended increasing dosing with Neurontin.  She is about to start 200 mg 3 times daily.  She is here with her daughter.   Review of Systems  Constitutional: Negative.   HENT: Negative.    Eyes:  Positive for blurred vision, pain, discharge and redness.  Respiratory: Negative.    Cardiovascular: Negative.   Gastrointestinal:  Negative for abdominal pain.  Genitourinary: Negative.   Musculoskeletal: Negative.  Negative for myalgias.  Skin:  Positive for rash.  Neurological:  Negative for tingling, loss of consciousness and weakness.  Endo/Heme/Allergies:  Negative for polydipsia.     Current Outpatient Medications:    acetaminophen (TYLENOL) 325 MG tablet, Take 325 mg by mouth every 8 (eight) hours as needed for moderate pain., Disp: , Rfl:    Apoaequorin (PREVAGEN PO), Take by mouth., Disp: , Rfl:    Cholecalciferol (VITAMIN D3) 1000 UNITS CAPS, Take 4,000 Units by mouth daily., Disp: , Rfl:    Ferrous Sulfate (IRON PO), Take 1 tablet by mouth 2 (two) times daily. Patient takes Mega-Food Blood Builder Iron Minis, Disp: , Rfl:     gabapentin (NEURONTIN) 100 MG capsule, Take 1 capsule (100 mg total) by mouth 2 (two) times daily. (Patient taking differently: Take 100 mg by mouth 2 (two) times daily. As needed), Disp: 60 capsule, Rfl: 3   Multiple Vitamins-Minerals (PRESERVISION AREDS 2 PO), Take 1 capsule by mouth in the morning and at bedtime., Disp: , Rfl:    OVER THE COUNTER MEDICATION, Take 1 tablet by mouth at bedtime as needed (laxitive). PruneLax, Disp: , Rfl:    Polyethyl Glycol-Propyl Glycol (SYSTANE OP), Place 1 drop into both eyes daily. To both eyes., Disp: , Rfl:    predniSONE (DELTASONE) 20 MG tablet, Take 1 tablet (20 mg total) by mouth 2 (two) times daily with a meal for 7 days., Disp: 14 tablet, Rfl: 0   torsemide (DEMADEX) 20 MG tablet, Take 1 tablet (20 mg total) by mouth daily., Disp: 30 tablet, Rfl: 1   valACYclovir (VALTREX) 1000 MG tablet, Take 1 tablet (1,000 mg total) by mouth 3 (three) times daily for 10 days., Disp: 30 tablet, Rfl: 0   Pumpkin Seed-Soy Germ (AZO BLADDER CONTROL/GO-LESS) CAPS, Take 1 capsule by mouth daily. (Patient not taking: Reported on 03/06/2022), Disp: , Rfl:    Objective:     BP 118/64 (BP Location: Left Arm, Patient Position: Sitting, Cuff Size: Normal)   Pulse 85   Temp 98 F (36.7 C) (Temporal)   Ht  $5' 4"k$  (1.626 m)   Wt 127 lb 3.2 oz (57.7 kg)   SpO2 96%   BMI 21.83 kg/m    Physical Exam Constitutional:      General: She is not in acute distress.    Appearance: Normal appearance. She is not ill-appearing, toxic-appearing or diaphoretic.  HENT:     Head: Normocephalic and atraumatic.      Right Ear: External ear normal.     Left Ear: External ear normal.  Eyes:     General: No scleral icterus.       Right eye: No discharge.        Left eye: No discharge.     Extraocular Movements: Extraocular movements intact.     Conjunctiva/sclera:     Left eye: Left conjunctiva is injected.   Pulmonary:     Effort: Pulmonary effort is normal. No respiratory distress.   Skin:    General: Skin is warm and dry.  Neurological:     Mental Status: She is alert and oriented to person, place, and time.     Cranial Nerves: No facial asymmetry.  Psychiatric:        Mood and Affect: Mood normal.        Behavior: Behavior normal.      No results found for any visits on 03/06/22.    The ASCVD Risk score (Arnett DK, et al., 2019) failed to calculate for the following reasons:   The 2019 ASCVD risk score is only valid for ages 25 to 43    Assessment & Plan:   Herpes zoster conjunctivitis  Herpes zoster with ophthalmic complication, unspecified herpes zoster eye disease -     valACYclovir HCl; Take 1 tablet (1,000 mg total) by mouth 3 (three) times daily for 10 days.  Dispense: 30 tablet; Refill: 0 -     predniSONE; Take 1 tablet (20 mg total) by mouth 2 (two) times daily with a meal for 7 days.  Dispense: 14 tablet; Refill: 0    No follow-ups on file.  Start Valtrex 1 g 3 times daily for 10 days.  Prednisone 10 mg twice daily for 7 days.  She has follow-up scheduled with her eye doctor tomorrow morning at 9:00.  May continue upward tapering of Neurontin up to 300 mg 3 times daily.  Libby Maw, MD

## 2022-03-07 DIAGNOSIS — B0239 Other herpes zoster eye disease: Secondary | ICD-10-CM | POA: Diagnosis not present

## 2022-03-25 MED ORDER — GABAPENTIN 100 MG PO CAPS: 200.0000 mg | ORAL_CAPSULE | Freq: Three times a day (TID) | ORAL | 0 refills | Status: DC

## 2022-03-25 NOTE — Addendum Note (Signed)
Addended by: [name] on: 03/25/2022 09:19 PM   Modules accepted: Orders

## 2022-03-27 DIAGNOSIS — H20022 Recurrent acute iridocyclitis, left eye: Secondary | ICD-10-CM | POA: Diagnosis not present

## 2022-04-03 DIAGNOSIS — H20022 Recurrent acute iridocyclitis, left eye: Secondary | ICD-10-CM | POA: Diagnosis not present

## 2022-04-03 LAB — HM DIABETES EYE EXAM

## 2022-04-10 ENCOUNTER — Encounter: Payer: Self-pay | Admitting: Internal Medicine

## 2022-04-10 NOTE — Progress Notes (Signed)
Outside notes received. Information abstracted. Notes sent to scan.  

## 2022-05-04 ENCOUNTER — Other Ambulatory Visit: Payer: Self-pay | Admitting: Internal Medicine

## 2022-05-04 DIAGNOSIS — R600 Localized edema: Secondary | ICD-10-CM

## 2022-05-05 ENCOUNTER — Other Ambulatory Visit: Payer: Self-pay

## 2022-05-08 ENCOUNTER — Telehealth: Payer: Self-pay

## 2022-05-08 NOTE — Telephone Encounter (Signed)
Called patient to schedule Medicare Annual Wellness Visit (AWV). Left message for patient to call back and schedule Medicare Annual Wellness Visit (AWV).  Last date of AWV: 04/25/21  Please schedule an appointment at any time on Annual Wellness Visit schedule.

## 2022-05-27 DIAGNOSIS — H16223 Keratoconjunctivitis sicca, not specified as Sjogren's, bilateral: Secondary | ICD-10-CM | POA: Diagnosis not present

## 2022-05-27 DIAGNOSIS — H04123 Dry eye syndrome of bilateral lacrimal glands: Secondary | ICD-10-CM | POA: Diagnosis not present

## 2022-05-30 ENCOUNTER — Other Ambulatory Visit: Payer: Self-pay | Admitting: Internal Medicine

## 2022-05-30 DIAGNOSIS — R600 Localized edema: Secondary | ICD-10-CM

## 2022-06-03 ENCOUNTER — Telehealth: Payer: Self-pay | Admitting: Internal Medicine

## 2022-06-03 NOTE — Telephone Encounter (Signed)
Contacted Olivia Evans to schedule their annual wellness visit. Appointment made for 06/19/2022.  [facility] Care Guide [facility] AWV TEAM Direct Dial: [phone]

## 2022-06-03 NOTE — Telephone Encounter (Signed)
Contacted Giana H Folse to schedule their annual wellness visit. Appointment made for 06/19/2022.  [facility] Care Guide [facility] AWV TEAM Direct Dial: [phone]    

## 2022-06-04 ENCOUNTER — Telehealth: Payer: Self-pay | Admitting: Internal Medicine

## 2022-06-04 NOTE — Telephone Encounter (Signed)
Contacted Olivia Evans to schedule their annual wellness visit. Appointment made for 06/23/2022.  [facility] Care Guide [facility] AWV TEAM Direct Dial: [phone]

## 2022-06-24 ENCOUNTER — Ambulatory Visit (INDEPENDENT_AMBULATORY_CARE_PROVIDER_SITE_OTHER): Payer: PPO | Admitting: Family Medicine

## 2022-06-24 DIAGNOSIS — Z0000 Encounter for general adult medical examination without abnormal findings: Secondary | ICD-10-CM

## 2022-06-24 NOTE — Patient Instructions (Signed)
I really enjoyed getting to talk with you today! I am available on Tuesdays and Thursdays for virtual visits if you have any questions or concerns, or if I can be of any further assistance.   CHECKLIST FROM ANNUAL WELLNESS VISIT:  -Follow up (please call to schedule if not scheduled after visit):   -yearly for annual wellness visit with primary care office  Here is a list of your preventive care/health maintenance measures and the plan for each if any are due:  PLAN For any measures below that may be due:  -can get the covid boosters yearly and the shingles vaccines (2 doses) at the pharmacy  Health Maintenance  Topic Date Due   Zoster Vaccines- Shingrix (1 of 2) Never done   COVID-19 Vaccine (3 - Moderna risk series) 10/17/2019   INFLUENZA VACCINE  08/21/2022   Medicare Annual Wellness (AWV)  06/24/2023   DEXA SCAN  11/07/2023   Pneumonia Vaccine 45+ Years old  Completed   HPV VACCINES  Aged Out   DTaP/Tdap/Td  Discontinued    -See a dentist at least yearly  -Get your eyes checked and then per your eye specialist's recommendations  -Other issues addressed today:   -I have included below further information regarding a healthy whole foods based diet, physical activity guidelines for adults, stress management and opportunities for social connections. I hope you find this information useful.   -----------------------------------------------------------------------------------------------------------------------------------------------------------------------------------------------------------------------------------------------------------  NUTRITION: -eat real food: lots of colorful vegetables (half the plate) and fruits -5-7 servings of vegetables and fruits per day (fresh or steamed is best), exp. 2 servings of vegetables with lunch and dinner and 2 servings of fruit per day. Berries and greens such as kale and collards are great choices.  -consume on a regular basis: whole  grains (make sure first ingredient on label contains the word "whole"), fresh fruits, fish, nuts, seeds, healthy oils (such as olive oil, avocado oil, grape seed oil) -may eat small amounts of dairy and lean meat on occasion, but avoid processed meats such as ham, bacon, lunch meat, etc. -drink water -try to avoid fast food and pre-packaged foods, processed meat -most experts advise limiting sodium to < 2300mg  per day, should limit further is any chronic conditions such as high blood pressure, heart disease, diabetes, etc. The American Heart Association advised that < 1500mg  is is ideal -try to avoid foods that contain any ingredients with names you do not recognize  -try to avoid sugar/sweets (except for the natural sugar that occurs in fresh fruit) -try to avoid sweet drinks -try to avoid white rice, white bread, pasta (unless whole grain), white or yellow potatoes  EXERCISE GUIDELINES FOR ADULTS: -if you wish to increase your physical activity, do so gradually and with the approval of your doctor -STOP and seek medical care immediately if you have any chest pain, chest discomfort or trouble breathing when starting or increasing exercise  -move and stretch your body, legs, feet and arms when sitting for long periods -Physical activity guidelines for optimal health in adults: -least 150 minutes per week of aerobic exercise (can talk, but not sing) once approved by your doctor, 20-30 minutes of sustained activity or two 10 minute episodes of sustained activity every day.  -resistance training at least 2 days per week if approved by your doctor -balance exercises 3+ days per week:   Stand somewhere where you have something sturdy to hold onto if you lose balance.    1) lift up on toes, start with 5x per  day and work up to 20x   2) stand and lift on leg straight out to the side so that foot is a few inches of the floor, start with 5x each side and work up to 20x each side   3) stand on one foot,  start with 5 seconds each side and work up to 20 seconds on each side  If you need ideas or help with getting more active:  -Silver sneakers https://tools.silversneakers.com  -Walk with a Doc: http://www.duncan-williams.com/  -try to include resistance (weight lifting/strength building) and balance exercises twice per week: or the following link for ideas: http://castillo-powell.com/  BuyDucts.dk  STRESS MANAGEMENT: -can try meditating, or just sitting quietly with deep breathing while intentionally relaxing all parts of your body for 5 minutes daily -if you need further help with stress, anxiety or depression please follow up with your primary doctor or contact the wonderful folks at [facility]: [phone]  SOCIAL CONNECTIONS: -options in [place] if you wish to engage in more social and exercise related activities:  -Silver sneakers https://tools.silversneakers.com  -Walk with a Doc: http://www.duncan-williams.com/  -Check out the [facility] Active Adults 50+ section on the [place] of [employer] (hiking clubs, book clubs, cards and games, chess, exercise classes, aquatic classes and much more) - see the website for details: https://www.[place].gov/departments/parks-recreation/active-adults50  -YouTube has lots of exercise videos for different ages and abilities as well  -[facility] (a variety of indoor and outdoor inperson activities for adults). [phone]. [address].  -Virtual Online Classes (a variety of topics): see seniorplanet.org or call [phone]  -consider volunteering at a school, hospice center, church, senior center or elsewhere

## 2022-06-24 NOTE — Progress Notes (Signed)
PATIENT CHECK-IN and HEALTH RISK ASSESSMENT QUESTIONNAIRE:  -completed by phone/video for upcoming Medicare Preventive Visit  Pre-Visit Check-in: 1)Vitals (height, wt, BP, etc) - record in vitals section for visit on day of visit 2)Review and Update Medications, Allergies PMH, Surgeries, Social history in Epic 3)Hospitalizations in the last year with date/reason?  No   4)Review and Update Care Team (patient's specialists) in Epic 5) Complete PHQ9 in Epic  6) Complete Fall Screening in Epic 7)Review all Health Maintenance Due and order under PCP if not done.  8)Medicare Wellness Questionnaire: Answer theses question about your habits: Do you drink alcohol? No   Have you ever smoked?no  How many packs a day do/did you smoke? N/a Do you use smokeless tobacco?no  Do you use an illicit drugs?no  Do you exercises? Walks outside a few days a week, also walks around her house  Are you sexually active? No Typical breakfast varies - oatmeal, eggs Typical lunch varies -usually when has big lunch, no dinner Typical dinner varies Typical snacks: peanut butter crackers, boost (occasionally as a snack)  Beverages: water, sweet tea, occasional soda Lives alone, does well and daughter and son around to help   Answer theses question about you: Can you perform most household chores? yes Do you find it hard to follow a conversation in a noisy room? Has hearing aids but doesn't feel needs unless out Do you often ask people to speak up or repeat themselves? some Do you feel that you have a problem with memory? Some - but doing ok, daughter feels is excellent with her memory Do you balance your checkbook and or bank acounts?yes Do you feel safe at home?yes Last dentist visit? Last Thursday Do you need assistance with any of the following: Please note if so - none  Driving? Can drive but prefers not to so daughter does most of the time, she does some driving.   Feeding yourself?  Getting from bed to  chair?  Getting to the toilet?  Bathing or showering?  Dressing yourself?  Managing money?  Climbing a flight of stairs  Preparing meals?  Do you have Advanced Directives in place (Living Will, Healthcare Power or Attorney)?  yes   Last eye Exam and location? March 2024   Do you currently use prescribed or non-prescribed narcotic or opioid pain medications? no   Do you have a history or close family history of breast, ovarian, tubal or peritoneal cancer or a family member with BRCA (breast cancer susceptibility 1 and 2) gene mutations? Has had breast cancer 20 years .  Nurse/Assistant Credentials/time stamp: Tora Perches CMA 11:58 am    ----------------------------------------------------------------------------------------------------------------------------------------------------------------------------------------------------------------------   MEDICARE ANNUAL PREVENTIVE VISIT WITH PROVIDER: (Welcome to Medicare, initial annual wellness or annual wellness exam)  Virtual Visit via Phone Note  I connected with Sawyer H Weld on 06/24/22 by phone  and verified that I am speaking with the correct person using two identifiers.  Location patient: home Location provider:work or home office Persons participating in the virtual visit: patient, provider, daughter Kendal Hymen  Concerns and/or follow up today: stable, no concerns - recovering from shingles and almost resolved.   See HM section in Epic for other details of completed HM.    ROS: negative for report of fevers, unintentional weight loss, vision changes, vision loss, hearing loss or change, chest pain, sob, hemoptysis, melena, hematochezia, hematuria, falls, bleeding or bruising, thoughts of suicide or self harm, memory loss  Patient-completed extensive health risk assessment - reviewed and discussed with  the patient: See Health Risk Assessment completed with patient prior to the visit either above or in recent phone note. This  was reviewed in detailed with the patient today and appropriate recommendations, orders and referrals were placed as needed per Summary below and patient instructions.   Review of Medical History: -PMH, PSH, Family History and current specialty and care providers reviewed and updated and listed below   Patient Care Team: Pincus Sanes, MD as PCP - General (Internal Medicine) Mia Creek, MD as Consulting Physician (Ophthalmology) [place], Heather, OD as Consulting Physician (Optometry) Ginette Otto, [facility] as Consulting Physician (Obstetrics and Gynecology)   Past Medical History:  Diagnosis Date   Arthritis KNEES   Bladder pain    Chronic constipation    Diverticulosis 2013   Dr Russella Dar   Dyslipidemia    Heart murmur    Hemorrhoids    History of adenomatous polyp of colon    2003 -- VILLOUS   History of breast cancer 2001---RIGHT BREAST CANCER   S/P PARTIAL MASTECTOMY AND RADIATION--  NO RECURRENCE   History of palpitations    Incomplete right bundle branch block (RBBB)    Interstitial cystitis    Dr McDiarmid   Lower urinary tract symptoms (LUTS)    Normal cardiac stress test    2000  PER MD NOTE   Osteopenia    Wears dentures    full -upper/  partial lower   Wears glasses     Past Surgical History:  Procedure Laterality Date   BENIGN TUMOR REMOVED  1960'S   SECOND RIB   CATARACT EXTRACTION W/ INTRAOCULAR LENS  IMPLANT, BILATERAL  feb 2016   CYSTO WITH HYDRODISTENSION  07/29/2011   Procedure: CYSTOSCOPY/HYDRODISTENSION;  Surgeon: Martina Sinner, MD;  Location: [facility];  Service: Urology;  Laterality: N/A;  with fulguration of bladder ulcer   CYSTO WITH HYDRODISTENSION N/A 08/28/2014   Procedure: CYSTO/HYDRODISTENSION, FULGURATION OF ULCERS;  Surgeon: Alfredo Martinez, MD;  Location: [facility] [place];  Service: Urology;  Laterality: N/A;   CYSTO/  HYDRODISTENTION/  INSTILLATION THERAPY  x2  01-18- and 11-15- 2011   CYSTO/  HOD/  BLADDER BX'S/  FULGERATION HUNNER ULCER'S /  INSTILLATION THERAPY  10-01-2010   PARTIAL MASTECTOMY W/ SLN DISSECTION Right 12-19-1999   ROTATOR CUFF REPAIR     TUBAL LIGATION     VAGINAL HYSTERECTOMY  1970'S   WITH APPENDECTOMY   VARICOSE VEIN SURGERY      Social History   Socioeconomic History   Marital status: Widowed    Spouse name: Not on file   Number of children: 4   Years of education: Not on file   Highest education level: Not on file  Occupational History   Occupation: Retired  Tobacco Use   Smoking status: Never   Smokeless tobacco: Never  Vaping Use   Vaping Use: Never used  Substance and Sexual Activity   Alcohol use: No    Alcohol/week: 0.0 standard drinks of alcohol   Drug use: No   Sexual activity: Not on file  Other Topics Concern   Not on file  Social History Narrative   Are you right handed or left handed? RIght   Are you currently employed ? NO   What is your current occupation? Retired   Do you live at home alone? yes   Who lives with you?    What type of home do you live in: 1 story or 2 story? 1  Social Determinants of Health   Financial Resource Strain: Low Risk  (04/25/2021)   Overall Financial Resource Strain (CARDIA)    Difficulty of Paying Living Expenses: Not hard at all  Food Insecurity: No Food Insecurity (04/25/2021)   Hunger Vital Sign    Worried About Running Out of Food in the Last Year: Never true    Ran Out of Food in the Last Year: Never true  Transportation Needs: No Transportation Needs (04/25/2021)   PRAPARE - Administrator, Civil Service (Medical): No    Lack of Transportation (Non-Medical): No  Physical Activity: Sufficiently Active (04/25/2021)   Exercise Vital Sign    Days of Exercise per Week: 5 days    Minutes of Exercise per Session: 30 min  Stress: No Stress Concern Present (04/25/2021)   [employer] of Occupational Health - Occupational Stress Questionnaire    Feeling of Stress : Not at all   Social Connections: Moderately Integrated (04/25/2021)   Social Connection and Isolation Panel [NHANES]    Frequency of Communication with Friends and Family: More than three times a week    Frequency of Social Gatherings with Friends and Family: More than three times a week    Attends Religious Services: More than 4 times per year    Active Member of [employer] or Organizations: No    Attends Engineer, structural: More than 4 times per year    Marital Status: Widowed  Intimate Partner Violence: Not At Risk (04/25/2021)   Humiliation, Afraid, Rape, and Kick questionnaire    Fear of Current or Ex-Partner: No    Emotionally Abused: No    Physically Abused: No    Sexually Abused: No    Family History  Problem Relation Age of Onset   Heart disease Mother         CHF; no MI   Dementia Sister    Breast cancer Sister        S/P bilateral mastectomy   Stroke Brother 76   Diabetes Maternal Grandmother    Thyroid disease Other        3 daughters   Colon cancer Neg Hx    Esophageal cancer Neg Hx    Stomach cancer Neg Hx    Rectal cancer Neg Hx     Current Outpatient Medications on File Prior to Visit  Medication Sig Dispense Refill   acetaminophen (TYLENOL) 325 MG tablet Take 325 mg by mouth every 8 (eight) hours as needed for moderate pain.     Apoaequorin (PREVAGEN PO) Take by mouth.     Cholecalciferol (VITAMIN D3) 1000 UNITS CAPS Take 4,000 Units by mouth daily.     Ferrous Sulfate (IRON PO) Take 1 tablet by mouth 2 (two) times daily. Patient takes Mega-Food Blood Builder Iron Minis     gabapentin (NEURONTIN) 100 MG capsule Take 2 capsules (200 mg total) by mouth 3 (three) times daily. 540 capsule 0   Multiple Vitamins-Minerals (PRESERVISION AREDS 2 PO) Take 1 capsule by mouth in the morning and at bedtime.     OVER THE COUNTER MEDICATION Take 1 tablet by mouth at bedtime as needed (laxitive). PruneLax     Polyethyl Glycol-Propyl Glycol (SYSTANE OP) Place 1 drop into both eyes  daily. To both eyes.     torsemide (DEMADEX) 20 MG tablet TAKE 1 TABLET BY MOUTH EVERY DAY 90 tablet 1   No current facility-administered medications on file prior to visit.    Allergies  Allergen Reactions  Premarin [Conjugated Estrogens] Other (See Comments)    TABS--  CAUSE PHLEBITIS   Vicodin [Hydrocodone-Acetaminophen] Itching    SEVERE   Adhesive [Tape] Other (See Comments)    IRRITATION   Fluvastatin Sodium Other (See Comments)    HEART PALPITATIONS   Simvastatin Other (See Comments)    SEVERE LEG ACHES       Physical Exam There were no vitals filed for this visit. Estimated body mass index is 21.83 kg/m as calculated from the following:   Height as of 03/06/22: 5\' 4"  (1.626 m).   Weight as of 03/06/22: 127 lb 3.2 oz (57.7 kg).  EKG (optional): deferred due to virtual visit  GENERAL: alert, oriented, no acute distress detected, full vision exam deferred due to pandemic and/or virtual encounter  PSYCH/NEURO: pleasant and cooperative, no obvious depression or anxiety, speech and thought processing grossly intact, Cognitive function grossly intact        06/24/2022   12:14 PM 03/06/2022    4:16 PM 10/04/2021    4:13 PM 04/25/2021   10:52 AM 04/12/2020   10:46 AM  Depression screen PHQ 2/9  Decreased Interest 0 0 0 0 0  Down, Depressed, Hopeless 0 0 0 0 0  PHQ - 2 Score 0 0 0 0 0       10/04/2021    4:13 PM 10/04/2021    4:52 PM 02/27/2022    4:26 PM 03/06/2022    4:16 PM 06/24/2022   12:08 PM  Fall Risk  Falls in the past year? 0 0 0 0 0  Was there an injury with Fall? 0 0 0 0 0  Fall Risk Category Calculator 0 0 0 0 0  Fall Risk Category (Retired) Low Low     (RETIRED) Patient Fall Risk Level Low fall risk Low fall risk     Patient at Risk for Falls Due to No Fall Risks No Fall Risks No Fall Risks No Fall Risks   Fall risk Follow up Falls evaluation completed Falls evaluation completed Falls evaluation completed Falls evaluation completed      SUMMARY AND  PLAN:  Encounter for Medicare annual wellness exam  Discussed applicable health maintenance/preventive health measures and advised and referred or ordered per patient preferences: -discussed shingles vaccine and covid boosters and recs, can get at the pharmacy if wishes  Health Maintenance  Topic Date Due   Zoster Vaccines- Shingrix (1 of 2) Never done   COVID-19 Vaccine (3 - Moderna risk series) 10/17/2019   INFLUENZA VACCINE  08/21/2022   Medicare Annual Wellness (AWV)  06/24/2023   DEXA SCAN  11/07/2023   Pneumonia Vaccine 93+ Years old  Completed   HPV VACCINES  Aged Out   DTaP/Tdap/Td  Discontinued     Education and counseling on the following was provided based on the above review of health and a plan/checklist for the patient, along with additional information discussed, was provided for the patient in the patient instructions :   -Provided counseling and plan for difficulty hearing - she and daughter feels is doing well and hearing aids work great when she needs them per their report -Provided counseling and plan for increased risk of falling if applicable per above screening. Reviewed and demonstrated safe balance exercises that can be done at home to improve balance and discussed exercise guidelines for adults with include balance exercises at least 3 days per week.  -Advised and counseled on a healthy lifestyle - including the importance of a healthy diet, regular physical  activity, social connections and stress management. -Reviewed patient's current diet. Advised and counseled on a whole foods based healthy diet. A summary of a healthy diet was provided in the Patient Instructions.  -reviewed patient's current physical activity level and discussed exercise guidelines for adults. Discussed ideas for safe exercise at home to assist in meeting exercise guideline recommendations in a safe and healthy way.  -Advise yearly dental visits at minimum and regular eye exams   Follow  up: see patient instructions     Patient Instructions  I really enjoyed getting to talk with you today! I am available on Tuesdays and Thursdays for virtual visits if you have any questions or concerns, or if I can be of any further assistance.   CHECKLIST FROM ANNUAL WELLNESS VISIT:  -Follow up (please call to schedule if not scheduled after visit):   -yearly for annual wellness visit with primary care office  Here is a list of your preventive care/health maintenance measures and the plan for each if any are due:  PLAN For any measures below that may be due:  -can get the covid boosters yearly and the shingles vaccines (2 doses) at the pharmacy  Health Maintenance  Topic Date Due   Zoster Vaccines- Shingrix (1 of 2) Never done   COVID-19 Vaccine (3 - Moderna risk series) 10/17/2019   INFLUENZA VACCINE  08/21/2022   Medicare Annual Wellness (AWV)  06/24/2023   DEXA SCAN  11/07/2023   Pneumonia Vaccine 63+ Years old  Completed   HPV VACCINES  Aged Out   DTaP/Tdap/Td  Discontinued    -See a dentist at least yearly  -Get your eyes checked and then per your eye specialist's recommendations  -Other issues addressed today:   -I have included below further information regarding a healthy whole foods based diet, physical activity guidelines for adults, stress management and opportunities for social connections. I hope you find this information useful.   -----------------------------------------------------------------------------------------------------------------------------------------------------------------------------------------------------------------------------------------------------------  NUTRITION: -eat real food: lots of colorful vegetables (half the plate) and fruits -5-7 servings of vegetables and fruits per day (fresh or steamed is best), exp. 2 servings of vegetables with lunch and dinner and 2 servings of fruit per day. Berries and greens such as kale and  collards are great choices.  -consume on a regular basis: whole grains (make sure first ingredient on label contains the word "whole"), fresh fruits, fish, nuts, seeds, healthy oils (such as olive oil, avocado oil, grape seed oil) -may eat small amounts of dairy and lean meat on occasion, but avoid processed meats such as ham, bacon, lunch meat, etc. -drink water -try to avoid fast food and pre-packaged foods, processed meat -most experts advise limiting sodium to < 2300mg  per day, should limit further is any chronic conditions such as high blood pressure, heart disease, diabetes, etc. The American Heart Association advised that < 1500mg  is is ideal -try to avoid foods that contain any ingredients with names you do not recognize  -try to avoid sugar/sweets (except for the natural sugar that occurs in fresh fruit) -try to avoid sweet drinks -try to avoid white rice, white bread, pasta (unless whole grain), white or yellow potatoes  EXERCISE GUIDELINES FOR ADULTS: -if you wish to increase your physical activity, do so gradually and with the approval of your doctor -STOP and seek medical care immediately if you have any chest pain, chest discomfort or trouble breathing when starting or increasing exercise  -move and stretch your body, legs, feet and arms when sitting for  long periods -Physical activity guidelines for optimal health in adults: -least 150 minutes per week of aerobic exercise (can talk, but not sing) once approved by your doctor, 20-30 minutes of sustained activity or two 10 minute episodes of sustained activity every day.  -resistance training at least 2 days per week if approved by your doctor -balance exercises 3+ days per week:   Stand somewhere where you have something sturdy to hold onto if you lose balance.    1) lift up on toes, start with 5x per day and work up to 20x   2) stand and lift on leg straight out to the side so that foot is a few inches of the floor, start with 5x  each side and work up to 20x each side   3) stand on one foot, start with 5 seconds each side and work up to 20 seconds on each side  If you need ideas or help with getting more active:  -Silver sneakers https://tools.silversneakers.com  -Walk with a Doc: http://www.duncan-williams.com/  -try to include resistance (weight lifting/strength building) and balance exercises twice per week: or the following link for ideas: http://castillo-powell.com/  BuyDucts.dk  STRESS MANAGEMENT: -can try meditating, or just sitting quietly with deep breathing while intentionally relaxing all parts of your body for 5 minutes daily -if you need further help with stress, anxiety or depression please follow up with your primary doctor or contact the wonderful folks at [facility]: [phone]  SOCIAL CONNECTIONS: -options in [place] if you wish to engage in more social and exercise related activities:  -Silver sneakers https://tools.silversneakers.com  -Walk with a Doc: http://www.duncan-williams.com/  -Check out the [facility] Active Adults 50+ section on the [employer] (hiking clubs, book clubs, cards and games, chess, exercise classes, aquatic classes and much more) - see the website for details: https://www.[place].gov/departments/parks-recreation/active-adults50  -YouTube has lots of exercise videos for different ages and abilities as well  -[facility] (a variety of indoor and outdoor inperson activities for adults). ([phone]) [phone]. [address]  -Virtual Online Classes (a variety of topics): see seniorplanet.org or call (223) [phone]  -consider volunteering at a school, hospice center, church, senior center or elsewhere           Terressa Koyanagi, DO

## 2022-08-28 ENCOUNTER — Inpatient Hospital Stay: Payer: PPO | Attending: Internal Medicine

## 2022-08-28 ENCOUNTER — Other Ambulatory Visit: Payer: Self-pay

## 2022-08-28 ENCOUNTER — Inpatient Hospital Stay: Payer: PPO | Admitting: Internal Medicine

## 2022-08-28 VITALS — BP 126/58 | HR 76 | Temp 97.9°F | Resp 16 | Wt 130.0 lb

## 2022-08-28 DIAGNOSIS — D461 Refractory anemia with ring sideroblasts: Secondary | ICD-10-CM | POA: Insufficient documentation

## 2022-08-28 DIAGNOSIS — D469 Myelodysplastic syndrome, unspecified: Secondary | ICD-10-CM

## 2022-08-28 LAB — CBC WITH DIFFERENTIAL (CANCER CENTER ONLY)
Abs Immature Granulocytes: 0.02 10*3/uL (ref 0.00–0.07)
Basophils Absolute: 0.1 10*3/uL (ref 0.0–0.1)
Basophils Relative: 1 %
Eosinophils Absolute: 0.2 10*3/uL (ref 0.0–0.5)
Eosinophils Relative: 4 %
HCT: 27.8 % — ABNORMAL LOW (ref 36.0–46.0)
Hemoglobin: 9.2 g/dL — ABNORMAL LOW (ref 12.0–15.0)
Immature Granulocytes: 0 %
Lymphocytes Relative: 34 %
Lymphs Abs: 1.6 10*3/uL (ref 0.7–4.0)
MCH: 35.8 pg — ABNORMAL HIGH (ref 26.0–34.0)
MCHC: 33.1 g/dL (ref 30.0–36.0)
MCV: 108.2 fL — ABNORMAL HIGH (ref 80.0–100.0)
Monocytes Absolute: 0.3 10*3/uL (ref 0.1–1.0)
Monocytes Relative: 6 %
Neutro Abs: 2.5 10*3/uL (ref 1.7–7.7)
Neutrophils Relative %: 55 %
Platelet Count: 272 10*3/uL (ref 150–400)
RBC: 2.57 MIL/uL — ABNORMAL LOW (ref 3.87–5.11)
RDW: 14.6 % (ref 11.5–15.5)
WBC Count: 4.6 10*3/uL (ref 4.0–10.5)
nRBC: 0 % (ref 0.0–0.2)

## 2022-08-28 LAB — CMP (CANCER CENTER ONLY)
ALT: 11 U/L (ref 0–44)
AST: 14 U/L — ABNORMAL LOW (ref 15–41)
Albumin: 4.1 g/dL (ref 3.5–5.0)
Alkaline Phosphatase: 64 U/L (ref 38–126)
Anion gap: 5 (ref 5–15)
BUN: 15 mg/dL (ref 8–23)
CO2: 32 mmol/L (ref 22–32)
Calcium: 9.8 mg/dL (ref 8.9–10.3)
Chloride: 103 mmol/L (ref 98–111)
Creatinine: 0.75 mg/dL (ref 0.44–1.00)
GFR, Estimated: >60 mL/min (ref >60)
Glucose, Bld: 95 mg/dL (ref 70–99)
Potassium: 4.1 mmol/L (ref 3.5–5.1)
Sodium: 140 mmol/L (ref 135–145)
Total Bilirubin: 0.6 mg/dL (ref 0.3–1.2)
Total Protein: 6.3 g/dL — ABNORMAL LOW (ref 6.5–8.1)

## 2022-08-28 LAB — IRON AND IRON BINDING CAPACITY (CC-WL,HP ONLY)
Iron: 91 ug/dL (ref 28–170)
Saturation Ratios: 36 % — ABNORMAL HIGH (ref 10.4–31.8)
TIBC: 252 ug/dL (ref 250–450)
UIBC: 161 ug/dL (ref 148–442)

## 2022-08-28 NOTE — Progress Notes (Signed)
[facility] [phone]   [phone]  OFFICE PROGRESS NOTE  Pincus Sanes, MD [address] [place] [address]  DIAGNOSIS: Low-grade myelodysplastic syndrome with ringed sideroblasts.  PRIOR THERAPY: None  CURRENT THERAPY: Observation  INTERVAL HISTORY: Olivia Evans 89 y.o. female returns to the clinic today for follow-up visit accompanied by her daughter.  The patient is feeling fine today with no concerning complaints.  She continues to have mild fatigue but no chest pain, shortness of breath, cough or hemoptysis.  She has no nausea, vomiting, diarrhea or constipation.  She has no headache or visual changes.  She is here today for evaluation and repeat blood work.     MEDICAL HISTORY: Past Medical History:  Diagnosis Date   Arthritis KNEES   Bladder pain    Chronic constipation    Diverticulosis 2013   Dr Russella Dar   Dyslipidemia    Heart murmur    Hemorrhoids    History of adenomatous polyp of colon    2003 -- VILLOUS   History of breast cancer 2001---RIGHT BREAST CANCER   S/P PARTIAL MASTECTOMY AND RADIATION--  NO RECURRENCE   History of palpitations    Incomplete right bundle branch block (RBBB)    Interstitial cystitis    Dr McDiarmid   Lower urinary tract symptoms (LUTS)    Normal cardiac stress test    2000  PER MD NOTE   Osteopenia    Wears dentures    full -upper/  partial lower   Wears glasses     ALLERGIES:  is allergic to premarin [conjugated estrogens], vicodin [hydrocodone-acetaminophen], adhesive [tape], fluvastatin sodium, and simvastatin.  MEDICATIONS:  Current Outpatient Medications  Medication Sig Dispense Refill   acetaminophen (TYLENOL) 325 MG tablet Take 325 mg by mouth every 8 (eight) hours as needed for moderate pain.     Apoaequorin (PREVAGEN PO) Take by mouth.     Cholecalciferol (VITAMIN D3) 1000 UNITS CAPS Take 4,000 Units by mouth daily.     Ferrous Sulfate (IRON PO) Take 1 tablet by  mouth 2 (two) times daily. Patient takes Mega-Food Blood Builder Iron Minis     gabapentin (NEURONTIN) 100 MG capsule Take 2 capsules (200 mg total) by mouth 3 (three) times daily. 540 capsule 0   Multiple Vitamins-Minerals (PRESERVISION AREDS 2 PO) Take 1 capsule by mouth in the morning and at bedtime.     OVER THE COUNTER MEDICATION Take 1 tablet by mouth at bedtime as needed (laxitive). PruneLax     Polyethyl Glycol-Propyl Glycol (SYSTANE OP) Place 1 drop into both eyes daily. To both eyes.     torsemide (DEMADEX) 20 MG tablet TAKE 1 TABLET BY MOUTH EVERY DAY 90 tablet 1   No current facility-administered medications for this visit.    SURGICAL HISTORY:  Past Surgical History:  Procedure Laterality Date   BENIGN TUMOR REMOVED  1960'S   SECOND RIB   CATARACT EXTRACTION W/ INTRAOCULAR LENS  IMPLANT, BILATERAL  feb 2016   CYSTO WITH HYDRODISTENSION  07/29/2011   Procedure: CYSTOSCOPY/HYDRODISTENSION;  Surgeon: Martina Sinner, MD;  Location: [facility];  Service: Urology;  Laterality: N/A;  with fulguration of bladder ulcer   CYSTO WITH HYDRODISTENSION N/A 08/28/2014   Procedure: CYSTO/HYDRODISTENSION, FULGURATION OF ULCERS;  Surgeon: Alfredo Martinez, MD;  Location: [facility];  Service: Urology;  Laterality: N/A;   CYSTO/  HYDRODISTENTION/  INSTILLATION THERAPY  x2  01-18- and 11-15- 2011  CYSTO/ HOD/  BLADDER BX'S/  FULGERATION HUNNER ULCER'S /  INSTILLATION THERAPY  10-01-2010   PARTIAL MASTECTOMY W/ SLN DISSECTION Right 12-19-1999   ROTATOR CUFF REPAIR     TUBAL LIGATION     VAGINAL HYSTERECTOMY  1970'S   WITH APPENDECTOMY   VARICOSE VEIN SURGERY      REVIEW OF SYSTEMS:  A comprehensive review of systems was negative except for: Constitutional: positive for fatigue   PHYSICAL EXAMINATION: General appearance: alert, cooperative, fatigued, and no distress Head: Normocephalic, without obvious abnormality, atraumatic Neck: no adenopathy, no JVD,  supple, symmetrical, trachea midline, and thyroid not enlarged, symmetric, no tenderness/mass/nodules Lymph nodes: Cervical, supraclavicular, and axillary nodes normal. Resp: clear to auscultation bilaterally Back: symmetric, no curvature. ROM normal. No CVA tenderness. Cardio: regular rate and rhythm, S1, S2 normal, no murmur, click, rub or gallop GI: soft, non-tender; bowel sounds normal; no masses,  no organomegaly Extremities: extremities normal, atraumatic, no cyanosis or edema  ECOG PERFORMANCE STATUS: 1 - Symptomatic but completely ambulatory  Blood pressure (!) 126/58, pulse 76, temperature 97.9 F (36.6 C), temperature source Oral, resp. rate 16, weight 130 lb (59 kg), SpO2 97%.  LABORATORY DATA: Lab Results  Component Value Date   WBC 4.6 08/28/2022   HGB 9.2 (L) 08/28/2022   HCT 27.8 (L) 08/28/2022   MCV 108.2 (H) 08/28/2022   PLT 272 08/28/2022      Chemistry      Component Value Date/Time   NA 140 11/06/2021 1452   K 3.9 11/06/2021 1452   CL 105 11/06/2021 1452   CO2 31 11/06/2021 1452   BUN 15 11/06/2021 1452   CREATININE 0.69 11/06/2021 1452   CREATININE 0.77 10/23/2020 1338   CREATININE 0.63 10/14/2019 1501      Component Value Date/Time   CALCIUM 9.7 11/06/2021 1452   ALKPHOS 55 11/06/2021 1452   AST 12 11/06/2021 1452   AST 15 10/23/2020 1338   ALT 9 11/06/2021 1452   ALT 13 10/23/2020 1338   BILITOT 0.6 11/06/2021 1452   BILITOT 0.6 10/23/2020 1338       RADIOGRAPHIC STUDIES: No results found.  ASSESSMENT AND PLAN: This is a very pleasant 89 years old white female recently diagnosed with low-grade myelodysplastic syndrome with ring sideroblasts based on a bone marrow biopsy and aspirate as well as her clinical presentation with bicytopenia including anemia and leukocytopenia. The patient is currently on observation and she is feeling fine. Repeat CBC today showed improvement of her total white blood count up to 4.6 with hemoglobin of 9.2 and  hematocrit of 27.8%.  She has normal platelets count of 272,000. I recommended for the patient to continue on observation with repeat blood work in 6 months. She was advised to call immediately if she has any other concerning issues in the interval.  The patient voices understanding of current disease status and treatment options and is in agreement with the current care plan.  All questions were answered. The patient knows to call the clinic with any problems, questions or concerns. We can certainly see the patient much sooner if necessary.  Disclaimer: This note was dictated with voice recognition software. Similar sounding words can inadvertently be transcribed and may not be corrected upon review.

## 2022-11-10 ENCOUNTER — Encounter: Payer: Self-pay | Admitting: Internal Medicine

## 2022-11-10 NOTE — Patient Instructions (Incomplete)
Flu immunization administered today.      Blood work was ordered.   The lab is on the first floor.    Medications changes include :   none     Return in about 1 year (around 11/11/2023) for Physical Exam.   Health Maintenance, Female Adopting a healthy lifestyle and getting preventive care are important in promoting health and wellness. Ask your health care provider about: The right schedule for you to have regular tests and exams. Things you can do on your own to prevent diseases and keep yourself healthy. What should I know about diet, weight, and exercise? Eat a healthy diet  Eat a diet that includes plenty of vegetables, fruits, low-fat dairy products, and lean protein. Do not eat a lot of foods that are high in solid fats, added sugars, or sodium. Maintain a healthy weight Body mass index (BMI) is used to identify weight problems. It estimates body fat based on height and weight. Your health care provider can help determine your BMI and help you achieve or maintain a healthy weight. Get regular exercise Get regular exercise. This is one of the most important things you can do for your health. Most adults should: Exercise for at least 150 minutes each week. The exercise should increase your heart rate and make you sweat (moderate-intensity exercise). Do strengthening exercises at least twice a week. This is in addition to the moderate-intensity exercise. Spend less time sitting. Even light physical activity can be beneficial. Watch cholesterol and blood lipids Have your blood tested for lipids and cholesterol at 87 years of age, then have this test every 5 years. Have your cholesterol levels checked more often if: Your lipid or cholesterol levels are high. You are older than 87 years of age. You are at high risk for heart disease. What should I know about cancer screening? Depending on your health history and family history, you may need to have cancer screening at  various ages. This may include screening for: Breast cancer. Cervical cancer. Colorectal cancer. Skin cancer. Lung cancer. What should I know about heart disease, diabetes, and high blood pressure? Blood pressure and heart disease High blood pressure causes heart disease and increases the risk of stroke. This is more likely to develop in people who have high blood pressure readings or are overweight. Have your blood pressure checked: Every 3-5 years if you are 80-55 years of age. Every year if you are 72 years old or older. Diabetes Have regular diabetes screenings. This checks your fasting blood sugar level. Have the screening done: Once every three years after age 65 if you are at a normal weight and have a low risk for diabetes. More often and at a younger age if you are overweight or have a high risk for diabetes. What should I know about preventing infection? Hepatitis B If you have a higher risk for hepatitis B, you should be screened for this virus. Talk with your health care provider to find out if you are at risk for hepatitis B infection. Hepatitis C Testing is recommended for: Everyone born from [birth year] through 1963. Anyone with known risk factors for hepatitis C. Sexually transmitted infections (STIs) Get screened for STIs, including gonorrhea and chlamydia, if: You are sexually active and are younger than 87 years of age. You are older than 87 years of age and your health care provider tells you that you are at risk for this type of infection. Your sexual activity has changed since you  were last screened, and you are at increased risk for chlamydia or gonorrhea. Ask your health care provider if you are at risk. Ask your health care provider about whether you are at high risk for HIV. Your health care provider may recommend a prescription medicine to help prevent HIV infection. If you choose to take medicine to prevent HIV, you should first get tested for HIV. You should then be  tested every 3 months for as long as you are taking the medicine. Pregnancy If you are about to stop having your period (premenopausal) and you may become pregnant, seek counseling before you get pregnant. Take 400 to 800 micrograms (mcg) of folic acid every day if you become pregnant. Ask for birth control (contraception) if you want to prevent pregnancy. Osteoporosis and menopause Osteoporosis is a disease in which the bones lose minerals and strength with aging. This can result in bone fractures. If you are 31 years old or older, or if you are at risk for osteoporosis and fractures, ask your health care provider if you should: Be screened for bone loss. Take a calcium or vitamin D supplement to lower your risk of fractures. Be given hormone replacement therapy (HRT) to treat symptoms of menopause. Follow these instructions at home: Alcohol use Do not drink alcohol if: Your health care provider tells you not to drink. You are pregnant, may be pregnant, or are planning to become pregnant. If you drink alcohol: Limit how much you have to: 0-1 drink a day. Know how much alcohol is in your drink. In the [place] one drink equals one 12 oz bottle of beer (355 mL), one 5 oz glass of wine (148 mL), or one 1 oz glass of hard liquor (44 mL). Lifestyle Do not use any products that contain nicotine or tobacco. These products include cigarettes, chewing tobacco, and vaping devices, such as e-cigarettes. If you need help quitting, ask your health care provider. Do not use street drugs. Do not share needles. Ask your health care provider for help if you need support or information about quitting drugs. General instructions Schedule regular health, dental, and eye exams. Stay current with your vaccines. Tell your health care provider if: You often feel depressed. You have ever been abused or do not feel safe at home. Summary Adopting a healthy lifestyle and getting preventive care are important in  promoting health and wellness. Follow your health care provider's instructions about healthy diet, exercising, and getting tested or screened for diseases. Follow your health care provider's instructions on monitoring your cholesterol and blood pressure. This information is not intended to replace advice given to you by your health care provider. Make sure you discuss any questions you have with your health care provider. Document Revised: 05/28/2020 Document Reviewed: 05/28/2020 Elsevier Patient Education  2024 [employer].

## 2022-11-10 NOTE — Progress Notes (Unsigned)
Subjective:    Patient ID: Olivia Evans, female    DOB: 1926/02/24, 89 y.o.   MRN: [id number]      HPI Olivia Evans is here for a Physical exam and her chronic medical problems.   ? labs  Taking gabapnetin, torsemide  Medications and allergies reviewed with patient and updated if appropriate.  Current Outpatient Medications on File Prior to Visit  Medication Sig Dispense Refill   acetaminophen (TYLENOL) 325 MG tablet Take 325 mg by mouth every 8 (eight) hours as needed for moderate pain.     Apoaequorin (PREVAGEN PO) Take by mouth.     Cholecalciferol (VITAMIN D3) 1000 UNITS CAPS Take 4,000 Units by mouth daily.     Ferrous Sulfate (IRON PO) Take 1 tablet by mouth 2 (two) times daily. Patient takes Mega-Food Blood Builder Iron Minis     gabapentin (NEURONTIN) 100 MG capsule Take 2 capsules (200 mg total) by mouth 3 (three) times daily. 540 capsule 0   Multiple Vitamins-Minerals (PRESERVISION AREDS 2 PO) Take 1 capsule by mouth in the morning and at bedtime.     OVER THE COUNTER MEDICATION Take 1 tablet by mouth at bedtime as needed (laxitive). PruneLax     Polyethyl Glycol-Propyl Glycol (SYSTANE OP) Place 1 drop into both eyes daily. To both eyes.     torsemide (DEMADEX) 20 MG tablet TAKE 1 TABLET BY MOUTH EVERY DAY 90 tablet 1   No current facility-administered medications on file prior to visit.    Review of Systems     Objective:  There were no vitals filed for this visit. There were no vitals filed for this visit. There is no height or weight on file to calculate BMI.  BP Readings from Last 3 Encounters:  08/28/22 (!) 126/58  03/06/22 118/64  02/27/22 (!) 126/52    Wt Readings from Last 3 Encounters:  08/28/22 130 lb (59 kg)  03/06/22 127 lb 3.2 oz (57.7 kg)  02/27/22 129 lb (58.5 kg)       Physical Exam Constitutional: She appears well-developed and well-nourished. No distress.  HENT:  Head: Normocephalic and atraumatic.  Right Ear: External ear normal.  Normal ear canal and TM Left Ear: External ear normal.  Normal ear canal and TM Mouth/Throat: Oropharynx is clear and moist.  Eyes: Conjunctivae normal.  Neck: Neck supple. No tracheal deviation present. No thyromegaly present.  No carotid bruit  Cardiovascular: Normal rate, regular rhythm and normal heart sounds.   No murmur heard.  No edema. Pulmonary/Chest: Effort normal and breath sounds normal. No respiratory distress. She has no wheezes. She has no rales.  Breast: deferred   Abdominal: Soft. She exhibits no distension. There is no tenderness.  Lymphadenopathy: She has no cervical adenopathy.  Skin: Skin is warm and dry. She is not diaphoretic.  Psychiatric: She has a normal mood and affect. Her behavior is normal.     Lab Results  Component Value Date   WBC 4.6 08/28/2022   HGB 9.2 (L) 08/28/2022   HCT 27.8 (L) 08/28/2022   PLT 272 08/28/2022   GLUCOSE 95 08/28/2022   CHOL 147 11/06/2021   TRIG 69.0 11/06/2021   HDL 67.50 11/06/2021   LDLDIRECT 104.5 04/01/2011   LDLCALC 66 11/06/2021   ALT 11 08/28/2022   AST 14 (L) 08/28/2022   NA 140 08/28/2022   K 4.1 08/28/2022   CL 103 08/28/2022   CREATININE 0.75 08/28/2022   BUN 15 08/28/2022   CO2 32 08/28/2022  TSH 0.83 11/06/2021   HGBA1C 5.8 11/06/2021         Assessment & Plan:   Physical exam: Screening blood work  ordered Exercise   Weight   Substance abuse  none   Reviewed recommended immunizations.   Health Maintenance  Topic Date Due   Zoster Vaccines- Shingrix (1 of 2) 02/26/1945   COVID-19 Vaccine (3 - Moderna risk series) 10/17/2019   INFLUENZA VACCINE  08/21/2022   Medicare Annual Wellness (AWV)  06/24/2023   DEXA SCAN  11/07/2023   Pneumonia Vaccine 18+ Years old  Completed   HPV VACCINES  Aged Out   DTaP/Tdap/Td  Discontinued          See Problem List for Assessment and Plan of chronic medical problems.

## 2022-11-11 ENCOUNTER — Ambulatory Visit (INDEPENDENT_AMBULATORY_CARE_PROVIDER_SITE_OTHER): Payer: PPO | Admitting: Internal Medicine

## 2022-11-11 VITALS — BP 126/62 | HR 68 | Temp 98.3°F | Ht 64.0 in | Wt 129.0 lb

## 2022-11-11 DIAGNOSIS — R600 Localized edema: Secondary | ICD-10-CM | POA: Diagnosis not present

## 2022-11-11 DIAGNOSIS — E559 Vitamin D deficiency, unspecified: Secondary | ICD-10-CM

## 2022-11-11 DIAGNOSIS — Z0000 Encounter for general adult medical examination without abnormal findings: Secondary | ICD-10-CM

## 2022-11-11 DIAGNOSIS — R7303 Prediabetes: Secondary | ICD-10-CM

## 2022-11-11 DIAGNOSIS — D469 Myelodysplastic syndrome, unspecified: Secondary | ICD-10-CM | POA: Diagnosis not present

## 2022-11-11 DIAGNOSIS — Z23 Encounter for immunization: Secondary | ICD-10-CM | POA: Diagnosis not present

## 2022-11-11 DIAGNOSIS — M5481 Occipital neuralgia: Secondary | ICD-10-CM

## 2022-11-11 DIAGNOSIS — M810 Age-related osteoporosis without current pathological fracture: Secondary | ICD-10-CM

## 2022-11-11 LAB — HEMOGLOBIN A1C: Hgb A1c MFr Bld: 5.8 % (ref 4.6–6.5)

## 2022-11-11 LAB — COMPREHENSIVE METABOLIC PANEL
ALT: 11 U/L (ref 0–35)
AST: 13 U/L (ref 0–37)
Albumin: 4.3 g/dL (ref 3.5–5.2)
Alkaline Phosphatase: 58 U/L (ref 39–117)
BUN: 17 mg/dL (ref 6–23)
CO2: 31 meq/L (ref 19–32)
Calcium: 10.1 mg/dL (ref 8.4–10.5)
Chloride: 104 meq/L (ref 96–112)
Creatinine, Ser: 0.65 mg/dL (ref 0.40–1.20)
GFR: 75.21 mL/min (ref >60.00)
Glucose, Bld: 103 mg/dL — ABNORMAL HIGH (ref 70–99)
Potassium: 4.3 meq/L (ref 3.5–5.1)
Sodium: 141 meq/L (ref 135–145)
Total Bilirubin: 0.6 mg/dL (ref 0.2–1.2)
Total Protein: 6.4 g/dL (ref 6.0–8.3)

## 2022-11-11 LAB — CBC WITH DIFFERENTIAL/PLATELET
Basophils Absolute: 0.1 10*3/uL (ref 0.0–0.1)
Basophils Relative: 2.3 % (ref 0.0–3.0)
Eosinophils Absolute: 0.2 10*3/uL (ref 0.0–0.7)
Eosinophils Relative: 4.9 % (ref 0.0–5.0)
HCT: 27.2 % — ABNORMAL LOW (ref 36.0–46.0)
Hemoglobin: 9 g/dL — ABNORMAL LOW (ref 12.0–15.0)
Lymphocytes Relative: 39.8 % (ref 12.0–46.0)
Lymphs Abs: 1.5 10*3/uL (ref 0.7–4.0)
MCHC: 33.1 g/dL (ref 30.0–36.0)
MCV: 109.5 fL — ABNORMAL HIGH (ref 78.0–100.0)
Monocytes Absolute: 0.2 10*3/uL (ref 0.1–1.0)
Monocytes Relative: 5.3 % (ref 3.0–12.0)
Neutro Abs: 1.8 10*3/uL (ref 1.4–7.7)
Neutrophils Relative %: 47.7 % (ref 43.0–77.0)
Platelets: 269 10*3/uL (ref 150.0–400.0)
RBC: 2.48 Mil/uL — ABNORMAL LOW (ref 3.87–5.11)
RDW: 15.6 % — ABNORMAL HIGH (ref 11.5–15.5)
WBC: 3.9 10*3/uL — ABNORMAL LOW (ref 4.0–10.5)

## 2022-11-11 LAB — TSH: TSH: 1.08 u[IU]/mL (ref 0.35–5.50)

## 2022-11-11 LAB — VITAMIN D 25 HYDROXY (VIT D DEFICIENCY, FRACTURES): VITD: 50.81 ng/mL (ref 30.00–100.00)

## 2022-11-11 NOTE — Assessment & Plan Note (Signed)
Chronic Monitored by oncology

## 2022-11-11 NOTE — Assessment & Plan Note (Addendum)
Chronic DEXA up-to-date-done last year Severe osteoporosis Unable to take calcium secondary to severe constipation Continue vitamin D Discussed treatment options-she will think about if she wants to take anything and let me know, but at this point she does not want to take anything

## 2022-11-11 NOTE — Addendum Note (Signed)
Addended by: Karma Ganja on: 11/11/2022 04:46 PM   Modules accepted: Orders

## 2022-11-11 NOTE — Assessment & Plan Note (Signed)
Chronic Lab Results  Component Value Date   HGBA1C 5.8 11/06/2021   Low sugar / carb diet Stressed regular exercise

## 2022-11-11 NOTE — Assessment & Plan Note (Signed)
Chronic No longer having symptoms-she states the gabapentin helped and she is no longer taking it Advised if symptoms recur that she can restart the gabapentin

## 2022-11-11 NOTE — Assessment & Plan Note (Signed)
Chronic Taking vitamin D daily 

## 2022-11-11 NOTE — Assessment & Plan Note (Addendum)
Chronic Controlled Reviewed recent CMP-GFR normal, potassium normal Continue torsemide 20 mg daily prn Elevates legs Wears compression socks

## 2022-11-21 ENCOUNTER — Other Ambulatory Visit: Payer: Self-pay | Admitting: Internal Medicine

## 2022-11-21 DIAGNOSIS — R600 Localized edema: Secondary | ICD-10-CM

## 2023-02-04 DIAGNOSIS — H35363 Drusen (degenerative) of macula, bilateral: Secondary | ICD-10-CM | POA: Diagnosis not present

## 2023-03-03 ENCOUNTER — Inpatient Hospital Stay: Payer: PPO | Attending: Internal Medicine

## 2023-03-03 ENCOUNTER — Inpatient Hospital Stay: Payer: PPO | Admitting: Internal Medicine

## 2023-03-03 DIAGNOSIS — D461 Refractory anemia with ring sideroblasts: Secondary | ICD-10-CM | POA: Insufficient documentation

## 2023-03-04 ENCOUNTER — Inpatient Hospital Stay: Payer: PPO | Admitting: Internal Medicine

## 2023-03-04 ENCOUNTER — Inpatient Hospital Stay: Payer: PPO

## 2023-03-04 VITALS — BP 148/58 | HR 74 | Temp 98.6°F | Resp 18 | Ht 64.0 in | Wt 128.9 lb

## 2023-03-04 DIAGNOSIS — D461 Refractory anemia with ring sideroblasts: Secondary | ICD-10-CM | POA: Diagnosis not present

## 2023-03-04 DIAGNOSIS — D469 Myelodysplastic syndrome, unspecified: Secondary | ICD-10-CM

## 2023-03-04 LAB — CMP (CANCER CENTER ONLY)
ALT: 13 U/L (ref 0–44)
AST: 15 U/L (ref 15–41)
Albumin: 4.2 g/dL (ref 3.5–5.0)
Alkaline Phosphatase: 72 U/L (ref 38–126)
Anion gap: 5 (ref 5–15)
BUN: 17 mg/dL (ref 8–23)
CO2: 34 mmol/L — ABNORMAL HIGH (ref 22–32)
Calcium: 9.9 mg/dL (ref 8.9–10.3)
Chloride: 101 mmol/L (ref 98–111)
Creatinine: 0.76 mg/dL (ref 0.44–1.00)
GFR, Estimated: >60 mL/min (ref >60)
Glucose, Bld: 108 mg/dL — ABNORMAL HIGH (ref 70–99)
Potassium: 3.7 mmol/L (ref 3.5–5.1)
Sodium: 140 mmol/L (ref 135–145)
Total Bilirubin: 0.6 mg/dL (ref 0.0–1.2)
Total Protein: 6.5 g/dL (ref 6.5–8.1)

## 2023-03-04 LAB — CBC WITH DIFFERENTIAL (CANCER CENTER ONLY)
Abs Immature Granulocytes: 0.02 10*3/uL (ref 0.00–0.07)
Basophils Absolute: 0.1 10*3/uL (ref 0.0–0.1)
Basophils Relative: 1 %
Eosinophils Absolute: 0.3 10*3/uL (ref 0.0–0.5)
Eosinophils Relative: 5 %
HCT: 27.6 % — ABNORMAL LOW (ref 36.0–46.0)
Hemoglobin: 9.1 g/dL — ABNORMAL LOW (ref 12.0–15.0)
Immature Granulocytes: 0 %
Lymphocytes Relative: 28 %
Lymphs Abs: 1.4 10*3/uL (ref 0.7–4.0)
MCH: 36.8 pg — ABNORMAL HIGH (ref 26.0–34.0)
MCHC: 33 g/dL (ref 30.0–36.0)
MCV: 111.7 fL — ABNORMAL HIGH (ref 80.0–100.0)
Monocytes Absolute: 0.3 10*3/uL (ref 0.1–1.0)
Monocytes Relative: 5 %
Neutro Abs: 3 10*3/uL (ref 1.7–7.7)
Neutrophils Relative %: 61 %
Platelet Count: 287 10*3/uL (ref 150–400)
RBC: 2.47 MIL/uL — ABNORMAL LOW (ref 3.87–5.11)
RDW: 14.6 % (ref 11.5–15.5)
WBC Count: 5 10*3/uL (ref 4.0–10.5)
nRBC: 0 % (ref 0.0–0.2)

## 2023-03-04 LAB — IRON AND IRON BINDING CAPACITY (CC-WL,HP ONLY)
Iron: 76 ug/dL (ref 28–170)
Saturation Ratios: 30 % (ref 10.4–31.8)
TIBC: 256 ug/dL (ref 250–450)
UIBC: 180 ug/dL (ref 148–442)

## 2023-03-04 LAB — FERRITIN: Ferritin: 209 ng/mL (ref 11–307)

## 2023-03-04 LAB — LACTATE DEHYDROGENASE: LDH: 157 U/L (ref 98–192)

## 2023-03-04 NOTE — Progress Notes (Signed)
[facility] [phone]) [phone]   [phone]  OFFICE PROGRESS NOTE  Pincus Sanes, MD [address] [place] [address]  DIAGNOSIS: Low-grade myelodysplastic syndrome with ringed sideroblasts.  PRIOR THERAPY: None  CURRENT THERAPY: Observation  INTERVAL HISTORY: Olivia Evans 89 y.o. female returns to the clinic today for 67-month follow-up visit accompanied by her daughter.Discussed the use of AI scribe software for clinical note transcription with the patient, who gave verbal consent to proceed.  History of Present Illness   Olivia Evans is a 89 year old female with myelodysplastic syndrome who presents for routine follow-up.  She has a known diagnosis of myelodysplastic syndrome with ringed sideroblasts. Her hemoglobin level remains stable at 9.1, consistent with her last visit six months ago. White blood cell count and platelets are within normal limits. No significant changes in her condition have been noted since her last visit.  She experiences weakness in her legs, describing them as 'getting weaker by the minute.' Despite this, she can perform basic activities such as getting up from her chair to go to the bathroom or kitchen. Her mobility is limited, as she has not been out of her recliner much since attending church.  No headaches, dizziness, or significant fatigue. She notes some shortness of breath, particularly when singing, as she cannot sing a whole line without stopping to breathe. No other respiratory symptoms are reported.          MEDICAL HISTORY: Past Medical History:  Diagnosis Date   Arthritis KNEES   Bladder pain    Chronic constipation    Diverticulosis 2013   Dr Russella Dar   Dyslipidemia    Heart murmur    Hemorrhoids    History of adenomatous polyp of colon    2003 -- VILLOUS   History of breast cancer 2001---RIGHT BREAST CANCER   S/P PARTIAL MASTECTOMY AND RADIATION--  NO RECURRENCE   History of  palpitations    Incomplete right bundle branch block (RBBB)    Interstitial cystitis    Dr McDiarmid   Lower urinary tract symptoms (LUTS)    Normal cardiac stress test    2000  PER MD NOTE   Osteopenia    Wears dentures    full -upper/  partial lower   Wears glasses     ALLERGIES:  is allergic to premarin [conjugated estrogens], vicodin [hydrocodone-acetaminophen], adhesive [tape], fluvastatin sodium, and simvastatin.  MEDICATIONS:  Current Outpatient Medications  Medication Sig Dispense Refill   acetaminophen (TYLENOL) 325 MG tablet Take 325 mg by mouth every 8 (eight) hours as needed for moderate pain.     Apoaequorin (PREVAGEN PO) Take by mouth.     B Complex Vitamins (B COMPLEX-B12 PO) Take by mouth.     Cholecalciferol (VITAMIN D3) 1000 UNITS CAPS Take 4,000 Units by mouth daily.     Ferrous Sulfate (IRON PO) Take 1 tablet by mouth 2 (two) times daily. Patient takes Mega-Food Blood Builder Iron Minis     Mag Aspart-Potassium Aspart 90-90 MG CAPS Take by mouth.     Multiple Vitamins-Minerals (PRESERVISION AREDS 2 PO) Take 1 capsule by mouth in the morning and at bedtime.     OVER THE COUNTER MEDICATION Take 1 tablet by mouth at bedtime as needed (laxitive). PruneLax     Polyethyl Glycol-Propyl Glycol (SYSTANE OP) Place 1 drop into both eyes daily. To both eyes.     torsemide (DEMADEX) 20 MG tablet TAKE 1 TABLET BY MOUTH  EVERY DAY 90 tablet 1   No current facility-administered medications for this visit.    SURGICAL HISTORY:  Past Surgical History:  Procedure Laterality Date   BENIGN TUMOR REMOVED  1960'S   SECOND RIB   CATARACT EXTRACTION W/ INTRAOCULAR LENS  IMPLANT, BILATERAL  feb 2016   CYSTO WITH HYDRODISTENSION  07/29/2011   Procedure: CYSTOSCOPY/HYDRODISTENSION;  Surgeon: Martina Sinner, MD;  Location: [facility];  Service: Urology;  Laterality: N/A;  with fulguration of bladder ulcer   CYSTO WITH HYDRODISTENSION N/A 08/28/2014   Procedure:  CYSTO/HYDRODISTENSION, FULGURATION OF ULCERS;  Surgeon: Alfredo Martinez, MD;  Location: [facility] [place];  Service: Urology;  Laterality: N/A;   CYSTO/  HYDRODISTENTION/  INSTILLATION THERAPY  x2  01-18- and 11-15- 2011   CYSTO/ HOD/  BLADDER BX'S/  FULGERATION HUNNER ULCER'S /  INSTILLATION THERAPY  10-01-2010   PARTIAL MASTECTOMY W/ SLN DISSECTION Right 12-19-1999   ROTATOR CUFF REPAIR     TUBAL LIGATION     VAGINAL HYSTERECTOMY  1970'S   WITH APPENDECTOMY   VARICOSE VEIN SURGERY      REVIEW OF SYSTEMS:  A comprehensive review of systems was negative except for: Constitutional: positive for fatigue   PHYSICAL EXAMINATION: General appearance: alert, cooperative, fatigued, and no distress Head: Normocephalic, without obvious abnormality, atraumatic Neck: no adenopathy, no JVD, supple, symmetrical, trachea midline, and thyroid not enlarged, symmetric, no tenderness/mass/nodules Lymph nodes: Cervical, supraclavicular, and axillary nodes normal. Resp: clear to auscultation bilaterally Back: symmetric, no curvature. ROM normal. No CVA tenderness. Cardio: regular rate and rhythm, S1, S2 normal, no murmur, click, rub or gallop GI: soft, non-tender; bowel sounds normal; no masses,  no organomegaly Extremities: extremities normal, atraumatic, no cyanosis or edema  ECOG PERFORMANCE STATUS: 1 - Symptomatic but completely ambulatory  Blood pressure (!) 148/58, pulse 74, temperature 98.6 F (37 C), temperature source Tympanic, resp. rate 18, height 5\' 4"  (1.626 m), weight 128 lb 14.4 oz (58.5 kg), SpO2 96%.  LABORATORY DATA: Lab Results  Component Value Date   WBC 5.0 03/04/2023   HGB 9.1 (L) 03/04/2023   HCT 27.6 (L) 03/04/2023   MCV 111.7 (H) 03/04/2023   PLT 287 03/04/2023      Chemistry      Component Value Date/Time   NA 140 03/04/2023 1342   K 3.7 03/04/2023 1342   CL 101 03/04/2023 1342   CO2 34 (H) 03/04/2023 1342   BUN 17 03/04/2023 1342   CREATININE 0.76  03/04/2023 1342   CREATININE 0.63 10/14/2019 1501      Component Value Date/Time   CALCIUM 9.9 03/04/2023 1342   ALKPHOS 72 03/04/2023 1342   AST 15 03/04/2023 1342   ALT 13 03/04/2023 1342   BILITOT 0.6 03/04/2023 1342       RADIOGRAPHIC STUDIES: No results found.  ASSESSMENT AND PLAN: This is a very pleasant 89 years old white female recently diagnosed with low-grade myelodysplastic syndrome with ring sideroblasts based on a bone marrow biopsy and aspirate as well as her clinical presentation with bicytopenia including anemia and leukocytopenia. The patient is currently on observation and she is feeling fine with no concerning complaints except for the mild fatigue.    Myelodysplastic Syndrome with Ring Sideroblasts Chronic condition characterized by ineffective hematopoiesis leading to blood cell deficiencies. Hemoglobin is well-managed at 9.1 (previously 9.0). White blood cell count and platelets are within normal limits. Discussed the importance of regular monitoring and potential need for transfusions if hemoglobin drops significantly. - Monitor  blood counts regularly - Order lab work in six months to check iron levels and complete blood count - Schedule follow-up appointment in six months.   The patient was advised to call immediately if she has any concerning symptoms in the interval.  The patient voices understanding of current disease status and treatment options and is in agreement with the current care plan.  All questions were answered. The patient knows to call the clinic with any problems, questions or concerns. We can certainly see the patient much sooner if necessary.  Disclaimer: This note was dictated with voice recognition software. Similar sounding words can inadvertently be transcribed and may not be corrected upon review.

## 2023-03-05 DIAGNOSIS — L821 Other seborrheic keratosis: Secondary | ICD-10-CM | POA: Diagnosis not present

## 2023-03-05 DIAGNOSIS — L905 Scar conditions and fibrosis of skin: Secondary | ICD-10-CM | POA: Diagnosis not present

## 2023-03-05 DIAGNOSIS — L820 Inflamed seborrheic keratosis: Secondary | ICD-10-CM | POA: Diagnosis not present

## 2023-03-05 DIAGNOSIS — D229 Melanocytic nevi, unspecified: Secondary | ICD-10-CM | POA: Diagnosis not present

## 2023-03-05 DIAGNOSIS — L538 Other specified erythematous conditions: Secondary | ICD-10-CM | POA: Diagnosis not present

## 2023-05-06 DIAGNOSIS — H16232 Neurotrophic keratoconjunctivitis, left eye: Secondary | ICD-10-CM | POA: Diagnosis not present

## 2023-05-11 ENCOUNTER — Ambulatory Visit (INDEPENDENT_AMBULATORY_CARE_PROVIDER_SITE_OTHER)

## 2023-05-11 VITALS — Ht 64.0 in | Wt 128.0 lb

## 2023-05-11 DIAGNOSIS — Z0000 Encounter for general adult medical examination without abnormal findings: Secondary | ICD-10-CM

## 2023-05-11 NOTE — Patient Instructions (Signed)
 Olivia Evans , Thank you for taking time to come for your Medicare Wellness Visit. I appreciate your ongoing commitment to your health goals. Please review the following plan we discussed and let me know if I can assist you in the future.   Referrals/Orders/Follow-Ups/Clinician Recommendations: It was nice talking with you today.  You are due for a Shingles vaccine and a Tetanus.  Keep up the good work.  This is a list of the screening recommended for you and due dates:  Health Maintenance  Topic Date Due   Zoster (Shingles) Vaccine (1 of 2) 02/26/1945   COVID-19 Vaccine (3 - Moderna risk series) 10/17/2019   Flu Shot  08/21/2023   DEXA scan (bone density measurement)  11/07/2023   Medicare Annual Wellness Visit  05/10/2024   Pneumonia Vaccine  Completed   HPV Vaccine  Aged Out   Meningitis B Vaccine  Aged Out   DTaP/Tdap/Td vaccine  Discontinued    Advanced directives: (Copy Requested) Please bring a copy of your health care power of attorney and living will to the office to be added to your chart at your convenience. You can mail to [facility] [address] [place], [place] [address] or email to [email or web address]  Next Medicare Annual Wellness Visit scheduled for next year: Yes

## 2023-05-11 NOTE — Progress Notes (Signed)
 Subjective:   Olivia Evans is a 89 y.o. who presents for a Medicare Wellness preventive visit.  Visit Complete: Virtual I connected with  Olivia Evans on 05/11/23 by a audio enabled telemedicine application and verified that I am speaking with the correct person using two identifiers.  Patient Location: Home  Provider Location: Home Office  I discussed the limitations of evaluation and management by telemedicine. The patient expressed understanding and agreed to proceed.  Vital Signs: Because this visit was a virtual/telehealth visit, some criteria may be missing or patient reported. Any vitals not documented were not able to be obtained and vitals that have been documented are patient reported.  VideoDeclined- This patient declined Librarian, academic. Therefore the visit was completed with audio only.  Persons Participating in Visit: Patient.  AWV Questionnaire: No: Patient Medicare AWV questionnaire was not completed prior to this visit.  Cardiac Risk Factors include: advanced age (>21men, >7 women);Other (see comment), Risk factor comments: SOB (shortness of breath) on exertion     Objective:    Today's Vitals   05/11/23 1040  Weight: 128 lb (58.1 kg)  Height: 5\' 4"  (1.626 m)   Body mass index is 21.97 kg/m.     05/11/2023   10:57 AM 10/21/2021    4:36 PM 04/25/2021   10:50 AM 11/23/2020    8:23 AM 10/23/2020    2:06 PM 04/12/2020   10:36 AM 11/01/2014   12:29 PM  Advanced Directives  Does Patient Have a Medical Advance Directive? Yes No Yes Yes Yes Yes Yes  Type of Estate agent of [place];Living will  Living will;Healthcare Power of [employer] Power of [place];Living will Healthcare Power of [place];Living will Healthcare Power of [place];Living will   Does patient want to make changes to medical advance directive?   No - Patient declined No - Patient declined No - Patient declined No - Patient declined    Copy of Healthcare Power of Attorney in Chart? No - copy requested  No - copy requested No - copy requested No - copy requested No - copy requested No - copy requested  Would patient like information on creating a medical advance directive?  No - Patient declined         Current Medications (verified) Outpatient Encounter Medications as of 05/11/2023  Medication Sig   acetaminophen  (TYLENOL ) 325 MG tablet Take 325 mg by mouth every 8 (eight) hours as needed for moderate pain.   Apoaequorin (PREVAGEN PO) Take by mouth.   B Complex Vitamins (B COMPLEX-B12 PO) Take by mouth.   Cholecalciferol (VITAMIN D3) 1000 UNITS CAPS Take 4,000 Units by mouth daily.   Ferrous Sulfate (IRON PO) Take 1 tablet by mouth 2 (two) times daily. Patient takes Mega-Food Blood Builder Iron Minis   Mag Aspart-Potassium Aspart 90-90 MG CAPS Take by mouth.   Multiple Vitamins-Minerals (PRESERVISION AREDS 2 PO) Take 1 capsule by mouth in the morning and at bedtime.   OVER THE COUNTER MEDICATION Take 1 tablet by mouth at bedtime as needed (laxitive). PruneLax   Polyethyl Glycol-Propyl Glycol (SYSTANE OP) Place 1 drop into both eyes daily. To both eyes.   torsemide  (DEMADEX ) 20 MG tablet TAKE 1 TABLET BY MOUTH EVERY DAY   No facility-administered encounter medications on file as of 05/11/2023.    Allergies (verified) Premarin [conjugated estrogens], Vicodin [hydrocodone -acetaminophen ], Adhesive [tape], Fluvastatin sodium, and Simvastatin   History: Past Medical History:  Diagnosis Date   Arthritis KNEES   Bladder  pain    Chronic constipation    Diverticulosis 2013   Dr Sandrea Cruel   Dyslipidemia    Heart murmur    Hemorrhoids    History of adenomatous polyp of colon    2003 -- VILLOUS   History of breast cancer 2001---RIGHT BREAST CANCER   S/P PARTIAL MASTECTOMY AND RADIATION--  NO RECURRENCE   History of palpitations    Incomplete right bundle branch block (RBBB)    Interstitial cystitis    Dr McDiarmid    Lower urinary tract symptoms (LUTS)    Normal cardiac stress test    2000  PER MD NOTE   Osteopenia    Wears dentures    full -upper/  partial lower   Wears glasses    Past Surgical History:  Procedure Laterality Date   BENIGN TUMOR REMOVED  1960'S   SECOND RIB   CATARACT EXTRACTION W/ INTRAOCULAR LENS  IMPLANT, BILATERAL  feb 2016   CYSTO WITH HYDRODISTENSION  07/29/2011   Procedure: CYSTOSCOPY/HYDRODISTENSION;  Surgeon: Devorah Fonder, MD;  Location: [facility];  Service: Urology;  Laterality: N/A;  with fulguration of bladder ulcer   CYSTO WITH HYDRODISTENSION N/A 08/28/2014   Procedure: CYSTO/HYDRODISTENSION, FULGURATION OF ULCERS;  Surgeon: Erman Hayward, MD;  Location: [facility];  Service: Urology;  Laterality: N/A;   CYSTO/  HYDRODISTENTION/  INSTILLATION THERAPY  x2  01-18- and 11-15- 2011   CYSTO/ HOD/  BLADDER BX'S/  FULGERATION HUNNER ULCER'S /  INSTILLATION THERAPY  10-01-2010   PARTIAL MASTECTOMY W/ SLN DISSECTION Right 12-19-1999   ROTATOR CUFF REPAIR     TUBAL LIGATION     VAGINAL HYSTERECTOMY  1970'S   WITH APPENDECTOMY   VARICOSE VEIN SURGERY     Family History  Problem Relation Age of Onset   Heart disease Mother         CHF; no MI   Dementia Sister    Breast cancer Sister        S/P bilateral mastectomy   Stroke Brother 74   Diabetes Maternal Grandmother    Thyroid  disease Other        3 daughters   Colon cancer Neg Hx    Esophageal cancer Neg Hx    Stomach cancer Neg Hx    Rectal cancer Neg Hx    Social History   Socioeconomic History   Marital status: Widowed    Spouse name: Not on file   Number of children: 4   Years of education: Not on file   Highest education level: Not on file  Occupational History   Occupation: Retired  Tobacco Use   Smoking status: Never   Smokeless tobacco: Never  Vaping Use   Vaping status: Never Used  Substance and Sexual Activity   Alcohol use: No    Alcohol/week: 0.0  standard drinks of alcohol   Drug use: No   Sexual activity: Not on file  Other Topics Concern   Not on file  Social History Narrative   Are you right handed or left handed? RIght   Are you currently employed ? NO   What is your current occupation? Retired   Do you live at home alone? yes   Who lives with you?    What type of home do you live in: 1 story or 2 story? 1       Social Drivers of Corporate investment banker Strain: Low Risk  (05/11/2023)   Overall Financial Resource Strain (CARDIA)  Difficulty of Paying Living Expenses: Not hard at all  Food Insecurity: No Food Insecurity (05/11/2023)   Hunger Vital Sign    Worried About Running Out of Food in the Last Year: Never true    Ran Out of Food in the Last Year: Never true  Transportation Needs: No Transportation Needs (05/11/2023)   PRAPARE - Administrator, Civil Service (Medical): No    Lack of Transportation (Non-Medical): No  Physical Activity: Inactive (05/11/2023)   Exercise Vital Sign    Days of Exercise per Week: 0 days    Minutes of Exercise per Session: 0 min  Stress: No Stress Concern Present (05/11/2023)   [employer] of Occupational Health - Occupational Stress Questionnaire    Feeling of Stress : Not at all  Social Connections: Moderately Integrated (05/11/2023)   Social Connection and Isolation Panel [NHANES]    Frequency of Communication with Friends and Family: More than three times a week    Frequency of Social Gatherings with Friends and Family: Once a week    Attends Religious Services: More than 4 times per year    Active Member of [employer] or Organizations: Yes    Attends Banker Meetings: Never    Marital Status: Widowed    Tobacco Counseling Counseling given: Not Answered    Clinical Intake:  Pre-visit preparation completed: Yes  Pain : No/denies pain     BMI - recorded: 21.97 Nutritional Status: BMI of 19-24  Normal Nutritional Risks: None Diabetes:  No  Lab Results  Component Value Date   HGBA1C 5.8 11/11/2022   HGBA1C 5.8 11/06/2021   HGBA1C 5.8 10/16/2020     How often do you need to have someone help you when you read instructions, pamphlets, or other written materials from your doctor or pharmacy?: 2 - Rarely  Interpreter Needed?: No  Information entered by :: Kimaria Struthers, RMA   Activities of Daily Living     05/11/2023   10:40 AM  In your present state of health, do you have any difficulty performing the following activities:  Hearing? 1  Comment Decreased hearing of both ears  Vision? 0  Difficulty concentrating or making decisions? 0  Walking or climbing stairs? 0  Dressing or bathing? 0  Doing errands, shopping? 0  Comment short distances  Preparing Food and eating ? N  Using the Toilet? N  In the past six months, have you accidently leaked urine? N  Do you have problems with loss of bowel control? N  Managing your Medications? N  Managing your Finances? N  Housekeeping or managing your Housekeeping? N    Patient Care Team: Colene Dauphin, MD as PCP - General (Internal Medicine) Ronni Colace, MD as Consulting Physician (Ophthalmology) [place], Heather, OD as Consulting Physician (Optometry) Jonette Nestle, [facility] as Consulting Physician (Obstetrics and Gynecology)  Indicate any recent Medical Services you may have received from other than [facility] providers in the past year (date may be approximate).     Assessment:   This is a routine wellness examination for Saima.  Hearing/Vision screen Hearing Screening - Comments:: Decreased hearing of both ears Vision Screening - Comments:: Wears eyeglasses   Goals Addressed             This Visit's Progress    Patient Stated   On track    To stay independent and active.       Depression Screen     05/11/2023   11:00 AM 06/24/2022  12:14 PM 03/06/2022    4:16 PM 10/04/2021    4:13 PM 04/25/2021   10:52 AM 04/12/2020   10:46 AM 10/14/2019     2:37 PM  PHQ 2/9 Scores  PHQ - 2 Score 0 0 0 0 0 0 0  PHQ- 9 Score 0          Fall Risk     05/11/2023   10:57 AM 06/24/2022   12:08 PM 03/06/2022    4:16 PM 02/27/2022    4:26 PM 10/04/2021    4:52 PM  Fall Risk   Falls in the past year? 0 0 0 0 0  Number falls in past yr: 0 0 0 0 0  Injury with Fall? 0 0 0 0 0  Risk for fall due to : No Fall Risks  No Fall Risks No Fall Risks No Fall Risks  Follow up Falls prevention discussed;Falls evaluation completed  Falls evaluation completed Falls evaluation completed Falls evaluation completed    MEDICARE RISK AT HOME:  Medicare Risk at Home Any stairs in or around the home?: Yes (1st living) If so, are there any without handrails?: Yes Home free of loose throw rugs in walkways, pet beds, electrical cords, etc?: Yes Adequate lighting in your home to reduce risk of falls?: Yes Use of a cane, walker or w/c?: Yes (sometimes uses a cane) Grab bars in the bathroom?: Yes Shower chair or bench in shower?: No Elevated toilet seat or a handicapped toilet?: No  TIMED UP AND GO:  Was the test performed?  No  Cognitive Function: 6CIT completed    11/01/2014   12:41 PM  MMSE - Mini Mental State Exam  Not completed: --        05/11/2023   11:02 AM 04/25/2021   11:04 AM  6CIT Screen  What Year? 0 points 0 points  What month? 0 points 0 points  What time? 0 points 0 points  Count back from 20 0 points 0 points  Months in reverse 0 points 0 points  Repeat phrase 0 points 0 points  Total Score 0 points 0 points    Immunizations Immunization History  Administered Date(s) Administered   Fluad Quad(high Dose 65+) 10/13/2018, 10/14/2019, 10/16/2020, 10/04/2021   Fluad Trivalent(High Dose 65+) 11/11/2022   Influenza Split 01/27/2011, 12/10/2011   Influenza Whole 10/29/2007, 10/24/2008, 10/09/2009   Influenza, High Dose Seasonal PF 11/04/2012, 11/17/2014, 10/15/2016   Influenza,inj,Quad PF,6+ Mos 11/15/2013   Influenza-Unspecified  11/22/2015   Moderna Sars-Covid-2 Vaccination 08/22/2019, 09/19/2019   PNEUMOCOCCAL CONJUGATE-20 11/22/2021   Pneumococcal Conjugate-13 09/14/2017   Pneumococcal Polysaccharide-23 04/01/2011   Tetanus 07/21/2007   Zoster, Live 02/03/2013    Screening Tests Health Maintenance  Topic Date Due   Zoster Vaccines- Shingrix (1 of 2) 02/26/1945   COVID-19 Vaccine (3 - Moderna risk series) 10/17/2019   Medicare Annual Wellness (AWV)  06/24/2023   INFLUENZA VACCINE  08/21/2023   DEXA SCAN  11/07/2023   Pneumonia Vaccine 3+ Years old  Completed   HPV VACCINES  Aged Out   Meningococcal B Vaccine  Aged Out   DTaP/Tdap/Td  Discontinued    Health Maintenance  Health Maintenance Due  Topic Date Due   Zoster Vaccines- Shingrix (1 of 2) 02/26/1945   COVID-19 Vaccine (3 - Moderna risk series) 10/17/2019   Medicare Annual Wellness (AWV)  06/24/2023   Health Maintenance Items Addressed: See Nurse Notes  Additional Screening:  Vision Screening: Recommended annual ophthalmology exams for early detection of glaucoma and  other disorders of the eye.  Dental Screening: Recommended annual dental exams for proper oral hygiene  Community Resource Referral / Chronic Care Management: CRR required this visit?  No   CCM required this visit?  No     Plan:     I have personally reviewed and noted the following in the patient's chart:   Medical and social history Use of alcohol, tobacco or illicit drugs  Current medications and supplements including opioid prescriptions. Patient is not currently taking opioid prescriptions. Functional ability and status Nutritional status Physical activity Advanced directives List of other physicians Hospitalizations, surgeries, and ER visits in previous 12 months Vitals Screenings to include cognitive, depression, and falls Referrals and appointments  In addition, I have reviewed and discussed with patient certain preventive protocols, quality metrics,  and best practice recommendations. A written personalized care plan for preventive services as well as general preventive health recommendations were provided to patient.     Geroge Gilliam L Chenita Ruda, CMA   05/11/2023   After Visit Summary: (MyChart) Due to this being a telephonic visit, the after visit summary with patients personalized plan was offered to patient via MyChart   Notes: Please refer to Routing Comments.

## 2023-05-13 ENCOUNTER — Ambulatory Visit (HOSPITAL_BASED_OUTPATIENT_CLINIC_OR_DEPARTMENT_OTHER)

## 2023-05-13 ENCOUNTER — Ambulatory Visit (HOSPITAL_BASED_OUTPATIENT_CLINIC_OR_DEPARTMENT_OTHER): Admitting: Student

## 2023-05-13 DIAGNOSIS — M2012 Hallux valgus (acquired), left foot: Secondary | ICD-10-CM | POA: Diagnosis not present

## 2023-05-13 DIAGNOSIS — M19072 Primary osteoarthritis, left ankle and foot: Secondary | ICD-10-CM | POA: Diagnosis not present

## 2023-05-13 DIAGNOSIS — S92345A Nondisplaced fracture of fourth metatarsal bone, left foot, initial encounter for closed fracture: Secondary | ICD-10-CM | POA: Diagnosis not present

## 2023-05-13 DIAGNOSIS — M25572 Pain in left ankle and joints of left foot: Secondary | ICD-10-CM | POA: Diagnosis not present

## 2023-05-13 DIAGNOSIS — M79672 Pain in left foot: Secondary | ICD-10-CM | POA: Diagnosis not present

## 2023-05-13 NOTE — Progress Notes (Signed)
 Chief Complaint: Left foot pain     History of Present Illness:    Olivia Evans is a 89 y.o. female presenting today for evaluation of pain in her left foot.  Patient was in her recliner yesterday and states that she tried to get up too quick, causing her to get tripped up and fall.  Shortly after the fall, she developed some pain in her left foot, mainly of the 4th and 5th toes.  States that she does have some swelling in the area, although she does have some edema in the foot and ankle at baseline.  Denies any numbness or tingling.  She does have history of osteoporosis as well as multiple fractures involving the left foot.   Surgical History:   None  PMH/PSH/Family History/Social History/Meds/Allergies:    Past Medical History:  Diagnosis Date   Arthritis KNEES   Bladder pain    Chronic constipation    Diverticulosis 2013   Dr Sandrea Cruel   Dyslipidemia    Heart murmur    Hemorrhoids    History of adenomatous polyp of colon    2003 -- VILLOUS   History of breast cancer 2001---RIGHT BREAST CANCER   S/P PARTIAL MASTECTOMY AND RADIATION--  NO RECURRENCE   History of palpitations    Incomplete right bundle branch block (RBBB)    Interstitial cystitis    Dr McDiarmid   Lower urinary tract symptoms (LUTS)    Normal cardiac stress test    2000  PER MD NOTE   Osteopenia    Wears dentures    full -upper/  partial lower   Wears glasses    Past Surgical History:  Procedure Laterality Date   BENIGN TUMOR REMOVED  1960'S   SECOND RIB   CATARACT EXTRACTION W/ INTRAOCULAR LENS  IMPLANT, BILATERAL  feb 2016   CYSTO WITH HYDRODISTENSION  07/29/2011   Procedure: CYSTOSCOPY/HYDRODISTENSION;  Surgeon: Devorah Fonder, MD;  Location: [facility];  Service: Urology;  Laterality: N/A;  with fulguration of bladder ulcer   CYSTO WITH HYDRODISTENSION N/A 08/28/2014   Procedure: CYSTO/HYDRODISTENSION, FULGURATION OF ULCERS;  Surgeon: Erman Hayward, MD;  Location: [facility];  Service: Urology;  Laterality: N/A;   CYSTO/  HYDRODISTENTION/  INSTILLATION THERAPY  x2  01-18- and 11-15- 2011   CYSTO/ HOD/  BLADDER BX'S/  FULGERATION HUNNER ULCER'S /  INSTILLATION THERAPY  10-01-2010   PARTIAL MASTECTOMY W/ SLN DISSECTION Right 12-19-1999   ROTATOR CUFF REPAIR     TUBAL LIGATION     VAGINAL HYSTERECTOMY  1970'S   WITH APPENDECTOMY   VARICOSE VEIN SURGERY     Social History   Socioeconomic History   Marital status: Widowed    Spouse name: Not on file   Number of children: 4   Years of education: Not on file   Highest education level: Not on file  Occupational History   Occupation: Retired  Tobacco Use   Smoking status: Never   Smokeless tobacco: Never  Vaping Use   Vaping status: Never Used  Substance and Sexual Activity   Alcohol use: No    Alcohol/week: 0.0 standard drinks of alcohol   Drug use: No   Sexual activity: Not on file  Other Topics Concern   Not on file  Social History Narrative  Are you right handed or left handed? RIght   Are you currently employed ? NO   What is your current occupation? Retired   Do you live at home alone? yes   Who lives with you?    What type of home do you live in: 1 story or 2 story? 1       Social Drivers of Corporate investment banker Strain: Low Risk  (05/11/2023)   Overall Financial Resource Strain (CARDIA)    Difficulty of Paying Living Expenses: Not hard at all  Food Insecurity: No Food Insecurity (05/11/2023)   Hunger Vital Sign    Worried About Running Out of Food in the Last Year: Never true    Ran Out of Food in the Last Year: Never true  Transportation Needs: No Transportation Needs (05/11/2023)   PRAPARE - Administrator, Civil Service (Medical): No    Lack of Transportation (Non-Medical): No  Physical Activity: Inactive (05/11/2023)   Exercise Vital Sign    Days of Exercise per Week: 0 days    Minutes of Exercise per Session:  0 min  Stress: No Stress Concern Present (05/11/2023)   [employer] of Occupational Health - Occupational Stress Questionnaire    Feeling of Stress : Not at all  Social Connections: Moderately Integrated (05/11/2023)   Social Connection and Isolation Panel [NHANES]    Frequency of Communication with Friends and Family: More than three times a week    Frequency of Social Gatherings with Friends and Family: Once a week    Attends Religious Services: More than 4 times per year    Active Member of [employer] or Organizations: Yes    Attends Banker Meetings: Never    Marital Status: Widowed   Family History  Problem Relation Age of Onset   Heart disease Mother         CHF; no MI   Dementia Sister    Breast cancer Sister        S/P bilateral mastectomy   Stroke Brother 41   Diabetes Maternal Grandmother    Thyroid  disease Other        3 daughters   Colon cancer Neg Hx    Esophageal cancer Neg Hx    Stomach cancer Neg Hx    Rectal cancer Neg Hx    Allergies  Allergen Reactions   Premarin [Conjugated Estrogens] Other (See Comments)    TABS--  CAUSE PHLEBITIS   Vicodin [Hydrocodone -Acetaminophen ] Itching    SEVERE   Adhesive [Tape] Other (See Comments)    IRRITATION   Fluvastatin Sodium Other (See Comments)    HEART PALPITATIONS   Simvastatin Other (See Comments)    SEVERE LEG ACHES   Current Outpatient Medications  Medication Sig Dispense Refill   acetaminophen  (TYLENOL ) 325 MG tablet Take 325 mg by mouth every 8 (eight) hours as needed for moderate pain.     Apoaequorin (PREVAGEN PO) Take by mouth.     B Complex Vitamins (B COMPLEX-B12 PO) Take by mouth.     Cholecalciferol (VITAMIN D3) 1000 UNITS CAPS Take 4,000 Units by mouth daily.     Ferrous Sulfate (IRON PO) Take 1 tablet by mouth 2 (two) times daily. Patient takes Mega-Food Blood Builder Iron Minis     Mag Aspart-Potassium Aspart 90-90 MG CAPS Take by mouth.     Multiple Vitamins-Minerals (PRESERVISION  AREDS 2 PO) Take 1 capsule by mouth in the morning and at bedtime.     OVER  THE COUNTER MEDICATION Take 1 tablet by mouth at bedtime as needed (laxitive). PruneLax     Polyethyl Glycol-Propyl Glycol (SYSTANE OP) Place 1 drop into both eyes daily. To both eyes.     torsemide  (DEMADEX ) 20 MG tablet TAKE 1 TABLET BY MOUTH EVERY DAY 90 tablet 1   No current facility-administered medications for this visit.   No results found.  Review of Systems:   A ROS was performed including pertinent positives and negatives as documented in the HPI.  Physical Exam :   Constitutional: NAD and appears stated age Neurological: Alert and oriented Psych: Appropriate affect and cooperative There were no vitals taken for this visit.   Comprehensive Musculoskeletal Exam:    Left foot exam demonstrates no evidence of obvious deformity.  Tenderness with palpation over the dorsal aspect of the foot at the 4th and 5th metatarsal heads.  No tenderness at the fifth metatarsal base.  Diffuse edema within the foot, ankle, and left lower extremity without overlying erythema or ecchymosis.  Pedal pulses palpable.  Imaging:   Xray (left foot 3 views): Cortical irregularity noted in the base of the fourth metatarsal head.  Lucency noted at the base of the fifth metatarsal which may represent prior injury.  No other acute abnormality seen.   I personally reviewed and interpreted the radiographs.   Assessment:   89 y.o. female with acute left foot pain due to a fall sustained yesterday.  X-rays taken today do show a cortical irregularity at the base of the fourth metatarsal head, which is suggestive of a nondisplaced fracture as she has pinpoint tenderness over this area.  There is also a lucency at the fifth metatarsal base, although she has no pain or swelling in this area, therefore may likely represent prior injury.  Patient has utilized a walking boot in the past for foot fractures, although I believe this would be too  cumbersome and disruptive to her gait.  I have recommended use of a postop shoe as long as she tolerates this, and she can wean out of this as symptoms hopefully begin to improve.  Should she continue to have pain in 3 to 4 weeks, I have recommended follow-up for reevaluation and repeat x-ray.  Plan :    - Return to clinic as needed     I personally saw and evaluated the patient, and participated in the management and treatment plan.  Sharrell Deck, PA-C Orthopedics

## 2023-05-27 ENCOUNTER — Other Ambulatory Visit: Payer: Self-pay | Admitting: Internal Medicine

## 2023-05-27 DIAGNOSIS — R600 Localized edema: Secondary | ICD-10-CM

## 2023-06-24 DIAGNOSIS — Z961 Presence of intraocular lens: Secondary | ICD-10-CM | POA: Diagnosis not present

## 2023-06-24 DIAGNOSIS — H04123 Dry eye syndrome of bilateral lacrimal glands: Secondary | ICD-10-CM | POA: Diagnosis not present

## 2023-06-24 DIAGNOSIS — H353131 Nonexudative age-related macular degeneration, bilateral, early dry stage: Secondary | ICD-10-CM | POA: Diagnosis not present

## 2023-06-24 DIAGNOSIS — H18413 Arcus senilis, bilateral: Secondary | ICD-10-CM | POA: Diagnosis not present

## 2023-07-02 ENCOUNTER — Encounter (INDEPENDENT_AMBULATORY_CARE_PROVIDER_SITE_OTHER): Admitting: Ophthalmology

## 2023-07-02 DIAGNOSIS — H43813 Vitreous degeneration, bilateral: Secondary | ICD-10-CM

## 2023-07-02 DIAGNOSIS — H353132 Nonexudative age-related macular degeneration, bilateral, intermediate dry stage: Secondary | ICD-10-CM | POA: Diagnosis not present

## 2023-07-04 IMAGING — CT CT BIOPSY
1 of 5 series · 14 of 28 positions shown, 18 images · non-contrast
Comparison: none

INDICATION: [AGE] female with macrocytic anemia and leukopenia. She
presents for bone marrow biopsy to evaluate for myelodysplastic
syndrome.

[Series 2: i-spiral 5.0 br40 · axial · 0.98mm/px · z∈[+1153,+1216]mm · 14 of 22 slices shown, 18 images]
[im 2/22  mediastinal]
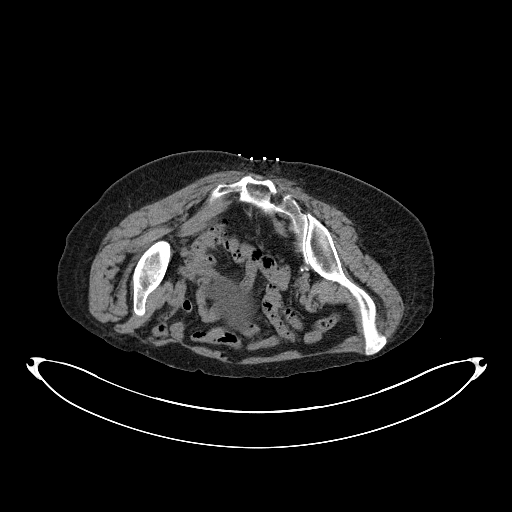
[im 2/22  lung]
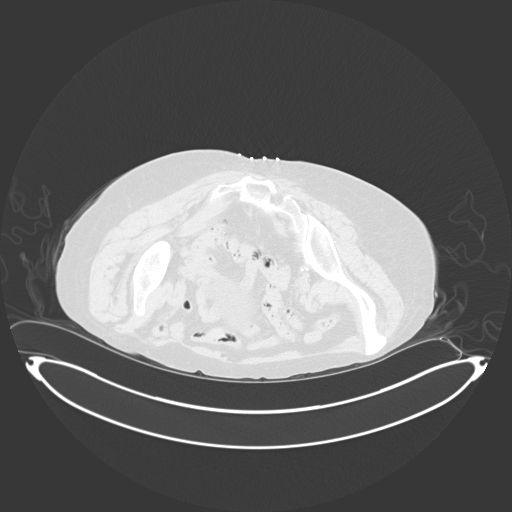
[im 3/22  lung]
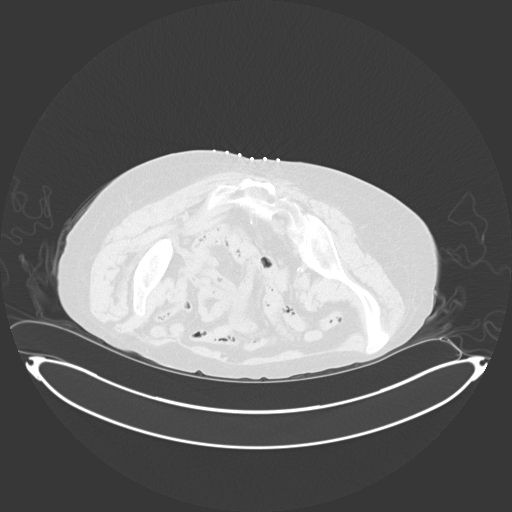
[im 5/22  lung]
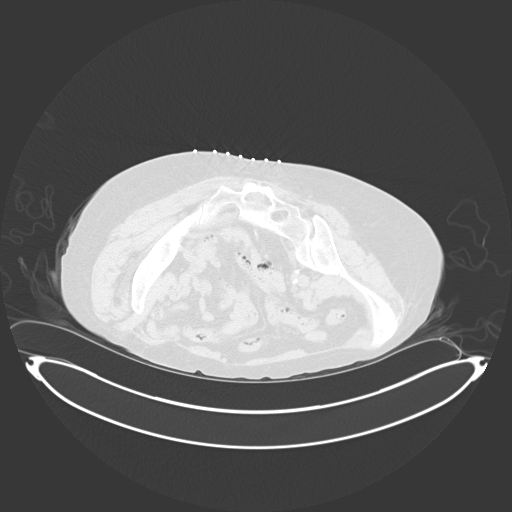
[im 6/22  lung]
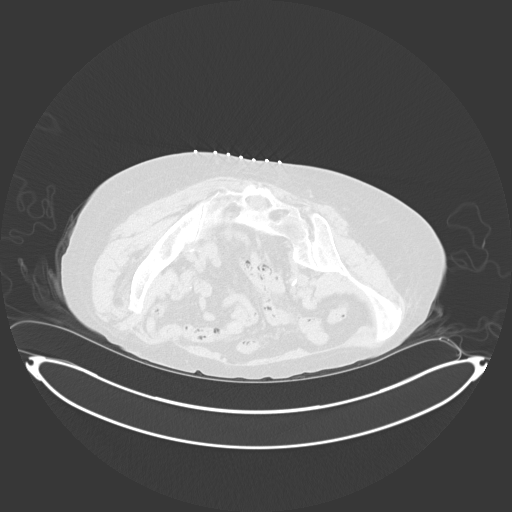
[im 8/22  mediastinal]
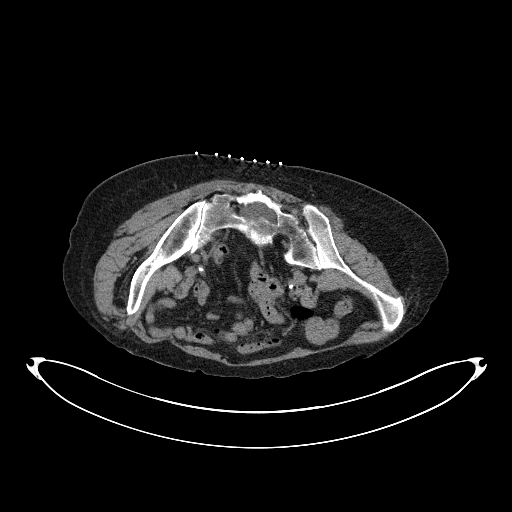
[im 8/22  lung]
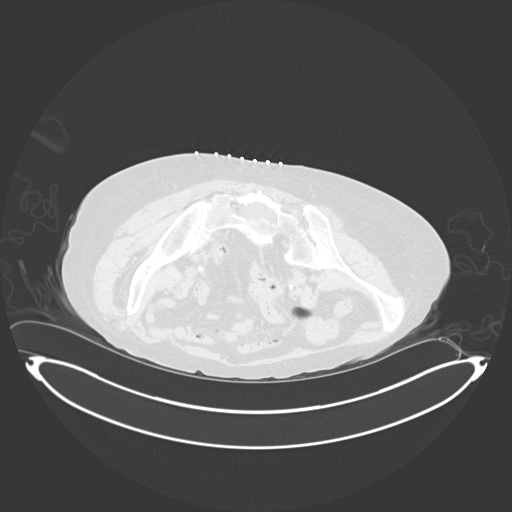
[im 9/22  lung]
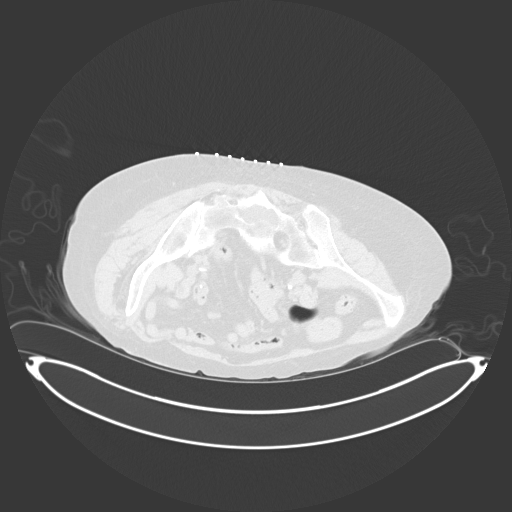
[im 10/22  lung]
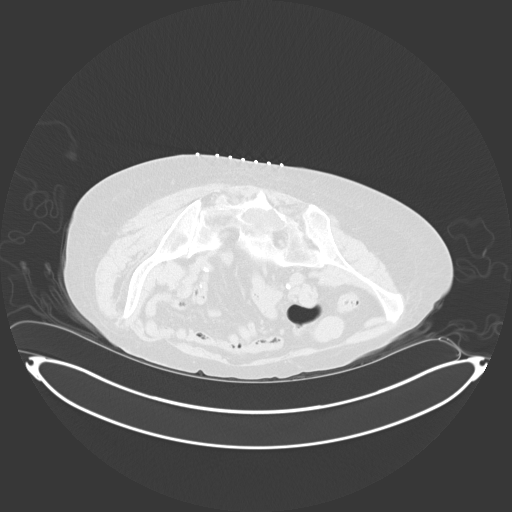
[im 12/22  lung]
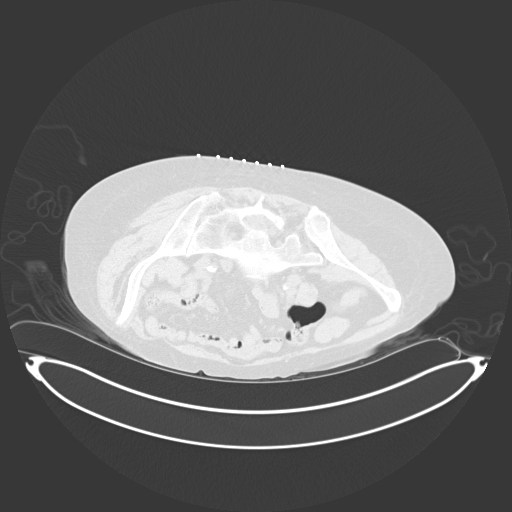
[im 13/22  mediastinal]
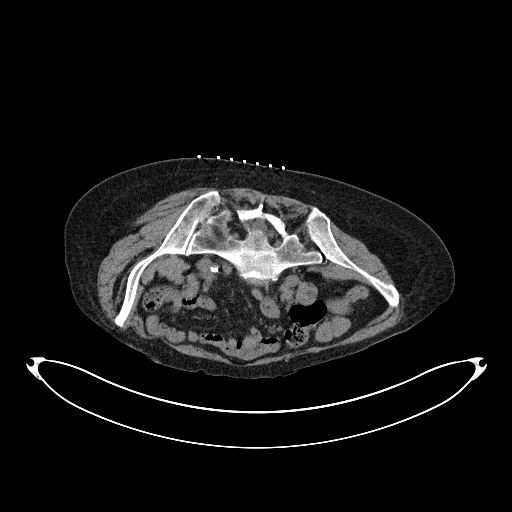
[im 13/22  lung]
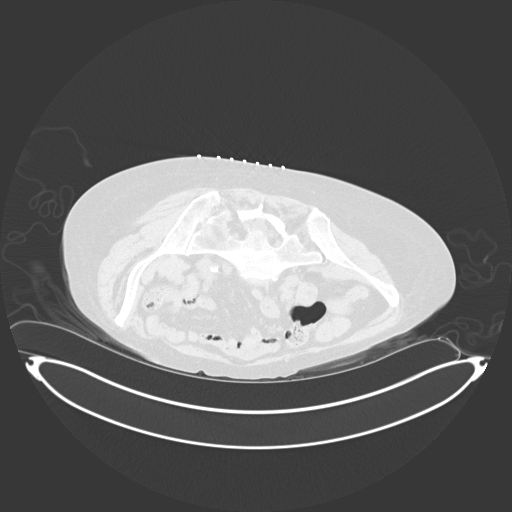
[im 15/22  lung]
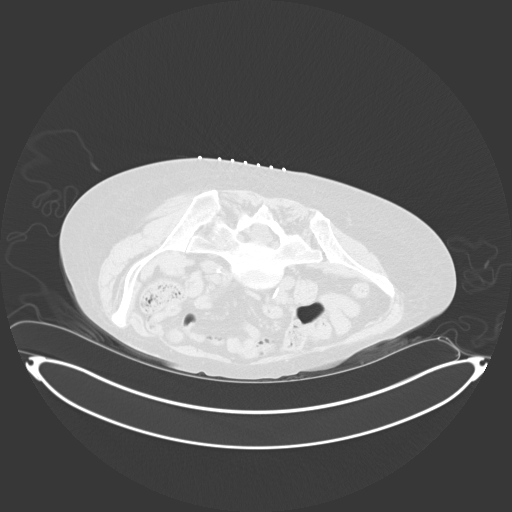
[im 16/22  lung]
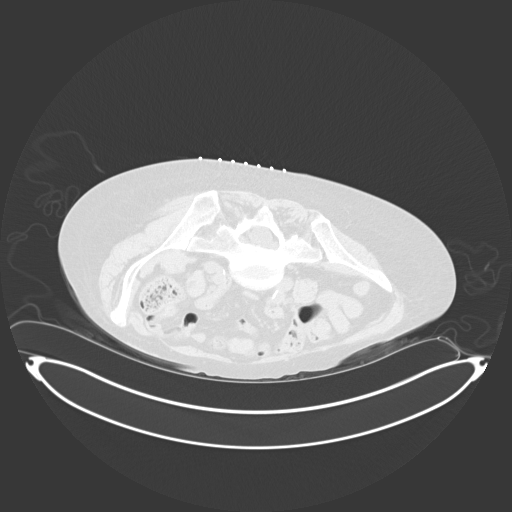
[im 17/22  lung]
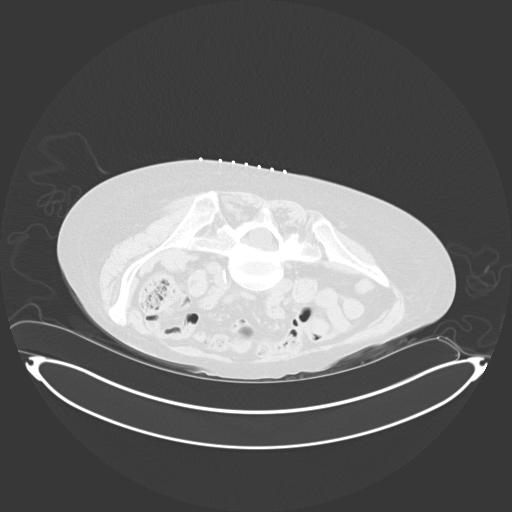
[im 19/22  mediastinal]
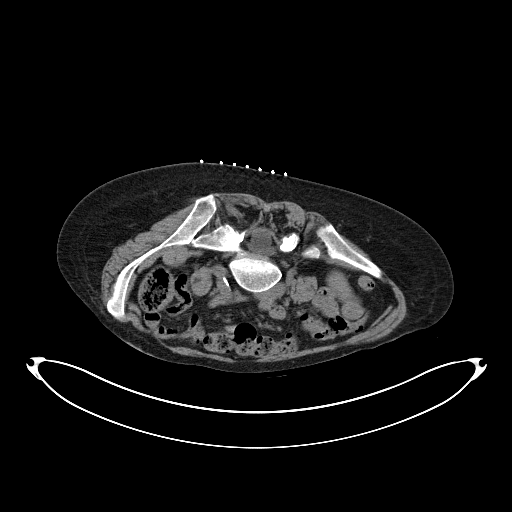
[im 19/22  lung]
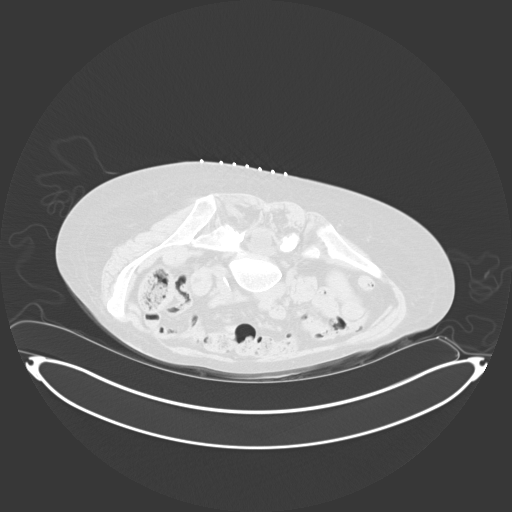
[im 20/22  lung]
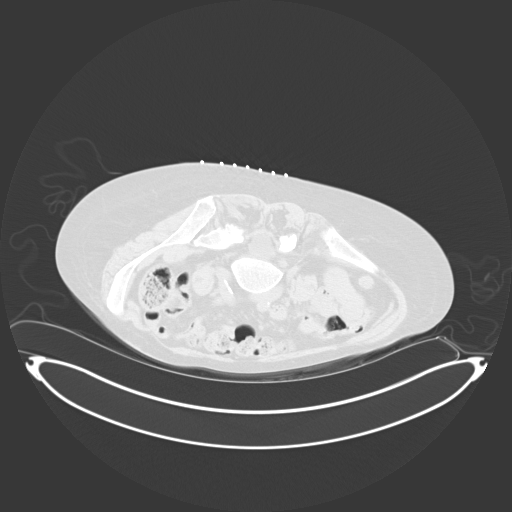

[14 of 28 positions shown; findings below may reference images not displayed]

EXAM:
CT GUIDED BONE MARROW ASPIRATION AND CORE BIOPSY

MEDICATIONS:
None.

ANESTHESIA/SEDATION:
Moderate (conscious) sedation was employed during this procedure. A
total of 1.5 milligrams versed and 100 micrograms fentanyl were
administered intravenously. The patient's level of consciousness and
vital signs were monitored continuously by radiology nursing
throughout the procedure under my direct supervision.

Total monitored sedation time: 10 minutes

FLUOROSCOPY TIME:  None

COMPLICATIONS:
None immediate.

Estimated blood loss: <25 mL

PROCEDURE:
Informed written consent was obtained from the patient after a
thorough discussion of the procedural risks, benefits and
alternatives. All questions were addressed. Maximal Sterile Barrier
Technique was utilized including caps, mask, sterile gowns, sterile
gloves, sterile drape, hand hygiene and skin antiseptic. A timeout
was performed prior to the initiation of the procedure.

The patient was positioned prone and non-contrast localization CT
was performed of the pelvis to demonstrate the iliac marrow spaces.

Maximal barrier sterile technique utilized including caps, mask,
sterile gowns, sterile gloves, large sterile drape, hand hygiene,
and betadine prep.


## 2023-07-07 ENCOUNTER — Telehealth: Payer: Self-pay | Admitting: Internal Medicine

## 2023-07-07 ENCOUNTER — Ambulatory Visit: Admitting: Physician Assistant

## 2023-07-07 ENCOUNTER — Encounter: Payer: Self-pay | Admitting: Physical Therapy

## 2023-07-07 DIAGNOSIS — G8929 Other chronic pain: Secondary | ICD-10-CM

## 2023-07-07 NOTE — Telephone Encounter (Signed)
 Copied from CRM [id number]. Topic: Referral - Request for Referral >> Jul 07, 2023  1:32 PM Laquanda P wrote: Did the patient discuss referral with their provider in the last year? Yes (If No - schedule appointment) (If Yes - send message)  Appointment offered? No  Type of order/referral and detailed reason for visit: [place] - Neck is still bothering her  Preference of office, provider, location: [facility] - Jerrel Mor  If referral order, have you been seen by this specialty before? Yes (If Yes, this issue or another issue? When? Where? This issue, same facility same provider  Can we respond through MyChart? Yes

## 2023-07-08 NOTE — Telephone Encounter (Signed)
 Referral ordered

## 2023-07-08 NOTE — Telephone Encounter (Signed)
 Spoke with patient's daughter today.

## 2023-07-14 ENCOUNTER — Encounter: Payer: Self-pay | Admitting: Physical Therapy

## 2023-07-14 ENCOUNTER — Ambulatory Visit (INDEPENDENT_AMBULATORY_CARE_PROVIDER_SITE_OTHER): Admitting: Physical Therapy

## 2023-07-14 DIAGNOSIS — R2681 Unsteadiness on feet: Secondary | ICD-10-CM

## 2023-07-14 DIAGNOSIS — M542 Cervicalgia: Secondary | ICD-10-CM

## 2023-07-14 DIAGNOSIS — M6281 Muscle weakness (generalized): Secondary | ICD-10-CM

## 2023-07-14 NOTE — Therapy (Signed)
 OUTPATIENT PHYSICAL THERAPY CERVICAL EVALUATION   Patient Name: Olivia Evans MRN: [id number] DOB:[birth year], 89 y.o., female Today's Date: 07/14/2023  END OF SESSION:  PT End of Session - 07/14/23 1022     Visit Number 1    Number of Visits 20    Date for PT Re-Evaluation 09/25/23    Progress Note Due on Visit 10    PT Start Time 1018    PT Stop Time 1058    PT Time Calculation (min) 40 min    Activity Tolerance Patient tolerated treatment well    Behavior During Therapy [facility] for tasks assessed/performed          Past Medical History:  Diagnosis Date   Arthritis KNEES   Bladder pain    Chronic constipation    Diverticulosis 2013   Dr Aneita   Dyslipidemia    Heart murmur    Hemorrhoids    History of adenomatous polyp of colon    2003 -- VILLOUS   History of breast cancer 2001---RIGHT BREAST CANCER   S/P PARTIAL MASTECTOMY AND RADIATION--  NO RECURRENCE   History of palpitations    Incomplete right bundle branch block (RBBB)    Interstitial cystitis    Dr McDiarmid   Lower urinary tract symptoms (LUTS)    Normal cardiac stress test    2000  PER MD NOTE   Osteopenia    Wears dentures    full -upper/  partial lower   Wears glasses    Past Surgical History:  Procedure Laterality Date   BENIGN TUMOR REMOVED  1960'S   SECOND RIB   CATARACT EXTRACTION W/ INTRAOCULAR LENS  IMPLANT, BILATERAL  feb 2016   CYSTO WITH HYDRODISTENSION  07/29/2011   Procedure: CYSTOSCOPY/HYDRODISTENSION;  Surgeon: Glendia DELENA Elizabeth, MD;  Location: [facility];  Service: Urology;  Laterality: N/A;  with fulguration of bladder ulcer   CYSTO WITH HYDRODISTENSION N/A 08/28/2014   Procedure: CYSTO/HYDRODISTENSION, FULGURATION OF ULCERS;  Surgeon: Glendia Elizabeth, MD;  Location: [facility];  Service: Urology;  Laterality: N/A;   CYSTO/  HYDRODISTENTION/  INSTILLATION THERAPY  x2  01-18- and 11-15- 2011   CYSTO/ HOD/  BLADDER BX'S/  FULGERATION HUNNER ULCER'S /   INSTILLATION THERAPY  10-01-2010   PARTIAL MASTECTOMY W/ SLN DISSECTION Right 12-19-1999   ROTATOR CUFF REPAIR     TUBAL LIGATION     VAGINAL HYSTERECTOMY  1970'S   WITH APPENDECTOMY   VARICOSE VEIN SURGERY     Patient Active Problem List   Diagnosis Date Noted   Pain and swelling of lower extremity, right 01/16/2022   Murmur 11/06/2021   Myelodysplasia (myelodysplastic syndrome) (HCC) 11/05/2021   Neck pain 10/07/2021   Occipital neuralgia of left side 10/04/2021   Decreased hearing of both ears 03/08/2021   Bicytopenia 10/23/2020   History of breast cancer on right 10/16/2020   Anemia 11/28/2018   Prediabetes 10/13/2018   Bilateral leg edema 09/12/2017   Bilateral primary osteoarthritis of knee 12/22/2016   SOB (shortness of breath) on exertion 10/15/2016   Osteoporosis 03/26/2016   Diverticulosis of colon without hemorrhage 11/15/2013   Vitamin D  deficiency 06/30/2012   INTERSTITIAL CYSTITIS 10/09/2009   Constipation 12/13/2007   History of colonic polyps 12/13/2007   SYNCOPE 06/24/2006    PCP: Geofm Glade PARAS, MD   REFERRING PROVIDER: Geofm Glade PARAS, MD   REFERRING DIAG:  Diagnosis  M54.2,G89.29 (ICD-10-CM) - Chronic neck pain    THERAPY DIAG:  Cervicalgia  Muscle  weakness (generalized)  Unsteadiness on feet  Rationale for Evaluation and Treatment: Rehabilitation  ONSET DATE: Years  SUBJECTIVE:                                                                                                                                                                                                         SUBJECTIVE STATEMENT: Pt arriving to therapy c/o neck pain. Pt stating when she wakes up her neck pops and she has difficulty with all neck movements.   PERTINENT HISTORY:  See PMH  PAIN:  NPRS scale: no pain at rest, 5/10 Pain location: posterior neck Pain description: achy Aggravating factors: extension, rotation Relieving factors: resting  PRECAUTIONS:  Other: h/o breast cancer    WEIGHT BEARING RESTRICTIONS: No  FALLS:  Has patient fallen in last 6 months? No  LIVING ENVIRONMENT: Lives with: lives with their family and lives alone Lives in: House/apartment Stairs: Yes: External: 3 steps; on right going up Has following equipment at home: Single point cane and Grab bars  OCCUPATION: retired  PLOF: Independent  PATIENT GOALS: Be able to move my neck without pain  Next MD visit:  OBJECTIVE:   DIAGNOSTIC FINDINGS:  2023:  2 view radiographs of her cervical spine demonstrate overall  well-maintained alignment.  She does have degenerative changes especially  at C4-5 C5-6 C6-7.  Do not see any acute fractures or other osseous  injuries   PATIENT SURVEYS: Patient-Specific Activity Scoring Scheme  0 represents "unable to perform." 10 represents "able to perform at prior level. 0 1 2 3 4 5 6 7 8 9  10 (Date and Score)   Activity Eval  07/14/23    1. Looking up  1    2. Turning head 2     3.     4.    5.    Score 1.5    Total score = sum of the activity scores/number of activities Minimum detectable change (90%CI) for average score = 2 points Minimum detectable change (90%CI) for single activity score = 3 points    COGNITION: Overall cognitive status: Within functional limits for tasks assessed  SENSATION: WFL  POSTURE:  rounded shoulders, forward head, and decreased lumbar lordosis  PALPATION: TTP: cervical paraspinals and bilateral upper traps   CERVICAL ROM:   ROM AROM (deg) Eval 07/14/23  Flexion 40  Extension 8  Right lateral flexion 28 Resting position is 8 tilted to Rt  Left lateral flexion 12, c pain  Right rotation 34  Left rotation 36   (Blank rows = not tested)  UPPER EXTREMITY ROM:   ROM Right Eval 07/14/23 Left Eval 07/14/23  Shoulder flexion 160 154  Shoulder extension 54 56  Shoulder abduction 155 155  Shoulder adduction    Shoulder extension    Shoulder internal  rotation    Shoulder external rotation    Elbow flexion    Elbow extension    Wrist flexion    Wrist extension    Wrist ulnar deviation    Wrist radial deviation    Wrist pronation    Wrist supination     (Blank rows = not tested)  UPPER EXTREMITY MMT:  MMT Right Eval 07/14/23 Left eval  Shoulder flexion 3.6 ppsi 4.4 ppsi  Shoulder extension    Shoulder abduction    Shoulder adduction    Shoulder extension    Shoulder internal rotation    Shoulder external rotation    Middle trapezius    Lower trapezius    Elbow flexion    Elbow extension    Wrist flexion    Wrist extension    Wrist ulnar deviation    Wrist radial deviation    Wrist pronation    Wrist supination    Grip strength     (Blank rows = not tested)  FUNCTIONAL TESTS:  07/14/23 Timed up and go (TUG): 17.41, c UE support                                                                                                                                                                                  TODAY'S TREATMENT:                                                                                                       DATE:  07/14/23 Therex: HEP instruction/performance c cues for techniques, handout provided.  Trial set performed of each for comprehension and symptom assessment.  See below for exercise list  PATIENT EDUCATION:  Education details: HEP, POC Person educated: Patient Education method: Explanation, Demonstration, Verbal cues, and Handouts Education comprehension: verbalized understanding, returned demonstration, and verbal cues required  HOME EXERCISE PROGRAM: Access Code: [email or web address] URL: [email or web address] Date: 07/14/2023 Prepared by: Delon Lunger  Exercises - Supine Cervical Retraction with Towel  - 2 x daily - 7 x weekly - 10  reps - 5 seconds hold - Supine Chin Tuck  - 2 x daily - 7 x weekly - 10 reps - 5 seconds hold - Supine Cervical Rotation PROM  - 2 x daily - 7  x weekly - 5 reps - 5 second hold - Seated Upper Trapezius Stretch  - 2 x daily - 7 x weekly - 5 reps - 5 seconds hold  ASSESSMENT:  CLINICAL IMPRESSION: Patient is a 89 y.o. who comes to clinic with complaints of cervical pain. Pt also reporting difficulty maintaining her balance when first standing and history of posterior leaning with initial standing. Pt presenting with mobility, strength and movement coordination deficits that impair their ability to perform usual daily and recreational functional activities without increase difficulty/symptoms at this time.  Patient to benefit from skilled PT services to address impairments and limitations to improve to previous level of function without restriction secondary to condition.    OBJECTIVE IMPAIRMENTS: decreased balance, impaired flexibility, postural dysfunction, and pain.   ACTIVITY LIMITATIONS: lifting, dressing, and reach over head  PARTICIPATION LIMITATIONS: community activity  PERSONAL FACTORS: see PMH above  are also affecting patient's functional outcome.   REHAB POTENTIAL: Good  CLINICAL DECISION MAKING: Stable/uncomplicated  EVALUATION COMPLEXITY: Low   GOALS: Goals reviewed with patient? Yes  SHORT TERM GOALS: (target date for Short term goals are 3 weeks 08/04/2023)  1.Patient will demonstrate independent use of home exercise program to maintain progress from in clinic treatments. Goal status: New  LONG TERM GOALS: (target dates for all long term goals are 8 weeks  09/11/2023)   1. Patient will demonstrate/report pain at worst less than or equal to 2/10 to facilitate minimal limitation in daily activity secondary to pain symptoms. Goal status: New   2. Patient will demonstrate independent use of home exercise program to facilitate ability to maintain/progress functional gains from skilled physical therapy services. Goal status: New   3. Patient will demonstrate Patient specific functional scale avg > or = 3.5 to  indicate reduced disability due to condition.  Goal status: New   4.  Patient will demonstrate cervical extension to >/= 12 degrees s symptoms to facilitate usual head movements for daily activity including driving, self care.   Goal status: New   5.  Pt will improve bilateral cervical rotation to >/= 45 degrees for improvements in functional movements.   Goal status: New     PLAN:  PT FREQUENCY: 1-2x/week  PT DURATION: 10 weeks  Can include 02853- PT Re-evaluation, 97110-Therapeutic exercises, 97530- Therapeutic activity, [id number]- Neuromuscular re-education, 97535- Self Care, 97140- Manual therapy, [phone]- Gait training, [phone]- Orthotic Fit/training, [phone]- Canalith repositioning, [id number]- Aquatic Therapy, [phone]- Electrical stimulation (unattended), [id number] Physical performance testing, 97016- Vasopneumatic device, [id number]- Ultrasound, [id number]- Traction (mechanical), [id number]- Ionotophoresis 4mg /ml Dexamethasone ,  79439 - Needle insertion w/o injection 1 or 2 muscles, 20561 - Needle insertion w/o injection 3 or more muscles.   Patient/Family education, Balance training, Stair training, Taping, Dry Needling, Joint mobilization, Joint manipulation, Spinal manipulation, Spinal mobilization, Scar mobilization, Vestibular training, Visual/preceptual remediation/compensation, DME instructions, Cryotherapy, and Moist heat.  All performed as medically necessary.  All included unless contraindicated  PLAN FOR NEXT SESSION: Check HEP use/response, cervical ROM Pt wishing to try traction, (she's had in past therapy sessions c gentle pull)    Delon JONELLE Lunger, PT, MPT 07/14/2023, 11:30 AM

## 2023-07-20 ENCOUNTER — Encounter: Payer: Self-pay | Admitting: Physical Therapy

## 2023-07-20 ENCOUNTER — Ambulatory Visit: Admitting: Physical Therapy

## 2023-07-20 DIAGNOSIS — R2681 Unsteadiness on feet: Secondary | ICD-10-CM | POA: Diagnosis not present

## 2023-07-20 DIAGNOSIS — M6281 Muscle weakness (generalized): Secondary | ICD-10-CM

## 2023-07-20 DIAGNOSIS — M542 Cervicalgia: Secondary | ICD-10-CM

## 2023-07-20 NOTE — Therapy (Signed)
 OUTPATIENT PHYSICAL THERAPY CERVICAL EVALUATION   Patient Name: Olivia Evans MRN: [id number] DOB:09/24/1926, 89 y.o., female Today's Date: 07/20/2023  END OF SESSION:  PT End of Session - 07/20/23 1458     Visit Number 2    Number of Visits 20    Date for PT Re-Evaluation 09/25/23    Progress Note Due on Visit 10    PT Start Time 1430    PT Stop Time 1515    PT Time Calculation (min) 45 min    Activity Tolerance Patient tolerated treatment well    Behavior During Therapy [facility] for tasks assessed/performed          Past Medical History:  Diagnosis Date   Arthritis KNEES   Bladder pain    Chronic constipation    Diverticulosis 2013   Dr Aneita   Dyslipidemia    Heart murmur    Hemorrhoids    History of adenomatous polyp of colon    2003 -- VILLOUS   History of breast cancer 2001---RIGHT BREAST CANCER   S/P PARTIAL MASTECTOMY AND RADIATION--  NO RECURRENCE   History of palpitations    Incomplete right bundle branch block (RBBB)    Interstitial cystitis    Dr McDiarmid   Lower urinary tract symptoms (LUTS)    Normal cardiac stress test    2000  PER MD NOTE   Osteopenia    Wears dentures    full -upper/  partial lower   Wears glasses    Past Surgical History:  Procedure Laterality Date   BENIGN TUMOR REMOVED  1960'S   SECOND RIB   CATARACT EXTRACTION W/ INTRAOCULAR LENS  IMPLANT, BILATERAL  feb 2016   CYSTO WITH HYDRODISTENSION  07/29/2011   Procedure: CYSTOSCOPY/HYDRODISTENSION;  Surgeon: Glendia DELENA Elizabeth, MD;  Location: [facility] [place];  Service: Urology;  Laterality: N/A;  with fulguration of bladder ulcer   CYSTO WITH HYDRODISTENSION N/A 08/28/2014   Procedure: CYSTO/HYDRODISTENSION, FULGURATION OF ULCERS;  Surgeon: Glendia Elizabeth, MD;  Location: [facility];  Service: Urology;  Laterality: N/A;   CYSTO/  HYDRODISTENTION/  INSTILLATION THERAPY  x2  01-18- and 11-15- 2011   CYSTO/ HOD/  BLADDER BX'S/  FULGERATION HUNNER ULCER'S /   INSTILLATION THERAPY  10-01-2010   PARTIAL MASTECTOMY W/ SLN DISSECTION Right 12-19-1999   ROTATOR CUFF REPAIR     TUBAL LIGATION     VAGINAL HYSTERECTOMY  1970'S   WITH APPENDECTOMY   VARICOSE VEIN SURGERY     Patient Active Problem List   Diagnosis Date Noted   Pain and swelling of lower extremity, right 01/16/2022   Murmur 11/06/2021   Myelodysplasia (myelodysplastic syndrome) (HCC) 11/05/2021   Neck pain 10/07/2021   Occipital neuralgia of left side 10/04/2021   Decreased hearing of both ears 03/08/2021   Bicytopenia 10/23/2020   History of breast cancer on right 10/16/2020   Anemia 11/28/2018   Prediabetes 10/13/2018   Bilateral leg edema 09/12/2017   Bilateral primary osteoarthritis of knee 12/22/2016   SOB (shortness of breath) on exertion 10/15/2016   Osteoporosis 03/26/2016   Diverticulosis of colon without hemorrhage 11/15/2013   Vitamin D  deficiency 06/30/2012   INTERSTITIAL CYSTITIS 10/09/2009   Constipation 12/13/2007   History of colonic polyps 12/13/2007   SYNCOPE 06/24/2006    PCP: Geofm Glade PARAS, MD   REFERRING PROVIDER: Geofm Glade PARAS, MD   REFERRING DIAG:  Diagnosis  M54.2,G89.29 (ICD-10-CM) - Chronic neck pain    THERAPY DIAG:  Cervicalgia  Muscle  weakness (generalized)  Unsteadiness on feet  Rationale for Evaluation and Treatment: Rehabilitation  ONSET DATE: Years  SUBJECTIVE:                                                                                                                                                                                                         SUBJECTIVE STATEMENT: Pt arriving today with daughter. Pt wishing to try traction. Pt reporting 3-4/10 pain in her neck and upper back.   PERTINENT HISTORY:  See PMH  PAIN:  NPRS scale: no pain at rest, 3-4/10 Pain location: posterior neck Pain description: achy Aggravating factors: extension, rotation Relieving factors: resting  PRECAUTIONS: Other: h/o  breast cancer    WEIGHT BEARING RESTRICTIONS: No  FALLS:  Has patient fallen in last 6 months? No  LIVING ENVIRONMENT: Lives with: lives with their family and lives alone Lives in: House/apartment Stairs: Yes: External: 3 steps; on right going up Has following equipment at home: Single point cane and Grab bars  OCCUPATION: retired  PLOF: Independent  PATIENT GOALS: Be able to move my neck without pain  Next MD visit:  OBJECTIVE:   DIAGNOSTIC FINDINGS:  2023:  2 view radiographs of her cervical spine demonstrate overall  well-maintained alignment.  She does have degenerative changes especially  at C4-5 C5-6 C6-7.  Do not see any acute fractures or other osseous  injuries   PATIENT SURVEYS: Patient-Specific Activity Scoring Scheme  0 represents "unable to perform." 10 represents "able to perform at prior level. 0 1 2 3 4 5 6 7 8 9  10 (Date and Score)   Activity Eval  07/14/23    1. Looking up  1    2. Turning head 2     3.     4.    5.    Score 1.5    Total score = sum of the activity scores/number of activities Minimum detectable change (90%CI) for average score = 2 points Minimum detectable change (90%CI) for single activity score = 3 points    COGNITION: Overall cognitive status: Within functional limits for tasks assessed  SENSATION: WFL  POSTURE:  rounded shoulders, forward head, and decreased lumbar lordosis  PALPATION: TTP: cervical paraspinals and bilateral upper traps   CERVICAL ROM:   ROM AROM (deg) Eval 07/14/23  Flexion 40  Extension 8  Right lateral flexion 28 Resting position is 8 tilted to Rt  Left lateral flexion 12, c pain  Right rotation 34  Left rotation 36   (Blank rows = not tested)  UPPER EXTREMITY ROM:  ROM Right Eval 07/14/23 Left Eval 07/14/23  Shoulder flexion 160 154  Shoulder extension 54 56  Shoulder abduction 155 155  Shoulder adduction    Shoulder extension    Shoulder internal rotation     Shoulder external rotation    Elbow flexion    Elbow extension    Wrist flexion    Wrist extension    Wrist ulnar deviation    Wrist radial deviation    Wrist pronation    Wrist supination     (Blank rows = not tested)  UPPER EXTREMITY MMT:  MMT Right Eval 07/14/23 Left eval  Shoulder flexion 3.6 ppsi 4.4 ppsi  Shoulder extension    Shoulder abduction    Shoulder adduction    Shoulder extension    Shoulder internal rotation    Shoulder external rotation    Middle trapezius    Lower trapezius    Elbow flexion    Elbow extension    Wrist flexion    Wrist extension    Wrist ulnar deviation    Wrist radial deviation    Wrist pronation    Wrist supination    Grip strength     (Blank rows = not tested)  FUNCTIONAL TESTS:  07/14/23 Timed up and go (TUG): 17.41, c UE support                                                                                                                                                                                TODAY'S TREATMENT:                                                                                                       DATE:  07/20/23 Therex: Nustep: LE/UE x 8 minutes Scapular squeezes x 10 holding 3 sec TherActivites:  Rows: x 15 slow eccentrics green TB Shoulder extension: x 15 red TB Mechanical Traction:  7# pull to 15# pull x 20 minutes    TODAY'S TREATMENT:  DATE:  07/14/23 Therex: HEP instruction/performance c cues for techniques, handout provided.  Trial set performed of each for comprehension and symptom assessment.  See below for exercise list  PATIENT EDUCATION:  Education details: HEP, POC Person educated: Patient Education method: Explanation, Demonstration, Verbal cues, and Handouts Education comprehension: verbalized understanding, returned demonstration, and verbal cues required  HOME EXERCISE  PROGRAM: Access Code: [email or web address] URL: [email or web address] Date: 07/14/2023 Prepared by: Delon Lunger  Exercises - Supine Cervical Retraction with Towel  - 2 x daily - 7 x weekly - 10 reps - 5 seconds hold - Supine Chin Tuck  - 2 x daily - 7 x weekly - 10 reps - 5 seconds hold - Supine Cervical Rotation PROM  - 2 x daily - 7 x weekly - 5 reps - 5 second hold - Seated Upper Trapezius Stretch  - 2 x daily - 7 x weekly - 5 reps - 5 seconds hold  ASSESSMENT:  CLINICAL IMPRESSION: Pt arriving today reporting 3-4/10 pain in her neck and upper back. Pt wishing to try traction today with good response. Continue skilled PT interventions.    OBJECTIVE IMPAIRMENTS: decreased balance, impaired flexibility, postural dysfunction, and pain.   ACTIVITY LIMITATIONS: lifting, dressing, and reach over head  PARTICIPATION LIMITATIONS: community activity  PERSONAL FACTORS: see PMH above  are also affecting patient's functional outcome.   REHAB POTENTIAL: Good  CLINICAL DECISION MAKING: Stable/uncomplicated  EVALUATION COMPLEXITY: Low   GOALS: Goals reviewed with patient? Yes  SHORT TERM GOALS: (target date for Short term goals are 3 weeks 08/04/2023)  1.Patient will demonstrate independent use of home exercise program to maintain progress from in clinic treatments. Goal status: New  LONG TERM GOALS: (target dates for all long term goals are 8 weeks  09/11/2023)   1. Patient will demonstrate/report pain at worst less than or equal to 2/10 to facilitate minimal limitation in daily activity secondary to pain symptoms. Goal status: New   2. Patient will demonstrate independent use of home exercise program to facilitate ability to maintain/progress functional gains from skilled physical therapy services. Goal status: New   3. Patient will demonstrate Patient specific functional scale avg > or = 3.5 to indicate reduced disability due to condition.  Goal status: New   4.   Patient will demonstrate cervical extension to >/= 12 degrees s symptoms to facilitate usual head movements for daily activity including driving, self care.   Goal status: New   5.  Pt will improve bilateral cervical rotation to >/= 45 degrees for improvements in functional movements.   Goal status: New     PLAN:  PT FREQUENCY: 1-2x/week  PT DURATION: 10 weeks  Can include 02853- PT Re-evaluation, 97110-Therapeutic exercises, 97530- Therapeutic activity, [id number]- Neuromuscular re-education, 97535- Self Care, 97140- Manual therapy, [phone]- Gait training, [phone]- Orthotic Fit/training, [phone]- Canalith repositioning, [id number]- Aquatic Therapy, [phone]- Electrical stimulation (unattended), [id number] Physical performance testing, 97016- Vasopneumatic device, [id number]- Ultrasound, [id number]- Traction (mechanical), [id number]- Ionotophoresis 4mg /ml Dexamethasone ,  79439 - Needle insertion w/o injection 1 or 2 muscles, 20561 - Needle insertion w/o injection 3 or more muscles.   Patient/Family education, Balance training, Stair training, Taping, Dry Needling, Joint mobilization, Joint manipulation, Spinal manipulation, Spinal mobilization, Scar mobilization, Vestibular training, Visual/preceptual remediation/compensation, DME instructions, Cryotherapy, and Moist heat.  All performed as medically necessary.  All included unless contraindicated  PLAN FOR NEXT SESSION: Check HEP use/response, cervical ROM   How was traction?   Delon JONELLE Lunger, PT, MPT 07/20/2023, 3:22 PM

## 2023-07-27 ENCOUNTER — Ambulatory Visit: Admitting: Physical Therapy

## 2023-07-27 ENCOUNTER — Encounter: Payer: Self-pay | Admitting: Physical Therapy

## 2023-07-27 DIAGNOSIS — M542 Cervicalgia: Secondary | ICD-10-CM

## 2023-07-27 DIAGNOSIS — R2681 Unsteadiness on feet: Secondary | ICD-10-CM | POA: Diagnosis not present

## 2023-07-27 DIAGNOSIS — M6281 Muscle weakness (generalized): Secondary | ICD-10-CM

## 2023-07-27 NOTE — Therapy (Addendum)
 OUTPATIENT PHYSICAL THERAPY CERVICAL  Patient Name: Olivia Evans MRN: [id number] DOB:April 04, 1926, 89 y.o., female Today's Date: 07/27/2023  END OF SESSION:  PT End of Session - 07/27/23 1116     Visit Number 3    Number of Visits 20    Date for PT Re-Evaluation 09/25/23    Progress Note Due on Visit 10    PT Start Time 1113    PT Stop Time 1145    PT Time Calculation (min) 32 min    Activity Tolerance Patient tolerated treatment well    Behavior During Therapy [facility] for tasks assessed/performed          Past Medical History:  Diagnosis Date   Arthritis KNEES   Bladder pain    Chronic constipation    Diverticulosis 2013   Dr Aneita   Dyslipidemia    Heart murmur    Hemorrhoids    History of adenomatous polyp of colon    2003 -- VILLOUS   History of breast cancer 2001---RIGHT BREAST CANCER   S/P PARTIAL MASTECTOMY AND RADIATION--  NO RECURRENCE   History of palpitations    Incomplete right bundle branch block (RBBB)    Interstitial cystitis    Dr McDiarmid   Lower urinary tract symptoms (LUTS)    Normal cardiac stress test    2000  PER MD NOTE   Osteopenia    Wears dentures    full -upper/  partial lower   Wears glasses    Past Surgical History:  Procedure Laterality Date   BENIGN TUMOR REMOVED  1960'S   SECOND RIB   CATARACT EXTRACTION W/ INTRAOCULAR LENS  IMPLANT, BILATERAL  feb 2016   CYSTO WITH HYDRODISTENSION  07/29/2011   Procedure: CYSTOSCOPY/HYDRODISTENSION;  Surgeon: Glendia DELENA Elizabeth, MD;  Location: [facility] [place];  Service: Urology;  Laterality: N/A;  with fulguration of bladder ulcer   CYSTO WITH HYDRODISTENSION N/A 08/28/2014   Procedure: CYSTO/HYDRODISTENSION, FULGURATION OF ULCERS;  Surgeon: Glendia Elizabeth, MD;  Location: [facility] [place];  Service: Urology;  Laterality: N/A;   CYSTO/  HYDRODISTENTION/  INSTILLATION THERAPY  x2  01-18- and 11-15- 2011   CYSTO/ HOD/  BLADDER BX'S/  FULGERATION HUNNER ULCER'S /  INSTILLATION  THERAPY  10-01-2010   PARTIAL MASTECTOMY W/ SLN DISSECTION Right 12-19-1999   ROTATOR CUFF REPAIR     TUBAL LIGATION     VAGINAL HYSTERECTOMY  1970'S   WITH APPENDECTOMY   VARICOSE VEIN SURGERY     Patient Active Problem List   Diagnosis Date Noted   Pain and swelling of lower extremity, right 01/16/2022   Murmur 11/06/2021   Myelodysplasia (myelodysplastic syndrome) (HCC) 11/05/2021   Neck pain 10/07/2021   Occipital neuralgia of left side 10/04/2021   Decreased hearing of both ears 03/08/2021   Bicytopenia 10/23/2020   History of breast cancer on right 10/16/2020   Anemia 11/28/2018   Prediabetes 10/13/2018   Bilateral leg edema 09/12/2017   Bilateral primary osteoarthritis of knee 12/22/2016   SOB (shortness of breath) on exertion 10/15/2016   Osteoporosis 03/26/2016   Diverticulosis of colon without hemorrhage 11/15/2013   Vitamin D  deficiency 06/30/2012   INTERSTITIAL CYSTITIS 10/09/2009   Constipation 12/13/2007   History of colonic polyps 12/13/2007   SYNCOPE 06/24/2006    PCP: Geofm Glade PARAS, MD   REFERRING PROVIDER: Geofm Glade PARAS, MD   REFERRING DIAG:  Diagnosis  M54.2,G89.29 (ICD-10-CM) - Chronic neck pain    THERAPY DIAG:  Cervicalgia  Unsteadiness on feet  Muscle weakness (generalized)  Rationale for Evaluation and Treatment: Rehabilitation  ONSET DATE: Years  SUBJECTIVE:                                                                                                                                                                                                         SUBJECTIVE STATEMENT: Pt arriving today reporting 8/10 pain in her neck.  Pt was 12 minutes late to her appointment today.   PERTINENT HISTORY:  See PMH  PAIN:  NPRS scale: 8/10 pain at times in her neck.  Pain location: posterior neck Pain description: achy Aggravating factors: extension, rotation Relieving factors: resting  PRECAUTIONS: Other: h/o breast  cancer    WEIGHT BEARING RESTRICTIONS: No  FALLS:  Has patient fallen in last 6 months? No  LIVING ENVIRONMENT: Lives with: lives with their family and lives alone Lives in: House/apartment Stairs: Yes: External: 3 steps; on right going up Has following equipment at home: Single point cane and Grab bars  OCCUPATION: retired  PLOF: Independent  PATIENT GOALS: Be able to move my neck without pain  Next MD visit:  OBJECTIVE:   DIAGNOSTIC FINDINGS:  2023:  2 view radiographs of her cervical spine demonstrate overall  well-maintained alignment.  She does have degenerative changes especially  at C4-5 C5-6 C6-7.  Do not see any acute fractures or other osseous  injuries   PATIENT SURVEYS: Patient-Specific Activity Scoring Scheme  0 represents "unable to perform." 10 represents "able to perform at prior level. 0 1 2 3 4 5 6 7 8 9  10 (Date and Score)   Activity Eval  07/14/23    1. Looking up  1    2. Turning head 2     3.     4.    5.    Score 1.5    Total score = sum of the activity scores/number of activities Minimum detectable change (90%CI) for average score = 2 points Minimum detectable change (90%CI) for single activity score = 3 points   COGNITION: Overall cognitive status: Within functional limits for tasks assessed  SENSATION: WFL  POSTURE:  rounded shoulders, forward head, and decreased lumbar lordosis  PALPATION: TTP: cervical paraspinals and bilateral upper traps   CERVICAL ROM:   ROM AROM (deg) Eval 07/14/23  Flexion 40  Extension 8  Right lateral flexion 28 Resting position is 8 tilted to Rt  Left lateral flexion 12, c pain  Right rotation 34  Left rotation 36   (Blank rows = not tested)  UPPER EXTREMITY ROM:   ROM  Right Eval 07/14/23 Left Eval 07/14/23  Shoulder flexion 160 154  Shoulder extension 54 56  Shoulder abduction 155 155  Shoulder adduction    Shoulder extension    Shoulder internal rotation    Shoulder  external rotation    Elbow flexion    Elbow extension    Wrist flexion    Wrist extension    Wrist ulnar deviation    Wrist radial deviation    Wrist pronation    Wrist supination     (Blank rows = not tested)  UPPER EXTREMITY MMT:  MMT Right Eval 07/14/23 Left eval  Shoulder flexion 3.6 ppsi 4.4 ppsi  Shoulder extension    Shoulder abduction    Shoulder adduction    Shoulder extension    Shoulder internal rotation    Shoulder external rotation    Middle trapezius    Lower trapezius    Elbow flexion    Elbow extension    Wrist flexion    Wrist extension    Wrist ulnar deviation    Wrist radial deviation    Wrist pronation    Wrist supination    Grip strength     (Blank rows = not tested)  FUNCTIONAL TESTS:  07/14/23 Timed up and go (TUG): 17.41, c UE support                                                                                                                                                                                 TODAY'S TREATMENT:                                                                                                       DATE:  07/27/23 Therex: UBE: level 1.2 x  3 minutes each direction TherActivites:  Rows: x 15 slow eccentrics green TB Shoulder extension: x 15 red TB Mechanical Traction:  15# max pull, to 10# minimum pull     TODAY'S TREATMENT:  DATE:  07/20/23 Therex: Nustep: LE/UE x 8 minutes Scapular squeezes x 10 holding 3 sec TherActivites:  Rows: x 15 slow eccentrics green TB Shoulder extension: x 15 red TB Mechanical Traction:  7# pull to 15# pull x 20 minutes    TODAY'S TREATMENT:                                                                                                       DATE:  07/14/23 Therex: HEP instruction/performance c cues for techniques, handout provided.  Trial set performed of each for  comprehension and symptom assessment.  See below for exercise list  PATIENT EDUCATION:  Education details: HEP, POC Person educated: Patient Education method: Explanation, Demonstration, Verbal cues, and Handouts Education comprehension: verbalized understanding, returned demonstration, and verbal cues required  HOME EXERCISE PROGRAM: Access Code: [email or web address] URL: [email or web address] Date: 07/14/2023 Prepared by: Delon Lunger  Exercises - Supine Cervical Retraction with Towel  - 2 x daily - 7 x weekly - 10 reps - 5 seconds hold - Supine Chin Tuck  - 2 x daily - 7 x weekly - 10 reps - 5 seconds hold - Supine Cervical Rotation PROM  - 2 x daily - 7 x weekly - 5 reps - 5 second hold - Seated Upper Trapezius Stretch  - 2 x daily - 7 x weekly - 5 reps - 5 seconds hold  ASSESSMENT:  CLINICAL IMPRESSION: Pt and her daughter reporting good response to traction last visit. Pt wishing to perform it again. Pt tolerating all exercises well. Recommending continued skilled PT.    OBJECTIVE IMPAIRMENTS: decreased balance, impaired flexibility, postural dysfunction, and pain.   ACTIVITY LIMITATIONS: lifting, dressing, and reach over head  PARTICIPATION LIMITATIONS: community activity  PERSONAL FACTORS: see PMH above  are also affecting patient's functional outcome.   REHAB POTENTIAL: Good  CLINICAL DECISION MAKING: Stable/uncomplicated  EVALUATION COMPLEXITY: Low   GOALS: Goals reviewed with patient? Yes  SHORT TERM GOALS: (target date for Short term goals are 3 weeks 08/04/2023)  1.Patient will demonstrate independent use of home exercise program to maintain progress from in clinic treatments. Goal status: On-going   LONG TERM GOALS: (target dates for all long term goals are 8 weeks  09/11/2023)   1. Patient will demonstrate/report pain at worst less than or equal to 2/10 to facilitate minimal limitation in daily activity secondary to pain symptoms. Goal status:  New   2. Patient will demonstrate independent use of home exercise program to facilitate ability to maintain/progress functional gains from skilled physical therapy services. Goal status: New   3. Patient will demonstrate Patient specific functional scale avg > or = 3.5 to indicate reduced disability due to condition.  Goal status: New   4.  Patient will demonstrate cervical extension to >/= 12 degrees s symptoms to facilitate usual head movements for daily activity including driving, self care.   Goal status: New   5.  Pt will improve bilateral cervical rotation to >/= 45 degrees for improvements in functional movements.   Goal status: New     PLAN:  PT  FREQUENCY: 1-2x/week  PT DURATION: 10 weeks  Can include 02853- PT Re-evaluation, 97110-Therapeutic exercises, 97530- Therapeutic activity, [id number]- Neuromuscular re-education, 97535- Self Care, 97140- Manual therapy, [phone]- Gait training, [phone]- Orthotic Fit/training, [phone]- Canalith repositioning, [id number]- Aquatic Therapy, [phone]- Electrical stimulation (unattended), [id number] Physical performance testing, 97016- Vasopneumatic device, [id number]- Ultrasound, [id number]- Traction (mechanical), [id number]- Ionotophoresis 4mg /ml Dexamethasone ,  79439 - Needle insertion w/o injection 1 or 2 muscles, 20561 - Needle insertion w/o injection 3 or more muscles.   Patient/Family education, Balance training, Stair training, Taping, Dry Needling, Joint mobilization, Joint manipulation, Spinal manipulation, Spinal mobilization, Scar mobilization, Vestibular training, Visual/preceptual remediation/compensation, DME instructions, Cryotherapy, and Moist heat.  All performed as medically necessary.  All included unless contraindicated  PLAN FOR NEXT SESSION: cervical ROM, postural exercises as tolerated Response to Traction with increased pull.     Delon JONELLE Lunger, PT, MPT 07/27/2023, 11:17 AM

## 2023-08-03 ENCOUNTER — Ambulatory Visit: Admitting: Physical Therapy

## 2023-08-03 ENCOUNTER — Encounter: Payer: Self-pay | Admitting: Physical Therapy

## 2023-08-03 DIAGNOSIS — M6281 Muscle weakness (generalized): Secondary | ICD-10-CM

## 2023-08-03 DIAGNOSIS — R2681 Unsteadiness on feet: Secondary | ICD-10-CM

## 2023-08-03 DIAGNOSIS — M542 Cervicalgia: Secondary | ICD-10-CM

## 2023-08-03 NOTE — Therapy (Signed)
 OUTPATIENT PHYSICAL THERAPY CERVICAL TREATMENT  Patient Name: Olivia Evans MRN: [id number] DOB:December 29, 1926, 89 y.o., female Today's Date: 08/03/2023  END OF SESSION:  PT End of Session - 08/03/23 1538     Visit Number 4    Number of Visits 20    Date for PT Re-Evaluation 09/25/23    PT Start Time 1515    PT Stop Time 1555    PT Time Calculation (min) 40 min    Activity Tolerance Patient tolerated treatment well    Behavior During Therapy [facility] for tasks assessed/performed          Past Medical History:  Diagnosis Date   Arthritis KNEES   Bladder pain    Chronic constipation    Diverticulosis 2013   Dr Aneita   Dyslipidemia    Heart murmur    Hemorrhoids    History of adenomatous polyp of colon    2003 -- VILLOUS   History of breast cancer 2001---RIGHT BREAST CANCER   S/P PARTIAL MASTECTOMY AND RADIATION--  NO RECURRENCE   History of palpitations    Incomplete right bundle branch block (RBBB)    Interstitial cystitis    Dr McDiarmid   Lower urinary tract symptoms (LUTS)    Normal cardiac stress test    2000  PER MD NOTE   Osteopenia    Wears dentures    full -upper/  partial lower   Wears glasses    Past Surgical History:  Procedure Laterality Date   BENIGN TUMOR REMOVED  1960'S   SECOND RIB   CATARACT EXTRACTION W/ INTRAOCULAR LENS  IMPLANT, BILATERAL  feb 2016   CYSTO WITH HYDRODISTENSION  07/29/2011   Procedure: CYSTOSCOPY/HYDRODISTENSION;  Surgeon: Glendia DELENA Elizabeth, MD;  Location: [facility] [place];  Service: Urology;  Laterality: N/A;  with fulguration of bladder ulcer   CYSTO WITH HYDRODISTENSION N/A 08/28/2014   Procedure: CYSTO/HYDRODISTENSION, FULGURATION OF ULCERS;  Surgeon: Glendia Elizabeth, MD;  Location: [facility] [place];  Service: Urology;  Laterality: N/A;   CYSTO/  HYDRODISTENTION/  INSTILLATION THERAPY  x2  01-18- and 11-15- 2011   CYSTO/ HOD/  BLADDER BX'S/  FULGERATION HUNNER ULCER'S /  INSTILLATION THERAPY  10-01-2010    PARTIAL MASTECTOMY W/ SLN DISSECTION Right 12-19-1999   ROTATOR CUFF REPAIR     TUBAL LIGATION     VAGINAL HYSTERECTOMY  1970'S   WITH APPENDECTOMY   VARICOSE VEIN SURGERY     Patient Active Problem List   Diagnosis Date Noted   Pain and swelling of lower extremity, right 01/16/2022   Murmur 11/06/2021   Myelodysplasia (myelodysplastic syndrome) (HCC) 11/05/2021   Neck pain 10/07/2021   Occipital neuralgia of left side 10/04/2021   Decreased hearing of both ears 03/08/2021   Bicytopenia 10/23/2020   History of breast cancer on right 10/16/2020   Anemia 11/28/2018   Prediabetes 10/13/2018   Bilateral leg edema 09/12/2017   Bilateral primary osteoarthritis of knee 12/22/2016   SOB (shortness of breath) on exertion 10/15/2016   Osteoporosis 03/26/2016   Diverticulosis of colon without hemorrhage 11/15/2013   Vitamin D  deficiency 06/30/2012   INTERSTITIAL CYSTITIS 10/09/2009   Constipation 12/13/2007   History of colonic polyps 12/13/2007   SYNCOPE 06/24/2006    PCP: Geofm Glade PARAS, MD   REFERRING PROVIDER: Geofm Glade PARAS, MD   REFERRING DIAG:  Diagnosis  M54.2,G89.29 (ICD-10-CM) - Chronic neck pain    THERAPY DIAG:  Cervicalgia  Unsteadiness on feet  Muscle weakness (generalized)  Rationale for Evaluation  and Treatment: Rehabilitation  ONSET DATE: Years  SUBJECTIVE:                                                                                                                                                                                                         SUBJECTIVE STATEMENT: Pt arriving today reporting pain more with turning her head to the left.   PERTINENT HISTORY:  See PMH  PAIN:  NPRS scale: 7-8/10 pain with left head turns, 5-6/10 with Rt head turns Pain location: posterior neck Pain description: achy Aggravating factors: extension, rotation Relieving factors: resting  PRECAUTIONS: Other: h/o breast cancer    WEIGHT BEARING  RESTRICTIONS: No  FALLS:  Has patient fallen in last 6 months? No  LIVING ENVIRONMENT: Lives with: lives with their family and lives alone Lives in: House/apartment Stairs: Yes: External: 3 steps; on right going up Has following equipment at home: Single point cane and Grab bars  OCCUPATION: retired  PLOF: Independent  PATIENT GOALS: Be able to move my neck without pain  Next MD visit:  OBJECTIVE:   DIAGNOSTIC FINDINGS:  2023:  2 view radiographs of her cervical spine demonstrate overall  well-maintained alignment.  She does have degenerative changes especially  at C4-5 C5-6 C6-7.  Do not see any acute fractures or other osseous  injuries   PATIENT SURVEYS: Patient-Specific Activity Scoring Scheme  0 represents "unable to perform." 10 represents "able to perform at prior level. 0 1 2 3 4 5 6 7 8 9  10 (Date and Score)   Activity Eval  07/14/23 08/03/23   1. Looking up  1 1   2. Turning head 2  4  3.     4.    5.    Score 1.5 2.5   Total score = sum of the activity scores/number of activities Minimum detectable change (90%CI) for average score = 2 points Minimum detectable change (90%CI) for single activity score = 3 points   COGNITION: Overall cognitive status: Within functional limits for tasks assessed  SENSATION: WFL  POSTURE:  rounded shoulders, forward head, and decreased lumbar lordosis  PALPATION: TTP: cervical paraspinals and bilateral upper traps   CERVICAL ROM:   ROM AROM (deg) Eval 07/14/23  Flexion 40  Extension 8  Right lateral flexion 28 Resting position is 8 tilted to Rt  Left lateral flexion 12, c pain  Right rotation 34  Left rotation 36   (Blank rows = not tested)  UPPER EXTREMITY ROM:   ROM Right Eval 07/14/23 Left Eval 07/14/23  Shoulder flexion 160 154  Shoulder extension 54 56  Shoulder abduction 155 155  Shoulder adduction    Shoulder extension    Shoulder internal rotation    Shoulder external rotation     Elbow flexion    Elbow extension    Wrist flexion    Wrist extension    Wrist ulnar deviation    Wrist radial deviation    Wrist pronation    Wrist supination     (Blank rows = not tested)  UPPER EXTREMITY MMT:  MMT Right Eval 07/14/23 Left eval  Shoulder flexion 3.6 ppsi 4.4 ppsi  Shoulder extension    Shoulder abduction    Shoulder adduction    Shoulder extension    Shoulder internal rotation    Shoulder external rotation    Middle trapezius    Lower trapezius    Elbow flexion    Elbow extension    Wrist flexion    Wrist extension    Wrist ulnar deviation    Wrist radial deviation    Wrist pronation    Wrist supination    Grip strength     (Blank rows = not tested)  FUNCTIONAL TESTS:  07/14/23 Timed up and go (TUG): 17.41, c UE support                                                                                                                                                                                TODAY'S TREATMENT:                                                                                                       DATE:  08/03/23 Therex: Nustep: level 5 UE/LE x 6 minutes TherActivites:  Rows: x 15 slow eccentrics green TB Shoulder extension: x 15 red TB Standing shoulder flexion 2 x 10 back against the wall for balance  Shoulder squeezes x 10  Mechanical Traction:  18# max pull, to 12# minimum pull         TODAY'S TREATMENT:  DATE:  07/27/23 Therex: UBE: level 1.2 x  3 minutes each direction TherActivites:  Rows: x 15 slow eccentrics green TB Shoulder extension: x 15 red TB Mechanical Traction:  15# max pull, to 10# minimum pull     TODAY'S TREATMENT:                                                                                                       DATE:  07/20/23 Therex: Nustep: LE/UE x 8 minutes Scapular squeezes x 10 holding  3 sec TherActivites:  Rows: x 15 slow eccentrics green TB Shoulder extension: x 15 red TB Mechanical Traction:  7# pull to 15# pull x 20 minutes      PATIENT EDUCATION:  Education details: HEP, POC Person educated: Patient Education method: Programmer, multimedia, Facilities manager, Verbal cues, and Handouts Education comprehension: verbalized understanding, returned demonstration, and verbal cues required  HOME EXERCISE PROGRAM: Access Code: [email or web address] URL: [email or web address] Date: 07/14/2023 Prepared by: Delon Lunger  Exercises - Supine Cervical Retraction with Towel  - 2 x daily - 7 x weekly - 10 reps - 5 seconds hold - Supine Chin Tuck  - 2 x daily - 7 x weekly - 10 reps - 5 seconds hold - Supine Cervical Rotation PROM  - 2 x daily - 7 x weekly - 5 reps - 5 second hold - Seated Upper Trapezius Stretch  - 2 x daily - 7 x weekly - 5 reps - 5 seconds hold  ASSESSMENT:  CLINICAL IMPRESSION: Pt arriving reporting Saturday was the best day she has had in a long time. Pt arriving today reporting 7/10 pain when turning head to right and 4-5/10 when turning head to left. Pt reporting traction has helped her the most. We discussed a possible home traction unit. Pt's PSFS has improved from 1 to 2.5. Continue skilled PT interventions.    OBJECTIVE IMPAIRMENTS: decreased balance, impaired flexibility, postural dysfunction, and pain.   ACTIVITY LIMITATIONS: lifting, dressing, and reach over head  PARTICIPATION LIMITATIONS: community activity  PERSONAL FACTORS: see PMH above  are also affecting patient's functional outcome.   REHAB POTENTIAL: Good  CLINICAL DECISION MAKING: Stable/uncomplicated  EVALUATION COMPLEXITY: Low   GOALS: Goals reviewed with patient? Yes  SHORT TERM GOALS: (target date for Short term goals are 3 weeks 08/04/2023)  1.Patient will demonstrate independent use of home exercise program to maintain progress from in clinic treatments. Goal status:  On-going   LONG TERM GOALS: (target dates for all long term goals are 8 weeks  09/11/2023)   1. Patient will demonstrate/report pain at worst less than or equal to 2/10 to facilitate minimal limitation in daily activity secondary to pain symptoms. Goal status: On-going 08/03/23   2. Patient will demonstrate independent use of home exercise program to facilitate ability to maintain/progress functional gains from skilled physical therapy services. Goal status: New   3. Patient will demonstrate Patient specific functional scale avg > or = 3.5 to indicate reduced disability due to condition.  Goal status: New   4.  Patient will demonstrate cervical extension to >/= 12 degrees s  symptoms to facilitate usual head movements for daily activity including driving, self care.   Goal status: New   5.  Pt will improve bilateral cervical rotation to >/= 45 degrees for improvements in functional movements.   Goal status: New     PLAN:  PT FREQUENCY: 1-2x/week  PT DURATION: 10 weeks  Can include 02853- PT Re-evaluation, 97110-Therapeutic exercises, 97530- Therapeutic activity, [id number]- Neuromuscular re-education, 97535- Self Care, 97140- Manual therapy, [phone]- Gait training, [id number]- Orthotic Fit/training, [phone]- Canalith repositioning, [id number]- Aquatic Therapy, [phone]- Electrical stimulation (unattended), [id number] Physical performance testing, 97016- Vasopneumatic device, [id number]- Ultrasound, [id number]- Traction (mechanical), [id number]- Ionotophoresis 4mg /ml Dexamethasone ,  79439 - Needle insertion w/o injection 1 or 2 muscles, 20561 - Needle insertion w/o injection 3 or more muscles.   Patient/Family education, Balance training, Stair training, Taping, Dry Needling, Joint mobilization, Joint manipulation, Spinal manipulation, Spinal mobilization, Scar mobilization, Vestibular training, Visual/preceptual remediation/compensation, DME instructions, Cryotherapy, and Moist heat.  All performed as medically necessary.  All  included unless contraindicated  PLAN FOR NEXT SESSION: cervical ROM, postural exercises as tolerated Response to Traction with increased pull as tolerated.    Delon JONELLE Lunger, PT, MPT 08/03/2023, 4:06 PM

## 2023-08-05 ENCOUNTER — Encounter: Payer: Self-pay | Admitting: Physical Therapy

## 2023-08-05 ENCOUNTER — Ambulatory Visit (INDEPENDENT_AMBULATORY_CARE_PROVIDER_SITE_OTHER): Admitting: Physical Therapy

## 2023-08-05 DIAGNOSIS — R2681 Unsteadiness on feet: Secondary | ICD-10-CM | POA: Diagnosis not present

## 2023-08-05 DIAGNOSIS — M6281 Muscle weakness (generalized): Secondary | ICD-10-CM

## 2023-08-05 DIAGNOSIS — M542 Cervicalgia: Secondary | ICD-10-CM

## 2023-08-05 NOTE — Therapy (Signed)
 OUTPATIENT PHYSICAL THERAPY CERVICAL TREATMENT  Patient Name: Olivia Evans MRN: [id number] DOB:1926/07/21, 89 y.o., female Today's Date: 08/05/2023  END OF SESSION:  PT End of Session - 08/05/23 1258     Visit Number 5    Number of Visits 20    Date for PT Re-Evaluation 09/25/23    Authorization Type [employer] $10 copay    Progress Note Due on Visit 10    PT Start Time 1300    PT Stop Time 1344    PT Time Calculation (min) 44 min    Activity Tolerance Patient tolerated treatment well    Behavior During Therapy [facility] for tasks assessed/performed           Past Medical History:  Diagnosis Date   Arthritis KNEES   Bladder pain    Chronic constipation    Diverticulosis 2013   Dr Aneita   Dyslipidemia    Heart murmur    Hemorrhoids    History of adenomatous polyp of colon    2003 -- VILLOUS   History of breast cancer 2001---RIGHT BREAST CANCER   S/P PARTIAL MASTECTOMY AND RADIATION--  NO RECURRENCE   History of palpitations    Incomplete right bundle branch block (RBBB)    Interstitial cystitis    Dr McDiarmid   Lower urinary tract symptoms (LUTS)    Normal cardiac stress test    2000  PER MD NOTE   Osteopenia    Wears dentures    full -upper/  partial lower   Wears glasses    Past Surgical History:  Procedure Laterality Date   BENIGN TUMOR REMOVED  1960'S   SECOND RIB   CATARACT EXTRACTION W/ INTRAOCULAR LENS  IMPLANT, BILATERAL  feb 2016   CYSTO WITH HYDRODISTENSION  07/29/2011   Procedure: CYSTOSCOPY/HYDRODISTENSION;  Surgeon: Glendia DELENA Elizabeth, MD;  Location: [facility] [place];  Service: Urology;  Laterality: N/A;  with fulguration of bladder ulcer   CYSTO WITH HYDRODISTENSION N/A 08/28/2014   Procedure: CYSTO/HYDRODISTENSION, FULGURATION OF ULCERS;  Surgeon: Glendia Elizabeth, MD;  Location: [facility] [place];  Service: Urology;  Laterality: N/A;   CYSTO/  HYDRODISTENTION/  INSTILLATION THERAPY  x2  01-18- and 11-15- 2011   CYSTO/  HOD/  BLADDER BX'S/  FULGERATION HUNNER ULCER'S /  INSTILLATION THERAPY  10-01-2010   PARTIAL MASTECTOMY W/ SLN DISSECTION Right 12-19-1999   ROTATOR CUFF REPAIR     TUBAL LIGATION     VAGINAL HYSTERECTOMY  1970'S   WITH APPENDECTOMY   VARICOSE VEIN SURGERY     Patient Active Problem List   Diagnosis Date Noted   Pain and swelling of lower extremity, right 01/16/2022   Murmur 11/06/2021   Myelodysplasia (myelodysplastic syndrome) (HCC) 11/05/2021   Neck pain 10/07/2021   Occipital neuralgia of left side 10/04/2021   Decreased hearing of both ears 03/08/2021   Bicytopenia 10/23/2020   History of breast cancer on right 10/16/2020   Anemia 11/28/2018   Prediabetes 10/13/2018   Bilateral leg edema 09/12/2017   Bilateral primary osteoarthritis of knee 12/22/2016   SOB (shortness of breath) on exertion 10/15/2016   Osteoporosis 03/26/2016   Diverticulosis of colon without hemorrhage 11/15/2013   Vitamin D  deficiency 06/30/2012   INTERSTITIAL CYSTITIS 10/09/2009   Constipation 12/13/2007   History of colonic polyps 12/13/2007   SYNCOPE 06/24/2006    PCP: Geofm Glade PARAS, MD   REFERRING PROVIDER: Geofm Glade PARAS, MD   REFERRING DIAG:  Diagnosis  M54.2,G89.29 (ICD-10-CM) - Chronic neck pain  THERAPY DIAG:  Cervicalgia  Unsteadiness on feet  Muscle weakness (generalized)  Rationale for Evaluation and Treatment: Rehabilitation  ONSET DATE: Years  SUBJECTIVE:                                                                                                                                                                                                         SUBJECTIVE STATEMENT: Feels a little weak today, but did make sausage balls this balls this morning  PERTINENT HISTORY:  See PMH  PAIN:  NPRS scale: denies pain; just weakness all over Pain location: posterior neck Pain description: achy Aggravating factors: extension, rotation Relieving factors:  resting  PRECAUTIONS: Other: h/o breast cancer    WEIGHT BEARING RESTRICTIONS: No  FALLS:  Has patient fallen in last 6 months? No  LIVING ENVIRONMENT: Lives with: lives with their family and lives alone Lives in: House/apartment Stairs: Yes: External: 3 steps; on right going up Has following equipment at home: Single point cane and Grab bars  OCCUPATION: retired  PLOF: Independent  PATIENT GOALS: Be able to move my neck without pain  Next MD visit:  OBJECTIVE:   DIAGNOSTIC FINDINGS:  2023:  2 view radiographs of her cervical spine demonstrate overall  well-maintained alignment.  She does have degenerative changes especially  at C4-5 C5-6 C6-7.  Do not see any acute fractures or other osseous  injuries   PATIENT SURVEYS: Patient-Specific Activity Scoring Scheme  0 represents "unable to perform." 10 represents "able to perform at prior level. 0 1 2 3 4 5 6 7 8 9  10 (Date and Score)   Activity Eval  07/14/23 08/03/23   1. Looking up  1 1   2. Turning head 2  4  Score 1.5 2.5   Total score = sum of the activity scores/number of activities Minimum detectable change (90%CI) for average score = 2 points Minimum detectable change (90%CI) for single activity score = 3 points   COGNITION: Overall cognitive status: Within functional limits for tasks assessed  SENSATION: WFL  POSTURE:  rounded shoulders, forward head, and decreased lumbar lordosis  PALPATION: TTP: cervical paraspinals and bilateral upper traps   CERVICAL ROM:   ROM AROM (deg) Eval 07/14/23  Flexion 40  Extension 8  Right lateral flexion 28 Resting position is 8 tilted to Rt  Left lateral flexion 12, c pain  Right rotation 34  Left rotation 36   (Blank rows = not tested)  UPPER EXTREMITY ROM:   ROM Right Eval 07/14/23 Left Eval 07/14/23  Shoulder flexion 160 154  Shoulder  extension 54 56  Shoulder abduction 155 155   (Blank rows = not tested)  UPPER EXTREMITY MMT:  MMT  Right Eval 07/14/23 Left eval  Shoulder flexion 3.6 ppsi 4.4 ppsi  Shoulder extension    Shoulder abduction    Shoulder adduction    Shoulder extension    Shoulder internal rotation    Shoulder external rotation    Middle trapezius    Lower trapezius    Elbow flexion    Elbow extension    Wrist flexion    Wrist extension    Wrist ulnar deviation    Wrist radial deviation    Wrist pronation    Wrist supination    Grip strength     (Blank rows = not tested)  FUNCTIONAL TESTS:  07/14/23 Timed up and go (TUG): 17.41, c UE support                                                                                                                                                                                TODAY'S TREATMENT 08/05/23 TherEx: Nustep: level 5 UE/LE x 8 minutes  TherAct Rows 2x10 L3 band; 3 sec hold Standing horizontal abduction (limited range) L3 band 2x10 Standing bicep curls 2x10; 3# DB Standing overhead press 3# x10; 2# x10  Modalities Mechanical cervical traction 18# 60 sec; 12# 20 sec x 10 min    08/03/23 Therex: Nustep: level 5 UE/LE x 6 minutes TherActivites:  Rows: x 15 slow eccentrics green TB Shoulder extension: x 15 red TB Standing shoulder flexion 2 x 10 back against the wall for balance  Shoulder squeezes x 10  Mechanical Traction:  18# max pull, to 12# minimum pull      07/27/23 Therex: UBE: level 1.2 x  3 minutes each direction TherActivites:  Rows: x 15 slow eccentrics green TB Shoulder extension: x 15 red TB Mechanical Traction:  15# max pull, to 10# minimum pull     07/20/23 Therex: Nustep: LE/UE x 8 minutes Scapular squeezes x 10 holding 3 sec TherActivites:  Rows: x 15 slow eccentrics green TB Shoulder extension: x 15 red TB Mechanical Traction:  7# pull to 15# pull x 20 minutes      PATIENT EDUCATION:  Education details: HEP, POC Person educated: Patient Education method: Programmer, multimedia, Facilities manager, Verbal  cues, and Handouts Education comprehension: verbalized understanding, returned demonstration, and verbal cues required  HOME EXERCISE PROGRAM: Access Code: [email or web address] URL: [email or web address] Date: 07/14/2023 Prepared by: Delon Lunger  Exercises - Supine Cervical Retraction with Towel  - 2 x daily - 7 x weekly - 10 reps - 5 seconds hold - Supine Chin Tuck  - 2 x daily - 7 x weekly -  10 reps - 5 seconds hold - Supine Cervical Rotation PROM  - 2 x daily - 7 x weekly - 5 reps - 5 second hold - Seated Upper Trapezius Stretch  - 2 x daily - 7 x weekly - 5 reps - 5 seconds hold  ASSESSMENT:  CLINICAL IMPRESSION: Pt tolerated strengthening exercises well today, does need rest breaks due to fatigue with exercises.  Will continue to benefit from PT to maximize function.   OBJECTIVE IMPAIRMENTS: decreased balance, impaired flexibility, postural dysfunction, and pain.   ACTIVITY LIMITATIONS: lifting, dressing, and reach over head  PARTICIPATION LIMITATIONS: community activity  PERSONAL FACTORS: see PMH above  are also affecting patient's functional outcome.   REHAB POTENTIAL: Good  CLINICAL DECISION MAKING: Stable/uncomplicated  EVALUATION COMPLEXITY: Low   GOALS: Goals reviewed with patient? Yes  SHORT TERM GOALS: (target date for Short term goals are 3 weeks 08/04/2023)  1.Patient will demonstrate independent use of home exercise program to maintain progress from in clinic treatments. Goal status: MET 08/05/23    LONG TERM GOALS: (target dates for all long term goals are 8 weeks  09/11/2023)   1. Patient will demonstrate/report pain at worst less than or equal to 2/10 to facilitate minimal limitation in daily activity secondary to pain symptoms. Goal status: On-going 08/03/23   2. Patient will demonstrate independent use of home exercise program to facilitate ability to maintain/progress functional gains from skilled physical therapy services. Goal status: New    3. Patient will demonstrate Patient specific functional scale avg > or = 3.5 to indicate reduced disability due to condition.  Goal status: New   4.  Patient will demonstrate cervical extension to >/= 12 degrees s symptoms to facilitate usual head movements for daily activity including driving, self care.   Goal status: New   5.  Pt will improve bilateral cervical rotation to >/= 45 degrees for improvements in functional movements.   Goal status: New     PLAN:  PT FREQUENCY: 1-2x/week  PT DURATION: 10 weeks  Can include 02853- PT Re-evaluation, 97110-Therapeutic exercises, 97530- Therapeutic activity, [id number]- Neuromuscular re-education, 97535- Self Care, 97140- Manual therapy, [phone]- Gait training, [phone]- Orthotic Fit/training, [phone]- Canalith repositioning, [id number]- Aquatic Therapy, [phone]- Electrical stimulation (unattended), [id number] Physical performance testing, 97016- Vasopneumatic device, [id number]- Ultrasound, [id number]- Traction (mechanical), [id number]- Ionotophoresis 4mg /ml Dexamethasone ,  79439 - Needle insertion w/o injection 1 or 2 muscles, 20561 - Needle insertion w/o injection 3 or more muscles.   Patient/Family education, Balance training, Stair training, Taping, Dry Needling, Joint mobilization, Joint manipulation, Spinal manipulation, Spinal mobilization, Scar mobilization, Vestibular training, Visual/preceptual remediation/compensation, DME instructions, Cryotherapy, and Moist heat.  All performed as medically necessary.  All included unless contraindicated  PLAN FOR NEXT SESSION: continue with  cervical ROM, postural exercises as tolerated Response to Traction with increased pull as tolerated.     Corean JULIANNA Ku, PT, DPT 08/05/23 1:50 PM

## 2023-08-10 ENCOUNTER — Ambulatory Visit (INDEPENDENT_AMBULATORY_CARE_PROVIDER_SITE_OTHER): Admitting: Physical Therapy

## 2023-08-10 ENCOUNTER — Encounter: Payer: Self-pay | Admitting: Physical Therapy

## 2023-08-10 DIAGNOSIS — R2681 Unsteadiness on feet: Secondary | ICD-10-CM

## 2023-08-10 DIAGNOSIS — M542 Cervicalgia: Secondary | ICD-10-CM

## 2023-08-10 DIAGNOSIS — M6281 Muscle weakness (generalized): Secondary | ICD-10-CM

## 2023-08-10 NOTE — Therapy (Addendum)
 OUTPATIENT PHYSICAL THERAPY CERVICAL TREATMENT  Patient Name: Olivia Evans MRN: [id number] DOB:02-Jul-1926, 89 y.o., female Today's Date: 08/10/2023  END OF SESSION:  PT End of Session - 08/10/23 1521     Visit Number 6    Number of Visits 20    Date for PT Re-Evaluation 09/25/23    Progress Note Due on Visit 10    PT Start Time 1513    PT Stop Time 1555    PT Time Calculation (min) 42 min    Activity Tolerance Patient tolerated treatment well    Behavior During Therapy [name] for tasks assessed/performed           Past Medical History:  Diagnosis Date   Arthritis KNEES   Bladder pain    Chronic constipation    Diverticulosis 2013   Dr Olivia Evans   Dyslipidemia    Heart murmur    Hemorrhoids    History of adenomatous polyp of colon    2003 -- VILLOUS   History of breast cancer 2001---RIGHT BREAST CANCER   S/P PARTIAL MASTECTOMY AND RADIATION--  NO RECURRENCE   History of palpitations    Incomplete right bundle branch block (RBBB)    Interstitial cystitis    Dr Olivia Evans   Lower urinary tract symptoms (LUTS)    Normal cardiac stress test    2000  PER MD NOTE   Osteopenia    Wears dentures    full -upper/  partial lower   Wears glasses    Past Surgical History:  Procedure Laterality Date   BENIGN TUMOR REMOVED  1960'S   SECOND RIB   CATARACT EXTRACTION W/ INTRAOCULAR LENS  IMPLANT, BILATERAL  feb 2016   CYSTO WITH HYDRODISTENSION  07/29/2011   Procedure: CYSTOSCOPY/HYDRODISTENSION;  Surgeon: Olivia DELENA Elizabeth, MD;  Location: [facility] [place];  Service: Urology;  Laterality: N/A;  with fulguration of bladder ulcer   CYSTO WITH HYDRODISTENSION N/A 08/28/2014   Procedure: CYSTO/HYDRODISTENSION, FULGURATION OF ULCERS;  Surgeon: Olivia Elizabeth, MD;  Location: [facility];  Service: Urology;  Laterality: N/A;   CYSTO/  HYDRODISTENTION/  INSTILLATION THERAPY  x2  01-18- and 11-15- 2011   CYSTO/ HOD/  BLADDER BX'S/  FULGERATION HUNNER ULCER'S /   INSTILLATION THERAPY  10-01-2010   PARTIAL MASTECTOMY W/ SLN DISSECTION Right 12-19-1999   ROTATOR CUFF REPAIR     TUBAL LIGATION     VAGINAL HYSTERECTOMY  1970'S   WITH APPENDECTOMY   VARICOSE VEIN SURGERY     Patient Active Problem List   Diagnosis Date Noted   Pain and swelling of lower extremity, right 01/16/2022   Murmur 11/06/2021   Myelodysplasia (myelodysplastic syndrome) (HCC) 11/05/2021   Neck pain 10/07/2021   Occipital neuralgia of left side 10/04/2021   Decreased hearing of both ears 03/08/2021   Bicytopenia 10/23/2020   History of breast cancer on right 10/16/2020   Anemia 11/28/2018   Prediabetes 10/13/2018   Bilateral leg edema 09/12/2017   Bilateral primary osteoarthritis of knee 12/22/2016   SOB (shortness of breath) on exertion 10/15/2016   Osteoporosis 03/26/2016   Diverticulosis of colon without hemorrhage 11/15/2013   Vitamin D  deficiency 06/30/2012   INTERSTITIAL CYSTITIS 10/09/2009   Constipation 12/13/2007   History of colonic polyps 12/13/2007   SYNCOPE 06/24/2006    PCP: Olivia Glade PARAS, MD   REFERRING PROVIDER: Geofm Glade PARAS, MD   REFERRING DIAG:  Diagnosis  M54.2,G89.29 (ICD-10-CM) - Chronic neck pain    THERAPY DIAG:  Cervicalgia  Unsteadiness  on feet  Muscle weakness (generalized)  Rationale for Evaluation and Treatment: Rehabilitation  ONSET DATE: Years  SUBJECTIVE:                                                                                                                                                                                                         SUBJECTIVE STATEMENT: Pt reporting more stiffness in her neck. No pain reported. Pt also reporting difficulty looking up and with overhead reaching.   PERTINENT HISTORY:  See PMH  PAIN:  NPRS scale: Pt reporting stiffness in her neck, no pain Pain location: posterior neck Pain description: achy Aggravating factors: extension, rotation Relieving factors:  resting  PRECAUTIONS: Other: h/o breast cancer    WEIGHT BEARING RESTRICTIONS: No  FALLS:  Has patient fallen in last 6 months? No  LIVING ENVIRONMENT: Lives with: lives with their family and lives alone Lives in: House/apartment Stairs: Yes: External: 3 steps; on right going up Has following equipment at home: Single point cane and Grab bars  OCCUPATION: retired  PLOF: Independent  PATIENT GOALS: Be able to move my neck without pain  Next MD visit:  OBJECTIVE:   DIAGNOSTIC FINDINGS:  2023:  2 view radiographs of her cervical spine demonstrate overall  well-maintained alignment.  She does have degenerative changes especially  at C4-5 C5-6 C6-7.  Do not see any acute fractures or other osseous  injuries   PATIENT SURVEYS: Patient-Specific Activity Scoring Scheme  0 represents "unable to perform." 10 represents "able to perform at prior level. 0 1 2 3 4 5 6 7 8 9  10 (Date and Score)   Activity Eval  07/14/23 08/03/23   1. Looking up  1 1   2. Turning head 2  4  Score 1.5 2.5   Total score = sum of the activity scores/number of activities Minimum detectable change (90%CI) for average score = 2 points Minimum detectable change (90%CI) for single activity score = 3 points   COGNITION: Overall cognitive status: Within functional limits for tasks assessed  SENSATION: WFL  POSTURE:  rounded shoulders, forward head, and decreased lumbar lordosis  PALPATION: TTP: cervical paraspinals and bilateral upper traps   CERVICAL ROM:   ROM AROM (deg) Eval 07/14/23  Flexion 40  Extension 8  Right lateral flexion 28 Resting position is 8 tilted to Rt  Left lateral flexion 12, c pain  Right rotation 34  Left rotation 36   (Blank rows = not tested)  UPPER EXTREMITY ROM:   ROM Right Eval 07/14/23 Left Eval 07/14/23  Shoulder flexion 160  154  Shoulder extension 54 56  Shoulder abduction 155 155   (Blank rows = not tested)  UPPER EXTREMITY MMT:  MMT  Right Eval 07/14/23 Left eval  Shoulder flexion 3.6 ppsi 4.4 ppsi  Shoulder extension    Shoulder abduction    Shoulder adduction    Shoulder extension    Shoulder internal rotation    Shoulder external rotation    Middle trapezius    Lower trapezius    Elbow flexion    Elbow extension    Wrist flexion    Wrist extension    Wrist ulnar deviation    Wrist radial deviation    Wrist pronation    Wrist supination    Grip strength     (Blank rows = not tested)  FUNCTIONAL TESTS:  07/14/23 Timed up and go (TUG): 17.41, c UE support                                                                                                                                                                                TODAY'S TREATMENT 08/10/23 TherEx: UBE: level 1: 3 minutes each direction Cervical retraction standing x 10 holding 3 sec  TherAct Rows 2x10 L3 band; 3 sec hold Standing shoulder flexion c 1 # bar x 15 with back against the wall for balance support Standing chest press c 1# x 10  Modalities Mechanical cervical traction 20# 60 sec; 14# 20 sec x 10 min c head of bed raised approximately 20 degrees. X 17 minutes     08/05/23 TherEx: Nustep: level 5 UE/LE x 8 minutes  TherAct Rows 2x10 L3 band; 3 sec hold Standing horizontal abduction (limited range) L3 band 2x10 Standing bicep curls 2x10; 3# DB Standing overhead press 3# x10; 2# x10  Modalities Mechanical cervical traction 18# 60 sec; 12# 20 sec x 10 min    08/03/23 Therex: Nustep: level 5 UE/LE x 6 minutes TherActivites:  Rows: x 15 slow eccentrics green TB Shoulder extension: x 15 red TB Standing shoulder flexion 2 x 10 back against the wall for balance  Shoulder squeezes x 10  Mechanical Traction:  18# max pull, to 12# minimum pull      PATIENT EDUCATION:  Education details: HEP, POC Person educated: Patient Education method: Programmer, multimedia, Facilities manager, Verbal cues, and Handouts Education  comprehension: verbalized understanding, returned demonstration, and verbal cues required  HOME EXERCISE PROGRAM: Access Code: [email or web address] URL: [email or web address] Date: 07/14/2023 Prepared by: Delon Lunger  Exercises - Supine Cervical Retraction with Towel  - 2 x daily - 7 x weekly - 10 reps - 5 seconds hold - Supine Chin Tuck  - 2 x daily - 7 x weekly - 10 reps -  5 seconds hold - Supine Cervical Rotation PROM  - 2 x daily - 7 x weekly - 5 reps - 5 second hold - Seated Upper Trapezius Stretch  - 2 x daily - 7 x weekly - 5 reps - 5 seconds hold  ASSESSMENT:  CLINICAL IMPRESSION: Pt reporting traction seems to be helping the most. Pt tolerating all other exercises well with increased pull. Recommending continued skilled PT.   OBJECTIVE IMPAIRMENTS: decreased balance, impaired flexibility, postural dysfunction, and pain.   ACTIVITY LIMITATIONS: lifting, dressing, and reach over head  PARTICIPATION LIMITATIONS: community activity  PERSONAL FACTORS: see PMH above  are also affecting patient's functional outcome.   REHAB POTENTIAL: Good  CLINICAL DECISION MAKING: Stable/uncomplicated  EVALUATION COMPLEXITY: Low   GOALS: Goals reviewed with patient? Yes  SHORT TERM GOALS: (target date for Short term goals are 3 weeks 08/04/2023)  1.Patient will demonstrate independent use of home exercise program to maintain progress from in clinic treatments. Goal status: MET 08/05/23    LONG TERM GOALS: (target dates for all long term goals are 8 weeks  09/11/2023)   1. Patient will demonstrate/report pain at worst less than or equal to 2/10 to facilitate minimal limitation in daily activity secondary to pain symptoms. Goal status: On-going 08/03/23   2. Patient will demonstrate independent use of home exercise program to facilitate ability to maintain/progress functional gains from skilled physical therapy services. Goal status: New   3. Patient will demonstrate Patient  specific functional scale avg > or = 3.5 to indicate reduced disability due to condition.  Goal status: New   4.  Patient will demonstrate cervical extension to >/= 12 degrees s symptoms to facilitate usual head movements for daily activity including driving, self care.   Goal status: New   5.  Pt will improve bilateral cervical rotation to >/= 45 degrees for improvements in functional movements.   Goal status: New     PLAN:  PT FREQUENCY: 1-2x/week  PT DURATION: 10 weeks  Can include 02853- PT Re-evaluation, 97110-Therapeutic exercises, 97530- Therapeutic activity, [id number]- Neuromuscular re-education, 97535- Self Care, 97140- Manual therapy, [phone]- Gait training, [phone]- Orthotic Fit/training, [phone]- Canalith repositioning, [id number]- Aquatic Therapy, [id number]- Electrical stimulation (unattended), [id number] Physical performance testing, 97016- Vasopneumatic device, [id number]- Ultrasound, [id number]- Traction (mechanical), [id number]- Ionotophoresis 4mg /ml Dexamethasone ,  79439 - Needle insertion w/o injection 1 or 2 muscles, 20561 - Needle insertion w/o injection 3 or more muscles.   Patient/Family education, Balance training, Stair training, Taping, Dry Needling, Joint mobilization, Joint manipulation, Spinal manipulation, Spinal mobilization, Scar mobilization, Vestibular training, Visual/preceptual remediation/compensation, DME instructions, Cryotherapy, and Moist heat.  All performed as medically necessary.  All included unless contraindicated  PLAN FOR NEXT SESSION: continue with  cervical ROM, postural exercises as tolerated Response to Traction with increased pull as tolerated.     Delon Lunger, PT, MPT 08/10/23 3:59 PM   08/10/23 3:59 PM

## 2023-08-12 ENCOUNTER — Encounter: Admitting: Physical Therapy

## 2023-08-12 NOTE — Therapy (Incomplete)
 OUTPATIENT PHYSICAL THERAPY CERVICAL TREATMENT  Patient Name: Olivia Evans MRN: [id number] DOB:1926-05-31, 89 y.o., female Today's Date: 08/12/2023  END OF SESSION:     Past Medical History:  Diagnosis Date   Arthritis KNEES   Bladder pain    Chronic constipation    Diverticulosis 2013   Dr Aneita   Dyslipidemia    Heart murmur    Hemorrhoids    History of adenomatous polyp of colon    2003 -- VILLOUS   History of breast cancer 2001---RIGHT BREAST CANCER   S/P PARTIAL MASTECTOMY AND RADIATION--  NO RECURRENCE   History of palpitations    Incomplete right bundle branch block (RBBB)    Interstitial cystitis    Dr McDiarmid   Lower urinary tract symptoms (LUTS)    Normal cardiac stress test    2000  PER MD NOTE   Osteopenia    Wears dentures    full -upper/  partial lower   Wears glasses    Past Surgical History:  Procedure Laterality Date   BENIGN TUMOR REMOVED  1960'S   SECOND RIB   CATARACT EXTRACTION W/ INTRAOCULAR LENS  IMPLANT, BILATERAL  feb 2016   CYSTO WITH HYDRODISTENSION  07/29/2011   Procedure: CYSTOSCOPY/HYDRODISTENSION;  Surgeon: Glendia DELENA Elizabeth, MD;  Location: [facility];  Service: Urology;  Laterality: N/A;  with fulguration of bladder ulcer   CYSTO WITH HYDRODISTENSION N/A 08/28/2014   Procedure: CYSTO/HYDRODISTENSION, FULGURATION OF ULCERS;  Surgeon: Glendia Elizabeth, MD;  Location: [facility] [place];  Service: Urology;  Laterality: N/A;   CYSTO/  HYDRODISTENTION/  INSTILLATION THERAPY  x2  01-18- and 11-15- 2011   CYSTO/ HOD/  BLADDER BX'S/  FULGERATION HUNNER ULCER'S /  INSTILLATION THERAPY  10-01-2010   PARTIAL MASTECTOMY W/ SLN DISSECTION Right 12-19-1999   ROTATOR CUFF REPAIR     TUBAL LIGATION     VAGINAL HYSTERECTOMY  1970'S   WITH APPENDECTOMY   VARICOSE VEIN SURGERY     Patient Active Problem List   Diagnosis Date Noted   Pain and swelling of lower extremity, right 01/16/2022   Murmur 11/06/2021    Myelodysplasia (myelodysplastic syndrome) (HCC) 11/05/2021   Neck pain 10/07/2021   Occipital neuralgia of left side 10/04/2021   Decreased hearing of both ears 03/08/2021   Bicytopenia 10/23/2020   History of breast cancer on right 10/16/2020   Anemia 11/28/2018   Prediabetes 10/13/2018   Bilateral leg edema 09/12/2017   Bilateral primary osteoarthritis of knee 12/22/2016   SOB (shortness of breath) on exertion 10/15/2016   Osteoporosis 03/26/2016   Diverticulosis of colon without hemorrhage 11/15/2013   Vitamin D  deficiency 06/30/2012   INTERSTITIAL CYSTITIS 10/09/2009   Constipation 12/13/2007   History of colonic polyps 12/13/2007   SYNCOPE 06/24/2006    PCP: Geofm Glade PARAS, MD   REFERRING PROVIDER: Geofm Glade PARAS, MD   REFERRING DIAG:  Diagnosis  M54.2,G89.29 (ICD-10-CM) - Chronic neck pain    THERAPY DIAG:  No diagnosis found.  Rationale for Evaluation and Treatment: Rehabilitation  ONSET DATE: Years  SUBJECTIVE:  SUBJECTIVE STATEMENT: *** Feels a little weak today, but did make sausage balls this balls this morning  PERTINENT HISTORY:  See PMH  PAIN:  NPRS scale: denies pain; just weakness all over Pain location: posterior neck Pain description: achy Aggravating factors: extension, rotation Relieving factors: resting  PRECAUTIONS: Other: h/o breast cancer    WEIGHT BEARING RESTRICTIONS: No  FALLS:  Has patient fallen in last 6 months? No  LIVING ENVIRONMENT: Lives with: lives with their family and lives alone Lives in: House/apartment Stairs: Yes: External: 3 steps; on right going up Has following equipment at home: Single point cane and Grab bars  OCCUPATION: retired  PLOF: Independent  PATIENT GOALS: Be able to move my neck without  pain  Next MD visit:  OBJECTIVE:   DIAGNOSTIC FINDINGS:  2023:  2 view radiographs of her cervical spine demonstrate overall  well-maintained alignment.  She does have degenerative changes especially  at C4-5 C5-6 C6-7.  Do not see any acute fractures or other osseous  injuries   PATIENT SURVEYS: Patient-Specific Activity Scoring Scheme  0 represents "unable to perform." 10 represents "able to perform at prior level. 0 1 2 3 4 5 6 7 8 9  10 (Date and Score)   Activity Eval  07/14/23 08/03/23   1. Looking up  1 1   2. Turning head 2  4  Score 1.5 2.5   Total score = sum of the activity scores/number of activities Minimum detectable change (90%CI) for average score = 2 points Minimum detectable change (90%CI) for single activity score = 3 points   COGNITION: Overall cognitive status: Within functional limits for tasks assessed  SENSATION: WFL  POSTURE:  rounded shoulders, forward head, and decreased lumbar lordosis  PALPATION: TTP: cervical paraspinals and bilateral upper traps   CERVICAL ROM:   ROM AROM (deg) Eval 07/14/23  Flexion 40  Extension 8  Right lateral flexion 28 Resting position is 8 tilted to Rt  Left lateral flexion 12, c pain  Right rotation 34  Left rotation 36   (Blank rows = not tested)  UPPER EXTREMITY ROM:   ROM Right Eval 07/14/23 Left Eval 07/14/23  Shoulder flexion 160 154  Shoulder extension 54 56  Shoulder abduction 155 155   (Blank rows = not tested)  UPPER EXTREMITY MMT:  MMT Right Eval 07/14/23 Left eval  Shoulder flexion 3.6 ppsi 4.4 ppsi  Shoulder extension    Shoulder abduction    Shoulder adduction    Shoulder extension    Shoulder internal rotation    Shoulder external rotation    Middle trapezius    Lower trapezius    Elbow flexion    Elbow extension    Wrist flexion    Wrist extension    Wrist ulnar deviation    Wrist radial deviation    Wrist pronation    Wrist supination    Grip strength      (Blank rows = not tested)  FUNCTIONAL TESTS:  07/14/23 Timed up and go (TUG): 17.41, c UE support  TODAY'S TREATMENT 08/12/23 ***  08/10/23 TherEx: UBE: level 1: 3 minutes each direction Cervical retraction standing x 10 holding 3 sec  TherAct Rows 2x10 L3 band; 3 sec hold Standing shoulder flexion c 1 # bar x 15 with back against the wall for balance support Standing chest press c 1# x 10  Modalities Mechanical cervical traction 20# 60 sec; 14# 20 sec x 10 min c head of bed raised approximately 20 degrees. X 17 minutes     08/05/23 TherEx: Nustep: level 5 UE/LE x 8 minutes  TherAct Rows 2x10 L3 band; 3 sec hold Standing horizontal abduction (limited range) L3 band 2x10 Standing bicep curls 2x10; 3# DB Standing overhead press 3# x10; 2# x10  Modalities Mechanical cervical traction 18# 60 sec; 12# 20 sec x 10 min    08/03/23 Therex: Nustep: level 5 UE/LE x 6 minutes TherActivites:  Rows: x 15 slow eccentrics green TB Shoulder extension: x 15 red TB Standing shoulder flexion 2 x 10 back against the wall for balance  Shoulder squeezes x 10  Mechanical Traction:  18# max pull, to 12# minimum pull      PATIENT EDUCATION:  Education details: HEP, POC Person educated: Patient Education method: Programmer, multimedia, Facilities manager, Verbal cues, and Handouts Education comprehension: verbalized understanding, returned demonstration, and verbal cues required  HOME EXERCISE PROGRAM: Access Code: [email or web address] URL: [email or web address] Date: 07/14/2023 Prepared by: Delon Lunger  Exercises - Supine Cervical Retraction with Towel  - 2 x daily - 7 x weekly - 10 reps - 5 seconds hold - Supine Chin Tuck  - 2 x daily - 7 x weekly - 10 reps - 5 seconds hold - Supine Cervical Rotation PROM  - 2 x  daily - 7 x weekly - 5 reps - 5 second hold - Seated Upper Trapezius Stretch  - 2 x daily - 7 x weekly - 5 reps - 5 seconds hold  ASSESSMENT:  CLINICAL IMPRESSION: *** Pt reporting traction seems to be helping the most. Pt tolerating all other exercises well with increased pull. Recommending continued skilled PT.   OBJECTIVE IMPAIRMENTS: decreased balance, impaired flexibility, postural dysfunction, and pain.   ACTIVITY LIMITATIONS: lifting, dressing, and reach over head  PARTICIPATION LIMITATIONS: community activity  PERSONAL FACTORS: see PMH above  are also affecting patient's functional outcome.   REHAB POTENTIAL: Good  CLINICAL DECISION MAKING: Stable/uncomplicated  EVALUATION COMPLEXITY: Low   GOALS: Goals reviewed with patient? Yes  SHORT TERM GOALS: (target date for Short term goals are 3 weeks 08/04/2023)  1.Patient will demonstrate independent use of home exercise program to maintain progress from in clinic treatments. Goal status: MET 08/05/23    LONG TERM GOALS: (target dates for all long term goals are 8 weeks  09/11/2023)   1. Patient will demonstrate/report pain at worst less than or equal to 2/10 to facilitate minimal limitation in daily activity secondary to pain symptoms. Goal status: On-going 08/03/23   2. Patient will demonstrate independent use of home exercise program to facilitate ability to maintain/progress functional gains from skilled physical therapy services. Goal status: New   3. Patient will demonstrate Patient specific functional scale avg > or = 3.5 to indicate reduced disability due to condition.  Goal status: New   4.  Patient will demonstrate cervical extension to >/= 12 degrees s symptoms to facilitate usual head movements for daily activity including driving, self care.   Goal status: New   5.  Pt will improve bilateral cervical rotation to >/= 45 degrees for  improvements in functional movements.   Goal status: New     PLAN:  PT  FREQUENCY: 1-2x/week  PT DURATION: 10 weeks  Can include 02853- PT Re-evaluation, 97110-Therapeutic exercises, 97530- Therapeutic activity, [id number]- Neuromuscular re-education, 97535- Self Care, 97140- Manual therapy, [phone]- Gait training, [phone]- Orthotic Fit/training, [phone]- Canalith repositioning, [id number]- Aquatic Therapy, [id number]- Electrical stimulation (unattended), [id number] Physical performance testing, 97016- Vasopneumatic device, [id number]- Ultrasound, [id number]- Traction (mechanical), [id number]- Ionotophoresis 4mg /ml Dexamethasone ,  79439 - Needle insertion w/o injection 1 or 2 muscles, 20561 - Needle insertion w/o injection 3 or more muscles.   Patient/Family education, Balance training, Stair training, Taping, Dry Needling, Joint mobilization, Joint manipulation, Spinal manipulation, Spinal mobilization, Scar mobilization, Vestibular training, Visual/preceptual remediation/compensation, DME instructions, Cryotherapy, and Moist heat.  All performed as medically necessary.  All included unless contraindicated  PLAN FOR NEXT SESSION: *** continue with  cervical ROM, postural exercises as tolerated Response to Traction with increased pull as tolerated.      Corean JULIANNA Ku, PT, DPT 08/12/23 7:55 AM

## 2023-08-17 ENCOUNTER — Encounter: Admitting: Physical Therapy

## 2023-08-17 ENCOUNTER — Telehealth: Payer: Self-pay | Admitting: Physical Therapy

## 2023-08-17 NOTE — Telephone Encounter (Signed)
 I called pt's daughter Consuelo and left a message regarding the status of our broken traction machine to see if pt wanted to attend therapy or be transferred to a different clinic. I also called and spoke to pt's other daughter Monta Fontana and explained our situation. She said to cancel her mom's appointment this afternoon and she would call back to confirm where she wishes to wait until our machine is fixed or transfer to another clinic.  Delon Lunger, PT, MPT 08/17/23 8:31 AM

## 2023-08-19 ENCOUNTER — Encounter: Admitting: Physical Therapy

## 2023-08-19 ENCOUNTER — Ambulatory Visit (INDEPENDENT_AMBULATORY_CARE_PROVIDER_SITE_OTHER): Admitting: Physical Therapy

## 2023-08-19 DIAGNOSIS — M542 Cervicalgia: Secondary | ICD-10-CM

## 2023-08-19 DIAGNOSIS — R2681 Unsteadiness on feet: Secondary | ICD-10-CM

## 2023-08-19 DIAGNOSIS — M6281 Muscle weakness (generalized): Secondary | ICD-10-CM

## 2023-08-19 NOTE — Therapy (Signed)
 OUTPATIENT PHYSICAL THERAPY CERVICAL TREATMENT  Patient Name: Olivia Evans MRN: [id number] DOB:03-28-1926, 89 y.o., female Today's Date: 08/19/2023  END OF SESSION:  PT End of Session - 08/19/23 1016     Visit Number 7    Number of Visits 20    Date for PT Re-Evaluation 09/25/23    Progress Note Due on Visit 10    PT Start Time 1016    PT Stop Time 1100    PT Time Calculation (min) 44 min    Activity Tolerance Patient tolerated treatment well    Behavior During Therapy [facility] for tasks assessed/performed            Past Medical History:  Diagnosis Date   Arthritis KNEES   Bladder pain    Chronic constipation    Diverticulosis 2013   Dr Aneita   Dyslipidemia    Heart murmur    Hemorrhoids    History of adenomatous polyp of colon    2003 -- VILLOUS   History of breast cancer 2001---RIGHT BREAST CANCER   S/P PARTIAL MASTECTOMY AND RADIATION--  NO RECURRENCE   History of palpitations    Incomplete right bundle branch block (RBBB)    Interstitial cystitis    Dr McDiarmid   Lower urinary tract symptoms (LUTS)    Normal cardiac stress test    2000  PER MD NOTE   Osteopenia    Wears dentures    full -upper/  partial lower   Wears glasses    Past Surgical History:  Procedure Laterality Date   BENIGN TUMOR REMOVED  1960'S   SECOND RIB   CATARACT EXTRACTION W/ INTRAOCULAR LENS  IMPLANT, BILATERAL  feb 2016   CYSTO WITH HYDRODISTENSION  07/29/2011   Procedure: CYSTOSCOPY/HYDRODISTENSION;  Surgeon: Glendia DELENA Elizabeth, MD;  Location: [facility] [place];  Service: Urology;  Laterality: N/A;  with fulguration of bladder ulcer   CYSTO WITH HYDRODISTENSION N/A 08/28/2014   Procedure: CYSTO/HYDRODISTENSION, FULGURATION OF ULCERS;  Surgeon: Glendia Elizabeth, MD;  Location: [facility];  Service: Urology;  Laterality: N/A;   CYSTO/  HYDRODISTENTION/  INSTILLATION THERAPY  x2  01-18- and 11-15- 2011   CYSTO/ HOD/  BLADDER BX'S/  FULGERATION HUNNER ULCER'S /   INSTILLATION THERAPY  10-01-2010   PARTIAL MASTECTOMY W/ SLN DISSECTION Right 12-19-1999   ROTATOR CUFF REPAIR     TUBAL LIGATION     VAGINAL HYSTERECTOMY  1970'S   WITH APPENDECTOMY   VARICOSE VEIN SURGERY     Patient Active Problem List   Diagnosis Date Noted   Pain and swelling of lower extremity, right 01/16/2022   Murmur 11/06/2021   Myelodysplasia (myelodysplastic syndrome) (HCC) 11/05/2021   Neck pain 10/07/2021   Occipital neuralgia of left side 10/04/2021   Decreased hearing of both ears 03/08/2021   Bicytopenia 10/23/2020   History of breast cancer on right 10/16/2020   Anemia 11/28/2018   Prediabetes 10/13/2018   Bilateral leg edema 09/12/2017   Bilateral primary osteoarthritis of knee 12/22/2016   SOB (shortness of breath) on exertion 10/15/2016   Osteoporosis 03/26/2016   Diverticulosis of colon without hemorrhage 11/15/2013   Vitamin D  deficiency 06/30/2012   INTERSTITIAL CYSTITIS 10/09/2009   Constipation 12/13/2007   History of colonic polyps 12/13/2007   SYNCOPE 06/24/2006    PCP: Geofm Glade PARAS, MD   REFERRING PROVIDER: Geofm Glade PARAS, MD   REFERRING DIAG:  Diagnosis  M54.2,G89.29 (ICD-10-CM) - Chronic neck pain    THERAPY DIAG:  Cervicalgia  Unsteadiness on feet  Muscle weakness (generalized)  Rationale for Evaluation and Treatment: Rehabilitation  ONSET DATE: Years  SUBJECTIVE:                                                                                                                                                                                                         SUBJECTIVE STATEMENT: Doing okay today, still having pain in Rt shoulder and neck  PERTINENT HISTORY:  See PMH  PAIN:  NPRS scale: did not rate today Pain location: posterior neck Pain description: achy Aggravating factors: extension, rotation Relieving factors: resting  PRECAUTIONS: Other: h/o breast cancer    WEIGHT BEARING RESTRICTIONS: No  FALLS:   Has patient fallen in last 6 months? No  LIVING ENVIRONMENT: Lives with: lives with their family and lives alone Lives in: House/apartment Stairs: Yes: External: 3 steps; on right going up Has following equipment at home: Single point cane and Grab bars  OCCUPATION: retired  PLOF: Independent  PATIENT GOALS: Be able to move my neck without pain  Next MD visit:  OBJECTIVE:   DIAGNOSTIC FINDINGS:  2023:  2 view radiographs of her cervical spine demonstrate overall  well-maintained alignment.  She does have degenerative changes especially  at C4-5 C5-6 C6-7.  Do not see any acute fractures or other osseous  injuries   PATIENT SURVEYS: Patient-Specific Activity Scoring Scheme  0 represents "unable to perform." 10 represents "able to perform at prior level. 0 1 2 3 4 5 6 7 8 9  10 (Date and Score)   Activity Eval  07/14/23 08/03/23   1. Looking up  1 1   2. Turning head 2  4  Score 1.5 2.5   Total score = sum of the activity scores/number of activities Minimum detectable change (90%CI) for average score = 2 points Minimum detectable change (90%CI) for single activity score = 3 points   COGNITION: Overall cognitive status: Within functional limits for tasks assessed  SENSATION: WFL  POSTURE:  rounded shoulders, forward head, and decreased lumbar lordosis  PALPATION: TTP: cervical paraspinals and bilateral upper traps   CERVICAL ROM:   ROM AROM (deg) Eval 07/14/23  Flexion 40  Extension 8  Right lateral flexion 28 Resting position is 8 tilted to Rt  Left lateral flexion 12, c pain  Right rotation 34  Left rotation 36   (Blank rows = not tested)  UPPER EXTREMITY ROM:   ROM Right Eval 07/14/23 Left Eval 07/14/23  Shoulder flexion 160 154  Shoulder extension 54 56  Shoulder abduction 155 155   (  Blank rows = not tested)  UPPER EXTREMITY MMT:  MMT Right Eval 07/14/23 Left eval  Shoulder flexion 3.6 ppsi 4.4 ppsi  Shoulder extension     Shoulder abduction    Shoulder adduction    Shoulder extension    Shoulder internal rotation    Shoulder external rotation    Middle trapezius    Lower trapezius    Elbow flexion    Elbow extension    Wrist flexion    Wrist extension    Wrist ulnar deviation    Wrist radial deviation    Wrist pronation    Wrist supination    Grip strength     (Blank rows = not tested)  FUNCTIONAL TESTS:  07/14/23 Timed up and go (TUG): 17.41, c UE support                                                                                                                                                                                TODAY'S TREATMENT 08/19/23 TherEx: UBE: level 1: 3 minutes each direction With e-stim: Scapular retraction x 10 reps; 3 sec hold Bil ER with scap retraction x 10 reps; 3 sec hold Seated bicep curls 2x10; 2#  Seated shoulder flexion 2x10 bil; 2# Seated cervical retraction x 10 reps  Modalities Pre-mod to Rt post shoulder x 20 min with intensity to tolerance during exercises (intensity decreased throughout session as pain increased in Rt shoulder)  08/10/23 TherEx: UBE: level 1: 3 minutes each direction Cervical retraction standing x 10 holding 3 sec  TherAct Rows 2x10 L3 band; 3 sec hold Standing shoulder flexion c 1 # bar x 15 with back against the wall for balance support Standing chest press c 1# x 10  Modalities Mechanical cervical traction 20# 60 sec; 14# 20 sec x 10 min c head of bed raised approximately 20 degrees. X 17 minutes     08/05/23 TherEx: Nustep: level 5 UE/LE x 8 minutes  TherAct Rows 2x10 L3 band; 3 sec hold Standing horizontal abduction (limited range) L3 band 2x10 Standing bicep curls 2x10; 3# DB Standing overhead press 3# x10; 2# x10  Modalities Mechanical cervical traction 18# 60 sec; 12# 20 sec x 10 min    08/03/23 Therex: Nustep: level 5 UE/LE x 6 minutes TherActivites:  Rows: x 15 slow eccentrics green TB Shoulder  extension: x 15 red TB Standing shoulder flexion 2 x 10 back against the wall for balance  Shoulder squeezes x 10  Mechanical Traction:  18# max pull, to 12# minimum pull      PATIENT EDUCATION:  Education details: HEP, POC Person educated: Patient Education method: Programmer, multimedia, Facilities manager, Verbal cues, and Handouts Education comprehension: verbalized  understanding, returned demonstration, and verbal cues required  HOME EXERCISE PROGRAM: Access Code: [email or web address] URL: [email or web address] Date: 07/14/2023 Prepared by: Delon Lunger  Exercises - Supine Cervical Retraction with Towel  - 2 x daily - 7 x weekly - 10 reps - 5 seconds hold - Supine Chin Tuck  - 2 x daily - 7 x weekly - 10 reps - 5 seconds hold - Supine Cervical Rotation PROM  - 2 x daily - 7 x weekly - 5 reps - 5 second hold - Seated Upper Trapezius Stretch  - 2 x daily - 7 x weekly - 5 reps - 5 seconds hold  ASSESSMENT:  CLINICAL IMPRESSION: Pt tolerated session fair today with trial of esim (premod) to see if this helps to decrease her pain.  May need to transfer to other clinic for pt to get traction. Continue skilled PT.  OBJECTIVE IMPAIRMENTS: decreased balance, impaired flexibility, postural dysfunction, and pain.   ACTIVITY LIMITATIONS: lifting, dressing, and reach over head  PARTICIPATION LIMITATIONS: community activity  PERSONAL FACTORS: see PMH above  are also affecting patient's functional outcome.   REHAB POTENTIAL: Good  CLINICAL DECISION MAKING: Stable/uncomplicated  EVALUATION COMPLEXITY: Low   GOALS: Goals reviewed with patient? Yes  SHORT TERM GOALS: (target date for Short term goals are 3 weeks 08/04/2023)  1.Patient will demonstrate independent use of home exercise program to maintain progress from in clinic treatments. Goal status: MET 08/05/23    LONG TERM GOALS: (target dates for all long term goals are 8 weeks  09/11/2023)   1. Patient will demonstrate/report pain  at worst less than or equal to 2/10 to facilitate minimal limitation in daily activity secondary to pain symptoms. Goal status: On-going 08/03/23   2. Patient will demonstrate independent use of home exercise program to facilitate ability to maintain/progress functional gains from skilled physical therapy services. Goal status: New   3. Patient will demonstrate Patient specific functional scale avg > or = 3.5 to indicate reduced disability due to condition.  Goal status: New   4.  Patient will demonstrate cervical extension to >/= 12 degrees s symptoms to facilitate usual head movements for daily activity including driving, self care.   Goal status: New   5.  Pt will improve bilateral cervical rotation to >/= 45 degrees for improvements in functional movements.   Goal status: New     PLAN:  PT FREQUENCY: 1-2x/week  PT DURATION: 10 weeks  Can include 02853- PT Re-evaluation, 97110-Therapeutic exercises, 97530- Therapeutic activity, [id number]- Neuromuscular re-education, 97535- Self Care, 97140- Manual therapy, [phone]- Gait training, [phone]- Orthotic Fit/training, [phone]- Canalith repositioning, [id number]- Aquatic Therapy, [phone]- Electrical stimulation (unattended), [id number] Physical performance testing, 97016- Vasopneumatic device, [id number]- Ultrasound, [id number]- Traction (mechanical), [id number]- Ionotophoresis 4mg /ml Dexamethasone ,  79439 - Needle insertion w/o injection 1 or 2 muscles, 20561 - Needle insertion w/o injection 3 or more muscles.   Patient/Family education, Balance training, Stair training, Taping, Dry Needling, Joint mobilization, Joint manipulation, Spinal manipulation, Spinal mobilization, Scar mobilization, Vestibular training, Visual/preceptual remediation/compensation, DME instructions, Cryotherapy, and Moist heat.  All performed as medically necessary.  All included unless contraindicated  PLAN FOR NEXT SESSION: strengthening as able,  continue with cervical ROM, postural exercises as  tolerated Response to Traction with increased pull as tolerated.      Corean JULIANNA Ku, PT, DPT 08/19/23 11:17 AM

## 2023-08-24 ENCOUNTER — Encounter: Admitting: Physical Therapy

## 2023-08-25 ENCOUNTER — Encounter: Payer: Self-pay | Admitting: Physical Therapy

## 2023-08-25 ENCOUNTER — Ambulatory Visit: Attending: Internal Medicine | Admitting: Physical Therapy

## 2023-08-25 ENCOUNTER — Encounter: Admitting: Physical Therapy

## 2023-08-25 DIAGNOSIS — M542 Cervicalgia: Secondary | ICD-10-CM | POA: Insufficient documentation

## 2023-08-25 DIAGNOSIS — M6281 Muscle weakness (generalized): Secondary | ICD-10-CM | POA: Insufficient documentation

## 2023-08-25 DIAGNOSIS — R2681 Unsteadiness on feet: Secondary | ICD-10-CM | POA: Diagnosis not present

## 2023-08-25 NOTE — Therapy (Signed)
 OUTPATIENT PHYSICAL THERAPY CERVICAL TREATMENT  Patient Name: Olivia Evans MRN: [id number] DOB:03/17/1926, 89 y.o., female Today's Date: 08/25/2023  END OF SESSION:  PT End of Session - 08/25/23 1337     Visit Number 8    Number of Visits 20    Date for PT Re-Evaluation 09/25/23    Progress Note Due on Visit 10    PT Start Time 1336   pt arrived late   PT Stop Time 1415    PT Time Calculation (min) 39 min    Activity Tolerance Patient tolerated treatment well    Behavior During Therapy [facility] for tasks assessed/performed            Past Medical History:  Diagnosis Date   Arthritis KNEES   Bladder pain    Chronic constipation    Diverticulosis 2013   Dr Aneita   Dyslipidemia    Heart murmur    Hemorrhoids    History of adenomatous polyp of colon    2003 -- VILLOUS   History of breast cancer 2001---RIGHT BREAST CANCER   S/P PARTIAL MASTECTOMY AND RADIATION--  NO RECURRENCE   History of palpitations    Incomplete right bundle branch block (RBBB)    Interstitial cystitis    Dr McDiarmid   Lower urinary tract symptoms (LUTS)    Normal cardiac stress test    2000  PER MD NOTE   Osteopenia    Wears dentures    full -upper/  partial lower   Wears glasses    Past Surgical History:  Procedure Laterality Date   BENIGN TUMOR REMOVED  1960'S   SECOND RIB   CATARACT EXTRACTION W/ INTRAOCULAR LENS  IMPLANT, BILATERAL  feb 2016   CYSTO WITH HYDRODISTENSION  07/29/2011   Procedure: CYSTOSCOPY/HYDRODISTENSION;  Surgeon: Glendia DELENA Elizabeth, MD;  Location: [facility] [place];  Service: Urology;  Laterality: N/A;  with fulguration of bladder ulcer   CYSTO WITH HYDRODISTENSION N/A 08/28/2014   Procedure: CYSTO/HYDRODISTENSION, FULGURATION OF ULCERS;  Surgeon: Glendia Elizabeth, MD;  Location: [facility] [place];  Service: Urology;  Laterality: N/A;   CYSTO/  HYDRODISTENTION/  INSTILLATION THERAPY  x2  01-18- and 11-15- 2011   CYSTO/ HOD/  BLADDER BX'S/  FULGERATION  HUNNER ULCER'S /  INSTILLATION THERAPY  10-01-2010   PARTIAL MASTECTOMY W/ SLN DISSECTION Right 12-19-1999   ROTATOR CUFF REPAIR     TUBAL LIGATION     VAGINAL HYSTERECTOMY  1970'S   WITH APPENDECTOMY   VARICOSE VEIN SURGERY     Patient Active Problem List   Diagnosis Date Noted   Pain and swelling of lower extremity, right 01/16/2022   Murmur 11/06/2021   Myelodysplasia (myelodysplastic syndrome) (HCC) 11/05/2021   Neck pain 10/07/2021   Occipital neuralgia of left side 10/04/2021   Decreased hearing of both ears 03/08/2021   Bicytopenia 10/23/2020   History of breast cancer on right 10/16/2020   Anemia 11/28/2018   Prediabetes 10/13/2018   Bilateral leg edema 09/12/2017   Bilateral primary osteoarthritis of knee 12/22/2016   SOB (shortness of breath) on exertion 10/15/2016   Osteoporosis 03/26/2016   Diverticulosis of colon without hemorrhage 11/15/2013   Vitamin D  deficiency 06/30/2012   INTERSTITIAL CYSTITIS 10/09/2009   Constipation 12/13/2007   History of colonic polyps 12/13/2007   SYNCOPE 06/24/2006    PCP: Geofm Glade PARAS, MD   REFERRING PROVIDER: Geofm Glade PARAS, MD   REFERRING DIAG:  Diagnosis  M54.2,G89.29 (ICD-10-CM) - Chronic neck pain    THERAPY  DIAG:  Cervicalgia  Unsteadiness on feet  Muscle weakness (generalized)  Rationale for Evaluation and Treatment: Rehabilitation  ONSET DATE: Years  SUBJECTIVE:                                                                                                                                                                                                         SUBJECTIVE STATEMENT: Doing okay today, still having pain in Rt shoulder and neck  PERTINENT HISTORY:  See PMH  PAIN:  NPRS scale: did not rate today Pain location: posterior neck Pain description: achy Aggravating factors: extension, rotation Relieving factors: resting  PRECAUTIONS: Other: h/o breast cancer    WEIGHT BEARING  RESTRICTIONS: No  FALLS:  Has patient fallen in last 6 months? No  LIVING ENVIRONMENT: Lives with: lives with their family and lives alone Lives in: House/apartment Stairs: Yes: External: 3 steps; on right going up Has following equipment at home: Single point cane and Grab bars  OCCUPATION: retired  PLOF: Independent  PATIENT GOALS: Be able to move my neck without pain  Next MD visit:  OBJECTIVE:   DIAGNOSTIC FINDINGS:  2023:  2 view radiographs of her cervical spine demonstrate overall  well-maintained alignment.  She does have degenerative changes especially  at C4-5 C5-6 C6-7.  Do not see any acute fractures or other osseous  injuries   PATIENT SURVEYS: Patient-Specific Activity Scoring Scheme  0 represents "unable to perform." 10 represents "able to perform at prior level. 0 1 2 3 4 5 6 7 8 9  10 (Date and Score)   Activity Eval  07/14/23 08/03/23   1. Looking up  1 1   2. Turning head 2  4  Score 1.5 2.5   Total score = sum of the activity scores/number of activities Minimum detectable change (90%CI) for average score = 2 points Minimum detectable change (90%CI) for single activity score = 3 points   COGNITION: Overall cognitive status: Within functional limits for tasks assessed  SENSATION: WFL  POSTURE:  rounded shoulders, forward head, and decreased lumbar lordosis  PALPATION: TTP: cervical paraspinals and bilateral upper traps   CERVICAL ROM:   ROM AROM (deg) Eval 07/14/23  Flexion 40  Extension 8  Right lateral flexion 28 Resting position is 8 tilted to Rt  Left lateral flexion 12, c pain  Right rotation 34  Left rotation 36   (Blank rows = not tested)  UPPER EXTREMITY ROM:   ROM Right Eval 07/14/23 Left Eval 07/14/23  Shoulder flexion 160 154  Shoulder extension 54 56  Shoulder abduction  155 155   (Blank rows = not tested)  UPPER EXTREMITY MMT:  MMT Right Eval 07/14/23 Left eval  Shoulder flexion 3.6 ppsi 4.4 ppsi   Shoulder extension    Shoulder abduction    Shoulder adduction    Shoulder extension    Shoulder internal rotation    Shoulder external rotation    Middle trapezius    Lower trapezius    Elbow flexion    Elbow extension    Wrist flexion    Wrist extension    Wrist ulnar deviation    Wrist radial deviation    Wrist pronation    Wrist supination    Grip strength     (Blank rows = not tested)  FUNCTIONAL TESTS:  07/14/23 Timed up and go (TUG): 17.41, c UE support                                                                                                                                                                                TODAY'S TREATMENT  08/25/23 TherEx: UBE 2'/2' fwd and backward for warm up while taking subjective Rows 2x10 GTB - 3 sec hold Seated 90 90 OH press - 2# Standing bicep curls 2x10; 3# DB Bil ER - RTB  Modalities Mechanical cervical traction stepped increase to 18# x 10 min MHP applied in sitting for 10 min following visit   08/19/23 TherEx: UBE: level 1: 3 minutes each direction With e-stim: Scapular retraction x 10 reps; 3 sec hold Bil ER with scap retraction x 10 reps; 3 sec hold Seated bicep curls 2x10; 2#  Seated shoulder flexion 2x10 bil; 2# Seated cervical retraction x 10 reps  Modalities Pre-mod to Rt post shoulder x 20 min with intensity to tolerance during exercises (intensity decreased throughout session as pain increased in Rt shoulder)  08/10/23 TherEx: UBE: level 1: 3 minutes each direction Cervical retraction standing x 10 holding 3 sec  TherAct Rows 2x10 L3 band; 3 sec hold Standing shoulder flexion c 1 # bar x 15 with back against the wall for balance support Standing chest press c 1# x 10  Modalities Mechanical cervical traction 20# 60 sec; 14# 20 sec x 10 min c head of bed raised approximately 20 degrees. X 17 minutes     08/05/23 TherEx: Nustep: level 5 UE/LE x 8 minutes  TherAct Rows 2x10 L3 band; 3  sec hold Standing horizontal abduction (limited range) L3 band 2x10 Standing bicep curls 2x10; 3# DB Standing overhead press 3# x10; 2# x10  Modalities Mechanical cervical traction 18# 60 sec; 12# 20 sec x 10 min    08/03/23 Therex: Nustep: level 5 UE/LE x 6 minutes TherActivites:  Rows: x  15 slow eccentrics green TB Shoulder extension: x 15 red TB Standing shoulder flexion 2 x 10 back against the wall for balance  Shoulder squeezes x 10  Mechanical Traction:  18# max pull, to 12# minimum pull      PATIENT EDUCATION:  Education details: HEP, POC Person educated: Patient Education method: Programmer, multimedia, Facilities manager, Verbal cues, and Handouts Education comprehension: verbalized understanding, returned demonstration, and verbal cues required  HOME EXERCISE PROGRAM: Access Code: [email or web address] URL: [email or web address] Date: 07/14/2023 Prepared by: Delon Lunger  Exercises - Supine Cervical Retraction with Towel  - 2 x daily - 7 x weekly - 10 reps - 5 seconds hold - Supine Chin Tuck  - 2 x daily - 7 x weekly - 10 reps - 5 seconds hold - Supine Cervical Rotation PROM  - 2 x daily - 7 x weekly - 5 reps - 5 second hold - Seated Upper Trapezius Stretch  - 2 x daily - 7 x weekly - 5 reps - 5 seconds hold  ASSESSMENT:  CLINICAL IMPRESSION: Iyanna tolerated session well with no adverse reaction.  Continued with periscapular and R/C strengthening with heavy cuing to maintain posture and avoid R shoulder hike during ER.  Some transient increase in pain with R/C strengthening which returns to baseline quickly with rest.  Pt mainly locates pain to R UT.  Pt reports improvement in pain with traction.  Requests TENS following treatment next visit.    OBJECTIVE IMPAIRMENTS: decreased balance, impaired flexibility, postural dysfunction, and pain.   ACTIVITY LIMITATIONS: lifting, dressing, and reach over head  PARTICIPATION LIMITATIONS: community activity  PERSONAL FACTORS:  see PMH above  are also affecting patient's functional outcome.   REHAB POTENTIAL: Good  CLINICAL DECISION MAKING: Stable/uncomplicated  EVALUATION COMPLEXITY: Low   GOALS: Goals reviewed with patient? Yes  SHORT TERM GOALS: (target date for Short term goals are 3 weeks 08/04/2023)  1.Patient will demonstrate independent use of home exercise program to maintain progress from in clinic treatments. Goal status: MET 08/05/23    LONG TERM GOALS: (target dates for all long term goals are 8 weeks  09/11/2023)   1. Patient will demonstrate/report pain at worst less than or equal to 2/10 to facilitate minimal limitation in daily activity secondary to pain symptoms. Goal status: On-going 08/03/23   2. Patient will demonstrate independent use of home exercise program to facilitate ability to maintain/progress functional gains from skilled physical therapy services. Goal status: New   3. Patient will demonstrate Patient specific functional scale avg > or = 3.5 to indicate reduced disability due to condition.  Goal status: New   4.  Patient will demonstrate cervical extension to >/= 12 degrees s symptoms to facilitate usual head movements for daily activity including driving, self care.   Goal status: New   5.  Pt will improve bilateral cervical rotation to >/= 45 degrees for improvements in functional movements.   Goal status: New     PLAN:  PT FREQUENCY: 1-2x/week  PT DURATION: 10 weeks  Can include 02853- PT Re-evaluation, 97110-Therapeutic exercises, 97530- Therapeutic activity, [id number]- Neuromuscular re-education, 97535- Self Care, 97140- Manual therapy, [phone]- Gait training, [phone]- Orthotic Fit/training, [phone]- Canalith repositioning, [id number]- Aquatic Therapy, [phone]- Electrical stimulation (unattended), [id number] Physical performance testing, 97016- Vasopneumatic device, [id number]- Ultrasound, [id number]- Traction (mechanical), [id number]- Ionotophoresis 4mg /ml Dexamethasone ,  79439 - Needle insertion w/o  injection 1 or 2 muscles, 20561 - Needle insertion w/o injection 3 or more muscles.   Patient/Family education, Balance training, Stair training, Taping, Dry Needling,  Joint mobilization, Joint manipulation, Spinal manipulation, Spinal mobilization, Scar mobilization, Vestibular training, Visual/preceptual remediation/compensation, DME instructions, Cryotherapy, and Moist heat.  All performed as medically necessary.  All included unless contraindicated  PLAN FOR NEXT SESSION: strengthening as able,  continue with cervical ROM, postural exercises as tolerated Response to Traction with increased pull as tolerated.      Anjeanette Petzold E Grigor Lipschutz PT 08/25/23 1:38 PM

## 2023-08-26 ENCOUNTER — Encounter: Admitting: Physical Therapy

## 2023-08-31 ENCOUNTER — Inpatient Hospital Stay: Payer: PPO | Admitting: Internal Medicine

## 2023-08-31 ENCOUNTER — Inpatient Hospital Stay: Payer: PPO | Attending: Internal Medicine

## 2023-08-31 VITALS — BP 159/63 | HR 70 | Temp 98.2°F | Resp 16 | Ht 64.0 in | Wt 131.0 lb

## 2023-08-31 DIAGNOSIS — D4620 Refractory anemia with excess of blasts, unspecified: Secondary | ICD-10-CM | POA: Insufficient documentation

## 2023-08-31 DIAGNOSIS — D469 Myelodysplastic syndrome, unspecified: Secondary | ICD-10-CM | POA: Diagnosis not present

## 2023-08-31 LAB — CBC WITH DIFFERENTIAL (CANCER CENTER ONLY)
Abs Immature Granulocytes: 0.01 K/uL (ref 0.00–0.07)
Basophils Absolute: 0.1 K/uL (ref 0.0–0.1)
Basophils Relative: 2 %
Eosinophils Absolute: 0.2 K/uL (ref 0.0–0.5)
Eosinophils Relative: 5 %
HCT: 24.7 % — ABNORMAL LOW (ref 36.0–46.0)
Hemoglobin: 8.1 g/dL — ABNORMAL LOW (ref 12.0–15.0)
Immature Granulocytes: 0 %
Lymphocytes Relative: 38 %
Lymphs Abs: 1.1 K/uL (ref 0.7–4.0)
MCH: 36.8 pg — ABNORMAL HIGH (ref 26.0–34.0)
MCHC: 32.8 g/dL (ref 30.0–36.0)
MCV: 112.3 fL — ABNORMAL HIGH (ref 80.0–100.0)
Monocytes Absolute: 0.2 K/uL (ref 0.1–1.0)
Monocytes Relative: 8 %
Neutro Abs: 1.4 K/uL — ABNORMAL LOW (ref 1.7–7.7)
Neutrophils Relative %: 47 %
Platelet Count: 230 K/uL (ref 150–400)
RBC: 2.2 MIL/uL — ABNORMAL LOW (ref 3.87–5.11)
RDW: 14.7 % (ref 11.5–15.5)
WBC Count: 2.9 K/uL — ABNORMAL LOW (ref 4.0–10.5)
nRBC: 0 % (ref 0.0–0.2)

## 2023-08-31 LAB — FERRITIN: Ferritin: 621 ng/mL — ABNORMAL HIGH (ref 11–307)

## 2023-08-31 LAB — IRON AND IRON BINDING CAPACITY (CC-WL,HP ONLY)
Iron: 106 ug/dL (ref 28–170)
Saturation Ratios: 46 % — ABNORMAL HIGH (ref 10.4–31.8)
TIBC: 231 ug/dL — ABNORMAL LOW (ref 250–450)
UIBC: 125 ug/dL — ABNORMAL LOW (ref 148–442)

## 2023-08-31 NOTE — Progress Notes (Signed)
 [facility] [phone]   [phone]  OFFICE PROGRESS NOTE  Geofm Glade PARAS, MD [address] [place] [address]  DIAGNOSIS: Low-grade myelodysplastic syndrome with ringed sideroblasts.  PRIOR THERAPY: None  CURRENT THERAPY: Observation  INTERVAL HISTORY: Olivia Evans 89 y.o. female returns to the clinic today for 36-month follow-up visit accompanied by her daughter Consuelo.  Discussed the use of AI scribe software for clinical note transcription with the patient, who gave verbal consent to proceed.  History of Present Illness Olivia Evans is a 89 year old female with low grade myelodysplastic syndrome who presents for evaluation and repeat blood work. She is accompanied by Consuelo.  She has a history of low grade myelodysplastic syndrome with ringed cytoplasm, diagnosed in November 2022. Her hemoglobin levels have decreased from 9.1 six months ago to 8.1 today, and her white blood cell count has decreased from 5.0 to 2.9.  She experiences fatigue, feeling 'wore out' after simple tasks like making coffee or washing dishes. She describes her fatigue as consistent with her age. She has occasional shortness of breath but denies significant chest pain, bleeding, or bruising. She sometimes staggers into walls but does not report unexplained bruising.  She lives in an assisted living facility and is able to walk and drive a little.       MEDICAL HISTORY: Past Medical History:  Diagnosis Date   Arthritis KNEES   Bladder pain    Chronic constipation    Diverticulosis 2013   Dr Aneita   Dyslipidemia    Heart murmur    Hemorrhoids    History of adenomatous polyp of colon    2003 -- VILLOUS   History of breast cancer 2001---RIGHT BREAST CANCER   S/P PARTIAL MASTECTOMY AND RADIATION--  NO RECURRENCE   History of palpitations    Incomplete right bundle branch block (RBBB)    Interstitial cystitis    Dr McDiarmid   Lower urinary  tract symptoms (LUTS)    Normal cardiac stress test    2000  PER MD NOTE   Osteopenia    Wears dentures    full -upper/  partial lower   Wears glasses     ALLERGIES:  is allergic to premarin [conjugated estrogens], vicodin [hydrocodone -acetaminophen ], adhesive [tape], fluvastatin sodium, and simvastatin.  MEDICATIONS:  Current Outpatient Medications  Medication Sig Dispense Refill   acetaminophen  (TYLENOL ) 325 MG tablet Take 325 mg by mouth every 8 (eight) hours as needed for moderate pain.     Apoaequorin (PREVAGEN PO) Take by mouth.     B Complex Vitamins (B COMPLEX-B12 PO) Take by mouth.     Cholecalciferol (VITAMIN D3) 1000 UNITS CAPS Take 4,000 Units by mouth daily.     Ferrous Sulfate (IRON PO) Take 1 tablet by mouth 2 (two) times daily. Patient takes Mega-Food Blood Builder Iron Minis     Mag Aspart-Potassium Aspart 90-90 MG CAPS Take by mouth.     Multiple Vitamins-Minerals (PRESERVISION AREDS 2 PO) Take 1 capsule by mouth in the morning and at bedtime.     OVER THE COUNTER MEDICATION Take 1 tablet by mouth at bedtime as needed (laxitive). PruneLax     Polyethyl Glycol-Propyl Glycol (SYSTANE OP) Place 1 drop into both eyes daily. To both eyes.     torsemide  (DEMADEX ) 20 MG tablet TAKE 1 TABLET BY MOUTH EVERY DAY 90 tablet 1   No current facility-administered medications for this visit.    SURGICAL HISTORY:  Past Surgical History:  Procedure Laterality Date   BENIGN TUMOR REMOVED  1960'S   SECOND RIB   CATARACT EXTRACTION W/ INTRAOCULAR LENS  IMPLANT, BILATERAL  feb 2016   CYSTO WITH HYDRODISTENSION  07/29/2011   Procedure: CYSTOSCOPY/HYDRODISTENSION;  Surgeon: Glendia DELENA Elizabeth, MD;  Location: [facility];  Service: Urology;  Laterality: N/A;  with fulguration of bladder ulcer   CYSTO WITH HYDRODISTENSION N/A 08/28/2014   Procedure: CYSTO/HYDRODISTENSION, FULGURATION OF ULCERS;  Surgeon: Glendia Elizabeth, MD;  Location: [facility];  Service:  Urology;  Laterality: N/A;   CYSTO/  HYDRODISTENTION/  INSTILLATION THERAPY  x2  01-18- and 11-15- 2011   CYSTO/ HOD/  BLADDER BX'S/  FULGERATION HUNNER ULCER'S /  INSTILLATION THERAPY  10-01-2010   PARTIAL MASTECTOMY W/ SLN DISSECTION Right 12-19-1999   ROTATOR CUFF REPAIR     TUBAL LIGATION     VAGINAL HYSTERECTOMY  1970'S   WITH APPENDECTOMY   VARICOSE VEIN SURGERY      REVIEW OF SYSTEMS:  A comprehensive review of systems was negative except for: Constitutional: positive for fatigue Respiratory: positive for dyspnea on exertion   PHYSICAL EXAMINATION: General appearance: alert, cooperative, fatigued, and no distress Head: Normocephalic, without obvious abnormality, atraumatic Neck: no adenopathy, no JVD, supple, symmetrical, trachea midline, and thyroid  not enlarged, symmetric, no tenderness/mass/nodules Lymph nodes: Cervical, supraclavicular, and axillary nodes normal. Resp: clear to auscultation bilaterally Back: symmetric, no curvature. ROM normal. No CVA tenderness. Cardio: regular rate and rhythm, S1, S2 normal, no murmur, click, rub or gallop GI: soft, non-tender; bowel sounds normal; no masses,  no organomegaly Extremities: extremities normal, atraumatic, no cyanosis or edema  ECOG PERFORMANCE STATUS: 1 - Symptomatic but completely ambulatory  Blood pressure (!) 159/63, pulse 70, temperature 98.2 F (36.8 C), resp. rate 16, height 5' 4 (1.626 m), weight 131 lb (59.4 kg), SpO2 100%.  LABORATORY DATA: Lab Results  Component Value Date   WBC 5.0 03/04/2023   HGB 9.1 (L) 03/04/2023   HCT 27.6 (L) 03/04/2023   MCV 111.7 (H) 03/04/2023   PLT 287 03/04/2023      Chemistry      Component Value Date/Time   NA 140 03/04/2023 1342   K 3.7 03/04/2023 1342   CL 101 03/04/2023 1342   CO2 34 (H) 03/04/2023 1342   BUN 17 03/04/2023 1342   CREATININE 0.76 03/04/2023 1342   CREATININE 0.63 10/14/2019 1501      Component Value Date/Time   CALCIUM 9.9 03/04/2023 1342    ALKPHOS 72 03/04/2023 1342   AST 15 03/04/2023 1342   ALT 13 03/04/2023 1342   BILITOT 0.6 03/04/2023 1342       RADIOGRAPHIC STUDIES: No results found.  ASSESSMENT AND PLAN: This is a very pleasant 89 years old white female recently diagnosed with low-grade myelodysplastic syndrome with ring sideroblasts based on a bone marrow biopsy and aspirate as well as her clinical presentation with bicytopenia including anemia and leukocytopenia. Assessment and Plan Assessment & Plan Low grade myelodysplastic syndrome with ring sideroblasts complicated by anemia and leukopenia Low grade myelodysplastic syndrome with ring sideroblasts was diagnosed in November 2022. Recent labs indicate a decrease in hemoglobin from 9.1 to 8.1 and a decrease in white blood cell count from 5.0 to 2.9, while platelet count remains stable. Despite the condition, she is functioning well. Potential treatment with erythropoiesis-stimulating agents was discussed, but due to her age and risks such as hypertension and increased stroke risk, observation will continue. - Continue observation. -  Monitor for significant bleeding, bruising, or increased fatigue. - Consider erythropoiesis-stimulating agents if symptoms worsen.  Fatigue secondary to myelodysplastic syndrome Fatigue is likely secondary to myelodysplastic syndrome. She reports feeling fatigued after minimal activity, such as making coffee or doing dishes, but remains active and functional. - Monitor fatigue levels and assess impact on daily activities. - Re-evaluate if fatigue significantly worsens or impacts quality of life. She was advised to call immediately if she has any concerning symptoms in the interval.    The patient voices understanding of current disease status and treatment options and is in agreement with the current care plan.  All questions were answered. The patient knows to call the clinic with any problems, questions or concerns. We can certainly  see the patient much sooner if necessary.  Disclaimer: This note was dictated with voice recognition software. Similar sounding words can inadvertently be transcribed and may not be corrected upon review.

## 2023-09-01 ENCOUNTER — Ambulatory Visit: Admitting: Physical Therapy

## 2023-09-01 DIAGNOSIS — R2681 Unsteadiness on feet: Secondary | ICD-10-CM

## 2023-09-01 DIAGNOSIS — M6281 Muscle weakness (generalized): Secondary | ICD-10-CM

## 2023-09-01 DIAGNOSIS — M542 Cervicalgia: Secondary | ICD-10-CM | POA: Diagnosis not present

## 2023-09-01 NOTE — Therapy (Signed)
 OUTPATIENT PHYSICAL THERAPY CERVICAL TREATMENT  Patient Name: Olivia Evans MRN: [id number] DOB:Jul 15, 1926, 89 y.o., female Today's Date: 09/01/2023  END OF SESSION:  PT End of Session - 09/01/23 1334     Visit Number 9    Number of Visits 20    Date for PT Re-Evaluation 09/25/23    Authorization Type [employer] $10 copay    Progress Note Due on Visit 10    PT Start Time 1333    PT Stop Time 1421    PT Time Calculation (min) 48 min    Activity Tolerance Patient tolerated treatment well    Behavior During Therapy [facility] for tasks assessed/performed             Past Medical History:  Diagnosis Date   Arthritis KNEES   Bladder pain    Chronic constipation    Diverticulosis 2013   Dr Aneita   Dyslipidemia    Heart murmur    Hemorrhoids    History of adenomatous polyp of colon    2003 -- VILLOUS   History of breast cancer 2001---RIGHT BREAST CANCER   S/P PARTIAL MASTECTOMY AND RADIATION--  NO RECURRENCE   History of palpitations    Incomplete right bundle branch block (RBBB)    Interstitial cystitis    Dr McDiarmid   Lower urinary tract symptoms (LUTS)    Normal cardiac stress test    2000  PER MD NOTE   Osteopenia    Wears dentures    full -upper/  partial lower   Wears glasses    Past Surgical History:  Procedure Laterality Date   BENIGN TUMOR REMOVED  1960'S   SECOND RIB   CATARACT EXTRACTION W/ INTRAOCULAR LENS  IMPLANT, BILATERAL  feb 2016   CYSTO WITH HYDRODISTENSION  07/29/2011   Procedure: CYSTOSCOPY/HYDRODISTENSION;  Surgeon: Glendia DELENA Elizabeth, MD;  Location: [facility];  Service: Urology;  Laterality: N/A;  with fulguration of bladder ulcer   CYSTO WITH HYDRODISTENSION N/A 08/28/2014   Procedure: CYSTO/HYDRODISTENSION, FULGURATION OF ULCERS;  Surgeon: Glendia Elizabeth, MD;  Location: [facility];  Service: Urology;  Laterality: N/A;   CYSTO/  HYDRODISTENTION/  INSTILLATION THERAPY  x2  01-18- and 11-15- 2011    CYSTO/ HOD/  BLADDER BX'S/  FULGERATION HUNNER ULCER'S /  INSTILLATION THERAPY  10-01-2010   PARTIAL MASTECTOMY W/ SLN DISSECTION Right 12-19-1999   ROTATOR CUFF REPAIR     TUBAL LIGATION     VAGINAL HYSTERECTOMY  1970'S   WITH APPENDECTOMY   VARICOSE VEIN SURGERY     Patient Active Problem List   Diagnosis Date Noted   Pain and swelling of lower extremity, right 01/16/2022   Murmur 11/06/2021   Myelodysplasia (myelodysplastic syndrome) (HCC) 11/05/2021   Neck pain 10/07/2021   Occipital neuralgia of left side 10/04/2021   Decreased hearing of both ears 03/08/2021   Bicytopenia 10/23/2020   History of breast cancer on right 10/16/2020   Anemia 11/28/2018   Prediabetes 10/13/2018   Bilateral leg edema 09/12/2017   Bilateral primary osteoarthritis of knee 12/22/2016   SOB (shortness of breath) on exertion 10/15/2016   Osteoporosis 03/26/2016   Diverticulosis of colon without hemorrhage 11/15/2013   Vitamin D  deficiency 06/30/2012   INTERSTITIAL CYSTITIS 10/09/2009   Constipation 12/13/2007   History of colonic polyps 12/13/2007   SYNCOPE 06/24/2006    PCP: Geofm Glade PARAS, MD   REFERRING PROVIDER: Geofm Glade PARAS, MD   REFERRING DIAG:  Diagnosis  M54.2,G89.29 (ICD-10-CM) - Chronic  neck pain    THERAPY DIAG:  Cervicalgia  Unsteadiness on feet  Muscle weakness (generalized)  Rationale for Evaluation and Treatment: Rehabilitation  ONSET DATE: Years  SUBJECTIVE:                                                                                                                                                                                                         SUBJECTIVE STATEMENT: Doing okay today, still having pain in Rt shoulder and neck  PERTINENT HISTORY:  See PMH  PAIN:  NPRS scale: did not rate today Pain location: posterior neck Pain description: achy Aggravating factors: extension, rotation Relieving factors: resting  PRECAUTIONS: Other: h/o breast  cancer    WEIGHT BEARING RESTRICTIONS: No  FALLS:  Has patient fallen in last 6 months? No  LIVING ENVIRONMENT: Lives with: lives with their family and lives alone Lives in: House/apartment Stairs: Yes: External: 3 steps; on right going up Has following equipment at home: Single point cane and Grab bars  OCCUPATION: retired  PLOF: Independent  PATIENT GOALS: Be able to move my neck without pain  Next MD visit:  OBJECTIVE:   DIAGNOSTIC FINDINGS:  2023:  2 view radiographs of her cervical spine demonstrate overall  well-maintained alignment.  She does have degenerative changes especially  at C4-5 C5-6 C6-7.  Do not see any acute fractures or other osseous  injuries   PATIENT SURVEYS: Patient-Specific Activity Scoring Scheme  0 represents "unable to perform." 10 represents "able to perform at prior level. 0 1 2 3 4 5 6 7 8 9  10 (Date and Score)   Activity Eval  07/14/23 08/03/23   1. Looking up  1 1   2. Turning head 2  4  Score 1.5 2.5   Total score = sum of the activity scores/number of activities Minimum detectable change (90%CI) for average score = 2 points Minimum detectable change (90%CI) for single activity score = 3 points   COGNITION: Overall cognitive status: Within functional limits for tasks assessed  SENSATION: WFL  POSTURE:  rounded shoulders, forward head, and decreased lumbar lordosis  PALPATION: TTP: cervical paraspinals and bilateral upper traps   CERVICAL ROM:   ROM AROM (deg) Eval 07/14/23  Flexion 40  Extension 8  Right lateral flexion 28 Resting position is 8 tilted to Rt  Left lateral flexion 12, c pain  Right rotation 34  Left rotation 36   (Blank rows = not tested)  UPPER EXTREMITY ROM:   ROM Right Eval 07/14/23 Left Eval 07/14/23  Shoulder flexion 160 154  Shoulder  extension 54 56  Shoulder abduction 155 155   (Blank rows = not tested)  UPPER EXTREMITY MMT:  MMT Right Eval 07/14/23 Left eval  Shoulder  flexion 3.6 ppsi 4.4 ppsi  Shoulder extension    Shoulder abduction    Shoulder adduction    Shoulder extension    Shoulder internal rotation    Shoulder external rotation    Middle trapezius    Lower trapezius    Elbow flexion    Elbow extension    Wrist flexion    Wrist extension    Wrist ulnar deviation    Wrist radial deviation    Wrist pronation    Wrist supination    Grip strength     (Blank rows = not tested)  FUNCTIONAL TESTS:  07/14/23 Timed up and go (TUG): 17.41, c UE support                                                                                                                                                                                TODAY'S TREATMENT [facility] Adult PT Treatment:                                                DATE: 09/01/23 UBE L3 x 4 min (FWD/BWD x 2 min each) L upper trap stretch 2 x 30 sec  MTPR of the R upper trap / Levator Supine scapular ER with retraction 2 x 12 Bil scapular protraction in supine 1 x 12 Pushing out on pilates ring with shoulder flexion in supine 1 x 12.  Discuss posture and MTPR techniques at home.  Modalities Mechanical cervical traction stepped increase to 18# x 10 min MHP applied in sitting for 10 min during traction  08/25/23 TherEx: UBE 2'/2' fwd and backward for warm up while taking subjective Rows 2x10 GTB - 3 sec hold Seated 90 90 OH press - 2# Standing bicep curls 2x10; 3# DB Bil ER - RTB  Modalities Mechanical cervical traction stepped increase to 18# x 10 min MHP applied in sitting for 10 min following visit   08/19/23 TherEx: UBE: level 1: 3 minutes each direction With e-stim: Scapular retraction x 10 reps; 3 sec hold Bil ER with scap retraction x 10 reps; 3 sec hold Seated bicep curls 2x10; 2#  Seated shoulder flexion 2x10 bil; 2# Seated cervical retraction x 10 reps  Modalities Pre-mod to Rt post shoulder x 20 min with intensity to tolerance during exercises (intensity decreased  throughout session as pain increased in Rt shoulder)  08/10/23 TherEx: UBE: level  1: 3 minutes each direction Cervical retraction standing x 10 holding 3 sec  TherAct Rows 2x10 L3 band; 3 sec hold Standing shoulder flexion c 1 # bar x 15 with back against the wall for balance support Standing chest press c 1# x 10  Modalities Mechanical cervical traction 20# 60 sec; 14# 20 sec x 10 min c head of bed raised approximately 20 degrees. X 17 minutes     08/05/23 TherEx: Nustep: level 5 UE/LE x 8 minutes  TherAct Rows 2x10 L3 band; 3 sec hold Standing horizontal abduction (limited range) L3 band 2x10 Standing bicep curls 2x10; 3# DB Standing overhead press 3# x10; 2# x10  Modalities Mechanical cervical traction 18# 60 sec; 12# 20 sec x 10 min    08/03/23 Therex: Nustep: level 5 UE/LE x 6 minutes TherActivites:  Rows: x 15 slow eccentrics green TB Shoulder extension: x 15 red TB Standing shoulder flexion 2 x 10 back against the wall for balance  Shoulder squeezes x 10  Mechanical Traction:  18# max pull, to 12# minimum pull      PATIENT EDUCATION:  Education details: HEP, POC Person educated: Patient Education method: Programmer, multimedia, Facilities manager, Verbal cues, and Handouts Education comprehension: verbalized understanding, returned demonstration, and verbal cues required  HOME EXERCISE PROGRAM: Access Code: [email or web address] URL: [email or web address] Date: 07/14/2023 Prepared by: Delon Lunger  Exercises - Supine Cervical Retraction with Towel  - 2 x daily - 7 x weekly - 10 reps - 5 seconds hold - Supine Chin Tuck  - 2 x daily - 7 x weekly - 10 reps - 5 seconds hold - Supine Cervical Rotation PROM  - 2 x daily - 7 x weekly - 5 reps - 5 second hold - Seated Upper Trapezius Stretch  - 2 x daily - 7 x weekly - 5 reps - 5 seconds hold  ASSESSMENT:  CLINICAL IMPRESSION: Seynabou arrives to session reporting no pain but notes stiffness and tension mostly on the  L side of her neck/ shoulders. Worked on addressing muscle tension in the R upper trap/ levator scapuale followed with IASTM techniques and stretching. Continued working on posterior chain activation which she did well with but fatigues quickly. Continued mechanics traction combined with MHP end of sessoin which she noted relief of tension and improvement in function.   OBJECTIVE IMPAIRMENTS: decreased balance, impaired flexibility, postural dysfunction, and pain.   ACTIVITY LIMITATIONS: lifting, dressing, and reach over head  PARTICIPATION LIMITATIONS: community activity  PERSONAL FACTORS: see PMH above  are also affecting patient's functional outcome.   REHAB POTENTIAL: Good  CLINICAL DECISION MAKING: Stable/uncomplicated  EVALUATION COMPLEXITY: Low   GOALS: Goals reviewed with patient? Yes  SHORT TERM GOALS: (target date for Short term goals are 3 weeks 08/04/2023)  1.Patient will demonstrate independent use of home exercise program to maintain progress from in clinic treatments. Goal status: MET 08/05/23    LONG TERM GOALS: (target dates for all long term goals are 8 weeks  09/11/2023)   1. Patient will demonstrate/report pain at worst less than or equal to 2/10 to facilitate minimal limitation in daily activity secondary to pain symptoms. Goal status: On-going 08/03/23   2. Patient will demonstrate independent use of home exercise program to facilitate ability to maintain/progress functional gains from skilled physical therapy services. Goal status: New   3. Patient will demonstrate Patient specific functional scale avg > or = 3.5 to indicate reduced disability due to condition.  Goal status: New   4.  Patient will demonstrate  cervical extension to >/= 12 degrees s symptoms to facilitate usual head movements for daily activity including driving, self care.   Goal status: New   5.  Pt will improve bilateral cervical rotation to >/= 45 degrees for improvements in functional  movements.   Goal status: New     PLAN:  PT FREQUENCY: 1-2x/week  PT DURATION: 10 weeks  Can include 02853- PT Re-evaluation, 97110-Therapeutic exercises, 97530- Therapeutic activity, [id number]- Neuromuscular re-education, 97535- Self Care, 97140- Manual therapy, [phone]- Gait training, [phone]- Orthotic Fit/training, [phone]- Canalith repositioning, [id number]- Aquatic Therapy, [phone]- Electrical stimulation (unattended), [id number] Physical performance testing, 97016- Vasopneumatic device, [id number]- Ultrasound, [id number]- Traction (mechanical), [id number]- Ionotophoresis 4mg /ml Dexamethasone ,  [id number] - Needle insertion w/o injection 1 or 2 muscles, 20561 - Needle insertion w/o injection 3 or more muscles.   Patient/Family education, Balance training, Stair training, Taping, Dry Needling, Joint mobilization, Joint manipulation, Spinal manipulation, Spinal mobilization, Scar mobilization, Vestibular training, Visual/preceptual remediation/compensation, DME instructions, Cryotherapy, and Moist heat.  All performed as medically necessary.  All included unless contraindicated  PLAN FOR NEXT SESSION: strengthening as able,  continue with cervical ROM, postural exercises as tolerated Response to Traction with increased pull as tolerated.   Daisuke Bailey PT, DPT, LAT, ATC  09/01/23  3:26 PM

## 2023-09-01 NOTE — Therapy (Signed)
 OUTPATIENT PHYSICAL THERAPY CERVICAL TREATMENT  Patient Name: Olivia Evans MRN: [id number] DOB:11-09-1926, 89 y.o., female Today's Date: 09/01/2023  END OF SESSION:  PT End of Session - 09/01/23 1334     Visit Number 9    Number of Visits 20    Date for PT Re-Evaluation 09/25/23    Authorization Type [employer] $10 copay    Progress Note Due on Visit 10    PT Start Time 1333    PT Stop Time 1421    PT Time Calculation (min) 48 min    Activity Tolerance Patient tolerated treatment well    Behavior During Therapy [facility] for tasks assessed/performed             Past Medical History:  Diagnosis Date   Arthritis KNEES   Bladder pain    Chronic constipation    Diverticulosis 2013   Dr Aneita   Dyslipidemia    Heart murmur    Hemorrhoids    History of adenomatous polyp of colon    2003 -- VILLOUS   History of breast cancer 2001---RIGHT BREAST CANCER   S/P PARTIAL MASTECTOMY AND RADIATION--  NO RECURRENCE   History of palpitations    Incomplete right bundle branch block (RBBB)    Interstitial cystitis    Dr McDiarmid   Lower urinary tract symptoms (LUTS)    Normal cardiac stress test    2000  PER MD NOTE   Osteopenia    Wears dentures    full -upper/  partial lower   Wears glasses    Past Surgical History:  Procedure Laterality Date   BENIGN TUMOR REMOVED  1960'S   SECOND RIB   CATARACT EXTRACTION W/ INTRAOCULAR LENS  IMPLANT, BILATERAL  feb 2016   CYSTO WITH HYDRODISTENSION  07/29/2011   Procedure: CYSTOSCOPY/HYDRODISTENSION;  Surgeon: Glendia DELENA Elizabeth, MD;  Location: [facility];  Service: Urology;  Laterality: N/A;  with fulguration of bladder ulcer   CYSTO WITH HYDRODISTENSION N/A 08/28/2014   Procedure: CYSTO/HYDRODISTENSION, FULGURATION OF ULCERS;  Surgeon: Glendia Elizabeth, MD;  Location: [facility];  Service: Urology;  Laterality: N/A;   CYSTO/  HYDRODISTENTION/  INSTILLATION THERAPY  x2  01-18- and 11-15- 2011    CYSTO/ HOD/  BLADDER BX'S/  FULGERATION HUNNER ULCER'S /  INSTILLATION THERAPY  10-01-2010   PARTIAL MASTECTOMY W/ SLN DISSECTION Right 12-19-1999   ROTATOR CUFF REPAIR     TUBAL LIGATION     VAGINAL HYSTERECTOMY  1970'S   WITH APPENDECTOMY   VARICOSE VEIN SURGERY     Patient Active Problem List   Diagnosis Date Noted   Pain and swelling of lower extremity, right 01/16/2022   Murmur 11/06/2021   Myelodysplasia (myelodysplastic syndrome) (HCC) 11/05/2021   Neck pain 10/07/2021   Occipital neuralgia of left side 10/04/2021   Decreased hearing of both ears 03/08/2021   Bicytopenia 10/23/2020   History of breast cancer on right 10/16/2020   Anemia 11/28/2018   Prediabetes 10/13/2018   Bilateral leg edema 09/12/2017   Bilateral primary osteoarthritis of knee 12/22/2016   SOB (shortness of breath) on exertion 10/15/2016   Osteoporosis 03/26/2016   Diverticulosis of colon without hemorrhage 11/15/2013   Vitamin D  deficiency 06/30/2012   INTERSTITIAL CYSTITIS 10/09/2009   Constipation 12/13/2007   History of colonic polyps 12/13/2007   SYNCOPE 06/24/2006    PCP: Geofm Glade PARAS, MD   REFERRING PROVIDER: Geofm Glade PARAS, MD   REFERRING DIAG:  Diagnosis  M54.2,G89.29 (ICD-10-CM) - Chronic  neck pain    THERAPY DIAG:  Cervicalgia  Unsteadiness on feet  Muscle weakness (generalized)  Rationale for Evaluation and Treatment: Rehabilitation  ONSET DATE: Years  SUBJECTIVE:                                                                                                                                                                                                         SUBJECTIVE STATEMENT: Doing okay today, still having pain in Rt shoulder and neck  PERTINENT HISTORY:  See PMH  PAIN:  NPRS scale: did not rate today Pain location: posterior neck Pain description: achy Aggravating factors: extension, rotation Relieving factors: resting  PRECAUTIONS: Other: h/o breast  cancer    WEIGHT BEARING RESTRICTIONS: No  FALLS:  Has patient fallen in last 6 months? No  LIVING ENVIRONMENT: Lives with: lives with their family and lives alone Lives in: House/apartment Stairs: Yes: External: 3 steps; on right going up Has following equipment at home: Single point cane and Grab bars  OCCUPATION: retired  PLOF: Independent  PATIENT GOALS: Be able to move my neck without pain  Next MD visit:  OBJECTIVE:   DIAGNOSTIC FINDINGS:  2023:  2 view radiographs of her cervical spine demonstrate overall  well-maintained alignment.  She does have degenerative changes especially  at C4-5 C5-6 C6-7.  Do not see any acute fractures or other osseous  injuries   PATIENT SURVEYS: Patient-Specific Activity Scoring Scheme  0 represents "unable to perform." 10 represents "able to perform at prior level. 0 1 2 3 4 5 6 7 8 9  10 (Date and Score)   Activity Eval  07/14/23 08/03/23   1. Looking up  1 1   2. Turning head 2  4  Score 1.5 2.5   Total score = sum of the activity scores/number of activities Minimum detectable change (90%CI) for average score = 2 points Minimum detectable change (90%CI) for single activity score = 3 points   COGNITION: Overall cognitive status: Within functional limits for tasks assessed  SENSATION: WFL  POSTURE:  rounded shoulders, forward head, and decreased lumbar lordosis  PALPATION: TTP: cervical paraspinals and bilateral upper traps   CERVICAL ROM:   ROM AROM (deg) Eval 07/14/23  Flexion 40  Extension 8  Right lateral flexion 28 Resting position is 8 tilted to Rt  Left lateral flexion 12, c pain  Right rotation 34  Left rotation 36   (Blank rows = not tested)  UPPER EXTREMITY ROM:   ROM Right Eval 07/14/23 Left Eval 07/14/23  Shoulder flexion 160 154  Shoulder  extension 54 56  Shoulder abduction 155 155   (Blank rows = not tested)  UPPER EXTREMITY MMT:  MMT Right Eval 07/14/23 Left eval  Shoulder  flexion 3.6 ppsi 4.4 ppsi  Shoulder extension    Shoulder abduction    Shoulder adduction    Shoulder extension    Shoulder internal rotation    Shoulder external rotation    Middle trapezius    Lower trapezius    Elbow flexion    Elbow extension    Wrist flexion    Wrist extension    Wrist ulnar deviation    Wrist radial deviation    Wrist pronation    Wrist supination    Grip strength     (Blank rows = not tested)  FUNCTIONAL TESTS:  07/14/23 Timed up and go (TUG): 17.41, c UE support                                                                                                                                                                                TODAY'S TREATMENT [facility] Adult PT Treatment:                                                DATE: 09/01/23 UBE L3 x 4 min (FWD/BWD x 2 min each) L upper trap stretch 2 x 30 sec  MTPR of the R upper trap / Levator Supine scapular ER with retraction 2 x 12 Bil scapular protraction in supine 1 x 12 Pushing out on pilates ring with shoulder flexion in supine 1 x 12.  Discuss posture and MTPR techniques at home.  Modalities Mechanical cervical traction stepped increase to 18# x 10 min MHP applied in sitting for 10 min during traction  08/25/23 TherEx: UBE 2'/2' fwd and backward for warm up while taking subjective Rows 2x10 GTB - 3 sec hold Seated 90 90 OH press - 2# Standing bicep curls 2x10; 3# DB Bil ER - RTB  Modalities Mechanical cervical traction stepped increase to 18# x 10 min MHP applied in sitting for 10 min following visit   08/19/23 TherEx: UBE: level 1: 3 minutes each direction With e-stim: Scapular retraction x 10 reps; 3 sec hold Bil ER with scap retraction x 10 reps; 3 sec hold Seated bicep curls 2x10; 2#  Seated shoulder flexion 2x10 bil; 2# Seated cervical retraction x 10 reps  Modalities Pre-mod to Rt post shoulder x 20 min with intensity to tolerance during exercises (intensity decreased  throughout session as pain increased in Rt shoulder)  08/10/23 TherEx: UBE: level  1: 3 minutes each direction Cervical retraction standing x 10 holding 3 sec  TherAct Rows 2x10 L3 band; 3 sec hold Standing shoulder flexion c 1 # bar x 15 with back against the wall for balance support Standing chest press c 1# x 10  Modalities Mechanical cervical traction 20# 60 sec; 14# 20 sec x 10 min c head of bed raised approximately 20 degrees. X 17 minutes     08/05/23 TherEx: Nustep: level 5 UE/LE x 8 minutes  TherAct Rows 2x10 L3 band; 3 sec hold Standing horizontal abduction (limited range) L3 band 2x10 Standing bicep curls 2x10; 3# DB Standing overhead press 3# x10; 2# x10  Modalities Mechanical cervical traction 18# 60 sec; 12# 20 sec x 10 min    08/03/23 Therex: Nustep: level 5 UE/LE x 6 minutes TherActivites:  Rows: x 15 slow eccentrics green TB Shoulder extension: x 15 red TB Standing shoulder flexion 2 x 10 back against the wall for balance  Shoulder squeezes x 10  Mechanical Traction:  18# max pull, to 12# minimum pull      PATIENT EDUCATION:  Education details: HEP, POC Person educated: Patient Education method: Programmer, multimedia, Facilities manager, Verbal cues, and Handouts Education comprehension: verbalized understanding, returned demonstration, and verbal cues required  HOME EXERCISE PROGRAM: Access Code: [email or web address] URL: [email or web address] Date: 07/14/2023 Prepared by: Delon Lunger  Exercises - Supine Cervical Retraction with Towel  - 2 x daily - 7 x weekly - 10 reps - 5 seconds hold - Supine Chin Tuck  - 2 x daily - 7 x weekly - 10 reps - 5 seconds hold - Supine Cervical Rotation PROM  - 2 x daily - 7 x weekly - 5 reps - 5 second hold - Seated Upper Trapezius Stretch  - 2 x daily - 7 x weekly - 5 reps - 5 seconds hold  ASSESSMENT:  CLINICAL IMPRESSION: Seynabou arrives to session reporting no pain but notes stiffness and tension mostly on the  L side of her neck/ shoulders. Worked on addressing muscle tension in the R upper trap/ levator scapuale followed with IASTM techniques and stretching. Continued working on posterior chain activation which she did well with but fatigues quickly. Continued mechanics traction combined with MHP end of sessoin which she noted relief of tension and improvement in function.   OBJECTIVE IMPAIRMENTS: decreased balance, impaired flexibility, postural dysfunction, and pain.   ACTIVITY LIMITATIONS: lifting, dressing, and reach over head  PARTICIPATION LIMITATIONS: community activity  PERSONAL FACTORS: see PMH above  are also affecting patient's functional outcome.   REHAB POTENTIAL: Good  CLINICAL DECISION MAKING: Stable/uncomplicated  EVALUATION COMPLEXITY: Low   GOALS: Goals reviewed with patient? Yes  SHORT TERM GOALS: (target date for Short term goals are 3 weeks 08/04/2023)  1.Patient will demonstrate independent use of home exercise program to maintain progress from in clinic treatments. Goal status: MET 08/05/23    LONG TERM GOALS: (target dates for all long term goals are 8 weeks  09/11/2023)   1. Patient will demonstrate/report pain at worst less than or equal to 2/10 to facilitate minimal limitation in daily activity secondary to pain symptoms. Goal status: On-going 08/03/23   2. Patient will demonstrate independent use of home exercise program to facilitate ability to maintain/progress functional gains from skilled physical therapy services. Goal status: New   3. Patient will demonstrate Patient specific functional scale avg > or = 3.5 to indicate reduced disability due to condition.  Goal status: New   4.  Patient will demonstrate  cervical extension to >/= 12 degrees s symptoms to facilitate usual head movements for daily activity including driving, self care.   Goal status: New   5.  Pt will improve bilateral cervical rotation to >/= 45 degrees for improvements in functional  movements.   Goal status: New     PLAN:  PT FREQUENCY: 1-2x/week  PT DURATION: 10 weeks  Can include 02853- PT Re-evaluation, 97110-Therapeutic exercises, 97530- Therapeutic activity, [id number]- Neuromuscular re-education, 97535- Self Care, 97140- Manual therapy, [phone]- Gait training, [phone]- Orthotic Fit/training, [phone]- Canalith repositioning, [id number]- Aquatic Therapy, [phone]- Electrical stimulation (unattended), [id number] Physical performance testing, 97016- Vasopneumatic device, [id number]- Ultrasound, [id number]- Traction (mechanical), [id number]- Ionotophoresis 4mg /ml Dexamethasone ,  [id number] - Needle insertion w/o injection 1 or 2 muscles, 20561 - Needle insertion w/o injection 3 or more muscles.   Patient/Family education, Balance training, Stair training, Taping, Dry Needling, Joint mobilization, Joint manipulation, Spinal manipulation, Spinal mobilization, Scar mobilization, Vestibular training, Visual/preceptual remediation/compensation, DME instructions, Cryotherapy, and Moist heat.  All performed as medically necessary.  All included unless contraindicated  PLAN FOR NEXT SESSION: strengthening as able,  continue with cervical ROM, postural exercises as tolerated Response to Traction with increased pull as tolerated.   Daisuke Bailey PT, DPT, LAT, ATC  09/01/23  3:26 PM

## 2023-09-08 ENCOUNTER — Ambulatory Visit: Admitting: Physical Therapy

## 2023-09-08 ENCOUNTER — Encounter: Payer: Self-pay | Admitting: Physical Therapy

## 2023-09-08 DIAGNOSIS — M542 Cervicalgia: Secondary | ICD-10-CM | POA: Diagnosis not present

## 2023-09-08 DIAGNOSIS — R2681 Unsteadiness on feet: Secondary | ICD-10-CM

## 2023-09-08 DIAGNOSIS — M6281 Muscle weakness (generalized): Secondary | ICD-10-CM

## 2023-09-08 NOTE — Therapy (Signed)
 Progress Note Reporting Period 6/24 to 8/19  See note below for Objective Data and Assessment of Progress/Goals.     Patient Name: Olivia Evans MRN: [id number] DOB:25-Mar-1926, 89 y.o., female Today's Date: 09/08/2023  END OF SESSION:  PT End of Session - 09/08/23 1325     Visit Number 10    Number of Visits 20    Date for PT Re-Evaluation 10/20/23    Authorization Type [employer] $10 copay    Progress Note Due on Visit 20    PT Start Time 1330    PT Stop Time 1411    PT Time Calculation (min) 41 min    Activity Tolerance Patient tolerated treatment well    Behavior During Therapy [facility] for tasks assessed/performed             Past Medical History:  Diagnosis Date   Arthritis KNEES   Bladder pain    Chronic constipation    Diverticulosis 2013   Dr Aneita   Dyslipidemia    Heart murmur    Hemorrhoids    History of adenomatous polyp of colon    2003 -- VILLOUS   History of breast cancer 2001---RIGHT BREAST CANCER   S/P PARTIAL MASTECTOMY AND RADIATION--  NO RECURRENCE   History of palpitations    Incomplete right bundle branch block (RBBB)    Interstitial cystitis    Dr McDiarmid   Lower urinary tract symptoms (LUTS)    Normal cardiac stress test    2000  PER MD NOTE   Osteopenia    Wears dentures    full -upper/  partial lower   Wears glasses    Past Surgical History:  Procedure Laterality Date   BENIGN TUMOR REMOVED  1960'S   SECOND RIB   CATARACT EXTRACTION W/ INTRAOCULAR LENS  IMPLANT, BILATERAL  feb 2016   CYSTO WITH HYDRODISTENSION  07/29/2011   Procedure: CYSTOSCOPY/HYDRODISTENSION;  Surgeon: Glendia DELENA Elizabeth, MD;  Location: [facility] [place];  Service: Urology;  Laterality: N/A;  with fulguration of bladder ulcer   CYSTO WITH HYDRODISTENSION N/A 08/28/2014   Procedure: CYSTO/HYDRODISTENSION, FULGURATION OF ULCERS;  Surgeon: Glendia Elizabeth, MD;  Location: [facility];  Service: Urology;  Laterality: N/A;    CYSTO/  HYDRODISTENTION/  INSTILLATION THERAPY  x2  01-18- and 11-15- 2011   CYSTO/ HOD/  BLADDER BX'S/  FULGERATION HUNNER ULCER'S /  INSTILLATION THERAPY  10-01-2010   PARTIAL MASTECTOMY W/ SLN DISSECTION Right 12-19-1999   ROTATOR CUFF REPAIR     TUBAL LIGATION     VAGINAL HYSTERECTOMY  1970'S   WITH APPENDECTOMY   VARICOSE VEIN SURGERY     Patient Active Problem List   Diagnosis Date Noted   Pain and swelling of lower extremity, right 01/16/2022   Murmur 11/06/2021   Myelodysplasia (myelodysplastic syndrome) (HCC) 11/05/2021   Neck pain 10/07/2021   Occipital neuralgia of left side 10/04/2021   Decreased hearing of both ears 03/08/2021   Bicytopenia 10/23/2020   History of breast cancer on right 10/16/2020   Anemia 11/28/2018   Prediabetes 10/13/2018   Bilateral leg edema 09/12/2017   Bilateral primary osteoarthritis of knee 12/22/2016   SOB (shortness of breath) on exertion 10/15/2016   Osteoporosis 03/26/2016   Diverticulosis of colon without hemorrhage 11/15/2013   Vitamin D  deficiency 06/30/2012   INTERSTITIAL CYSTITIS 10/09/2009   Constipation 12/13/2007   History of colonic polyps 12/13/2007   SYNCOPE 06/24/2006    PCP: Geofm Glade PARAS, MD  REFERRING PROVIDER: Geofm Glade PARAS, MD   REFERRING DIAG:  Diagnosis  [phone] (ICD-10-CM) - Chronic neck pain    THERAPY DIAG:  Cervicalgia - Plan: PT plan of care cert/re-cert  Unsteadiness on feet - Plan: PT plan of care cert/re-cert  Muscle weakness (generalized) - Plan: PT plan of care cert/re-cert  Rationale for Evaluation and Treatment: Rehabilitation  ONSET DATE: Years  SUBJECTIVE:                                                                                                                                                                                                         SUBJECTIVE STATEMENT: Pt reports that she feeling better.  Responded well to manual therapy lat visit.  Reports pain with  rotation on eval was 9/10 and now is 5-6/10 and she feels that she is able to turn her head without turning her entire body now.  PERTINENT HISTORY:  See PMH  PAIN:  NPRS scale: did not rate today Pain location: posterior neck Pain description: achy Aggravating factors: extension, rotation Relieving factors: resting  PRECAUTIONS: Other: h/o breast cancer    WEIGHT BEARING RESTRICTIONS: No  FALLS:  Has patient fallen in last 6 months? No  LIVING ENVIRONMENT: Lives with: lives with their family and lives alone Lives in: House/apartment Stairs: Yes: External: 3 steps; on right going up Has following equipment at home: Single point cane and Grab bars  OCCUPATION: retired  PLOF: Independent  PATIENT GOALS: Be able to move my neck without pain  Next MD visit:  OBJECTIVE:   DIAGNOSTIC FINDINGS:  2023:  2 view radiographs of her cervical spine demonstrate overall  well-maintained alignment.  She does have degenerative changes especially  at C4-5 C5-6 C6-7.  Do not see any acute fractures or other osseous  injuries   PATIENT SURVEYS: Patient-Specific Activity Scoring Scheme  0 represents "unable to perform." 10 represents "able to perform at prior level. 0 1 2 3 4 5 6 7 8 9  10 (Date and Score)   Activity Eval  07/14/23 08/03/23  8/19  1. Looking up  1 1  3   2. Turning head 2  4 4   Score 1.5 2.5 3.5   Total score = sum of the activity scores/number of activities Minimum detectable change (90%CI) for average score = 2 points Minimum detectable change (90%CI) for single activity score = 3 points   COGNITION: Overall cognitive status: Within functional limits for tasks assessed  SENSATION: WFL  POSTURE:  rounded shoulders, forward head, and decreased lumbar lordosis  PALPATION: TTP: cervical paraspinals  and bilateral upper traps   CERVICAL ROM:   ROM AROM (deg) Eval 07/14/23 8/19  Flexion 40   Extension 8 25  Right lateral flexion 28 Resting  position is 8 tilted to Rt   Left lateral flexion 12, c pain   Right rotation 34 42  Left rotation 36 38   (Blank rows = not tested)  UPPER EXTREMITY ROM:   ROM Right Eval 07/14/23 Left Eval 07/14/23  Shoulder flexion 160 154  Shoulder extension 54 56  Shoulder abduction 155 155   (Blank rows = not tested)  UPPER EXTREMITY MMT:  MMT Right Eval 07/14/23 Left eval  Shoulder flexion 3.6 ppsi 4.4 ppsi  Shoulder extension    Shoulder abduction    Shoulder adduction    Shoulder extension    Shoulder internal rotation    Shoulder external rotation    Middle trapezius    Lower trapezius    Elbow flexion    Elbow extension    Wrist flexion    Wrist extension    Wrist ulnar deviation    Wrist radial deviation    Wrist pronation    Wrist supination    Grip strength     (Blank rows = not tested)  FUNCTIONAL TESTS:  07/14/23 Timed up and go (TUG): 17.41, c UE support                                                                                                                                                                                TODAY'S TREATMENT  [facility] Adult PT Treatment:                                                DATE: 09/08/23  Therex: L upper trap stretch 2 x 30 sec  MTPR of the R upper trap / Levator Supine scapular ER with retraction 2 x 15 Bil scapular protraction in supine 1 x 15 Horizontal abd with shoulder flexion in supine RTB 2x8.   Therapeutic Activity  collecting information for goals, checking progress, and reviewing with patient  Modalities Mechanical cervical traction stepped increase to 18# x 10 min MHP applied in for 10 min during traction  [facility] Adult PT Treatment:                                                DATE: 09/01/23 UBE L3 x 4 min (FWD/BWD x 2 min each)  L upper trap stretch 2 x 30 sec  MTPR of the R upper trap / Levator Supine scapular ER with retraction 2 x 12 Bil scapular protraction in supine 1 x 12 Pushing out on  pilates ring with shoulder flexion in supine 1 x 12.  Discuss posture and MTPR techniques at home.  Modalities Mechanical cervical traction stepped increase to 18# x 10 min MHP applied in sitting for 10 min during traction  08/25/23 TherEx: UBE 2'/2' fwd and backward for warm up while taking subjective Rows 2x10 GTB - 3 sec hold Seated 90 90 OH press - 2# Standing bicep curls 2x10; 3# DB Bil ER - RTB  Modalities Mechanical cervical traction stepped increase to 18# x 10 min MHP applied in sitting for 10 min following visit   08/19/23 TherEx: UBE: level 1: 3 minutes each direction With e-stim: Scapular retraction x 10 reps; 3 sec hold Bil ER with scap retraction x 10 reps; 3 sec hold Seated bicep curls 2x10; 2#  Seated shoulder flexion 2x10 bil; 2# Seated cervical retraction x 10 reps  Modalities Pre-mod to Rt post shoulder x 20 min with intensity to tolerance during exercises (intensity decreased throughout session as pain increased in Rt shoulder)  08/10/23 TherEx: UBE: level 1: 3 minutes each direction Cervical retraction standing x 10 holding 3 sec  TherAct Rows 2x10 L3 band; 3 sec hold Standing shoulder flexion c 1 # bar x 15 with back against the wall for balance support Standing chest press c 1# x 10  Modalities Mechanical cervical traction 20# 60 sec; 14# 20 sec x 10 min c head of bed raised approximately 20 degrees. X 17 minutes     08/05/23 TherEx: Nustep: level 5 UE/LE x 8 minutes  TherAct Rows 2x10 L3 band; 3 sec hold Standing horizontal abduction (limited range) L3 band 2x10 Standing bicep curls 2x10; 3# DB Standing overhead press 3# x10; 2# x10  Modalities Mechanical cervical traction 18# 60 sec; 12# 20 sec x 10 min    08/03/23 Therex: Nustep: level 5 UE/LE x 6 minutes TherActivites:  Rows: x 15 slow eccentrics green TB Shoulder extension: x 15 red TB Standing shoulder flexion 2 x 10 back against the wall for balance  Shoulder squeezes x 10   Mechanical Traction:  18# max pull, to 12# minimum pull      PATIENT EDUCATION:  Education details: HEP, POC Person educated: Patient Education method: Programmer, multimedia, Facilities manager, Verbal cues, and Handouts Education comprehension: verbalized understanding, returned demonstration, and verbal cues required  HOME EXERCISE PROGRAM: Access Code: [email or web address] URL: [email or web address] Date: 07/14/2023 Prepared by: Delon Lunger  Exercises - Supine Cervical Retraction with Towel  - 2 x daily - 7 x weekly - 10 reps - 5 seconds hold - Supine Chin Tuck  - 2 x daily - 7 x weekly - 10 reps - 5 seconds hold - Supine Cervical Rotation PROM  - 2 x daily - 7 x weekly - 5 reps - 5 second hold - Seated Upper Trapezius Stretch  - 2 x daily - 7 x weekly - 5 reps - 5 seconds hold  ASSESSMENT:  CLINICAL IMPRESSION: Abigayle arrives to session reporting that manual therapy last visit was uncomfortable in the moment, but what I needed.  We continued with manual therapy combined with traction and gentle shoulder strengthening.  Upon goal recheck she has made significant progress toward all long and short term goals.  She met her cervical ext ROM goal as well as her PSFS  goal today.  Her cervical rotation is improving.  Average pain with rotation is down from 9/10 (per pt today) on evaluation to an average of 5-6/10.  Given progress I will extend POC in order to continue working on ROM and pain goals to improve patient quality of life.  OBJECTIVE IMPAIRMENTS: decreased balance, impaired flexibility, postural dysfunction, and pain.   ACTIVITY LIMITATIONS: lifting, dressing, and reach over head  PARTICIPATION LIMITATIONS: community activity  PERSONAL FACTORS: see PMH above  are also affecting patient's functional outcome.   REHAB POTENTIAL: Good  CLINICAL DECISION MAKING: Stable/uncomplicated  EVALUATION COMPLEXITY: Low   GOALS: Goals reviewed with patient? Yes  SHORT TERM GOALS:  (target date for Short term goals are 3 weeks 08/04/2023)  1.Patient will demonstrate independent use of home exercise program to maintain progress from in clinic treatments. Goal status: MET 08/05/23    LONG TERM GOALS: (target dates for all long term goals are 8 weeks  09/11/2023) updated to 10/20/2023    1. Patient will demonstrate/report pain at worst less than or equal to 2/10 to facilitate minimal limitation in daily activity secondary to pain symptoms. 8/19: 6/10 current, 9-10/10 on eval Goal status: On-going   2. Patient will demonstrate independent use of home exercise program to facilitate ability to maintain/progress functional gains from skilled physical therapy services. 8/19: 50-70% compliant depending on pian level Goal status: Ongoing   3. Patient will demonstrate Patient specific functional scale avg > or = 3.5 to indicate reduced disability due to condition.  Goal status: MET   4.  Patient will demonstrate cervical extension to >/= 12 degrees s symptoms to facilitate usual head movements for daily activity including driving, self care.   Goal status: MET   5.  Pt will improve bilateral cervical rotation to >/= 45 degrees for improvements in functional movements.   8/19: see chart Goal status: Ongoing     PLAN:  PT FREQUENCY: 1-2x/week  PT DURATION: 6 weeks  Can include 02853- PT Re-evaluation, 97110-Therapeutic exercises, 97530- Therapeutic activity, 97112- Neuromuscular re-education, 97535- Self Care, 97140- Manual therapy, [phone]- Gait training, [phone]- Orthotic Fit/training, [phone]- Canalith repositioning, [id number]- Aquatic Therapy, [phone]- Electrical stimulation (unattended), [id number] Physical performance testing, 97016- Vasopneumatic device, [id number]- Ultrasound, [id number]- Traction (mechanical), [id number]- Ionotophoresis 4mg /ml Dexamethasone ,  [id number] - Needle insertion w/o injection 1 or 2 muscles, [id number] - Needle insertion w/o injection 3 or more muscles.   Patient/Family education,  Balance training, Stair training, Taping, Dry Needling, Joint mobilization, Joint manipulation, Spinal manipulation, Spinal mobilization, Scar mobilization, Vestibular training, Visual/preceptual remediation/compensation, DME instructions, Cryotherapy, and Moist heat.  All performed as medically necessary.  All included unless contraindicated  PLAN FOR NEXT SESSION: strengthening as able,  continue with cervical ROM, postural exercises as tolerated Response to Traction with increased pull as tolerated.   Seaborn Nakama E Liron Eissler PT 09/08/23  2:44 PM

## 2023-09-15 ENCOUNTER — Ambulatory Visit: Admitting: Physical Therapy

## 2023-09-15 ENCOUNTER — Encounter: Payer: Self-pay | Admitting: Physical Therapy

## 2023-09-15 DIAGNOSIS — M542 Cervicalgia: Secondary | ICD-10-CM

## 2023-09-15 DIAGNOSIS — R2681 Unsteadiness on feet: Secondary | ICD-10-CM

## 2023-09-15 DIAGNOSIS — M6281 Muscle weakness (generalized): Secondary | ICD-10-CM

## 2023-09-15 NOTE — Therapy (Signed)
 PT DAILY NOTE    Patient Name: Olivia Evans MRN: [id number] DOB:1926/04/12, 89 y.o., female Today's Date: 09/15/2023  END OF SESSION:  PT End of Session - 09/15/23 1332     Visit Number 11    Number of Visits 20    Date for PT Re-Evaluation 10/20/23    Authorization Type [employer] $10 copay    Progress Note Due on Visit 20    PT Start Time 1333    PT Stop Time 1415    PT Time Calculation (min) 42 min    Activity Tolerance Patient tolerated treatment well    Behavior During Therapy [facility] for tasks assessed/performed             Past Medical History:  Diagnosis Date   Arthritis KNEES   Bladder pain    Chronic constipation    Diverticulosis 2013   Dr Aneita   Dyslipidemia    Heart murmur    Hemorrhoids    History of adenomatous polyp of colon    2003 -- VILLOUS   History of breast cancer 2001---RIGHT BREAST CANCER   S/P PARTIAL MASTECTOMY AND RADIATION--  NO RECURRENCE   History of palpitations    Incomplete right bundle branch block (RBBB)    Interstitial cystitis    Dr McDiarmid   Lower urinary tract symptoms (LUTS)    Normal cardiac stress test    2000  PER MD NOTE   Osteopenia    Wears dentures    full -upper/  partial lower   Wears glasses    Past Surgical History:  Procedure Laterality Date   BENIGN TUMOR REMOVED  1960'S   SECOND RIB   CATARACT EXTRACTION W/ INTRAOCULAR LENS  IMPLANT, BILATERAL  feb 2016   CYSTO WITH HYDRODISTENSION  07/29/2011   Procedure: CYSTOSCOPY/HYDRODISTENSION;  Surgeon: Glendia DELENA Elizabeth, MD;  Location: [facility];  Service: Urology;  Laterality: N/A;  with fulguration of bladder ulcer   CYSTO WITH HYDRODISTENSION N/A 08/28/2014   Procedure: CYSTO/HYDRODISTENSION, FULGURATION OF ULCERS;  Surgeon: Glendia Elizabeth, MD;  Location: [facility] [place];  Service: Urology;  Laterality: N/A;   CYSTO/  HYDRODISTENTION/  INSTILLATION THERAPY  x2  01-18- and 11-15- 2011   CYSTO/ HOD/  BLADDER BX'S/   FULGERATION HUNNER ULCER'S /  INSTILLATION THERAPY  10-01-2010   PARTIAL MASTECTOMY W/ SLN DISSECTION Right 12-19-1999   ROTATOR CUFF REPAIR     TUBAL LIGATION     VAGINAL HYSTERECTOMY  1970'S   WITH APPENDECTOMY   VARICOSE VEIN SURGERY     Patient Active Problem List   Diagnosis Date Noted   Pain and swelling of lower extremity, right 01/16/2022   Murmur 11/06/2021   Myelodysplasia (myelodysplastic syndrome) (HCC) 11/05/2021   Neck pain 10/07/2021   Occipital neuralgia of left side 10/04/2021   Decreased hearing of both ears 03/08/2021   Bicytopenia 10/23/2020   History of breast cancer on right 10/16/2020   Anemia 11/28/2018   Prediabetes 10/13/2018   Bilateral leg edema 09/12/2017   Bilateral primary osteoarthritis of knee 12/22/2016   SOB (shortness of breath) on exertion 10/15/2016   Osteoporosis 03/26/2016   Diverticulosis of colon without hemorrhage 11/15/2013   Vitamin D  deficiency 06/30/2012   INTERSTITIAL CYSTITIS 10/09/2009   Constipation 12/13/2007   History of colonic polyps 12/13/2007   SYNCOPE 06/24/2006    PCP: Geofm Glade PARAS, MD   REFERRING PROVIDER: Geofm Glade PARAS, MD   REFERRING DIAG:  Diagnosis  M54.2,G89.29 (ICD-10-CM) -  Chronic neck pain    THERAPY DIAG:  Cervicalgia  Unsteadiness on feet  Muscle weakness (generalized)  Rationale for Evaluation and Treatment: Rehabilitation  ONSET DATE: Years  SUBJECTIVE:                                                                                                                                                                                                         SUBJECTIVE STATEMENT: Pt reports that she is seeing some improvement.  Before PT she was having all bad days and now she has some days with lower pain.  PERTINENT HISTORY:  See PMH  PAIN:  NPRS scale: did not rate today Pain location: posterior neck Pain description: achy Aggravating factors: extension, rotation Relieving  factors: resting  PRECAUTIONS: Other: h/o breast cancer    WEIGHT BEARING RESTRICTIONS: No  FALLS:  Has patient fallen in last 6 months? No  LIVING ENVIRONMENT: Lives with: lives with their family and lives alone Lives in: House/apartment Stairs: Yes: External: 3 steps; on right going up Has following equipment at home: Single point cane and Grab bars  OCCUPATION: retired  PLOF: Independent  PATIENT GOALS: Be able to move my neck without pain  Next MD visit:  OBJECTIVE:   DIAGNOSTIC FINDINGS:  2023:  2 view radiographs of her cervical spine demonstrate overall  well-maintained alignment.  She does have degenerative changes especially  at C4-5 C5-6 C6-7.  Do not see any acute fractures or other osseous  injuries   PATIENT SURVEYS: Patient-Specific Activity Scoring Scheme  0 represents "unable to perform." 10 represents "able to perform at prior level. 0 1 2 3 4 5 6 7 8 9  10 (Date and Score)   Activity Eval  07/14/23 08/03/23  8/19  1. Looking up  1 1  3   2. Turning head 2  4 4   Score 1.5 2.5 3.5   Total score = sum of the activity scores/number of activities Minimum detectable change (90%CI) for average score = 2 points Minimum detectable change (90%CI) for single activity score = 3 points   COGNITION: Overall cognitive status: Within functional limits for tasks assessed  SENSATION: WFL  POSTURE:  rounded shoulders, forward head, and decreased lumbar lordosis  PALPATION: TTP: cervical paraspinals and bilateral upper traps   CERVICAL ROM:   ROM AROM (deg) Eval 07/14/23 8/19  Flexion 40   Extension 8 25  Right lateral flexion 28 Resting position is 8 tilted to Rt   Left lateral flexion 12, c pain   Right rotation 34 42  Left rotation 36 38   (  Blank rows = not tested)  UPPER EXTREMITY ROM:   ROM Right Eval 07/14/23 Left Eval 07/14/23  Shoulder flexion 160 154  Shoulder extension 54 56  Shoulder abduction 155 155   (Blank rows = not  tested)  UPPER EXTREMITY MMT:  MMT Right Eval 07/14/23 Left eval  Shoulder flexion 3.6 ppsi 4.4 ppsi  Shoulder extension    Shoulder abduction    Shoulder adduction    Shoulder extension    Shoulder internal rotation    Shoulder external rotation    Middle trapezius    Lower trapezius    Elbow flexion    Elbow extension    Wrist flexion    Wrist extension    Wrist ulnar deviation    Wrist radial deviation    Wrist pronation    Wrist supination    Grip strength     (Blank rows = not tested)  FUNCTIONAL TESTS:  07/14/23 Timed up and go (TUG): 17.41, c UE support                                                                                                                                                                                TODAY'S TREATMENT  [facility] Adult PT Treatment:                                                DATE: 09/15/23  Therex: 3# chest press - 2x8 Horizontal abd with shoulder flexion in supine RTB 2x8.  Horizontal abd in supine with OH reach - RTB - 2x8  Manual Therapy  Sub occipital release TPR R LS, pt in supine  Modalities Mechanical cervical traction stepped increase to 18# x 10 min MHP applied in for 10 min during traction  [facility] Adult PT Treatment:                                                DATE: 09/01/23 UBE L3 x 4 min (FWD/BWD x 2 min each) L upper trap stretch 2 x 30 sec  MTPR of the R upper trap / Levator Supine scapular ER with retraction 2 x 12 Bil scapular protraction in supine 1 x 12 Pushing out on pilates ring with shoulder flexion in supine 1 x 12.  Discuss posture and MTPR techniques at home.  Modalities Mechanical cervical traction stepped increase to 18# x 10 min MHP applied in sitting for 10 min during traction  08/25/23 TherEx: UBE 2'/2' fwd and backward for warm up while taking subjective Rows 2x10 GTB - 3 sec hold Seated 90 90 OH press - 2# Standing bicep curls 2x10; 3# DB Bil ER -  RTB  Modalities Mechanical cervical traction stepped increase to 18# x 10 min MHP applied in sitting for 10 min following visit   08/19/23 TherEx: UBE: level 1: 3 minutes each direction With e-stim: Scapular retraction x 10 reps; 3 sec hold Bil ER with scap retraction x 10 reps; 3 sec hold Seated bicep curls 2x10; 2#  Seated shoulder flexion 2x10 bil; 2# Seated cervical retraction x 10 reps  Modalities Pre-mod to Rt post shoulder x 20 min with intensity to tolerance during exercises (intensity decreased throughout session as pain increased in Rt shoulder)  08/10/23 TherEx: UBE: level 1: 3 minutes each direction Cervical retraction standing x 10 holding 3 sec  TherAct Rows 2x10 L3 band; 3 sec hold Standing shoulder flexion c 1 # bar x 15 with back against the wall for balance support Standing chest press c 1# x 10  Modalities Mechanical cervical traction 20# 60 sec; 14# 20 sec x 10 min c head of bed raised approximately 20 degrees. X 17 minutes     08/05/23 TherEx: Nustep: level 5 UE/LE x 8 minutes  TherAct Rows 2x10 L3 band; 3 sec hold Standing horizontal abduction (limited range) L3 band 2x10 Standing bicep curls 2x10; 3# DB Standing overhead press 3# x10; 2# x10  Modalities Mechanical cervical traction 18# 60 sec; 12# 20 sec x 10 min    08/03/23 Therex: Nustep: level 5 UE/LE x 6 minutes TherActivites:  Rows: x 15 slow eccentrics green TB Shoulder extension: x 15 red TB Standing shoulder flexion 2 x 10 back against the wall for balance  Shoulder squeezes x 10  Mechanical Traction:  18# max pull, to 12# minimum pull      PATIENT EDUCATION:  Education details: HEP, POC Person educated: Patient Education method: Programmer, multimedia, Facilities manager, Verbal cues, and Handouts Education comprehension: verbalized understanding, returned demonstration, and verbal cues required  HOME EXERCISE PROGRAM: Access Code: [email or web address] URL:  [email or web address] Date: 07/14/2023 Prepared by: Delon Lunger  Exercises - Supine Cervical Retraction with Towel  - 2 x daily - 7 x weekly - 10 reps - 5 seconds hold - Supine Chin Tuck  - 2 x daily - 7 x weekly - 10 reps - 5 seconds hold - Supine Cervical Rotation PROM  - 2 x daily - 7 x weekly - 5 reps - 5 second hold - Seated Upper Trapezius Stretch  - 2 x daily - 7 x weekly - 5 reps - 5 seconds hold  ASSESSMENT:  CLINICAL IMPRESSION: Olivia Evans tolerated session well with no adverse reaction.  Pt with main pain complaint R sided periscapular region.  TTP R LS which reproduces pain with pain reduction following TPR.  Concentrated on strengthening in supine today for pt comfort.  Pt reports high levels of fatigue, particularly with chest press but no increase  in pain.  Utilized combination of horizontal abd and OH movements to increase r/c and periscapular activation with OH reaching.  Pt reports overall improvement in sxs with manual therapy and traction following treatment.    OBJECTIVE IMPAIRMENTS: decreased balance, impaired flexibility, postural dysfunction, and pain.   ACTIVITY LIMITATIONS: lifting, dressing, and reach over head  PARTICIPATION LIMITATIONS: community activity  PERSONAL FACTORS: see PMH above  are also affecting patient's functional outcome.   REHAB POTENTIAL: Good  CLINICAL  DECISION MAKING: Stable/uncomplicated  EVALUATION COMPLEXITY: Low   GOALS: Goals reviewed with patient? Yes  SHORT TERM GOALS: (target date for Short term goals are 3 weeks 08/04/2023)  1.Patient will demonstrate independent use of home exercise program to maintain progress from in clinic treatments. Goal status: MET 08/05/23    LONG TERM GOALS: (target dates for all long term goals are 8 weeks  09/11/2023) updated to 10/20/2023    1. Patient will demonstrate/report pain at worst less than or equal to 2/10 to facilitate minimal limitation in daily activity secondary to  pain symptoms. 8/19: 6/10 current, 9-10/10 on eval Goal status: On-going   2. Patient will demonstrate independent use of home exercise program to facilitate ability to maintain/progress functional gains from skilled physical therapy services. 8/19: 50-70% compliant depending on pian level Goal status: Ongoing   3. Patient will demonstrate Patient specific functional scale avg > or = 3.5 to indicate reduced disability due to condition.  Goal status: MET   4.  Patient will demonstrate cervical extension to >/= 12 degrees s symptoms to facilitate usual head movements for daily activity including driving, self care.   Goal status: MET   5.  Pt will improve bilateral cervical rotation to >/= 45 degrees for improvements in functional movements.   8/19: see chart Goal status: Ongoing     PLAN:  PT FREQUENCY: 1-2x/week  PT DURATION: 6 weeks  Can include 02853- PT Re-evaluation, 97110-Therapeutic exercises, 97530- Therapeutic activity, 97112- Neuromuscular re-education, 97535- Self Care, 97140- Manual therapy, [phone]- Gait training, [phone]- Orthotic Fit/training, [phone]- Canalith repositioning, [id number]- Aquatic Therapy, [phone]- Electrical stimulation (unattended), [id number] Physical performance testing, 97016- Vasopneumatic device, [id number]- Ultrasound, [id number]- Traction (mechanical), [id number]- Ionotophoresis 4mg /ml Dexamethasone ,  [id number] - Needle insertion w/o injection 1 or 2 muscles, 20561 - Needle insertion w/o injection 3 or more muscles.   Patient/Family education, Balance training, Stair training, Taping, Dry Needling, Joint mobilization, Joint manipulation, Spinal manipulation, Spinal mobilization, Scar mobilization, Vestibular training, Visual/preceptual remediation/compensation, DME instructions, Cryotherapy, and Moist heat.  All performed as medically necessary.  All included unless contraindicated  PLAN FOR NEXT SESSION: strengthening as able,  continue with cervical ROM, postural exercises as  tolerated Response to Traction with increased pull as tolerated.   Olivia Evans PT 09/15/23  3:50 PM

## 2023-09-24 ENCOUNTER — Encounter: Payer: Self-pay | Admitting: Physical Therapy

## 2023-09-24 ENCOUNTER — Ambulatory Visit: Attending: Internal Medicine | Admitting: Physical Therapy

## 2023-09-24 DIAGNOSIS — M6281 Muscle weakness (generalized): Secondary | ICD-10-CM | POA: Insufficient documentation

## 2023-09-24 DIAGNOSIS — R2681 Unsteadiness on feet: Secondary | ICD-10-CM | POA: Diagnosis not present

## 2023-09-24 DIAGNOSIS — M542 Cervicalgia: Secondary | ICD-10-CM | POA: Diagnosis not present

## 2023-09-24 NOTE — Therapy (Signed)
 PT DAILY NOTE    Patient Name: Olivia Evans MRN: [id number] DOB:1926/12/26, 89 y.o., female Today's Date: 09/24/2023  END OF SESSION:  PT End of Session - 09/24/23 1336     Visit Number 12    Number of Visits 20    Date for PT Re-Evaluation 10/20/23    Authorization Type [employer] $10 copay    Progress Note Due on Visit 20    PT Start Time 1332    PT Stop Time 1425    PT Time Calculation (min) 53 min    Activity Tolerance Patient tolerated treatment well    Behavior During Therapy [facility] for tasks assessed/performed              Past Medical History:  Diagnosis Date   Arthritis KNEES   Bladder pain    Chronic constipation    Diverticulosis 2013   Dr Aneita   Dyslipidemia    Heart murmur    Hemorrhoids    History of adenomatous polyp of colon    2003 -- VILLOUS   History of breast cancer 2001---RIGHT BREAST CANCER   S/P PARTIAL MASTECTOMY AND RADIATION--  NO RECURRENCE   History of palpitations    Incomplete right bundle branch block (RBBB)    Interstitial cystitis    Dr McDiarmid   Lower urinary tract symptoms (LUTS)    Normal cardiac stress test    2000  PER MD NOTE   Osteopenia    Wears dentures    full -upper/  partial lower   Wears glasses    Past Surgical History:  Procedure Laterality Date   BENIGN TUMOR REMOVED  1960'S   SECOND RIB   CATARACT EXTRACTION W/ INTRAOCULAR LENS  IMPLANT, BILATERAL  feb 2016   CYSTO WITH HYDRODISTENSION  07/29/2011   Procedure: CYSTOSCOPY/HYDRODISTENSION;  Surgeon: Glendia DELENA Elizabeth, MD;  Location: [facility] [place];  Service: Urology;  Laterality: N/A;  with fulguration of bladder ulcer   CYSTO WITH HYDRODISTENSION N/A 08/28/2014   Procedure: CYSTO/HYDRODISTENSION, FULGURATION OF ULCERS;  Surgeon: Glendia Elizabeth, MD;  Location: [facility] [place];  Service: Urology;  Laterality: N/A;   CYSTO/  HYDRODISTENTION/  INSTILLATION THERAPY  x2  01-18- and 11-15- 2011   CYSTO/ HOD/  BLADDER BX'S/   FULGERATION HUNNER ULCER'S /  INSTILLATION THERAPY  10-01-2010   PARTIAL MASTECTOMY W/ SLN DISSECTION Right 12-19-1999   ROTATOR CUFF REPAIR     TUBAL LIGATION     VAGINAL HYSTERECTOMY  1970'S   WITH APPENDECTOMY   VARICOSE VEIN SURGERY     Patient Active Problem List   Diagnosis Date Noted   Pain and swelling of lower extremity, right 01/16/2022   Murmur 11/06/2021   Myelodysplasia (myelodysplastic syndrome) (HCC) 11/05/2021   Neck pain 10/07/2021   Occipital neuralgia of left side 10/04/2021   Decreased hearing of both ears 03/08/2021   Bicytopenia 10/23/2020   History of breast cancer on right 10/16/2020   Anemia 11/28/2018   Prediabetes 10/13/2018   Bilateral leg edema 09/12/2017   Bilateral primary osteoarthritis of knee 12/22/2016   SOB (shortness of breath) on exertion 10/15/2016   Osteoporosis 03/26/2016   Diverticulosis of colon without hemorrhage 11/15/2013   Vitamin D  deficiency 06/30/2012   INTERSTITIAL CYSTITIS 10/09/2009   Constipation 12/13/2007   History of colonic polyps 12/13/2007   SYNCOPE 06/24/2006    PCP: Geofm Glade PARAS, MD   REFERRING PROVIDER: Geofm Glade PARAS, MD   REFERRING DIAG:  Diagnosis  M54.2,G89.29 (ICD-10-CM) -  Chronic neck pain    THERAPY DIAG:  Cervicalgia  Unsteadiness on feet  Muscle weakness (generalized)  Rationale for Evaluation and Treatment: Rehabilitation  ONSET DATE: Years  SUBJECTIVE:                                                                                                                                                                                                         SUBJECTIVE STATEMENT: I am having more soreness in the morning that feeling better.  PERTINENT HISTORY:  See PMH  PAIN:  NPRS scale: 5-6/10 Pain location: posterior neck Pain description: achy Aggravating factors: extension, rotation Relieving factors: resting  PRECAUTIONS: Other: h/o breast cancer    WEIGHT BEARING  RESTRICTIONS: No  FALLS:  Has patient fallen in last 6 months? No  LIVING ENVIRONMENT: Lives with: lives with their family and lives alone Lives in: House/apartment Stairs: Yes: External: 3 steps; on right going up Has following equipment at home: Single point cane and Grab bars  OCCUPATION: retired  PLOF: Independent  PATIENT GOALS: Be able to move my neck without pain  Next MD visit:  OBJECTIVE:   DIAGNOSTIC FINDINGS:  2023:  2 view radiographs of her cervical spine demonstrate overall  well-maintained alignment.  She does have degenerative changes especially  at C4-5 C5-6 C6-7.  Do not see any acute fractures or other osseous  injuries   PATIENT SURVEYS: Patient-Specific Activity Scoring Scheme  0 represents "unable to perform." 10 represents "able to perform at prior level. 0 1 2 3 4 5 6 7 8 9  10 (Date and Score)   Activity Eval  07/14/23 08/03/23  8/19  1. Looking up  1 1  3   2. Turning head 2  4 4   Score 1.5 2.5 3.5   Total score = sum of the activity scores/number of activities Minimum detectable change (90%CI) for average score = 2 points Minimum detectable change (90%CI) for single activity score = 3 points   COGNITION: Overall cognitive status: Within functional limits for tasks assessed  SENSATION: WFL  POSTURE:  rounded shoulders, forward head, and decreased lumbar lordosis  PALPATION: TTP: cervical paraspinals and bilateral upper traps   CERVICAL ROM:   ROM AROM (deg) Eval 07/14/23 8/19  Flexion 40   Extension 8 25  Right lateral flexion 28 Resting position is 8 tilted to Rt   Left lateral flexion 12, c pain   Right rotation 34 42  Left rotation 36 38   (Blank rows = not tested)  UPPER EXTREMITY ROM:   ROM Right Eval 07/14/23 Left  Eval 07/14/23  Shoulder flexion 160 154  Shoulder extension 54 56  Shoulder abduction 155 155   (Blank rows = not tested)  UPPER EXTREMITY MMT:  MMT Right Eval 07/14/23 Left eval  Shoulder  flexion 3.6 ppsi 4.4 ppsi  Shoulder extension    Shoulder abduction    Shoulder adduction    Shoulder extension    Shoulder internal rotation    Shoulder external rotation    Middle trapezius    Lower trapezius    Elbow flexion    Elbow extension    Wrist flexion    Wrist extension    Wrist ulnar deviation    Wrist radial deviation    Wrist pronation    Wrist supination    Grip strength     (Blank rows = not tested)  FUNCTIONAL TESTS:  07/14/23 Timed up and go (TUG): 17.41, c UE support                                                                                                                                                                                TODAY'S TREATMENT [facility] Adult PT Treatment:                                                DATE: 09/24/23 MTPR along the R upper trap/ levator scapulae, rhomboids and cervical paraspinals IASTM along the upper trap/ levator s R upper trap stretch UBE L3 FWD 2 min, BWD x 3 min  Scapular retraction with ER 2 x 10  Thoracic rotation holding black physioball 1 x 12 Thoracic extension holding black physioball 1 x 12 Seated raising black physioball overhead Mechanical cervical traction stepped increase to 18# x 10 min MHP applied during 10 min traction.   [facility] Adult PT Treatment:                                                DATE: 09/15/23  Therex: 3# chest press - 2x8 Horizontal abd with shoulder flexion in supine RTB 2x8.  Horizontal abd in supine with OH reach - RTB - 2x8  Manual Therapy  Sub occipital release TPR R LS, pt in supine  Modalities Mechanical cervical traction stepped increase to 18# x 10 min MHP applied in for 10 min during traction  [facility] Adult PT Treatment:  DATE: 09/01/23 UBE L3 x 4 min (FWD/BWD x 2 min each) L upper trap stretch 2 x 30 sec  MTPR of the R upper trap / Levator Supine scapular ER with retraction 2 x 12 Bil scapular protraction in  supine 1 x 12 Pushing out on pilates ring with shoulder flexion in supine 1 x 12.  Discuss posture and MTPR techniques at home.  Modalities Mechanical cervical traction stepped increase to 18# x 10 min MHP applied in sitting for 10 min during traction  08/25/23 TherEx: UBE 2'/2' fwd and backward for warm up while taking subjective Rows 2x10 GTB - 3 sec hold Seated 90 90 OH press - 2# Standing bicep curls 2x10; 3# DB Bil ER - RTB  Modalities Mechanical cervical traction stepped increase to 18# x 10 min MHP applied in sitting for 10 min following visit   08/19/23 TherEx: UBE: level 1: 3 minutes each direction With e-stim: Scapular retraction x 10 reps; 3 sec hold Bil ER with scap retraction x 10 reps; 3 sec hold Seated bicep curls 2x10; 2#  Seated shoulder flexion 2x10 bil; 2# Seated cervical retraction x 10 reps  Modalities Pre-mod to Rt post shoulder x 20 min with intensity to tolerance during exercises (intensity decreased throughout session as pain increased in Rt shoulder)   PATIENT EDUCATION:  Education details: HEP, POC Person educated: Patient Education method: Programmer, multimedia, Demonstration, Verbal cues, and Handouts Education comprehension: verbalized understanding, returned demonstration, and verbal cues required  HOME EXERCISE PROGRAM: Access Code: [email or web address] URL: [email or web address] Date: 07/14/2023 Prepared by: Delon Lunger  Exercises - Supine Cervical Retraction with Towel  - 2 x daily - 7 x weekly - 10 reps - 5 seconds hold - Supine Chin Tuck  - 2 x daily - 7 x weekly - 10 reps - 5 seconds hold - Supine Cervical Rotation PROM  - 2 x daily - 7 x weekly - 5 reps - 5 second hold - Seated Upper Trapezius Stretch  - 2 x daily - 7 x weekly - 5 reps - 5 seconds hold  ASSESSMENT:  CLINICAL IMPRESSION: Latonda reports continued gradual improvement in the R shoulder but does report the pain flcutuates with today being between 5-6/10. Continued STW  along the R upper trap and surrounding musculature. Continued working on posterior chain activation and thoacic mobility. Finished session with MHP during mechanical cervical traction.   OBJECTIVE IMPAIRMENTS: decreased balance, impaired flexibility, postural dysfunction, and pain.   ACTIVITY LIMITATIONS: lifting, dressing, and reach over head  PARTICIPATION LIMITATIONS: community activity  PERSONAL FACTORS: see PMH above  are also affecting patient's functional outcome.   REHAB POTENTIAL: Good  CLINICAL DECISION MAKING: Stable/uncomplicated  EVALUATION COMPLEXITY: Low   GOALS: Goals reviewed with patient? Yes  SHORT TERM GOALS: (target date for Short term goals are 3 weeks 08/04/2023)  1.Patient will demonstrate independent use of home exercise program to maintain progress from in clinic treatments. Goal status: MET 08/05/23    LONG TERM GOALS: (target dates for all long term goals are 8 weeks  09/11/2023) updated to 10/20/2023    1. Patient will demonstrate/report pain at worst less than or equal to 2/10 to facilitate minimal limitation in daily activity secondary to pain symptoms. 8/19: 6/10 current, 9-10/10 on eval Goal status: On-going   2. Patient will demonstrate independent use of home exercise program to facilitate ability to maintain/progress functional gains from skilled physical therapy services. 8/19: 50-70% compliant depending on pian level Goal status: Ongoing   3. Patient will  demonstrate Patient specific functional scale avg > or = 3.5 to indicate reduced disability due to condition.  Goal status: MET   4.  Patient will demonstrate cervical extension to >/= 12 degrees s symptoms to facilitate usual head movements for daily activity including driving, self care.   Goal status: MET   5.  Pt will improve bilateral cervical rotation to >/= 45 degrees for improvements in functional movements.   8/19: see chart Goal status: Ongoing     PLAN:  PT FREQUENCY:  1-2x/week  PT DURATION: 6 weeks  Can include 02853- PT Re-evaluation, 97110-Therapeutic exercises, 97530- Therapeutic activity, 97112- Neuromuscular re-education, 97535- Self Care, 97140- Manual therapy, [phone]- Gait training, [phone]- Orthotic Fit/training, [phone]- Canalith repositioning, [id number]- Aquatic Therapy, [phone]- Electrical stimulation (unattended), [id number] Physical performance testing, 97016- Vasopneumatic device, [id number]- Ultrasound, [id number]- Traction (mechanical), [id number]- Ionotophoresis 4mg /ml Dexamethasone ,  [id number] - Needle insertion w/o injection 1 or 2 muscles, 20561 - Needle insertion w/o injection 3 or more muscles.   Patient/Family education, Balance training, Stair training, Taping, Dry Needling, Joint mobilization, Joint manipulation, Spinal manipulation, Spinal mobilization, Scar mobilization, Vestibular training, Visual/preceptual remediation/compensation, DME instructions, Cryotherapy, and Moist heat.  All performed as medically necessary.  All included unless contraindicated  PLAN FOR NEXT SESSION: strengthening as able,  continue with cervical ROM, postural exercises as tolerated Response to Traction with increased pull as tolerated.  Chapin Arduini PT, DPT, LAT, ATC  09/24/23  3:14 PM

## 2023-09-29 ENCOUNTER — Encounter: Payer: Self-pay | Admitting: Physical Therapy

## 2023-09-29 ENCOUNTER — Ambulatory Visit: Admitting: Physical Therapy

## 2023-09-29 DIAGNOSIS — M542 Cervicalgia: Secondary | ICD-10-CM

## 2023-09-29 DIAGNOSIS — M6281 Muscle weakness (generalized): Secondary | ICD-10-CM

## 2023-09-29 DIAGNOSIS — R2681 Unsteadiness on feet: Secondary | ICD-10-CM

## 2023-09-29 NOTE — Therapy (Signed)
 PT DAILY NOTE    Patient Name: Olivia Evans MRN: [id number] DOB:[birth year], 89 y.o., female Today's Date: 09/29/2023  END OF SESSION:  PT End of Session - 09/29/23 1324     Visit Number 13    Number of Visits 20    Date for PT Re-Evaluation 10/20/23    Authorization Type [employer] $10 copay    Progress Note Due on Visit 20    PT Start Time 1320    PT Stop Time 1418    PT Time Calculation (min) 58 min    Activity Tolerance Patient tolerated treatment well    Behavior During Therapy [facility] for tasks assessed/performed               Past Medical History:  Diagnosis Date   Arthritis KNEES   Bladder pain    Chronic constipation    Diverticulosis 2013   Dr Aneita   Dyslipidemia    Heart murmur    Hemorrhoids    History of adenomatous polyp of colon    2003 -- VILLOUS   History of breast cancer 2001---RIGHT BREAST CANCER   S/P PARTIAL MASTECTOMY AND RADIATION--  NO RECURRENCE   History of palpitations    Incomplete right bundle branch block (RBBB)    Interstitial cystitis    Dr McDiarmid   Lower urinary tract symptoms (LUTS)    Normal cardiac stress test    2000  PER MD NOTE   Osteopenia    Wears dentures    full -upper/  partial lower   Wears glasses    Past Surgical History:  Procedure Laterality Date   BENIGN TUMOR REMOVED  1960'S   SECOND RIB   CATARACT EXTRACTION W/ INTRAOCULAR LENS  IMPLANT, BILATERAL  feb 2016   CYSTO WITH HYDRODISTENSION  07/29/2011   Procedure: CYSTOSCOPY/HYDRODISTENSION;  Surgeon: Glendia DELENA Elizabeth, MD;  Location: [facility];  Service: Urology;  Laterality: N/A;  with fulguration of bladder ulcer   CYSTO WITH HYDRODISTENSION N/A 08/28/2014   Procedure: CYSTO/HYDRODISTENSION, FULGURATION OF ULCERS;  Surgeon: Glendia Elizabeth, MD;  Location: [facility];  Service: Urology;  Laterality: N/A;   CYSTO/  HYDRODISTENTION/  INSTILLATION THERAPY  x2  01-18- and 11-15- 2011   CYSTO/ HOD/  BLADDER BX'S/   FULGERATION HUNNER ULCER'S /  INSTILLATION THERAPY  10-01-2010   PARTIAL MASTECTOMY W/ SLN DISSECTION Right 12-19-1999   ROTATOR CUFF REPAIR     TUBAL LIGATION     VAGINAL HYSTERECTOMY  1970'S   WITH APPENDECTOMY   VARICOSE VEIN SURGERY     Patient Active Problem List   Diagnosis Date Noted   Pain and swelling of lower extremity, right 01/16/2022   Murmur 11/06/2021   Myelodysplasia (myelodysplastic syndrome) (HCC) 11/05/2021   Neck pain 10/07/2021   Occipital neuralgia of left side 10/04/2021   Decreased hearing of both ears 03/08/2021   Bicytopenia 10/23/2020   History of breast cancer on right 10/16/2020   Anemia 11/28/2018   Prediabetes 10/13/2018   Bilateral leg edema 09/12/2017   Bilateral primary osteoarthritis of knee 12/22/2016   SOB (shortness of breath) on exertion 10/15/2016   Osteoporosis 03/26/2016   Diverticulosis of colon without hemorrhage 11/15/2013   Vitamin D  deficiency 06/30/2012   INTERSTITIAL CYSTITIS 10/09/2009   Constipation 12/13/2007   History of colonic polyps 12/13/2007   SYNCOPE 06/24/2006    PCP: Geofm Glade PARAS, MD   REFERRING PROVIDER: Geofm Glade PARAS, MD   REFERRING DIAG:  Diagnosis  [phone] (  ICD-10-CM) - Chronic neck pain    THERAPY DIAG:  Cervicalgia  Unsteadiness on feet  Muscle weakness (generalized)  Rationale for Evaluation and Treatment: Rehabilitation  ONSET DATE: Years  SUBJECTIVE:                                                                                                                                                                                                         SUBJECTIVE STATEMENT: I am doing the same or maybe a little better.  PERTINENT HISTORY:  See PMH  PAIN:  NPRS scale: 5/10 Pain location: posterior neck Pain description: achy Aggravating factors: extension, rotation Relieving factors: resting  PRECAUTIONS: Other: h/o breast cancer    WEIGHT BEARING RESTRICTIONS:  No  FALLS:  Has patient fallen in last 6 months? No  LIVING ENVIRONMENT: Lives with: lives with their family and lives alone Lives in: House/apartment Stairs: Yes: External: 3 steps; on right going up Has following equipment at home: Single point cane and Grab bars  OCCUPATION: retired  PLOF: Independent  PATIENT GOALS: Be able to move my neck without pain  Next MD visit:  OBJECTIVE:   DIAGNOSTIC FINDINGS:  2023:  2 view radiographs of her cervical spine demonstrate overall  well-maintained alignment.  She does have degenerative changes especially  at C4-5 C5-6 C6-7.  Do not see any acute fractures or other osseous  injuries   PATIENT SURVEYS: Patient-Specific Activity Scoring Scheme  0 represents "unable to perform." 10 represents "able to perform at prior level. 0 1 2 3 4 5 6 7 8 9  10 (Date and Score)   Activity Eval  07/14/23 08/03/23  8/19  1. Looking up  1 1  3   2. Turning head 2  4 4   Score 1.5 2.5 3.5   Total score = sum of the activity scores/number of activities Minimum detectable change (90%CI) for average score = 2 points Minimum detectable change (90%CI) for single activity score = 3 points   COGNITION: Overall cognitive status: Within functional limits for tasks assessed  SENSATION: WFL  POSTURE:  rounded shoulders, forward head, and decreased lumbar lordosis  PALPATION: TTP: cervical paraspinals and bilateral upper traps   CERVICAL ROM:   ROM AROM (deg) Eval 07/14/23 8/19  Flexion 40   Extension 8 25  Right lateral flexion 28 Resting position is 8 tilted to Rt   Left lateral flexion 12, c pain   Right rotation 34 42  Left rotation 36 38   (Blank rows = not tested)  UPPER EXTREMITY ROM:   ROM Right Eval 07/14/23  Left Eval 07/14/23  Shoulder flexion 160 154  Shoulder extension 54 56  Shoulder abduction 155 155   (Blank rows = not tested)  UPPER EXTREMITY MMT:  MMT Right Eval 07/14/23 Left eval  Shoulder flexion  3.6 ppsi 4.4 ppsi  Shoulder extension    Shoulder abduction    Shoulder adduction    Shoulder extension    Shoulder internal rotation    Shoulder external rotation    Middle trapezius    Lower trapezius    Elbow flexion    Elbow extension    Wrist flexion    Wrist extension    Wrist ulnar deviation    Wrist radial deviation    Wrist pronation    Wrist supination    Grip strength     (Blank rows = not tested)  FUNCTIONAL TESTS:  07/14/23 Timed up and go (TUG): 17.41, c UE support                                                                                                                                                                                TODAY'S TREATMENT [facility] Adult PT Treatment:                                                DATE: 09/29/23 UBE L3 x 4 min Backward only  MTPR along the R upper trap/ levator scapulae Pulleys flexion 1 x 2 min  Rows 2 x 12 with GTB Bil overhead lift 2 x 10 with 6# Reviewed anatomy of the spine and benefits of posture Mechanical cervical traction stepped increase to 18# x 10 min MHP applied during 10 min traction.   [facility] Adult PT Treatment:                                                DATE: 09/24/23 MTPR along the R upper trap/ levator scapulae, rhomboids and cervical paraspinals IASTM along the upper trap/ levator s R upper trap stretch UBE L3 FWD 2 min, BWD x 3 min  Scapular retraction with ER 2 x 10  Thoracic rotation holding black physioball 1 x 12 Thoracic extension holding black physioball 1 x 12 Seated raising black physioball overhead Mechanical cervical traction stepped increase to 18# x 10 min MHP applied during 10 min traction.   [facility] Adult PT Treatment:  DATE: 09/15/23  Therex: 3# chest press - 2x8 Horizontal abd with shoulder flexion in supine RTB 2x8.  Horizontal abd in supine with OH reach - RTB - 2x8  Manual Therapy  Sub occipital release TPR R LS, pt in  supine  Modalities Mechanical cervical traction stepped increase to 18# x 10 min MHP applied in for 10 min during traction  [facility] Adult PT Treatment:                                                DATE: 09/01/23 UBE L3 x 4 min (FWD/BWD x 2 min each) L upper trap stretch 2 x 30 sec  MTPR of the R upper trap / Levator Supine scapular ER with retraction 2 x 12 Bil scapular protraction in supine 1 x 12 Pushing out on pilates ring with shoulder flexion in supine 1 x 12.  Discuss posture and MTPR techniques at home.  Modalities Mechanical cervical traction stepped increase to 18# x 10 min MHP applied in sitting for 10 min during traction  08/25/23 TherEx: UBE 2'/2' fwd and backward for warm up while taking subjective Rows 2x10 GTB - 3 sec hold Seated 90 90 OH press - 2# Standing bicep curls 2x10; 3# DB Bil ER - RTB  Modalities Mechanical cervical traction stepped increase to 18# x 10 min MHP applied in sitting for 10 min following visit    PATIENT EDUCATION:  Education details: HEP, POC Person educated: Patient Education method: Programmer, multimedia, Demonstration, Verbal cues, and Handouts Education comprehension: verbalized understanding, returned demonstration, and verbal cues required  HOME EXERCISE PROGRAM: Access Code: [email or web address] URL: [email or web address] Date: 07/14/2023 Prepared by: Delon Lunger  Exercises - Supine Cervical Retraction with Towel  - 2 x daily - 7 x weekly - 10 reps - 5 seconds hold - Supine Chin Tuck  - 2 x daily - 7 x weekly - 10 reps - 5 seconds hold - Supine Cervical Rotation PROM  - 2 x daily - 7 x weekly - 5 reps - 5 second hold - Seated Upper Trapezius Stretch  - 2 x daily - 7 x weekly - 5 reps - 5 seconds hold  ASSESSMENT:  CLINICAL IMPRESSION: Deloris arrives to PT today reporting she has felt better over the last week and hasn't really had any soreness/ pain. Continued working on gross shoulder strengthening and core activation while  seated avoiding use of a backrest. Time was taken to review anatomy and posture with sleeping to prevent / reduce the joints from closing down. Continued MHP concordant with mechanical traction. If pt continues to do well anticipate potential discharge in the next few visits.   OBJECTIVE IMPAIRMENTS: decreased balance, impaired flexibility, postural dysfunction, and pain.   ACTIVITY LIMITATIONS: lifting, dressing, and reach over head  PARTICIPATION LIMITATIONS: community activity  PERSONAL FACTORS: see PMH above  are also affecting patient's functional outcome.   REHAB POTENTIAL: Good  CLINICAL DECISION MAKING: Stable/uncomplicated  EVALUATION COMPLEXITY: Low   GOALS: Goals reviewed with patient? Yes  SHORT TERM GOALS: (target date for Short term goals are 3 weeks 08/04/2023)  1.Patient will demonstrate independent use of home exercise program to maintain progress from in clinic treatments. Goal status: MET 08/05/23    LONG TERM GOALS: (target dates for all long term goals are 8 weeks  09/11/2023) updated to 10/20/2023    1. Patient will  demonstrate/report pain at worst less than or equal to 2/10 to facilitate minimal limitation in daily activity secondary to pain symptoms. 8/19: 6/10 current, 9-10/10 on eval Goal status: On-going   2. Patient will demonstrate independent use of home exercise program to facilitate ability to maintain/progress functional gains from skilled physical therapy services. 8/19: 50-70% compliant depending on pian level Goal status: Ongoing   3. Patient will demonstrate Patient specific functional scale avg > or = 3.5 to indicate reduced disability due to condition.  Goal status: MET   4.  Patient will demonstrate cervical extension to >/= 12 degrees s symptoms to facilitate usual head movements for daily activity including driving, self care.   Goal status: MET   5.  Pt will improve bilateral cervical rotation to >/= 45 degrees for improvements in  functional movements.   8/19: see chart Goal status: Ongoing     PLAN:  PT FREQUENCY: 1-2x/week  PT DURATION: 6 weeks  Can include 02853- PT Re-evaluation, 97110-Therapeutic exercises, 97530- Therapeutic activity, 97112- Neuromuscular re-education, 97535- Self Care, 97140- Manual therapy, [phone]- Gait training, [phone]- Orthotic Fit/training, [phone]- Canalith repositioning, [id number]- Aquatic Therapy, [phone]- Electrical stimulation (unattended), [id number] Physical performance testing, 97016- Vasopneumatic device, [id number]- Ultrasound, [id number]- Traction (mechanical), [id number]- Ionotophoresis 4mg /ml Dexamethasone ,  [id number] - Needle insertion w/o injection 1 or 2 muscles, 20561 - Needle insertion w/o injection 3 or more muscles.   Patient/Family education, Balance training, Stair training, Taping, Dry Needling, Joint mobilization, Joint manipulation, Spinal manipulation, Spinal mobilization, Scar mobilization, Vestibular training, Visual/preceptual remediation/compensation, DME instructions, Cryotherapy, and Moist heat.  All performed as medically necessary.  All included unless contraindicated  PLAN FOR NEXT SESSION: strengthening as able,  continue with cervical ROM, postural exercises as tolerated Response to Traction with increased pull as tolerated. Did pt bring her home theracane to practice in the clinic.   Sanchez Hemmer PT, DPT, LAT, ATC  09/29/23  2:15 PM

## 2023-10-05 NOTE — Therapy (Signed)
 PT DAILY NOTE    Patient Name: Olivia Evans MRN: [id number] DOB:Jul 20, 1926, 89 y.o., female Today's Date: 10/06/2023  END OF SESSION:  PT End of Session - 10/06/23 1546     Visit Number 14    Number of Visits 20    Date for PT Re-Evaluation 10/20/23    Authorization Type [employer] $10 copay    PT Start Time 1420    PT Stop Time 1511    PT Time Calculation (min) 51 min    Activity Tolerance Patient tolerated treatment well    Behavior During Therapy [facility] for tasks assessed/performed                Past Medical History:  Diagnosis Date   Arthritis KNEES   Bladder pain    Chronic constipation    Diverticulosis 2013   Dr Aneita   Dyslipidemia    Heart murmur    Hemorrhoids    History of adenomatous polyp of colon    2003 -- VILLOUS   History of breast cancer 2001---RIGHT BREAST CANCER   S/P PARTIAL MASTECTOMY AND RADIATION--  NO RECURRENCE   History of palpitations    Incomplete right bundle branch block (RBBB)    Interstitial cystitis    Dr McDiarmid   Lower urinary tract symptoms (LUTS)    Normal cardiac stress test    2000  PER MD NOTE   Osteopenia    Wears dentures    full -upper/  partial lower   Wears glasses    Past Surgical History:  Procedure Laterality Date   BENIGN TUMOR REMOVED  1960'S   SECOND RIB   CATARACT EXTRACTION W/ INTRAOCULAR LENS  IMPLANT, BILATERAL  feb 2016   CYSTO WITH HYDRODISTENSION  07/29/2011   Procedure: CYSTOSCOPY/HYDRODISTENSION;  Surgeon: Glendia DELENA Elizabeth, MD;  Location: [facility];  Service: Urology;  Laterality: N/A;  with fulguration of bladder ulcer   CYSTO WITH HYDRODISTENSION N/A 08/28/2014   Procedure: CYSTO/HYDRODISTENSION, FULGURATION OF ULCERS;  Surgeon: Glendia Elizabeth, MD;  Location: [facility];  Service: Urology;  Laterality: N/A;   CYSTO/  HYDRODISTENTION/  INSTILLATION THERAPY  x2  01-18- and 11-15- 2011   CYSTO/ HOD/  BLADDER BX'S/  FULGERATION HUNNER ULCER'S /   INSTILLATION THERAPY  10-01-2010   PARTIAL MASTECTOMY W/ SLN DISSECTION Right 12-19-1999   ROTATOR CUFF REPAIR     TUBAL LIGATION     VAGINAL HYSTERECTOMY  1970'S   WITH APPENDECTOMY   VARICOSE VEIN SURGERY     Patient Active Problem List   Diagnosis Date Noted   Pain and swelling of lower extremity, right 01/16/2022   Murmur 11/06/2021   Myelodysplasia (myelodysplastic syndrome) (HCC) 11/05/2021   Neck pain 10/07/2021   Occipital neuralgia of left side 10/04/2021   Decreased hearing of both ears 03/08/2021   Bicytopenia 10/23/2020   History of breast cancer on right 10/16/2020   Anemia 11/28/2018   Prediabetes 10/13/2018   Bilateral leg edema 09/12/2017   Bilateral primary osteoarthritis of knee 12/22/2016   SOB (shortness of breath) on exertion 10/15/2016   Osteoporosis 03/26/2016   Diverticulosis of colon without hemorrhage 11/15/2013   Vitamin D  deficiency 06/30/2012   INTERSTITIAL CYSTITIS 10/09/2009   Constipation 12/13/2007   History of colonic polyps 12/13/2007   SYNCOPE 06/24/2006    PCP: Geofm Glade PARAS, MD   REFERRING PROVIDER: Geofm Glade PARAS, MD   REFERRING DIAG:  Diagnosis  M54.2,G89.29 (ICD-10-CM) - Chronic neck pain  THERAPY DIAG:  Cervicalgia  Unsteadiness on feet  Muscle weakness (generalized)  Rationale for Evaluation and Treatment: Rehabilitation  ONSET DATE: Years  SUBJECTIVE:                                                                                                                                                                                                         SUBJECTIVE STATEMENT: I am hurting in a different spot today its at about an 8/10.  PERTINENT HISTORY:  See PMH  PAIN:  NPRS scale: 8/10 Pain location: posterior neck Pain description: achy Aggravating factors: extension, rotation Relieving factors: resting  PRECAUTIONS: Other: h/o breast cancer    WEIGHT BEARING RESTRICTIONS: No  FALLS:  Has patient  fallen in last 6 months? No  LIVING ENVIRONMENT: Lives with: lives with their family and lives alone Lives in: House/apartment Stairs: Yes: External: 3 steps; on right going up Has following equipment at home: Single point cane and Grab bars  OCCUPATION: retired  PLOF: Independent  PATIENT GOALS: Be able to move my neck without pain  Next MD visit:  OBJECTIVE:   DIAGNOSTIC FINDINGS:  2023:  2 view radiographs of her cervical spine demonstrate overall  well-maintained alignment.  She does have degenerative changes especially  at C4-5 C5-6 C6-7.  Do not see any acute fractures or other osseous  injuries   PATIENT SURVEYS: Patient-Specific Activity Scoring Scheme  0 represents "unable to perform." 10 represents "able to perform at prior level. 0 1 2 3 4 5 6 7 8 9  10 (Date and Score)   Activity Eval  07/14/23 08/03/23  8/19  1. Looking up  1 1  3   2. Turning head 2  4 4   Score 1.5 2.5 3.5   Total score = sum of the activity scores/number of activities Minimum detectable change (90%CI) for average score = 2 points Minimum detectable change (90%CI) for single activity score = 3 points   COGNITION: Overall cognitive status: Within functional limits for tasks assessed  SENSATION: WFL  POSTURE:  rounded shoulders, forward head, and decreased lumbar lordosis  PALPATION: TTP: cervical paraspinals and bilateral upper traps   CERVICAL ROM:   ROM AROM (deg) Eval 07/14/23 8/19  Flexion 40   Extension 8 25  Right lateral flexion 28 Resting position is 8 tilted to Rt   Left lateral flexion 12, c pain   Right rotation 34 42  Left rotation 36 38   (Blank rows = not tested)  UPPER EXTREMITY ROM:   ROM Right Eval 07/14/23 Left Eval 07/14/23  Shoulder  flexion 160 154  Shoulder extension 54 56  Shoulder abduction 155 155   (Blank rows = not tested)  UPPER EXTREMITY MMT:  MMT Right Eval 07/14/23 Left eval  Shoulder flexion 3.6 ppsi 4.4 ppsi  Shoulder  extension    Shoulder abduction    Shoulder adduction    Shoulder extension    Shoulder internal rotation    Shoulder external rotation    Middle trapezius    Lower trapezius    Elbow flexion    Elbow extension    Wrist flexion    Wrist extension    Wrist ulnar deviation    Wrist radial deviation    Wrist pronation    Wrist supination    Grip strength     (Blank rows = not tested)  FUNCTIONAL TESTS:  07/14/23 Timed up and go (TUG): 17.41, c UE support                                                                                                                                                                                TODAY'S TREATMENT [facility] Adult PT Treatment:                                                DATE: 10/06/23 MTPR along the R upper trap/ rhomboid and levator scapulae Time taken to review use of self trigger point release tools Scapular retraction with ER 2 x 15 RTB Rows (double banded RTB) 2 x 15 Sustaints horiztonal abd with bil shoulder flexion (RTB looped around wrists) 1 x 10 Bil UE ball press 1 x 20  Reviewed sleeping posture/ positioning.  Mechanical cervical traction stepped increase to 18# x 10 min MHP applied during 10 min traction.   [facility] Adult PT Treatment:                                                DATE: 09/29/23 UBE L3 x 4 min Backward only  MTPR along the R upper trap/ levator scapulae Pulleys flexion 1 x 2 min  Rows 2 x 12 with GTB Bil overhead lift 2 x 10 with 6# Reviewed anatomy of the spine and benefits of posture Mechanical cervical traction stepped increase to 19# x MHP applied during 10 min traction.   [facility] Adult PT Treatment:  DATE: 09/24/23 MTPR along the R upper trap/ levator scapulae, rhomboids and cervical paraspinals IASTM along the upper trap/ levator s R upper trap stretch UBE L3 FWD 2 min, BWD x 3 min  Scapular retraction with ER 2 x 10  Thoracic rotation holding black  physioball 1 x 12 Thoracic extension holding black physioball 1 x 12 Seated raising black physioball overhead Mechanical cervical traction stepped increase to 18# x 10 min MHP applied during 10 min traction.   [facility] Adult PT Treatment:                                                DATE: 09/15/23 Therex: 3# chest press - 2x8 Horizontal abd with shoulder flexion in supine RTB 2x8.  Horizontal abd in supine with OH reach - RTB - 2x8  Manual Therapy  Sub occipital release TPR R LS, pt in supine  Modalities Mechanical cervical traction stepped increase to 18# x 10 min MHP applied in for 10 min during traction   PATIENT EDUCATION:  Education details: HEP, POC Person educated: Patient Education method: Programmer, multimedia, Demonstration, Verbal cues, and Handouts Education comprehension: verbalized understanding, returned demonstration, and verbal cues required  HOME EXERCISE PROGRAM: Access Code: [email or web address] URL: [email or web address] Date: 07/14/2023 Prepared by: Delon Lunger  Exercises - Supine Cervical Retraction with Towel  - 2 x daily - 7 x weekly - 10 reps - 5 seconds hold - Supine Chin Tuck  - 2 x daily - 7 x weekly - 10 reps - 5 seconds hold - Supine Cervical Rotation PROM  - 2 x daily - 7 x weekly - 5 reps - 5 second hold - Seated Upper Trapezius Stretch  - 2 x daily - 7 x weekly - 5 reps - 5 seconds hold  ASSESSMENT:  CLINICAL IMPRESSION: Omer Arrives to session noting increased pain today at 8/10 and noted it could be attributed to the position she was sleeping in. Performed MTPR and utilized patients tools that she brought to session to practice/ train for home use/ treatment. Continued working on posterior chain and core activation. Discussed posture with sleeping and benefits of appropriate propping and pillow use. End of session she noted feeling better than when she arrived.   OBJECTIVE IMPAIRMENTS: decreased balance, impaired flexibility, postural  dysfunction, and pain.   ACTIVITY LIMITATIONS: lifting, dressing, and reach over head  PARTICIPATION LIMITATIONS: community activity  PERSONAL FACTORS: see PMH above  are also affecting patient's functional outcome.   REHAB POTENTIAL: Good  CLINICAL DECISION MAKING: Stable/uncomplicated  EVALUATION COMPLEXITY: Low   GOALS: Goals reviewed with patient? Yes  SHORT TERM GOALS: (target date for Short term goals are 3 weeks 08/04/2023)  1.Patient will demonstrate independent use of home exercise program to maintain progress from in clinic treatments. Goal status: MET 08/05/23    LONG TERM GOALS: (target dates for all long term goals are 8 weeks  09/11/2023) updated to 10/20/2023    1. Patient will demonstrate/report pain at worst less than or equal to 2/10 to facilitate minimal limitation in daily activity secondary to pain symptoms. 8/19: 6/10 current, 9-10/10 on eval Goal status: On-going   2. Patient will demonstrate independent use of home exercise program to facilitate ability to maintain/progress functional gains from skilled physical therapy services. 8/19: 50-70% compliant depending on pian level Goal status: Ongoing   3. Patient will demonstrate Patient  specific functional scale avg > or = 3.5 to indicate reduced disability due to condition.  Goal status: MET   4.  Patient will demonstrate cervical extension to >/= 12 degrees s symptoms to facilitate usual head movements for daily activity including driving, self care.   Goal status: MET   5.  Pt will improve bilateral cervical rotation to >/= 45 degrees for improvements in functional movements.   8/19: see chart Goal status: Ongoing     PLAN:  PT FREQUENCY: 1-2x/week  PT DURATION: 6 weeks  Can include 02853- PT Re-evaluation, 97110-Therapeutic exercises, 97530- Therapeutic activity, 97112- Neuromuscular re-education, 97535- Self Care, 97140- Manual therapy, [phone]- Gait training, [phone]- Orthotic Fit/training,  [phone]- Canalith repositioning, [id number]- Aquatic Therapy, [phone]- Electrical stimulation (unattended), [id number] Physical performance testing, 97016- Vasopneumatic device, [id number]- Ultrasound, [id number]- Traction (mechanical), [id number]- Ionotophoresis 4mg /ml Dexamethasone ,  79439 - Needle insertion w/o injection 1 or 2 muscles, 20561 - Needle insertion w/o injection 3 or more muscles.   Patient/Family education, Balance training, Stair training, Taping, Dry Needling, Joint mobilization, Joint manipulation, Spinal manipulation, Spinal mobilization, Scar mobilization, Vestibular training, Visual/preceptual remediation/compensation, DME instructions, Cryotherapy, and Moist heat.  All performed as medically necessary.  All included unless contraindicated  PLAN FOR NEXT SESSION: strengthening as able,  continue with cervical ROM, postural exercises as tolerated Response to Traction with increased pull as tolerated. Did pt bring her home theracane to practice in the clinic.   Yarel Rushlow PT, DPT, LAT, ATC  10/06/23  3:56 PM

## 2023-10-06 ENCOUNTER — Encounter: Payer: Self-pay | Admitting: Physical Therapy

## 2023-10-06 ENCOUNTER — Ambulatory Visit: Admitting: Physical Therapy

## 2023-10-06 DIAGNOSIS — R2681 Unsteadiness on feet: Secondary | ICD-10-CM

## 2023-10-06 DIAGNOSIS — M6281 Muscle weakness (generalized): Secondary | ICD-10-CM

## 2023-10-06 DIAGNOSIS — M542 Cervicalgia: Secondary | ICD-10-CM | POA: Diagnosis not present

## 2023-10-13 ENCOUNTER — Ambulatory Visit: Admitting: Physical Therapy

## 2023-10-13 ENCOUNTER — Encounter: Payer: Self-pay | Admitting: Physical Therapy

## 2023-10-13 DIAGNOSIS — M6281 Muscle weakness (generalized): Secondary | ICD-10-CM

## 2023-10-13 DIAGNOSIS — M542 Cervicalgia: Secondary | ICD-10-CM | POA: Diagnosis not present

## 2023-10-13 DIAGNOSIS — R2681 Unsteadiness on feet: Secondary | ICD-10-CM

## 2023-10-13 NOTE — Therapy (Unsigned)
 PT DAILY NOTE    Patient Name: Olivia Evans MRN: [id number] DOB:Oct 07, 1926, 89 y.o., female Today's Date: 10/13/2023  END OF SESSION:  PT End of Session - 10/13/23 1414     Visit Number 15    Number of Visits 20    Date for Recertification  10/20/23    Authorization Type [employer] $10 copay    Progress Note Due on Visit 20    PT Start Time 1415    PT Stop Time 1456    PT Time Calculation (min) 41 min    Activity Tolerance Patient tolerated treatment well    Behavior During Therapy [facility] for tasks assessed/performed                Past Medical History:  Diagnosis Date   Arthritis KNEES   Bladder pain    Chronic constipation    Diverticulosis 2013   Dr Aneita   Dyslipidemia    Heart murmur    Hemorrhoids    History of adenomatous polyp of colon    2003 -- VILLOUS   History of breast cancer 2001---RIGHT BREAST CANCER   S/P PARTIAL MASTECTOMY AND RADIATION--  NO RECURRENCE   History of palpitations    Incomplete right bundle branch block (RBBB)    Interstitial cystitis    Dr McDiarmid   Lower urinary tract symptoms (LUTS)    Normal cardiac stress test    2000  PER MD NOTE   Osteopenia    Wears dentures    full -upper/  partial lower   Wears glasses    Past Surgical History:  Procedure Laterality Date   BENIGN TUMOR REMOVED  1960'S   SECOND RIB   CATARACT EXTRACTION W/ INTRAOCULAR LENS  IMPLANT, BILATERAL  feb 2016   CYSTO WITH HYDRODISTENSION  07/29/2011   Procedure: CYSTOSCOPY/HYDRODISTENSION;  Surgeon: Glendia DELENA Elizabeth, MD;  Location: [facility];  Service: Urology;  Laterality: N/A;  with fulguration of bladder ulcer   CYSTO WITH HYDRODISTENSION N/A 08/28/2014   Procedure: CYSTO/HYDRODISTENSION, FULGURATION OF ULCERS;  Surgeon: Glendia Elizabeth, MD;  Location: [facility];  Service: Urology;  Laterality: N/A;   CYSTO/  HYDRODISTENTION/  INSTILLATION THERAPY  x2  01-18- and 11-15- 2011   CYSTO/ HOD/  BLADDER  BX'S/  FULGERATION HUNNER ULCER'S /  INSTILLATION THERAPY  10-01-2010   PARTIAL MASTECTOMY W/ SLN DISSECTION Right 12-19-1999   ROTATOR CUFF REPAIR     TUBAL LIGATION     VAGINAL HYSTERECTOMY  1970'S   WITH APPENDECTOMY   VARICOSE VEIN SURGERY     Patient Active Problem List   Diagnosis Date Noted   Pain and swelling of lower extremity, right 01/16/2022   Murmur 11/06/2021   Myelodysplasia (myelodysplastic syndrome) (HCC) 11/05/2021   Neck pain 10/07/2021   Occipital neuralgia of left side 10/04/2021   Decreased hearing of both ears 03/08/2021   Bicytopenia 10/23/2020   History of breast cancer on right 10/16/2020   Anemia 11/28/2018   Prediabetes 10/13/2018   Bilateral leg edema 09/12/2017   Bilateral primary osteoarthritis of knee 12/22/2016   SOB (shortness of breath) on exertion 10/15/2016   Osteoporosis 03/26/2016   Diverticulosis of colon without hemorrhage 11/15/2013   Vitamin D  deficiency 06/30/2012   INTERSTITIAL CYSTITIS 10/09/2009   Constipation 12/13/2007   History of colonic polyps 12/13/2007   SYNCOPE 06/24/2006    PCP: Geofm Glade PARAS, MD   REFERRING PROVIDER: Geofm Glade PARAS, MD   REFERRING DIAG:  Diagnosis  M54.2,G89.29 (ICD-10-CM) - Chronic neck pain    THERAPY DIAG:  Cervicalgia  Unsteadiness on feet  Muscle weakness (generalized)  Rationale for Evaluation and Treatment: Rehabilitation  ONSET DATE: Years  SUBJECTIVE:                                                                                                                                                                                                         SUBJECTIVE STATEMENT: My pain is up and down.    PERTINENT HISTORY:  See PMH  PAIN:  NPRS scale: 7-8/10 Pain location: posterior neck Pain description: achy Aggravating factors: extension, rotation Relieving factors: resting  PRECAUTIONS: Other: h/o breast cancer    WEIGHT BEARING RESTRICTIONS: No  FALLS:  Has  patient fallen in last 6 months? No  LIVING ENVIRONMENT: Lives with: lives with their family and lives alone Lives in: House/apartment Stairs: Yes: External: 3 steps; on right going up Has following equipment at home: Single point cane and Grab bars  OCCUPATION: retired  PLOF: Independent  PATIENT GOALS: Be able to move my neck without pain  Next MD visit:  OBJECTIVE:   DIAGNOSTIC FINDINGS:  2023:  2 view radiographs of her cervical spine demonstrate overall  well-maintained alignment.  She does have degenerative changes especially  at C4-5 C5-6 C6-7.  Do not see any acute fractures or other osseous  injuries   PATIENT SURVEYS: Patient-Specific Activity Scoring Scheme  0 represents "unable to perform." 10 represents "able to perform at prior level. 0 1 2 3 4 5 6 7 8 9  10 (Date and Score)   Activity Eval  07/14/23 08/03/23  8/19  1. Looking up  1 1  3   2. Turning head 2  4 4   Score 1.5 2.5 3.5   Total score = sum of the activity scores/number of activities Minimum detectable change (90%CI) for average score = 2 points Minimum detectable change (90%CI) for single activity score = 3 points   COGNITION: Overall cognitive status: Within functional limits for tasks assessed  SENSATION: WFL  POSTURE:  rounded shoulders, forward head, and decreased lumbar lordosis  PALPATION: TTP: cervical paraspinals and bilateral upper traps   CERVICAL ROM:   ROM AROM (deg) Eval 07/14/23 8/19   Flexion 40    Extension 8 25   Right lateral flexion 28 Resting position is 8 tilted to Rt    Left lateral flexion 12, c pain    Right rotation 34 42 50  Left rotation 36 38 45   (Blank rows = not tested)  UPPER EXTREMITY ROM:  ROM Right Eval 07/14/23 Left Eval 07/14/23  Shoulder flexion 160 154  Shoulder extension 54 56  Shoulder abduction 155 155   (Blank rows = not tested)  UPPER EXTREMITY MMT:  MMT Right Eval 07/14/23 Left eval  Shoulder flexion 3.6 ppsi  4.4 ppsi  Shoulder extension    Shoulder abduction    Shoulder adduction    Shoulder extension    Shoulder internal rotation    Shoulder external rotation    Middle trapezius    Lower trapezius    Elbow flexion    Elbow extension    Wrist flexion    Wrist extension    Wrist ulnar deviation    Wrist radial deviation    Wrist pronation    Wrist supination    Grip strength     (Blank rows = not tested)  FUNCTIONAL TESTS:  07/14/23 Timed up and go (TUG): 17.41, c UE support                                                                                                                                                                                TODAY'S TREATMENT  [facility] Adult PT Treatment:                                                DATE: 9/23  Therapeutic Activity  collecting information for goals, checking progress, and reviewing with patient Reviewing HEP D/C planning  Modalities Mechanical cervical traction stepped increase to 20# x 10 min MHP applied in for 10 min during traction   PATIENT EDUCATION:  Education details: HEP, POC Person educated: Patient Education method: Programmer, multimedia, Demonstration, Verbal cues, and Handouts Education comprehension: verbalized understanding, returned demonstration, and verbal cues required  HOME EXERCISE PROGRAM: Access Code: [email or web address] URL: [email or web address] Date: 10/13/2023 Prepared by: Helene Gasmen  Exercises - Supine Cervical Retraction with Towel  - 2 x daily - 7 x weekly - 10 reps - 5 seconds hold - Supine Chin Tuck  - 2 x daily - 7 x weekly - 10 reps - 5 seconds hold - Seated Upper Trapezius Stretch  - 2 x daily - 7 x weekly - 5 reps - 5 seconds hold - Shoulder External Rotation and Scapular Retraction with Resistance  - 1 x daily - 7 x weekly - 3 sets - 10 reps - Standing Shoulder Row with Anchored Resistance  - 1 x daily - 7 x weekly - 3 sets - 10 reps - Shoulder extension with resistance -  Neutral  - 1 x daily - 7 x weekly -  3 sets - 10 reps - Seated Overhead Press  - 1 x daily - 7 x weekly - 3 sets - 10 reps  ASSESSMENT:  CLINICAL IMPRESSION: Upon goal re-check for D/C   OBJECTIVE IMPAIRMENTS: decreased balance, impaired flexibility, postural dysfunction, and pain.   ACTIVITY LIMITATIONS: lifting, dressing, and reach over head  PARTICIPATION LIMITATIONS: community activity  PERSONAL FACTORS: see PMH above  are also affecting patient's functional outcome.   REHAB POTENTIAL: Good  CLINICAL DECISION MAKING: Stable/uncomplicated  EVALUATION COMPLEXITY: Low   GOALS: Goals reviewed with patient? Yes  SHORT TERM GOALS: (target date for Short term goals are 3 weeks 08/04/2023)  1.Patient will demonstrate independent use of home exercise program to maintain progress from in clinic treatments. Goal status: MET 08/05/23    LONG TERM GOALS: (target dates for all long term goals are 8 weeks  09/11/2023) updated to 10/20/2023    1. Patient will demonstrate/report pain at worst less than or equal to 2/10 to facilitate minimal limitation in daily activity secondary to pain symptoms. 8/19: 6/10 current, 9-10/10 on eval 9/23: 6-8/10 Goal status: NOT MET   2. Patient will demonstrate independent use of home exercise program to facilitate ability to maintain/progress functional gains from skilled physical therapy services. 8/19: 50-70% compliant depending on pian level 9/23: when I think about it Goal status: NOT MET   3. Patient will demonstrate Patient specific functional scale avg > or = 3.5 to indicate reduced disability due to condition.  Goal status: MET   4.  Patient will demonstrate cervical extension to >/= 12 degrees s symptoms to facilitate usual head movements for daily activity including driving, self care.   Goal status: MET   5.  Pt will improve bilateral cervical rotation to >/= 45 degrees for improvements in functional movements.   8/19: see chart 9/23:   Goal status: MET     PLAN:  PT FREQUENCY: 1-2x/week  PT DURATION: 6 weeks  Can include 02853- PT Re-evaluation, 97110-Therapeutic exercises, 97530- Therapeutic activity, 97112- Neuromuscular re-education, 97535- Self Care, 97140- Manual therapy, [phone]- Gait training, [phone]- Orthotic Fit/training, [phone]- Canalith repositioning, [id number]- Aquatic Therapy, [phone]- Electrical stimulation (unattended), [id number] Physical performance testing, 97016- Vasopneumatic device, [id number]- Ultrasound, [id number]- Traction (mechanical), [id number]- Ionotophoresis 4mg /ml Dexamethasone ,  79439 - Needle insertion w/o injection 1 or 2 muscles, 20561 - Needle insertion w/o injection 3 or more muscles.   Patient/Family education, Balance training, Stair training, Taping, Dry Needling, Joint mobilization, Joint manipulation, Spinal manipulation, Spinal mobilization, Scar mobilization, Vestibular training, Visual/preceptual remediation/compensation, DME instructions, Cryotherapy, and Moist heat.  All performed as medically necessary.  All included unless contraindicated  PLAN FOR NEXT SESSION: strengthening as able,  continue with cervical ROM, postural exercises as tolerated Response to Traction with increased pull as tolerated. Did pt bring her home theracane to practice in the clinic.   Xaiver Roskelley E Amari Zagal PT 10/13/23  2:14 PM

## 2023-11-09 ENCOUNTER — Encounter (HOSPITAL_COMMUNITY): Payer: Self-pay

## 2023-11-09 ENCOUNTER — Other Ambulatory Visit: Payer: Self-pay

## 2023-11-09 ENCOUNTER — Emergency Department (HOSPITAL_COMMUNITY)

## 2023-11-09 ENCOUNTER — Inpatient Hospital Stay (HOSPITAL_COMMUNITY)
Admission: EM | Admit: 2023-11-09 | Discharge: 2023-11-20 | DRG: 552 | Disposition: A | Discharge status: Skilled Nursing Facility | Attending: Internal Medicine | Admitting: Internal Medicine

## 2023-11-09 DIAGNOSIS — Z885 Allergy status to narcotic agent status: Secondary | ICD-10-CM

## 2023-11-09 DIAGNOSIS — W19XXXA Unspecified fall, initial encounter: Secondary | ICD-10-CM | POA: Diagnosis not present

## 2023-11-09 DIAGNOSIS — I5032 Chronic diastolic (congestive) heart failure: Secondary | ICD-10-CM | POA: Diagnosis not present

## 2023-11-09 DIAGNOSIS — R339 Retention of urine, unspecified: Secondary | ICD-10-CM

## 2023-11-09 DIAGNOSIS — Z66 Do not resuscitate: Secondary | ICD-10-CM | POA: Diagnosis present

## 2023-11-09 DIAGNOSIS — Z79899 Other long term (current) drug therapy: Secondary | ICD-10-CM

## 2023-11-09 DIAGNOSIS — Z9841 Cataract extraction status, right eye: Secondary | ICD-10-CM

## 2023-11-09 DIAGNOSIS — D469 Myelodysplastic syndrome, unspecified: Secondary | ICD-10-CM | POA: Diagnosis not present

## 2023-11-09 DIAGNOSIS — Z90710 Acquired absence of both cervix and uterus: Secondary | ICD-10-CM

## 2023-11-09 DIAGNOSIS — K5900 Constipation, unspecified: Secondary | ICD-10-CM | POA: Diagnosis present

## 2023-11-09 DIAGNOSIS — Z8249 Family history of ischemic heart disease and other diseases of the circulatory system: Secondary | ICD-10-CM

## 2023-11-09 DIAGNOSIS — M48061 Spinal stenosis, lumbar region without neurogenic claudication: Secondary | ICD-10-CM | POA: Diagnosis not present

## 2023-11-09 DIAGNOSIS — R0902 Hypoxemia: Secondary | ICD-10-CM | POA: Diagnosis not present

## 2023-11-09 DIAGNOSIS — M25552 Pain in left hip: Secondary | ICD-10-CM | POA: Diagnosis not present

## 2023-11-09 DIAGNOSIS — R32 Unspecified urinary incontinence: Secondary | ICD-10-CM

## 2023-11-09 DIAGNOSIS — R52 Pain, unspecified: Secondary | ICD-10-CM | POA: Diagnosis not present

## 2023-11-09 DIAGNOSIS — E785 Hyperlipidemia, unspecified: Secondary | ICD-10-CM | POA: Diagnosis present

## 2023-11-09 DIAGNOSIS — Z961 Presence of intraocular lens: Secondary | ICD-10-CM | POA: Diagnosis present

## 2023-11-09 DIAGNOSIS — I6782 Cerebral ischemia: Secondary | ICD-10-CM | POA: Diagnosis not present

## 2023-11-09 DIAGNOSIS — Z803 Family history of malignant neoplasm of breast: Secondary | ICD-10-CM

## 2023-11-09 DIAGNOSIS — S32011A Stable burst fracture of first lumbar vertebra, initial encounter for closed fracture: Secondary | ICD-10-CM | POA: Diagnosis not present

## 2023-11-09 DIAGNOSIS — R5381 Other malaise: Secondary | ICD-10-CM | POA: Diagnosis not present

## 2023-11-09 DIAGNOSIS — S32019A Unspecified fracture of first lumbar vertebra, initial encounter for closed fracture: Secondary | ICD-10-CM | POA: Diagnosis not present

## 2023-11-09 DIAGNOSIS — N3289 Other specified disorders of bladder: Secondary | ICD-10-CM | POA: Diagnosis present

## 2023-11-09 DIAGNOSIS — G44309 Post-traumatic headache, unspecified, not intractable: Secondary | ICD-10-CM | POA: Diagnosis not present

## 2023-11-09 DIAGNOSIS — M25551 Pain in right hip: Secondary | ICD-10-CM | POA: Diagnosis not present

## 2023-11-09 DIAGNOSIS — Z833 Family history of diabetes mellitus: Secondary | ICD-10-CM

## 2023-11-09 DIAGNOSIS — G8911 Acute pain due to trauma: Secondary | ICD-10-CM | POA: Diagnosis not present

## 2023-11-09 DIAGNOSIS — Z823 Family history of stroke: Secondary | ICD-10-CM

## 2023-11-09 DIAGNOSIS — M47814 Spondylosis without myelopathy or radiculopathy, thoracic region: Secondary | ICD-10-CM | POA: Diagnosis not present

## 2023-11-09 DIAGNOSIS — Z853 Personal history of malignant neoplasm of breast: Secondary | ICD-10-CM

## 2023-11-09 DIAGNOSIS — N3010 Interstitial cystitis (chronic) without hematuria: Secondary | ICD-10-CM | POA: Diagnosis present

## 2023-11-09 DIAGNOSIS — Y92009 Unspecified place in unspecified non-institutional (private) residence as the place of occurrence of the external cause: Secondary | ICD-10-CM

## 2023-11-09 DIAGNOSIS — R29890 Loss of height: Secondary | ICD-10-CM | POA: Diagnosis not present

## 2023-11-09 DIAGNOSIS — Z860101 Personal history of adenomatous and serrated colon polyps: Secondary | ICD-10-CM

## 2023-11-09 DIAGNOSIS — R29898 Other symptoms and signs involving the musculoskeletal system: Secondary | ICD-10-CM | POA: Diagnosis not present

## 2023-11-09 DIAGNOSIS — Z888 Allergy status to other drugs, medicaments and biological substances status: Secondary | ICD-10-CM

## 2023-11-09 DIAGNOSIS — Z91048 Other nonmedicinal substance allergy status: Secondary | ICD-10-CM

## 2023-11-09 DIAGNOSIS — M8580 Other specified disorders of bone density and structure, unspecified site: Secondary | ICD-10-CM | POA: Diagnosis present

## 2023-11-09 DIAGNOSIS — Z9842 Cataract extraction status, left eye: Secondary | ICD-10-CM

## 2023-11-09 LAB — CK: Total CK: 304 U/L — ABNORMAL HIGH (ref 38–234)

## 2023-11-09 LAB — BASIC METABOLIC PANEL WITH GFR
Anion gap: 9 (ref 5–15)
BUN: 12 mg/dL (ref 8–23)
CO2: 26 mmol/L (ref 22–32)
Calcium: 9.6 mg/dL (ref 8.9–10.3)
Chloride: 102 mmol/L (ref 98–111)
Creatinine, Ser: 0.62 mg/dL (ref 0.44–1.00)
GFR, Estimated: >60 mL/min (ref >60)
Glucose, Bld: 142 mg/dL — ABNORMAL HIGH (ref 70–99)
Potassium: 3.9 mmol/L (ref 3.5–5.1)
Sodium: 137 mmol/L (ref 135–145)

## 2023-11-09 LAB — CBC
HCT: 27.1 % — ABNORMAL LOW (ref 36.0–46.0)
Hemoglobin: 8.9 g/dL — ABNORMAL LOW (ref 12.0–15.0)
MCH: 36.9 pg — ABNORMAL HIGH (ref 26.0–34.0)
MCHC: 32.8 g/dL (ref 30.0–36.0)
MCV: 112.4 fL — ABNORMAL HIGH (ref 80.0–100.0)
Platelets: 265 K/uL (ref 150–400)
RBC: 2.41 MIL/uL — ABNORMAL LOW (ref 3.87–5.11)
RDW: 14.9 % (ref 11.5–15.5)
WBC: 8.8 K/uL (ref 4.0–10.5)
nRBC: 0.2 % (ref 0.0–0.2)

## 2023-11-09 MED ORDER — SODIUM CHLORIDE 0.9 % IV BOLUS
500.0000 mL | Freq: Once | INTRAVENOUS | Status: AC
  Administered 2023-11-09: 500 mL via INTRAVENOUS

## 2023-11-09 MED ORDER — FENTANYL CITRATE (PF) 50 MCG/ML IJ SOSY
50.0000 ug | PREFILLED_SYRINGE | Freq: Once | INTRAMUSCULAR | Status: AC
  Administered 2023-11-09: 50 ug via INTRAVENOUS
  Filled 2023-11-09: qty 1

## 2023-11-09 NOTE — ED Provider Notes (Signed)
 [place] EMERGENCY DEPARTMENT AT [facility] Provider Note   CSN: [id number] Arrival date & time: 11/09/23  2213     Patient presents with: Olivia Evans is a 89 y.o. female.  Patient with past medical history significant for osteoporosis, interstitial cystitis, prediabetes presents to the emergency department via EMS for evaluation secondary to a fall.  Patient lives at home alone.  She states she was stepping down into her garage and missed a step, falling onto her buttocks and back.  She denies hitting her head but endorses neck pain and back pain hip pain and sacral/coccygeal pain.  During further assessment patient also endorses new onset urinary incontinence.  The patient does not take blood thinners.  She was reportedly on the ground for 6 hours prior to her son-in-law finding her.  She denies shortness of breath, chest pain, abdominal pain.  {Add pertinent medical, surgical, social history, OB history to YEP:67052}  Fall       Prior to Admission medications   Medication Sig Start Date End Date Taking? Authorizing Provider  acetaminophen  (TYLENOL ) 325 MG tablet Take 325 mg by mouth every 8 (eight) hours as needed for moderate pain.    [provider]  Apoaequorin (PREVAGEN PO) Take by mouth.    [provider]  B Complex Vitamins (B COMPLEX-B12 PO) Take by mouth.    [provider]  Cholecalciferol (VITAMIN D3) 1000 UNITS CAPS Take 4,000 Units by mouth daily.    [provider]  Ferrous Sulfate (IRON PO) Take 1 tablet by mouth 2 (two) times daily. Patient takes Mega-Food Blood Builder Iron Minis    [provider]  Mag Aspart-Potassium Aspart 90-90 MG CAPS Take by mouth.    [provider]  Multiple Vitamins-Minerals (PRESERVISION AREDS 2 PO) Take 1 capsule by mouth in the morning and at bedtime.    [provider]  OVER THE COUNTER MEDICATION Take 1 tablet by mouth at bedtime as needed  (laxitive). PruneLax    [provider]  Polyethyl Glycol-Propyl Glycol (SYSTANE OP) Place 1 drop into both eyes daily. To both eyes.    [provider]  torsemide  (DEMADEX ) 20 MG tablet TAKE 1 TABLET BY MOUTH EVERY DAY 05/27/23   Geofm Glade PARAS, MD    Allergies: Premarin [conjugated estrogens], Vicodin [hydrocodone -acetaminophen ], Adhesive [tape], Fluvastatin sodium, and Simvastatin    Review of Systems  Updated Vital Signs BP (!) 164/129 (BP Location: Right Arm)   Pulse 99   Temp 98.8 F (37.1 C) (Oral)   Resp 16   Ht 5' 4 (1.626 m)   Wt 59 kg   SpO2 98%   BMI 22.31 kg/m   Physical Exam Vitals and nursing note reviewed.  Constitutional:      General: She is not in acute distress.    Appearance: She is well-developed.  HENT:     Head: Normocephalic and atraumatic.  Eyes:     Conjunctiva/sclera: Conjunctivae normal.  Neck:     Comments: C-collar in place, tenderness to posterior neck, hard to localize with c-collar Cardiovascular:     Rate and Rhythm: Normal rate and regular rhythm.  Pulmonary:     Effort: Pulmonary effort is normal. No respiratory distress.     Breath sounds: Normal breath sounds.  Abdominal:     Palpations: Abdomen is soft.     Tenderness: There is no abdominal tenderness.  Musculoskeletal:        General: Tenderness present. No swelling.  Comments: Patient with tenderness on left flank to the back, worse in the coccygeal region.  No significant increased pain with passive range of motion of bilateral legs at hip region.  Patient endorsing bilateral leg weakness  Skin:    General: Skin is warm and dry.     Capillary Refill: Capillary refill takes less than 2 seconds.  Neurological:     Mental Status: She is alert.     Comments: New onset urinary incontinence  Psychiatric:        Mood and Affect: Mood normal.     (all labs ordered are listed, but only abnormal results are displayed) Labs Reviewed  CBC - Abnormal; Notable  for the following components:      Result Value   RBC 2.41 (*)    Hemoglobin 8.9 (*)    HCT 27.1 (*)    MCV 112.4 (*)    MCH 36.9 (*)    All other components within normal limits  BASIC METABOLIC PANEL WITH GFR  CK  URINALYSIS, ROUTINE W REFLEX MICROSCOPIC    EKG: None  Radiology: CT Head Wo Contrast Result Date: 11/09/2023 CLINICAL DATA:  Recent slip and fall with headaches, initial encounter EXAM: CT HEAD WITHOUT CONTRAST TECHNIQUE: Contiguous axial images were obtained from the base of the skull through the vertex without intravenous contrast. RADIATION DOSE REDUCTION: This exam was performed according to the departmental dose-optimization program which includes automated exposure control, adjustment of the mA and/or kV according to patient size and/or use of iterative reconstruction technique. COMPARISON:  None Available. FINDINGS: Brain: No evidence of acute infarction, hemorrhage, hydrocephalus, extra-axial collection or mass lesion/mass effect. Cavum septum pellucidum is noted. Mild chronic white matter ischemic changes are noted. Vascular: No hyperdense vessel or unexpected calcification. Skull: Normal. Negative for fracture or focal lesion. Sinuses/Orbits: No acute finding. Other: None. IMPRESSION: Chronic ischemic changes without acute abnormality. Electronically Signed   By: Oneil Devonshire M.D.   On: 11/09/2023 23:22    {Document cardiac monitor, telemetry assessment procedure when appropriate:32947} Procedures   Medications Ordered in the ED  sodium chloride  0.9 % bolus 500 mL (500 mLs Intravenous New Bag/Given 11/09/23 2259)  fentaNYL  (SUBLIMAZE ) injection 50 mcg (50 mcg Intravenous Given 11/09/23 2256)      {Click here for ABCD2, HEART and other calculators REFRESH Note before signing:1}                              Medical Decision Making Amount and/or Complexity of Data Reviewed Labs: ordered. Radiology: ordered.  Risk Prescription drug management.   This  patient presents to the ED for concern of injuries post fall, this involves an extensive number of treatment options, and is a complaint that carries with it a high risk of complications and morbidity.  The differential diagnosis includes fracture, dislocation, soft tissue injury, cauda equina, intracranial normality, others   Co morbidities / Chronic conditions that complicate the patient evaluation  As noted in HPI   Additional history obtained:  Additional history obtained from EMR   Lab Tests:  I Ordered, and personally interpreted labs.  The pertinent results include: Hemoglobin at baseline   Imaging Studies ordered:  I ordered imaging studies including CT scans of the head, cervical spine, thoracic spine, and lumbar spine, plain films of the hips, MRI of lumbar spine I independently visualized and interpreted imaging which showed *** I agree with the radiologist interpretation   Cardiac Monitoring: / EKG:  The patient was maintained on a cardiac monitor.  I personally viewed and interpreted the cardiac monitored which showed an underlying rhythm of: ***   Problem List / ED Course / Critical interventions / Medication management   I ordered medication including saline, fentanyl  Reevaluation of the patient after these medicines showed that the patient *** I have reviewed the patients home medicines and have made adjustments as needed   Consultations Obtained:  I requested consultation with the ***,  and discussed lab and imaging findings as well as pertinent plan - they recommend: ***   Social Determinants of Health:  ***   Test / Admission - Considered:  ***   {Document critical care time when appropriate  Document review of labs and clinical decision tools ie CHADS2VASC2, etc  Document your independent review of radiology images and any outside records  Document your discussion with family members, caretakers and with consultants  Document social  determinants of health affecting pt's care  Document your decision making why or why not admission, treatments were needed:32947:::1}   Final diagnoses:  None    ED Discharge Orders     None

## 2023-11-09 NOTE — ED Triage Notes (Signed)
 PER EMS: Pt is from home, found by her son laying on garage floor. She reports she tripped when going down 2 steps and fell backwards and had been laying on the floor unable to get up for about 6 hours before her son found her. Denies head injury, no LOC, no blood thinners. She reports pain in her neck and coccyx. She arrives in a c-collar. She has bilateral leg weakness. + sensation.  She is A&OX4.  BP- 118/82. HR-95, 94% RA, CBG-150

## 2023-11-09 NOTE — ED Notes (Signed)
 Pt reports incontinence of urine on multiple episodes that had started since fall

## 2023-11-10 ENCOUNTER — Emergency Department (HOSPITAL_COMMUNITY)

## 2023-11-10 ENCOUNTER — Encounter (HOSPITAL_COMMUNITY): Payer: Self-pay | Admitting: Family Medicine

## 2023-11-10 DIAGNOSIS — R29898 Other symptoms and signs involving the musculoskeletal system: Secondary | ICD-10-CM | POA: Diagnosis not present

## 2023-11-10 DIAGNOSIS — S32019A Unspecified fracture of first lumbar vertebra, initial encounter for closed fracture: Secondary | ICD-10-CM | POA: Diagnosis present

## 2023-11-10 DIAGNOSIS — I5032 Chronic diastolic (congestive) heart failure: Secondary | ICD-10-CM | POA: Diagnosis not present

## 2023-11-10 DIAGNOSIS — S199XXA Unspecified injury of neck, initial encounter: Secondary | ICD-10-CM | POA: Diagnosis not present

## 2023-11-10 DIAGNOSIS — D469 Myelodysplastic syndrome, unspecified: Secondary | ICD-10-CM | POA: Diagnosis not present

## 2023-11-10 DIAGNOSIS — M5136 Other intervertebral disc degeneration, lumbar region with discogenic back pain only: Secondary | ICD-10-CM | POA: Diagnosis not present

## 2023-11-10 DIAGNOSIS — S32010A Wedge compression fracture of first lumbar vertebra, initial encounter for closed fracture: Secondary | ICD-10-CM | POA: Diagnosis not present

## 2023-11-10 DIAGNOSIS — W19XXXA Unspecified fall, initial encounter: Secondary | ICD-10-CM | POA: Diagnosis not present

## 2023-11-10 DIAGNOSIS — M542 Cervicalgia: Secondary | ICD-10-CM | POA: Diagnosis not present

## 2023-11-10 DIAGNOSIS — S32011A Stable burst fracture of first lumbar vertebra, initial encounter for closed fracture: Secondary | ICD-10-CM

## 2023-11-10 LAB — CBC
HCT: 22.5 % — ABNORMAL LOW (ref 36.0–46.0)
Hemoglobin: 7.4 g/dL — ABNORMAL LOW (ref 12.0–15.0)
MCH: 36.6 pg — ABNORMAL HIGH (ref 26.0–34.0)
MCHC: 32.9 g/dL (ref 30.0–36.0)
MCV: 111.4 fL — ABNORMAL HIGH (ref 80.0–100.0)
Platelets: 192 K/uL (ref 150–400)
RBC: 2.02 MIL/uL — ABNORMAL LOW (ref 3.87–5.11)
RDW: 15 % (ref 11.5–15.5)
WBC: 4.8 K/uL (ref 4.0–10.5)
nRBC: 0 % (ref 0.0–0.2)

## 2023-11-10 LAB — BASIC METABOLIC PANEL WITH GFR
Anion gap: 5 (ref 5–15)
BUN: 13 mg/dL (ref 8–23)
CO2: 27 mmol/L (ref 22–32)
Calcium: 8.9 mg/dL (ref 8.9–10.3)
Chloride: 104 mmol/L (ref 98–111)
Creatinine, Ser: 0.64 mg/dL (ref 0.44–1.00)
GFR, Estimated: >60 mL/min (ref >60)
Glucose, Bld: 111 mg/dL — ABNORMAL HIGH (ref 70–99)
Potassium: 3.9 mmol/L (ref 3.5–5.1)
Sodium: 136 mmol/L (ref 135–145)

## 2023-11-10 LAB — HEMOGLOBIN AND HEMATOCRIT, BLOOD
HCT: 23.6 % — ABNORMAL LOW (ref 36.0–46.0)
Hemoglobin: 7.9 g/dL — ABNORMAL LOW (ref 12.0–15.0)

## 2023-11-10 LAB — URINALYSIS, ROUTINE W REFLEX MICROSCOPIC
Bacteria, UA: NONE SEEN
Bilirubin Urine: NEGATIVE
Glucose, UA: NEGATIVE mg/dL
Hgb urine dipstick: NEGATIVE
Ketones, ur: 20 mg/dL — AB
Leukocytes,Ua: NEGATIVE
Nitrite: NEGATIVE
Protein, ur: NEGATIVE mg/dL
Specific Gravity, Urine: 1.012 (ref 1.005–1.030)
pH: 8 (ref 5.0–8.0)

## 2023-11-10 MED ORDER — ACETAMINOPHEN 500 MG PO TABS
1000.0000 mg | ORAL_TABLET | Freq: Three times a day (TID) | ORAL | Status: AC
  Administered 2023-11-11 – 2023-11-12 (×4): 1000 mg via ORAL
  Filled 2023-11-10 (×4): qty 2

## 2023-11-10 MED ORDER — SENNOSIDES-DOCUSATE SODIUM 8.6-50 MG PO TABS
1.0000 | ORAL_TABLET | Freq: Every evening | ORAL | Status: DC | PRN
  Administered 2023-11-11 – 2023-11-17 (×5): 1 via ORAL
  Filled 2023-11-10 (×5): qty 1

## 2023-11-10 MED ORDER — ACETAMINOPHEN 500 MG PO TABS
500.0000 mg | ORAL_TABLET | Freq: Four times a day (QID) | ORAL | Status: DC
Start: 2023-11-10 — End: 2023-11-10
  Administered 2023-11-10: 500 mg via ORAL
  Filled 2023-11-10: qty 1

## 2023-11-10 MED ORDER — LIDOCAINE 5 % EX PTCH
2.0000 | MEDICATED_PATCH | CUTANEOUS | Status: DC
  Administered 2023-11-10 – 2023-11-20 (×11): 2 via TRANSDERMAL
  Filled 2023-11-10 (×11): qty 2

## 2023-11-10 MED ORDER — CALCITONIN (SALMON) 200 UNIT/ACT NA SOLN
1.0000 | Freq: Every day | NASAL | Status: DC
  Administered 2023-11-11 – 2023-11-20 (×10): 1 via NASAL
  Filled 2023-11-10: qty 3.7

## 2023-11-10 MED ORDER — TORSEMIDE 20 MG PO TABS
20.0000 mg | ORAL_TABLET | ORAL | Status: DC
  Administered 2023-11-11 – 2023-11-19 (×5): 20 mg via ORAL
  Filled 2023-11-10 (×5): qty 1

## 2023-11-10 MED ORDER — BISACODYL 5 MG PO TBEC
5.0000 mg | DELAYED_RELEASE_TABLET | Freq: Every day | ORAL | Status: DC | PRN
  Administered 2023-11-13: 5 mg via ORAL
  Filled 2023-11-10: qty 1

## 2023-11-10 MED ORDER — OXYCODONE HCL 5 MG PO TABS: 2.5000 mg | ORAL_TABLET | ORAL | Status: DC | PRN

## 2023-11-10 MED ORDER — FENTANYL CITRATE (PF) 50 MCG/ML IJ SOSY
75.0000 ug | PREFILLED_SYRINGE | Freq: Once | INTRAMUSCULAR | Status: AC
  Administered 2023-11-10: 75 ug via INTRAVENOUS
  Filled 2023-11-10: qty 2

## 2023-11-10 MED ORDER — FERROUS SULFATE 325 (65 FE) MG PO TABS
325.0000 mg | ORAL_TABLET | ORAL | Status: DC
  Administered 2023-11-10 – 2023-11-18 (×5): 325 mg via ORAL
  Filled 2023-11-10 (×6): qty 1

## 2023-11-10 MED ORDER — FENTANYL CITRATE (PF) 50 MCG/ML IJ SOSY
25.0000 ug | PREFILLED_SYRINGE | INTRAMUSCULAR | Status: DC | PRN
  Administered 2023-11-10 – 2023-11-17 (×16): 50 ug via INTRAVENOUS
  Filled 2023-11-10 (×17): qty 1

## 2023-11-10 MED ORDER — OXYCODONE HCL 5 MG PO TABS
5.0000 mg | ORAL_TABLET | ORAL | Status: DC | PRN
  Administered 2023-11-11 – 2023-11-19 (×9): 5 mg via ORAL
  Filled 2023-11-10 (×10): qty 1

## 2023-11-10 MED ORDER — METHOCARBAMOL 500 MG PO TABS
500.0000 mg | ORAL_TABLET | Freq: Four times a day (QID) | ORAL | Status: DC | PRN
  Administered 2023-11-10 – 2023-11-19 (×10): 500 mg via ORAL
  Filled 2023-11-10 (×11): qty 1

## 2023-11-10 MED ORDER — PROCHLORPERAZINE EDISYLATE 10 MG/2ML IJ SOLN: 5.0000 mg | Freq: Four times a day (QID) | INTRAMUSCULAR | Status: DC | PRN

## 2023-11-10 MED ORDER — ACETAMINOPHEN 500 MG PO TABS
1000.0000 mg | ORAL_TABLET | Freq: Three times a day (TID) | ORAL | Status: DC
  Administered 2023-11-10 (×2): 1000 mg via ORAL
  Filled 2023-11-10 (×2): qty 2

## 2023-11-10 NOTE — Evaluation (Signed)
 Occupational Therapy Evaluation Patient Details Name: Olivia Evans MRN: [id number] DOB: 12/14/1926 Today's Date: 11/10/2023   History of Present Illness   Olivia Evans is a 89 y.o. female admitted with c/o back pain s/p fall at home. MRI: Acute incomplete burst fracture of L1 with approximately 20% height loss and  5 mm retropulsion, narrowing of the right lateral recess at T12-L1 due to retropulsion from the L1 fracture, and small left asymmetric disc bulge at L2-3 and small disc bulge at L4-L5. PHMx: myelodysplastic syndrome, chronic HFpEF, RBBB, interstitial cystitis, arthritis, diverticulosis, heart murmur, R breast CA s/p mastectomy with radiation 2001, osteopenia, rotator cuff repair.     Clinical Impressions This 89 yo female normally very active doing her own ADLs, IADLs, driving minimally and not using an AD for ambulation unless going to mailbox (uses [facility]). She currently is setup-Max A for basic ADLs, min A for bed mobility and stand pivot tranfers. She will continue to benefit from acute OT with follow up from continued inpatient follow up therapy, <3 hours/day.      If plan is discharge home, recommend the following:   A little help with walking and/or transfers;A lot of help with bathing/dressing/bathroom;Assistance with cooking/housework;Help with stairs or ramp for entrance;Assist for transportation     Functional Status Assessment   Patient has had a recent decline in their functional status and demonstrates the ability to make significant improvements in function in a reasonable and predictable amount of time.     Equipment Recommendations   None recommended by OT      Precautions/Restrictions   Precautions Precautions: Fall;Back Precaution Booklet Issued: No Recall of Precautions/Restrictions: Impaired Required Braces or Orthoses: Spinal Brace Spinal Brace: Thoracolumbosacral orthotic;Applied in sitting position Restrictions Weight Bearing  Restrictions Per Provider Order: No     Mobility Bed Mobility Overal bed mobility: Needs Assistance             General bed mobility comments: Min A--tried to explain to her how to roll to get out of bed, but she had her own way. Pt sleeps in a recliner at home    Transfers Overall transfer level: Needs assistance Equipment used: 1 person hand held assist Transfers: Sit to/from Stand, Bed to chair/wheelchair/BSC Sit to Stand: Min assist     Step pivot transfers: Min assist     General transfer comment: therapist standing in front of her      Balance Overall balance assessment: Needs assistance Sitting-balance support: No upper extremity supported, Feet supported Sitting balance-Leahy Scale: Fair     Standing balance support: Bilateral upper extremity supported Standing balance-Leahy Scale: Poor Standing balance comment: reliant on therapist for balance                           ADL either performed or assessed with clinical judgement   ADL Overall ADL's : Needs assistance/impaired Eating/Feeding: Independent;Sitting   Grooming: Set up;Sitting   Upper Body Bathing: Set up;Sitting   Lower Body Bathing: Maximal assistance Lower Body Bathing Details (indicate cue type and reason): min A sit<>stand Upper Body Dressing : Minimal assistance;Sitting   Lower Body Dressing: Maximal assistance Lower Body Dressing Details (indicate cue type and reason): min A sit<>stand Toilet Transfer: Minimal assistance;Stand-pivot Toilet Transfer Details (indicate cue type and reason): Bil HHA (therapist standing in front of her) simulated bed>recliner Toileting- Clothing Manipulation and Hygiene: Total assistance Toileting - Clothing Manipulation Details (indicate cue type and reason): min A sit<>stand  Vision Patient Visual Report: No change from baseline              Pertinent Vitals/Pain Pain Assessment Pain Assessment: 0-10 Pain Score: 8   Pain Location: back Pain Descriptors / Indicators: Aching, Discomfort, Grimacing, Sore Pain Intervention(s): Limited activity within patient's tolerance, Monitored during session, Premedicated before session, Repositioned     Extremity/Trunk Assessment Upper Extremity Assessment Upper Extremity Assessment: Overall WFL for tasks assessed           Communication Communication Communication: Impaired Factors Affecting Communication: Hearing impaired   Cognition Arousal: Alert Behavior During Therapy: WFL for tasks assessed/performed Cognition: No apparent impairments                               Following commands: Intact       Cueing    Cueing Techniques: Verbal cues              Home Living Family/patient expects to be discharged to:: Private residence Living Arrangements: Alone Available Help at Discharge: Family;Available 24 hours/day Type of Home: House Home Access: Ramped entrance (stairs at other entrance no rail)     Home Layout: One level     Bathroom Shower/Tub: Walk-in shower;Curtain   Bathroom Toilet: Handicapped height     Home Equipment: Cane - quad;Cane - single point   Additional Comments: only drives to [employer] school and grocery store      Prior Functioning/Environment Prior Level of Function : Independent/Modified Independent;Driving             Mobility Comments: usually only uses SPC when going to mailbox ADLs Comments: usually takes sponge baths    OT Problem List: Decreased strength;Decreased range of motion;Decreased activity tolerance;Impaired balance (sitting and/or standing);Pain   OT Treatment/Interventions: Self-care/ADL training;DME and/or AE instruction;Balance training;Patient/family education      OT Goals(Current goals can be found in the care plan section)   Acute Rehab OT Goals Patient Stated Goal: to possibly go to rehab before home OT Goal Formulation: With patient/family Time For Goal  Achievement: 11/24/23 Potential to Achieve Goals: Good   OT Frequency:  Min 2X/week    Co-evaluation              AM-PAC OT 6 Clicks Daily Activity     Outcome Measure Help from another person eating meals?: None Help from another person taking care of personal grooming?: A Little Help from another person toileting, which includes using toliet, bedpan, or urinal?: A Lot Help from another person bathing (including washing, rinsing, drying)?: A Lot Help from another person to put on and taking off regular upper body clothing?: A Little Help from another person to put on and taking off regular lower body clothing?: A Lot 6 Click Score: 16   End of Session Equipment Utilized During Treatment: Gait belt;Back brace Nurse Communication: Mobility status (via secure chat and in person for brace. NT: in person for mobility and brace)  Activity Tolerance: Patient tolerated treatment well Patient left: in chair;with call bell/phone within reach  OT Visit Diagnosis: Unsteadiness on feet (R26.81);Other abnormalities of gait and mobility (R26.89);History of falling (Z91.81);Pain Pain - part of body:  (back)                Time: 9171-9069 OT Time Calculation (min): 62 min Charges:  OT General Charges $OT Visit: 1 Visit OT Evaluation $OT Eval Moderate Complexity: 1 Mod OT Treatments $Self Care/Home Management : 38-52  mins  Donny BECKER OT Acute Rehabilitation Services Office [phone]    [place] Dorothyann Distel 11/10/2023, 10:20 AM

## 2023-11-10 NOTE — Evaluation (Signed)
 Physical Therapy Evaluation Patient Details Name: Olivia Evans MRN: [id number] DOB: 1926-02-14 Today's Date: 11/10/2023  History of Present Illness  Olivia Evans is a 89 y.o. female admitted with c/o back pain s/p fall at home. MRI: Acute incomplete burst fracture of L1 with approximately 20% height loss and  5 mm retropulsion, narrowing of the right lateral recess at T12-L1 due to retropulsion from the L1 fracture, and small left asymmetric disc bulge at L2-3 and small disc bulge at L4-L5. PHMx: myelodysplastic syndrome, chronic HFpEF, RBBB, interstitial cystitis, arthritis, diverticulosis, heart murmur, R breast CA s/p mastectomy with radiation 2001, osteopenia, rotator cuff repair.  Clinical Impression  Pt is currently presenting at Min A for sit to stand and Min A to CGA for short non-functional distance gait with RW. Family was present and supportive. Currently pt is at a high risk for falls. Due to pt current functional status, home set up and available assistance at home recommending skilled physical therapy services < 3 hours/day in order to address strength, balance and functional mobility to decrease risk for falls, injury, immobility, skin break down and re-hospitalization.          If plan is discharge home, recommend the following: A little help with walking and/or transfers;Assistance with cooking/housework;Help with stairs or ramp for entrance;Assist for transportation   Can travel by private vehicle   Yes    Equipment Recommendations None recommended by PT     Functional Status Assessment Patient has had a recent decline in their functional status and demonstrates the ability to make significant improvements in function in a reasonable and predictable amount of time.     Precautions / Restrictions Precautions Precautions: Fall;Back Precaution Booklet Issued: No Recall of Precautions/Restrictions: Impaired Required Braces or Orthoses: Spinal Brace Spinal Brace:  Thoracolumbosacral orthotic;Applied in sitting position Restrictions Weight Bearing Restrictions Per Provider Order: No      Mobility  Bed Mobility     General bed mobility comments: pt up in recliner on arrival and departure. Sleeps in recliner at home.    Transfers Overall transfer level: Needs assistance Equipment used: Rolling walker (2 wheels) Transfers: Sit to/from Stand Sit to Stand: Min assist           General transfer comment: Min A for sit to stand with good hand placement from pt. Min A for initial momentum to get to standing.    Ambulation/Gait Ambulation/Gait assistance: Contact guard assist, Min assist Gait Distance (Feet): 40 Feet Assistive device: Rolling walker (2 wheels) Gait Pattern/deviations: Step-through pattern, Decreased step length - left, Decreased step length - right Gait velocity: decreased Gait velocity interpretation: <1.31 ft/sec, indicative of household ambulator   General Gait Details: very kyphotic posture, Min A to CGA for balance, stabilization.  Stairs Stairs:  (pt has ramped entrance)             Balance Overall balance assessment: Needs assistance Sitting-balance support: No upper extremity supported, Feet supported Sitting balance-Leahy Scale: Fair     Standing balance support: Bilateral upper extremity supported Standing balance-Leahy Scale: Poor Standing balance comment: reliant on therapist for balance       Pertinent Vitals/Pain Pain Assessment Pain Assessment: 0-10 Pain Score: 8  Pain Location: back Pain Descriptors / Indicators: Aching, Discomfort, Grimacing, Sore Pain Intervention(s): Limited activity within patient's tolerance, Monitored during session, Patient requesting pain meds-RN notified    Home Living Family/patient expects to be discharged to:: Private residence Living Arrangements: Alone Available Help at Discharge: Family;Available 24 hours/day Type  of Home: House Home Access: Ramped  entrance (stairs at other entrance no rail)       Home Layout: One level Home Equipment: [place] - quad;Cane - single point Additional Comments: only drives to [employer] school and grocery store    Prior Function Prior Level of Function : Independent/Modified Independent;Driving             Mobility Comments: usually only uses SPC when going to mailbox ADLs Comments: usually takes sponge baths     Extremity/Trunk Assessment   Upper Extremity Assessment Upper Extremity Assessment: Defer to OT evaluation    Lower Extremity Assessment Lower Extremity Assessment: Generalized weakness;Overall [facility] for tasks assessed    Cervical / Trunk Assessment Cervical / Trunk Assessment: Kyphotic;Other exceptions Cervical / Trunk Exceptions: L1 fracture  Communication   Communication Communication: Impaired Factors Affecting Communication: Hearing impaired    Cognition Arousal: Alert Behavior During Therapy: WFL for tasks assessed/performed   PT - Cognitive impairments: No apparent impairments     Following commands: Intact       Cueing Cueing Techniques: Verbal cues     General Comments General comments (skin integrity, edema, etc.): Pt was assisted with adjusting the brace; continues to push up into her throat due to significant kyphotic posture.        Assessment/Plan    PT Assessment Patient needs continued PT services  PT Problem List Decreased strength;Decreased balance;Decreased activity tolerance;Decreased safety awareness;Decreased mobility;Pain       PT Treatment Interventions DME instruction;Functional mobility training;Balance training;Gait training;Therapeutic exercise;Therapeutic activities;Patient/family education    PT Goals (Current goals can be found in the Care Plan section)  Acute Rehab PT Goals Patient Stated Goal: to get stronger, reduce pain PT Goal Formulation: With patient/family Time For Goal Achievement: 11/24/23 Potential to Achieve Goals:  Good    Frequency Min 2X/week        AM-PAC PT 6 Clicks Mobility  Outcome Measure Help needed turning from your back to your side while in a flat bed without using bedrails?: A Lot Help needed moving from lying on your back to sitting on the side of a flat bed without using bedrails?: A Lot Help needed moving to and from a bed to a chair (including a wheelchair)?: A Little Help needed standing up from a chair using your arms (e.g., wheelchair or bedside chair)?: A Little Help needed to walk in hospital room?: A Little Help needed climbing 3-5 steps with a railing? : A Lot 6 Click Score: 15    End of Session Equipment Utilized During Treatment: Gait belt Activity Tolerance: Patient tolerated treatment well;Patient limited by pain Patient left: in chair;with call bell/phone within reach;with family/visitor present Nurse Communication: Mobility status PT Visit Diagnosis: Unsteadiness on feet (R26.81);Other abnormalities of gait and mobility (R26.89);Muscle weakness (generalized) (M62.81);Pain Pain - Right/Left: Left (bil) Pain - part of body: Shoulder (back)    Time: 8997-8968 PT Time Calculation (min) (ACUTE ONLY): 29 min   Charges:   PT Evaluation $PT Eval Low Complexity: 1 Low PT Treatments $Therapeutic Activity: 8-22 mins PT General Charges $$ ACUTE PT VISIT: 1 Visit         Dorothyann Maier, DPT, CLT  Acute Rehabilitation Services Office: [phone] (Secure chat preferred)   Dorothyann VEAR Maier 11/10/2023, 10:40 AM

## 2023-11-10 NOTE — ED Notes (Signed)
 Patient transported to MRI

## 2023-11-10 NOTE — Progress Notes (Addendum)
 PROGRESS NOTE    Olivia Evans  FMW:[id number] DOB: [birth year] DOA: 11/09/2023 PCP: Geofm Glade PARAS, MD  Chief Complaint  Patient presents with   Fall    Brief Narrative:   Olivia Evans is Olivia Evans 89 y.o. female with medical history significant for myelodysplastic syndrome, chronic HFpEF, and interstitial cystitis who presents with low back pain after Olivia Evans fall at home.   Seen by NSGY, recommending TLSO when OOB x3 months.  Outpatient NSGY f/u in 2 weeks.    Therapy recommending SNF.   Assessment & Plan:   Principal Problem:   Closed L1 vertebral fracture (HCC) Active Problems:   Myelodysplasia (myelodysplastic syndrome) (HCC)   Chronic heart failure with preserved ejection fraction (HFpEF) (HCC)  1. Acute Incomplete Burst Fracture of L1 - MRI showing incomplete L1 burst fracture with 20% height loss and 5 mm retropulsion c/w L1 A3 injury - narrowing of R lateral recess at T12-L1 due to retropulsion from L1 fracture.  Small L asymmetric disc bulge at L2-3 and small disc bulge at L4-L5 - appreciate nsgy recs, recommending TLSO brace when OOB x3 months (don brace sitting and wear when out of bed) - follow up in 2 weeks (nsgy note 10/21, Dr. Joshua) - scheduled APAP, oxycodone  prn, fentanyl  for breakthrough, robaxin prn, calcitonin nasal spray, lidocaine  patch - consult PT and OT     2. MDS  - Under observation, follows with Dr. Sherrod  - Stable  - trend H/H while admitted   3. Chronic HFpEF  - EF was preserved on echo from October 2023  - Appears compensated, continue torsemide  every other day      DVT prophylaxis: SCD Code Status: DNR Family Communication: none Disposition:   Status is: Observation The patient remains OBS appropriate and will d/c before 2 midnights.   Consultants:  neurosurgery  Procedures:  none  Antimicrobials:  Anti-infectives (From admission, onward)    None       Subjective: C/o 8-9/10 pain  Notes urinary incontinence, but notes this  is not new (related to diuretic?) Denies numbness  Objective: Vitals:   11/10/23 0500 11/10/23 0545 11/10/23 0630 11/10/23 0700  BP: (!) 133/53 (!) 131/49 (!) 129/53 (!) 137/53  Pulse: 74 69 73 72  Resp: 20 19 19 19   Temp:      TempSrc:      SpO2: 93% 94% 94% 92%  Weight:      Height:       No intake or output data in the 24 hours ending 11/10/23 0911 Filed Weights   11/09/23 2221  Weight: 59 kg    Examination:  General exam: Appears calm and comfortable  Respiratory system: unlabored Cardiovascular system: RRR Gastrointestinal system: Abdomen is nondistended, soft and nontender. Central nervous system: Alert and oriented. Symmetric strength of bilateral LE (pain with RLE movement in back) Extremities: bilateral LE edema   Data Reviewed: I have personally reviewed following labs and imaging studies  CBC: Recent Labs  Lab 11/09/23 2300 11/10/23 0555 11/10/23 0800  WBC 8.8 4.8  --   HGB 8.9* 7.4* 7.9*  HCT 27.1* 22.5* 23.6*  MCV 112.4* 111.4*  --   PLT 265 192  --     Basic Metabolic Panel: Recent Labs  Lab 11/09/23 2300 11/10/23 0555  NA 137 136  K 3.9 3.9  CL 102 104  CO2 26 27  GLUCOSE 142* 111*  BUN 12 13  CREATININE 0.62 0.64  CALCIUM 9.6 8.9    GFR: Estimated Creatinine  Clearance: 36.3 mL/min (by C-G formula based on SCr of 0.64 mg/dL).  Liver Function Tests: No results for input(s): AST, ALT, ALKPHOS, BILITOT, PROT, ALBUMIN in the last 168 hours.  CBG: No results for input(s): GLUCAP in the last 168 hours.   No results found for this or any previous visit (from the past 240 hours).       Radiology Studies: MR LUMBAR SPINE WO CONTRAST Result Date: 11/10/2023 EXAM: MRI LUMBAR SPINE 11/10/2023 12:48:00 AM TECHNIQUE: Multiplanar multisequence MRI of the lumbar spine was performed without the administration of intravenous contrast. COMPARISON: Lumbar spine CT 11/09/2023. CLINICAL HISTORY: Low back pain, cauda equina  syndrome suspected. FINDINGS: BONES AND ALIGNMENT: There is an acute incomplete burst fracture of L1 with 20% height loss and 5 mm of retropulsion. According to the AO Spine classification of thoracolumbar injuries, the finding is consistent with an L1 A3 (incomplete burst fracture) injury. The recommendation is to use the AO Spine thoracolumbar injury score (TL AOSIS) to guide management, with Olivia Evans score of 3 points for A3 morphology. Further management depends on neurological status and modifiers, but an A3 injury alone typically falls in the 0-3 point range, suggesting conservative treatment if no additional risk factors are present. There is bone marrow edema underlying the superior L1 endplate. SPINAL CORD: The conus terminates normally. SOFT TISSUES: No paraspinal mass. T12-L1: There is narrowing of the right lateral recess due to retropulsion from the L1 fracture. L1-L2: No significant disc herniation. No spinal canal stenosis or neural foraminal narrowing. L2-L3: Small left asymmetric disc bulge. No central spinal canal or neural foraminal stenosis. L3-L4: No significant disc herniation. No spinal canal stenosis or neural foraminal narrowing. L4-L5: Small disc bulge. No central spinal canal or neural foraminal stenosis. L5-S1: No significant disc herniation. No spinal canal stenosis or neural foraminal narrowing. IMPRESSION: 1. Acute incomplete burst fracture of L1 with approximately 20% height loss and 5 mm retropulsion, consistent with an L1 A3 injury. 2. Narrowing of the right lateral recess at T12-L1 due to retropulsion from the L1 fracture. 3. Small left asymmetric disc bulge at L2-3 and small disc bulge at L4-L5. Electronically signed by: Franky Stanford MD 11/10/2023 01:29 AM EDT RP Workstation: [id number]   CT Cervical Spine Wo Contrast Result Date: 11/10/2023 EXAM: CT CERVICAL SPINE WITHOUT CONTRAST 11/10/2023 12:14:42 AM TECHNIQUE: CT of the cervical spine was performed without the administration of  intravenous contrast. Multiplanar reformatted images are provided for review. Automated exposure control, iterative reconstruction, and/or weight based adjustment of the mA/kV was utilized to reduce the radiation dose to as low as reasonably achievable. COMPARISON: None available. CLINICAL HISTORY: Neck trauma (Age >= 65y). She reports she tripped when going down 2 steps and fell backwards and had been laying on the floor unable to get up for about 6 hours before her son found her. Denies head injury, no LOC, no ; blood thinners. She reports pain in her neck and coccyx. She arrives in [place] c-collar. She has bilateral leg weakness. FINDINGS: CERVICAL SPINE: BONES AND ALIGNMENT: No acute fracture or traumatic malalignment. DEGENERATIVE CHANGES: No significant degenerative changes. SOFT TISSUES: No prevertebral soft tissue swelling. LUNGS: Biapical pulmonary scarring. IMPRESSION: 1. No acute abnormality of the cervical spine. Electronically signed by: Franky Stanford MD 11/10/2023 12:23 AM EDT RP Workstation: [id number]   DG HIP UNILAT WITH PELVIS 2-3 VIEWS LEFT Result Date: 11/09/2023 CLINICAL DATA:  Recent fall with left hip pain, initial encounter EXAM: DG HIP (WITH OR WITHOUT PELVIS) 3V LEFT COMPARISON:  None Available. FINDINGS: Pelvic ring is intact. No acute fracture or dislocation is noted. No soft tissue changes are seen. IMPRESSION: No acute abnormality noted. Electronically Signed   By: Oneil Devonshire M.D.   On: 11/09/2023 23:48   DG HIP UNILAT WITH PELVIS 2-3 VIEWS RIGHT Result Date: 11/09/2023 CLINICAL DATA:  Recent fall with hip pain, initial encounter EXAM: DG HIP (WITH OR WITHOUT PELVIS) 3V RIGHT COMPARISON:  None Available. FINDINGS: Pelvic ring is intact. No acute fracture or dislocation is noted. No soft tissue abnormality is seen. IMPRESSION: No acute abnormality noted. Electronically Signed   By: Oneil Devonshire M.D.   On: 11/09/2023 23:48   CT Lumbar Spine Wo Contrast Result Date:  11/09/2023 EXAM: CT OF THE LUMBAR SPINE WITHOUT CONTRAST 11/09/2023 11:19:53 PM TECHNIQUE: CT of the lumbar spine was performed without the administration of intravenous contrast. Multiplanar reformatted images are provided for review. Automated exposure control, iterative reconstruction, and/or weight based adjustment of the mA/kV was utilized to reduce the radiation dose to as low as reasonably achievable. COMPARISON: None available. CLINICAL HISTORY: Back trauma, no prior imaging (Age >= 16y). Pt is from home, found by her son laying on garage floor. She reports she tripped when going down 2 steps and fell backwards and had been laying on the floor unable to get up for about 6 hours before her son found her. Denies head injury, no LOC, no blood thinners. She reports pain in her neck and coccyx. She arrives in [place] c-collar. She has bilateral leg weakness. + sensation. FINDINGS: BONES AND ALIGNMENT: Acute transverse fracture of the superior endplate of L1. Minimal vertebral body height loss. 5 mm of retropulsion of the superior endplate. The retropulsion is eccentric to the right and causes effacement of the right lateral recess. Mild spinal canal narrowing secondary to the retropulsion. The spinal canal is otherwise widely patent. Normal vertebral body heights are maintained at other visualized lumbar levels. DEGENERATIVE CHANGES: No significant degenerative changes. SOFT TISSUES: No acute abnormality. IMPRESSION: 1. Acute transverse fracture of the L1 superior endplate with minimal height loss and 5 mm right-eccentric retropulsion, causing effacement of the right lateral recess and mild spinal canal narrowing. Electronically signed by: Norman Gatlin MD 11/09/2023 11:29 PM EDT RP Workstation: [id number]   CT Thoracic Spine Wo Contrast Result Date: 11/09/2023 EXAM: CT THORACIC SPINE WITHOUT CONTRAST 11/09/2023 11:19:53 PM TECHNIQUE: CT of the thoracic spine was performed without the administration of  intravenous contrast. Multiplanar reformatted images are provided for review. Automated exposure control, iterative reconstruction, and/or weight based adjustment of the mA/kV was utilized to reduce the radiation dose to as low as reasonably achievable. COMPARISON: None available. CLINICAL HISTORY: Back trauma, no prior imaging (Age >= 16y). Pt is from home, found by her son laying on garage floor. She reports she tripped when going down 2 steps and fell backwards and had been laying on the floor unable to get up for about 6 hours before her son found her. Denies head injury, no LOC, no blood thinners. She reports pain in her neck and coccyx. She arrives in [place] c-collar. She has bilateral leg weakness. + sensation. FINDINGS: BONES AND ALIGNMENT: Normal vertebral body heights. No acute fracture in the thoracic spine. No suspicious bone lesion. Normal alignment. Lumbar spine reported separately. DEGENERATIVE CHANGES: Multilevel age related spondylosis. No severe spinal canal narrowing. SOFT TISSUES: No acute abnormality. LUNGS: Biapical pleural parenchymal scarring in the lungs. IMPRESSION: 1. No acute fracture of the thoracic spine. 2. Multilevel age-related spondylosis  without severe spinal canal narrowing. Electronically signed by: Norman Gatlin MD 11/09/2023 11:26 PM EDT RP Workstation: [id number]   CT Head Wo Contrast Result Date: 11/09/2023 CLINICAL DATA:  Recent slip and fall with headaches, initial encounter EXAM: CT HEAD WITHOUT CONTRAST TECHNIQUE: Contiguous axial images were obtained from the base of the skull through the vertex without intravenous contrast. RADIATION DOSE REDUCTION: This exam was performed according to the departmental dose-optimization program which includes automated exposure control, adjustment of the mA and/or kV according to patient size and/or use of iterative reconstruction technique. COMPARISON:  None Available. FINDINGS: Brain: No evidence of acute infarction, hemorrhage,  hydrocephalus, extra-axial collection or mass lesion/mass effect. Cavum septum pellucidum is noted. Mild chronic white matter ischemic changes are noted. Vascular: No hyperdense vessel or unexpected calcification. Skull: Normal. Negative for fracture or focal lesion. Sinuses/Orbits: No acute finding. Other: None. IMPRESSION: Chronic ischemic changes without acute abnormality. Electronically Signed   By: Oneil Devonshire M.D.   On: 11/09/2023 23:22        Scheduled Meds:  acetaminophen   1,000 mg Oral Q8H   calcitonin (salmon)  1 spray Alternating Nares Daily   lidocaine   2 patch Transdermal Q24H   [START ON 11/11/2023] torsemide   20 mg Oral QODAY   Continuous Infusions:   LOS: 0 days    Time spent: over 30 min     Meliton Monte, MD [facility]   To contact the attending provider between 7A-7P or the covering provider during after hours 7P-7A, please log into the web site [email or web address] and access using universal  password for that web site. If you do not have the password, please call the hospital operator.  11/10/2023, 9:11 AM

## 2023-11-10 NOTE — ED Notes (Signed)
Pt repositioned on right side

## 2023-11-10 NOTE — TOC Initial Note (Signed)
 Transition of Care [facility]) - Initial/Assessment Note    Patient Details  Name: Olivia Evans MRN: [id number] Date of Birth: August 27, 1926  [facility] CM/SW Contact:    Nena LITTIE Coffee, RN Phone Number: 11/10/2023, 4:28 PM  Clinical Narrative:                 Pt from home, lives alone c/supportive family nearby. Admitted c/injuries from a fall. Assessed for possible SNF placement.   Expected Discharge Plan: Skilled Nursing Facility Barriers to Discharge: Continued Medical Work up   Patient Goals and CMS Choice Patient states their goals for this hospitalization and ongoing recovery are:: Return home ASAP CMS Medicare.gov Compare Post Acute Care list provided to:: Patient Choice offered to / list presented to : Patient [place] ownership interest in [facility].provided to:: Patient    Expected Discharge Plan and Services In-house Referral: Clinical Social Work Discharge Planning Services: CM Consult Post Acute Care Choice: Skilled Nursing Facility Living arrangements for the past 2 months: Single Family Home                                      Prior Living Arrangements/Services Living arrangements for the past 2 months: Single Family Home Lives with:: Self Patient language and need for interpreter reviewed:: Yes Do you feel safe going back to the place where you live?: Yes      Need for Family Participation in Patient Care: Yes (Comment) Care giver support system in place?: Yes (comment) Current home services: DME (cane, walker) Criminal Activity/Legal Involvement Pertinent to Current Situation/Hospitalization: No - Comment as needed  Activities of Daily Living   ADL Screening (condition at time of admission) Independently performs ADLs?: No Does the patient have a NEW difficulty with bathing/dressing/toileting/self-feeding that is expected to last >3 days?: Yes (Initiates electronic notice to provider for possible OT consult) Does the  patient have a NEW difficulty with getting in/out of bed, walking, or climbing stairs that is expected to last >3 days?: Yes (Initiates electronic notice to provider for possible PT consult) Does the patient have a NEW difficulty with communication that is expected to last >3 days?: No Is the patient deaf or have difficulty hearing?: Yes Does the patient have difficulty seeing, even when wearing glasses/contacts?: No Does the patient have difficulty concentrating, remembering, or making decisions?: No  Permission Sought/Granted Permission sought to share information with : Case Manager, Magazine features editor, Family Supports Permission granted to share information with : Yes, Verbal Permission Granted              Emotional Assessment Appearance:: Appears younger than stated age Attitude/Demeanor/Rapport: Engaged Affect (typically observed): Appropriate Orientation: : Oriented to Self, Oriented to Situation, Oriented to  Time, Oriented to Place Alcohol / Substance Use: Not Applicable Psych Involvement: No (comment)  Admission diagnosis:  Urinary retention [R33.9] Closed L1 vertebral fracture (HCC) [S32.019A] Fall, initial encounter [W19.XXXA] Closed stable burst fracture of first lumbar vertebra, initial encounter (HCC) [S32.011A] Urinary incontinence, unspecified type [R32] Patient Active Problem List   Diagnosis Date Noted   Closed L1 vertebral fracture (HCC) 11/10/2023   Chronic heart failure with preserved ejection fraction (HFpEF) (HCC) 11/10/2023   Pain and swelling of lower extremity, right 01/16/2022   Murmur 11/06/2021   Myelodysplasia (myelodysplastic syndrome) (HCC) 11/05/2021   Neck pain 10/07/2021   Occipital neuralgia of left side 10/04/2021   Decreased hearing  of both ears 03/08/2021   Bicytopenia 10/23/2020   History of breast cancer on right 10/16/2020   Anemia 11/28/2018   Prediabetes 10/13/2018   Bilateral leg edema 09/12/2017   Bilateral primary  osteoarthritis of knee 12/22/2016   SOB (shortness of breath) on exertion 10/15/2016   Osteoporosis 03/26/2016   Diverticulosis of colon without hemorrhage 11/15/2013   Vitamin D  deficiency 06/30/2012   INTERSTITIAL CYSTITIS 10/09/2009   Constipation 12/13/2007   History of colonic polyps 12/13/2007   SYNCOPE 06/24/2006   PCP:  Geofm Glade PARAS, MD Pharmacy:   [id number] [address], [place] - [address] [place] [address] Phone: [phone] Fax: [phone]     Social Drivers of Health (SDOH) Social History: SDOH Screenings   Food Insecurity: No Food Insecurity (11/10/2023)  Housing: Low Risk  (11/10/2023)  Transportation Needs: No Transportation Needs (11/10/2023)  Utilities: Not At Risk (11/10/2023)  Alcohol Screen: Low Risk  (05/11/2023)  Depression (PHQ2-9): Low Risk  (05/11/2023)  Financial Resource Strain: Low Risk  (05/11/2023)  Physical Activity: Inactive (05/11/2023)  Social Connections: Moderately Isolated (11/10/2023)  Stress: No Stress Concern Present (05/11/2023)  Tobacco Use: Low Risk  (11/10/2023)  Health Literacy: Adequate Health Literacy (05/11/2023)   SDOH Interventions:     Readmission Risk Interventions     No data to display

## 2023-11-10 NOTE — Consult Note (Signed)
 Reason for Consult:L1 Fx Referring Physician: EDP  Olivia Evans is an 89 y.o. female.   HPI:  Very pleasant 89 year old female who fell yesterday about 315 when she missed a step.  She complains of back pain without leg pain or numbness tingling or weakness.  She had a CT scan and MRI in the emergency department that showed an L1 fracture.  Neurosurgical evaluation was requested.  Past Medical History:  Diagnosis Date   Arthritis KNEES   Bladder pain    Chronic constipation    Diverticulosis 2013   Dr Aneita   Dyslipidemia    Heart murmur    Hemorrhoids    History of adenomatous polyp of colon    2003 -- VILLOUS   History of breast cancer 2001---RIGHT BREAST CANCER   S/P PARTIAL MASTECTOMY AND RADIATION--  NO RECURRENCE   History of palpitations    Incomplete right bundle branch block (RBBB)    Interstitial cystitis    Dr McDiarmid   Lower urinary tract symptoms (LUTS)    Normal cardiac stress test    2000  PER MD NOTE   Osteopenia    Wears dentures    full -upper/  partial lower   Wears glasses     Past Surgical History:  Procedure Laterality Date   BENIGN TUMOR REMOVED  1960'S   SECOND RIB   CATARACT EXTRACTION W/ INTRAOCULAR LENS  IMPLANT, BILATERAL  feb 2016   CYSTO WITH HYDRODISTENSION  07/29/2011   Procedure: CYSTOSCOPY/HYDRODISTENSION;  Surgeon: Glendia DELENA Elizabeth, MD;  Location: [facility];  Service: Urology;  Laterality: N/A;  with fulguration of bladder ulcer   CYSTO WITH HYDRODISTENSION N/A 08/28/2014   Procedure: CYSTO/HYDRODISTENSION, FULGURATION OF ULCERS;  Surgeon: Glendia Elizabeth, MD;  Location: [facility] [place];  Service: Urology;  Laterality: N/A;   CYSTO/  HYDRODISTENTION/  INSTILLATION THERAPY  x2  01-18- and 11-15- 2011   CYSTO/ HOD/  BLADDER BX'S/  FULGERATION HUNNER ULCER'S /  INSTILLATION THERAPY  10-01-2010   PARTIAL MASTECTOMY W/ SLN DISSECTION Right 12-19-1999   ROTATOR CUFF REPAIR     TUBAL LIGATION     VAGINAL  HYSTERECTOMY  1970'S   WITH APPENDECTOMY   VARICOSE VEIN SURGERY      Allergies  Allergen Reactions   Premarin [Conjugated Estrogens] Other (See Comments)    TABS--  CAUSE PHLEBITIS   Vicodin [Hydrocodone -Acetaminophen ] Itching    SEVERE   Adhesive [Tape] Other (See Comments)    IRRITATION   Fluvastatin Sodium Other (See Comments)    HEART PALPITATIONS   Simvastatin Other (See Comments)    SEVERE LEG ACHES    Social History   Tobacco Use   Smoking status: Never   Smokeless tobacco: Never  Substance Use Topics   Alcohol use: No    Alcohol/week: 0.0 standard drinks of alcohol    Family History  Problem Relation Age of Onset   Heart disease Mother         CHF; no MI   Dementia Sister    Breast cancer Sister        S/P bilateral mastectomy   Stroke Brother 40   Diabetes Maternal Grandmother    Thyroid  disease Other        3 daughters   Colon cancer Neg Hx    Esophageal cancer Neg Hx    Stomach cancer Neg Hx    Rectal cancer Neg Hx      Review of Systems  Positive ROS: neg  All  other systems have been reviewed and were otherwise negative with the exception of those mentioned in the HPI and as above.  Objective: Vital signs in last 24 hours: Temp:  [98.3 F (36.8 C)-98.8 F (37.1 C)] 98.3 F (36.8 C) (10/21 0353) Pulse Rate:  [69-99] 72 (10/21 0700) Resp:  [14-26] 19 (10/21 0700) BP: (116-164)/(49-129) 137/53 (10/21 0700) SpO2:  [92 %-98 %] 92 % (10/21 0700) Weight:  [59 kg] 59 kg (10/20 2221)  General Appearance: Alert, cooperative, no distress, appears stated age Head: Normocephalic, without obvious abnormality, atraumatic Eyes: PERRL, conjunctiva/corneas clear, EOM's intact     Throat: benign Neck: Supple, symmetrical, trachea midline Lungs:  respirations unlabored Heart: Regular rate and rhythm Abdomen: Soft, non-tender Extremities: Extremities normal, atraumatic, no cyanosis or edema Pulses: 2+ and symmetric all extremities Skin: Skin color,  texture, turgor normal, no rashes or lesions  NEUROLOGIC:   Mental status: A&O x4, no aphasia, good attention span, Memory and fund of knowledge appear to be appropriate Motor Exam - grossly normal, normal tone and bulk Sensory Exam - grossly normal Reflexes: symmetric, no pathologic reflexes, No Hoffman's, No clonus Coordination - grossly normal Gait -not tested Balance -not tested Cranial Nerves: I: smell Not tested  II: visual acuity  OS: na  OD: na  II: visual fields Full to confrontation  II: pupils Equal, round, reactive to light  III,VII: ptosis None  III,IV,VI: extraocular muscles  Full ROM  V: mastication Normal  V: facial light touch sensation  Normal  V,VII: corneal reflex  Present  VII: facial muscle function - upper  Normal  VII: facial muscle function - lower Normal  VIII: hearing Not tested  IX: soft palate elevation  Normal  IX,X: gag reflex Present  XI: trapezius strength  5/5  XI: sternocleidomastoid strength 5/5  XI: neck flexion strength  5/5  XII: tongue strength  Normal    Data Review Lab Results  Component Value Date   WBC 4.8 11/10/2023   HGB 7.4 (L) 11/10/2023   HCT 22.5 (L) 11/10/2023   MCV 111.4 (H) 11/10/2023   PLT 192 11/10/2023   Lab Results  Component Value Date   NA 137 11/09/2023   K 3.9 11/09/2023   CL 102 11/09/2023   CO2 26 11/09/2023   BUN 12 11/09/2023   CREATININE 0.62 11/09/2023   GLUCOSE 142 (H) 11/09/2023   No results found for: INR, PROTIME  Radiology: MR LUMBAR SPINE WO CONTRAST Result Date: 11/10/2023 EXAM: MRI LUMBAR SPINE 11/10/2023 12:48:00 AM TECHNIQUE: Multiplanar multisequence MRI of the lumbar spine was performed without the administration of intravenous contrast. COMPARISON: Lumbar spine CT 11/09/2023. CLINICAL HISTORY: Low back pain, cauda equina syndrome suspected. FINDINGS: BONES AND ALIGNMENT: There is an acute incomplete burst fracture of L1 with 20% height loss and 5 mm of retropulsion. According to  the AO Spine classification of thoracolumbar injuries, the finding is consistent with an L1 A3 (incomplete burst fracture) injury. The recommendation is to use the AO Spine thoracolumbar injury score (TL AOSIS) to guide management, with a score of 3 points for A3 morphology. Further management depends on neurological status and modifiers, but an A3 injury alone typically falls in the 0-3 point range, suggesting conservative treatment if no additional risk factors are present. There is bone marrow edema underlying the superior L1 endplate. SPINAL CORD: The conus terminates normally. SOFT TISSUES: No paraspinal mass. T12-L1: There is narrowing of the right lateral recess due to retropulsion from the L1 fracture. L1-L2: No significant disc  herniation. No spinal canal stenosis or neural foraminal narrowing. L2-L3: Small left asymmetric disc bulge. No central spinal canal or neural foraminal stenosis. L3-L4: No significant disc herniation. No spinal canal stenosis or neural foraminal narrowing. L4-L5: Small disc bulge. No central spinal canal or neural foraminal stenosis. L5-S1: No significant disc herniation. No spinal canal stenosis or neural foraminal narrowing. IMPRESSION: 1. Acute incomplete burst fracture of L1 with approximately 20% height loss and 5 mm retropulsion, consistent with an L1 A3 injury. 2. Narrowing of the right lateral recess at T12-L1 due to retropulsion from the L1 fracture. 3. Small left asymmetric disc bulge at L2-3 and small disc bulge at L4-L5. Electronically signed by: Franky Stanford MD 11/10/2023 01:29 AM EDT RP Workstation: [id number]   CT Cervical Spine Wo Contrast Result Date: 11/10/2023 EXAM: CT CERVICAL SPINE WITHOUT CONTRAST 11/10/2023 12:14:42 AM TECHNIQUE: CT of the cervical spine was performed without the administration of intravenous contrast. Multiplanar reformatted images are provided for review. Automated exposure control, iterative reconstruction, and/or weight based  adjustment of the mA/kV was utilized to reduce the radiation dose to as low as reasonably achievable. COMPARISON: None available. CLINICAL HISTORY: Neck trauma (Age >= 65y). She reports she tripped when going down 2 steps and fell backwards and had been laying on the floor unable to get up for about 6 hours before her son found her. Denies head injury, no LOC, no ; blood thinners. She reports pain in her neck and coccyx. She arrives in a c-collar. She has bilateral leg weakness. FINDINGS: CERVICAL SPINE: BONES AND ALIGNMENT: No acute fracture or traumatic malalignment. DEGENERATIVE CHANGES: No significant degenerative changes. SOFT TISSUES: No prevertebral soft tissue swelling. LUNGS: Biapical pulmonary scarring. IMPRESSION: 1. No acute abnormality of the cervical spine. Electronically signed by: Franky Stanford MD 11/10/2023 12:23 AM EDT RP Workstation: [id number]   DG HIP UNILAT WITH PELVIS 2-3 VIEWS LEFT Result Date: 11/09/2023 CLINICAL DATA:  Recent fall with left hip pain, initial encounter EXAM: DG HIP (WITH OR WITHOUT PELVIS) 3V LEFT COMPARISON:  None Available. FINDINGS: Pelvic ring is intact. No acute fracture or dislocation is noted. No soft tissue changes are seen. IMPRESSION: No acute abnormality noted. Electronically Signed   By: Oneil Devonshire M.D.   On: 11/09/2023 23:48   DG HIP UNILAT WITH PELVIS 2-3 VIEWS RIGHT Result Date: 11/09/2023 CLINICAL DATA:  Recent fall with hip pain, initial encounter EXAM: DG HIP (WITH OR WITHOUT PELVIS) 3V RIGHT COMPARISON:  None Available. FINDINGS: Pelvic ring is intact. No acute fracture or dislocation is noted. No soft tissue abnormality is seen. IMPRESSION: No acute abnormality noted. Electronically Signed   By: Oneil Devonshire M.D.   On: 11/09/2023 23:48   CT Lumbar Spine Wo Contrast Result Date: 11/09/2023 EXAM: CT OF THE LUMBAR SPINE WITHOUT CONTRAST 11/09/2023 11:19:53 PM TECHNIQUE: CT of the lumbar spine was performed without the administration of  intravenous contrast. Multiplanar reformatted images are provided for review. Automated exposure control, iterative reconstruction, and/or weight based adjustment of the mA/kV was utilized to reduce the radiation dose to as low as reasonably achievable. COMPARISON: None available. CLINICAL HISTORY: Back trauma, no prior imaging (Age >= 16y). Pt is from home, found by her son laying on garage floor. She reports she tripped when going down 2 steps and fell backwards and had been laying on the floor unable to get up for about 6 hours before her son found her. Denies head injury, no LOC, no blood thinners. She reports pain in her neck and  coccyx. She arrives in a c-collar. She has bilateral leg weakness. + sensation. FINDINGS: BONES AND ALIGNMENT: Acute transverse fracture of the superior endplate of L1. Minimal vertebral body height loss. 5 mm of retropulsion of the superior endplate. The retropulsion is eccentric to the right and causes effacement of the right lateral recess. Mild spinal canal narrowing secondary to the retropulsion. The spinal canal is otherwise widely patent. Normal vertebral body heights are maintained at other visualized lumbar levels. DEGENERATIVE CHANGES: No significant degenerative changes. SOFT TISSUES: No acute abnormality. IMPRESSION: 1. Acute transverse fracture of the L1 superior endplate with minimal height loss and 5 mm right-eccentric retropulsion, causing effacement of the right lateral recess and mild spinal canal narrowing. Electronically signed by: Norman Gatlin MD 11/09/2023 11:29 PM EDT RP Workstation: [id number]   CT Thoracic Spine Wo Contrast Result Date: 11/09/2023 EXAM: CT THORACIC SPINE WITHOUT CONTRAST 11/09/2023 11:19:53 PM TECHNIQUE: CT of the thoracic spine was performed without the administration of intravenous contrast. Multiplanar reformatted images are provided for review. Automated exposure control, iterative reconstruction, and/or weight based adjustment of the  mA/kV was utilized to reduce the radiation dose to as low as reasonably achievable. COMPARISON: None available. CLINICAL HISTORY: Back trauma, no prior imaging (Age >= 16y). Pt is from home, found by her son laying on garage floor. She reports she tripped when going down 2 steps and fell backwards and had been laying on the floor unable to get up for about 6 hours before her son found her. Denies head injury, no LOC, no blood thinners. She reports pain in her neck and coccyx. She arrives in a c-collar. She has bilateral leg weakness. + sensation. FINDINGS: BONES AND ALIGNMENT: Normal vertebral body heights. No acute fracture in the thoracic spine. No suspicious bone lesion. Normal alignment. Lumbar spine reported separately. DEGENERATIVE CHANGES: Multilevel age related spondylosis. No severe spinal canal narrowing. SOFT TISSUES: No acute abnormality. LUNGS: Biapical pleural parenchymal scarring in the lungs. IMPRESSION: 1. No acute fracture of the thoracic spine. 2. Multilevel age-related spondylosis without severe spinal canal narrowing. Electronically signed by: Norman Gatlin MD 11/09/2023 11:26 PM EDT RP Workstation: [id number]   CT Head Wo Contrast Result Date: 11/09/2023 CLINICAL DATA:  Recent slip and fall with headaches, initial encounter EXAM: CT HEAD WITHOUT CONTRAST TECHNIQUE: Contiguous axial images were obtained from the base of the skull through the vertex without intravenous contrast. RADIATION DOSE REDUCTION: This exam was performed according to the departmental dose-optimization program which includes automated exposure control, adjustment of the mA and/or kV according to patient size and/or use of iterative reconstruction technique. COMPARISON:  None Available. FINDINGS: Brain: No evidence of acute infarction, hemorrhage, hydrocephalus, extra-axial collection or mass lesion/mass effect. Cavum septum pellucidum is noted. Mild chronic white matter ischemic changes are noted. Vascular: No  hyperdense vessel or unexpected calcification. Skull: Normal. Negative for fracture or focal lesion. Sinuses/Orbits: No acute finding. Other: None. IMPRESSION: Chronic ischemic changes without acute abnormality. Electronically Signed   By: Oneil Devonshire M.D.   On: 11/09/2023 23:22     Assessment/Plan: Estimated body mass index is 22.31 kg/m as calculated from the following:   Height as of this encounter: 5' 4 (1.626 m).   Weight as of this encounter: 59 kg.   Very pleasant 89 year old female with an L1 compression fracture from a fall.  I would recommend bracing and therapy.  She will likely have some progression of the fracture as she mobilizes and loads the fracture, and though she is a young 89,  she is still 89 years old and would likely not be a candidate for any type of surgical intervention.  She can don her brace sitting and she should wear her brace when she is out of bed for the next 3 months.  Please schedule follow-up with us  in the office 2 weeks after discharge.  Please call with any questions or concerns   Olivia Evans 11/10/2023 8:03 AM

## 2023-11-10 NOTE — Progress Notes (Signed)
 Transition of Care [facility]) - CAGE-AID Screening   Patient Details  Name: Olivia Evans MRN: [id number] Date of Birth: 1926-07-04   MARINDA LIONEL Sora, RN Phone Number: 11/10/2023, 2:28 AM   Clinical Narrative: Pt denies etoh/tobacco/elicit substance usage. No resources needed    CAGE-AID Screening:    Have You Ever Felt You Ought to Cut Down on Your Drinking or Drug Use?: No Have People Annoyed You By Critizing Your Drinking Or Drug Use?: No Have You Felt Bad Or Guilty About Your Drinking Or Drug Use?: No Have You Ever Had a Drink or Used Drugs First Thing In The Morning to Steady Your Nerves or to Get Rid of a Hangover?: No CAGE-AID Score: 0  Substance Abuse Education Offered: No

## 2023-11-10 NOTE — Progress Notes (Signed)
 Orthopedic Tech Progress Note Patient Details:  Olivia Evans 1926/02/18 [id number]  Ortho Devices Type of Ortho Device: Thoracolumbar corset (TLSO) Ortho Device/Splint Location: BACK Ortho Device/Splint Interventions: Ordered, Application, Adjustment, Removal   Post Interventions Patient Tolerated: Well  Delanna LITTIE Pac 11/10/2023, 8:37 AM

## 2023-11-10 NOTE — H&P (Signed)
 History and Physical    SHANICQUA COLDREN FMW:[id number] DOB: April 21, 1926 DOA: 11/09/2023  PCP: Geofm Glade PARAS, MD   Patient coming from: Home   Chief Complaint: Fall with low back pain   HPI: Olivia Evans is a 89 y.o. female with medical history significant for myelodysplastic syndrome, chronic HFpEF, and interstitial cystitis who presents with low back pain after a fall at home.  Patient was in her usual state when she was going down 2 steps into her garage, missed the bottom step, and fell backwards.  She denies any loss of consciousness.  She remained on the floor for several hours before she was found by her son.  She was complaining of neck pain on arrival but by time of admission is only experiencing pain in the low back.  She also notes that she has been urinating more frequently than usual but denies any difficulty voiding, new numbness, or new weakness.  She has not yet attempted to stand since the fall.  ED Course: Upon arrival to the ED, patient is found to be afebrile and saturating mid 90s on room air with normal RR, normal HR, and stable BP.  Labs are most notable for normal creatinine, slightly elevated serum CK, hemoglobin 8.9, and ketonuria.  Extensive imaging workup in the ED reveals acute incomplete burst fracture of L1 with approximately 20% height loss and 5 mm retropulsion which causes narrowing of the right lateral recess and mild canal narrowing.  Neurosurgery was consulted by the ED PA and the patient was treated with fentanyl  x 2 and 500 mL of NS.  Review of Systems:  All other systems reviewed and apart from HPI, are negative.  Past Medical History:  Diagnosis Date   Arthritis KNEES   Bladder pain    Chronic constipation    Diverticulosis 2013   Dr Aneita   Dyslipidemia    Heart murmur    Hemorrhoids    History of adenomatous polyp of colon    2003 -- VILLOUS   History of breast cancer 2001---RIGHT BREAST CANCER   S/P PARTIAL MASTECTOMY AND RADIATION--  NO  RECURRENCE   History of palpitations    Incomplete right bundle branch block (RBBB)    Interstitial cystitis    Dr McDiarmid   Lower urinary tract symptoms (LUTS)    Normal cardiac stress test    2000  PER MD NOTE   Osteopenia    Wears dentures    full -upper/  partial lower   Wears glasses     Past Surgical History:  Procedure Laterality Date   BENIGN TUMOR REMOVED  1960'S   SECOND RIB   CATARACT EXTRACTION W/ INTRAOCULAR LENS  IMPLANT, BILATERAL  feb 2016   CYSTO WITH HYDRODISTENSION  07/29/2011   Procedure: CYSTOSCOPY/HYDRODISTENSION;  Surgeon: Glendia DELENA Elizabeth, MD;  Location: [facility];  Service: Urology;  Laterality: N/A;  with fulguration of bladder ulcer   CYSTO WITH HYDRODISTENSION N/A 08/28/2014   Procedure: CYSTO/HYDRODISTENSION, FULGURATION OF ULCERS;  Surgeon: Glendia Elizabeth, MD;  Location: [facility];  Service: Urology;  Laterality: N/A;   CYSTO/  HYDRODISTENTION/  INSTILLATION THERAPY  x2  01-18- and 11-15- 2011   CYSTO/ HOD/  BLADDER BX'S/  FULGERATION HUNNER ULCER'S /  INSTILLATION THERAPY  10-01-2010   PARTIAL MASTECTOMY W/ SLN DISSECTION Right 12-19-1999   ROTATOR CUFF REPAIR     TUBAL LIGATION     VAGINAL HYSTERECTOMY  1970'S   WITH APPENDECTOMY   VARICOSE VEIN  SURGERY      Social History:   reports that she has never smoked. She has never used smokeless tobacco. She reports that she does not drink alcohol and does not use drugs.  Allergies  Allergen Reactions   Premarin [Conjugated Estrogens] Other (See Comments)    TABS--  CAUSE PHLEBITIS   Vicodin [Hydrocodone -Acetaminophen ] Itching    SEVERE   Adhesive [Tape] Other (See Comments)    IRRITATION   Fluvastatin Sodium Other (See Comments)    HEART PALPITATIONS   Simvastatin Other (See Comments)    SEVERE LEG ACHES    Family History  Problem Relation Age of Onset   Heart disease Mother         CHF; no MI   Dementia Sister    Breast cancer Sister        S/P  bilateral mastectomy   Stroke Brother 61   Diabetes Maternal Grandmother    Thyroid  disease Other        3 daughters   Colon cancer Neg Hx    Esophageal cancer Neg Hx    Stomach cancer Neg Hx    Rectal cancer Neg Hx      Prior to Admission medications   Medication Sig Start Date End Date Taking? Authorizing Provider  acetaminophen  (TYLENOL ) 325 MG tablet Take 325 mg by mouth every 8 (eight) hours as needed for moderate pain.   Yes [provider]  Apoaequorin (PREVAGEN PO) Take 1 capsule by mouth daily.   Yes [provider]  B Complex Vitamins (B COMPLEX-B12 PO) Take 1 tablet by mouth daily.   Yes [provider]  Cholecalciferol (VITAMIN D3) 1000 UNITS CAPS Take 4,000 Units by mouth daily.   Yes [provider]  Ferrous Sulfate (IRON PO) Take 1 tablet by mouth daily. Patient takes Mega-Food Blood Builder Iron Minis   Yes [provider]  Mag Aspart-Potassium Aspart 90-90 MG CAPS Take 1 tablet by mouth daily.   Yes [provider]  Multiple Vitamins-Minerals (PRESERVISION AREDS 2 PO) Take 1 capsule by mouth in the morning and at bedtime.   Yes [provider]  OVER THE COUNTER MEDICATION Take 1 tablet by mouth at bedtime as needed (laxitive). PruneLax   Yes [provider]  Polyethyl Glycol-Propyl Glycol (SYSTANE OP) Place 1 drop into both eyes daily. To both eyes.   Yes [provider]  torsemide  (DEMADEX ) 20 MG tablet TAKE 1 TABLET BY MOUTH EVERY DAY Patient taking differently: Take 20 mg by mouth every other day. 05/27/23  Yes Geofm Glade PARAS, MD    Physical Exam: Vitals:   11/10/23 0215 11/10/23 0230 11/10/23 0330 11/10/23 0353  BP: (!) 145/57 (!) 135/51 116/74   Pulse: 87 91 79   Resp: 17 (!) 26 19   Temp:    98.3 F (36.8 C)  TempSrc:    Oral  SpO2: 95% 94% 93%   Weight:      Height:        Constitutional: NAD, calm  Eyes: PERTLA, lids and conjunctivae normal ENMT: Mucous membranes are  moist. Posterior pharynx clear of any exudate or lesions.   Neck: supple, no masses  Respiratory: no wheezing, no crackles. No accessory muscle use.  Cardiovascular: S1 & S2 heard, regular rate and rhythm. Pretibial pitting edema.   Abdomen: No no tenderness, soft. Bowel sounds active.  Musculoskeletal: no clubbing / cyanosis. No joint deformity upper and lower extremities. Low back elicited by right hip flexion.  Skin: no significant rashes, lesions, ulcers. Warm, dry, well-perfused. Neurologic: CN 2-12 grossly intact. Sensation to light touch intact. Proximal RLE strength testing is limited by back pain with hip flexion. Strength 5/5 in all 4 limbs. Alert and oriented to person, place, and situation.  Psychiatric: Pleasant. Cooperative.    Labs and Imaging on Admission: I have personally reviewed following labs and imaging studies  CBC: Recent Labs  Lab 11/09/23 2300  WBC 8.8  HGB 8.9*  HCT 27.1*  MCV 112.4*  PLT 265   Basic Metabolic Panel: Recent Labs  Lab 11/09/23 2300  NA 137  K 3.9  CL 102  CO2 26  GLUCOSE 142*  BUN 12  CREATININE 0.62  CALCIUM 9.6   GFR: Estimated Creatinine Clearance: 36.3 mL/min (by C-G formula based on SCr of 0.62 mg/dL). Liver Function Tests: No results for input(s): AST, ALT, ALKPHOS, BILITOT, PROT, ALBUMIN in the last 168 hours. No results for input(s): LIPASE, AMYLASE in the last 168 hours. No results for input(s): AMMONIA in the last 168 hours. Coagulation Profile: No results for input(s): INR, PROTIME in the last 168 hours. Cardiac Enzymes: Recent Labs  Lab 11/09/23 2300  CKTOTAL 304*   BNP (last 3 results) No results for input(s): PROBNP in the last 8760 hours. HbA1C: No results for input(s): HGBA1C in the last 72 hours. CBG: No results for input(s): GLUCAP in the last 168 hours. Lipid Profile: No results for input(s): CHOL, HDL, LDLCALC, TRIG, CHOLHDL, LDLDIRECT in the last 72  hours. Thyroid  Function Tests: No results for input(s): TSH, T4TOTAL, FREET4, T3FREE, THYROIDAB in the last 72 hours. Anemia Panel: No results for input(s): VITAMINB12, FOLATE, FERRITIN, TIBC, IRON, RETICCTPCT in the last 72 hours. Urine analysis:    Component Value Date/Time   COLORURINE YELLOW 11/10/2023 0025   APPEARANCEUR CLEAR 11/10/2023 0025   LABSPEC 1.012 11/10/2023 0025   PHURINE 8.0 11/10/2023 0025   GLUCOSEU NEGATIVE 11/10/2023 0025   HGBUR NEGATIVE 11/10/2023 0025   HGBUR moderate 10/09/2009 0923   BILIRUBINUR NEGATIVE 11/10/2023 0025   KETONESUR 20 (A) 11/10/2023 0025   PROTEINUR NEGATIVE 11/10/2023 0025   UROBILINOGEN 0.2 10/09/2009 0923   NITRITE NEGATIVE 11/10/2023 0025   LEUKOCYTESUR NEGATIVE 11/10/2023 0025   Sepsis Labs: @LABRCNTIP (procalcitonin:4,lacticidven:4) )No results found for this or any previous visit (from the past 240 hours).   Radiological Exams on Admission: MR LUMBAR SPINE WO CONTRAST Result Date: 11/10/2023 EXAM: MRI LUMBAR SPINE 11/10/2023 12:48:00 AM TECHNIQUE: Multiplanar multisequence MRI of the lumbar spine was performed without the administration of intravenous contrast. COMPARISON: Lumbar spine CT 11/09/2023. CLINICAL HISTORY: Low back pain, cauda equina syndrome suspected. FINDINGS: BONES AND ALIGNMENT: There is an acute incomplete burst fracture of L1 with 20% height loss and 5 mm of retropulsion. According to the AO Spine classification of thoracolumbar injuries, the finding is consistent with an L1 A3 (incomplete burst fracture) injury. The recommendation is to use the AO Spine thoracolumbar injury score (TL AOSIS) to guide management, with a score of 3 points for A3 morphology. Further management depends on neurological status and modifiers, but an A3 injury alone typically falls in the 0-3 point range, suggesting conservative treatment if no additional risk factors are present. There is bone marrow edema underlying the  superior L1 endplate. SPINAL CORD: The conus terminates normally. SOFT TISSUES: No paraspinal mass. T12-L1: There is narrowing of the right lateral recess due to retropulsion from the L1 fracture. L1-L2: No significant disc herniation. No spinal canal stenosis or neural foraminal narrowing.  L2-L3: Small left asymmetric disc bulge. No central spinal canal or neural foraminal stenosis. L3-L4: No significant disc herniation. No spinal canal stenosis or neural foraminal narrowing. L4-L5: Small disc bulge. No central spinal canal or neural foraminal stenosis. L5-S1: No significant disc herniation. No spinal canal stenosis or neural foraminal narrowing. IMPRESSION: 1. Acute incomplete burst fracture of L1 with approximately 20% height loss and 5 mm retropulsion, consistent with an L1 A3 injury. 2. Narrowing of the right lateral recess at T12-L1 due to retropulsion from the L1 fracture. 3. Small left asymmetric disc bulge at L2-3 and small disc bulge at L4-L5. Electronically signed by: Franky Stanford MD 11/10/2023 01:29 AM EDT RP Workstation: [id number]   CT Cervical Spine Wo Contrast Result Date: 11/10/2023 EXAM: CT CERVICAL SPINE WITHOUT CONTRAST 11/10/2023 12:14:42 AM TECHNIQUE: CT of the cervical spine was performed without the administration of intravenous contrast. Multiplanar reformatted images are provided for review. Automated exposure control, iterative reconstruction, and/or weight based adjustment of the mA/kV was utilized to reduce the radiation dose to as low as reasonably achievable. COMPARISON: None available. CLINICAL HISTORY: Neck trauma (Age >= 65y). She reports she tripped when going down 2 steps and fell backwards and had been laying on the floor unable to get up for about 6 hours before her son found her. Denies head injury, no LOC, no ; blood thinners. She reports pain in her neck and coccyx. She arrives in a c-collar. She has bilateral leg weakness. FINDINGS: CERVICAL SPINE: BONES AND ALIGNMENT:  No acute fracture or traumatic malalignment. DEGENERATIVE CHANGES: No significant degenerative changes. SOFT TISSUES: No prevertebral soft tissue swelling. LUNGS: Biapical pulmonary scarring. IMPRESSION: 1. No acute abnormality of the cervical spine. Electronically signed by: Franky Stanford MD 11/10/2023 12:23 AM EDT RP Workstation: [id number]   DG HIP UNILAT WITH PELVIS 2-3 VIEWS LEFT Result Date: 11/09/2023 CLINICAL DATA:  Recent fall with left hip pain, initial encounter EXAM: DG HIP (WITH OR WITHOUT PELVIS) 3V LEFT COMPARISON:  None Available. FINDINGS: Pelvic ring is intact. No acute fracture or dislocation is noted. No soft tissue changes are seen. IMPRESSION: No acute abnormality noted. Electronically Signed   By: Oneil Devonshire M.D.   On: 11/09/2023 23:48   DG HIP UNILAT WITH PELVIS 2-3 VIEWS RIGHT Result Date: 11/09/2023 CLINICAL DATA:  Recent fall with hip pain, initial encounter EXAM: DG HIP (WITH OR WITHOUT PELVIS) 3V RIGHT COMPARISON:  None Available. FINDINGS: Pelvic ring is intact. No acute fracture or dislocation is noted. No soft tissue abnormality is seen. IMPRESSION: No acute abnormality noted. Electronically Signed   By: Oneil Devonshire M.D.   On: 11/09/2023 23:48   CT Lumbar Spine Wo Contrast Result Date: 11/09/2023 EXAM: CT OF THE LUMBAR SPINE WITHOUT CONTRAST 11/09/2023 11:19:53 PM TECHNIQUE: CT of the lumbar spine was performed without the administration of intravenous contrast. Multiplanar reformatted images are provided for review. Automated exposure control, iterative reconstruction, and/or weight based adjustment of the mA/kV was utilized to reduce the radiation dose to as low as reasonably achievable. COMPARISON: None available. CLINICAL HISTORY: Back trauma, no prior imaging (Age >= 16y). Pt is from home, found by her son laying on garage floor. She reports she tripped when going down 2 steps and fell backwards and had been laying on the floor unable to get up for about 6 hours  before her son found her. Denies head injury, no LOC, no blood thinners. She reports pain in her neck and coccyx. She arrives in a c-collar. She has bilateral  leg weakness. + sensation. FINDINGS: BONES AND ALIGNMENT: Acute transverse fracture of the superior endplate of L1. Minimal vertebral body height loss. 5 mm of retropulsion of the superior endplate. The retropulsion is eccentric to the right and causes effacement of the right lateral recess. Mild spinal canal narrowing secondary to the retropulsion. The spinal canal is otherwise widely patent. Normal vertebral body heights are maintained at other visualized lumbar levels. DEGENERATIVE CHANGES: No significant degenerative changes. SOFT TISSUES: No acute abnormality. IMPRESSION: 1. Acute transverse fracture of the L1 superior endplate with minimal height loss and 5 mm right-eccentric retropulsion, causing effacement of the right lateral recess and mild spinal canal narrowing. Electronically signed by: Norman Gatlin MD 11/09/2023 11:29 PM EDT RP Workstation: [id number]   CT Thoracic Spine Wo Contrast Result Date: 11/09/2023 EXAM: CT THORACIC SPINE WITHOUT CONTRAST 11/09/2023 11:19:53 PM TECHNIQUE: CT of the thoracic spine was performed without the administration of intravenous contrast. Multiplanar reformatted images are provided for review. Automated exposure control, iterative reconstruction, and/or weight based adjustment of the mA/kV was utilized to reduce the radiation dose to as low as reasonably achievable. COMPARISON: None available. CLINICAL HISTORY: Back trauma, no prior imaging (Age >= 18y). Pt is from home, found by her son laying on garage floor. She reports she tripped when going down 2 steps and fell backwards and had been laying on the floor unable to get up for about 6 hours before her son found her. Denies head injury, no LOC, no blood thinners. She reports pain in her neck and coccyx. She arrives in a c-collar. She has bilateral leg  weakness. + sensation. FINDINGS: BONES AND ALIGNMENT: Normal vertebral body heights. No acute fracture in the thoracic spine. No suspicious bone lesion. Normal alignment. Lumbar spine reported separately. DEGENERATIVE CHANGES: Multilevel age related spondylosis. No severe spinal canal narrowing. SOFT TISSUES: No acute abnormality. LUNGS: Biapical pleural parenchymal scarring in the lungs. IMPRESSION: 1. No acute fracture of the thoracic spine. 2. Multilevel age-related spondylosis without severe spinal canal narrowing. Electronically signed by: Norman Gatlin MD 11/09/2023 11:26 PM EDT RP Workstation: [id number]   CT Head Wo Contrast Result Date: 11/09/2023 CLINICAL DATA:  Recent slip and fall with headaches, initial encounter EXAM: CT HEAD WITHOUT CONTRAST TECHNIQUE: Contiguous axial images were obtained from the base of the skull through the vertex without intravenous contrast. RADIATION DOSE REDUCTION: This exam was performed according to the departmental dose-optimization program which includes automated exposure control, adjustment of the mA and/or kV according to patient size and/or use of iterative reconstruction technique. COMPARISON:  None Available. FINDINGS: Brain: No evidence of acute infarction, hemorrhage, hydrocephalus, extra-axial collection or mass lesion/mass effect. Cavum septum pellucidum is noted. Mild chronic white matter ischemic changes are noted. Vascular: No hyperdense vessel or unexpected calcification. Skull: Normal. Negative for fracture or focal lesion. Sinuses/Orbits: No acute finding. Other: None. IMPRESSION: Chronic ischemic changes without acute abnormality. Electronically Signed   By: Oneil Devonshire M.D.   On: 11/09/2023 23:22    Assessment/Plan   1. Closed L1 fracture  - Neurologically intact  - Continue pain-control and TLSO brace, consult PT and OT    2. MDS  - Under observation, follows with Dr. Sherrod  - Stable   3. Chronic HFpEF  - EF was preserved on echo  from October 2023  - Appears compensated, continue torsemide  every other day    DVT prophylaxis: SCDs Code Status: DNR/DNI, discussed with patient and her daughter in ED  Level of Care: Level of care: Med-Surg Family  Communication: Daughter at bedside  Disposition Plan:  Patient is from: Home  Anticipated d/c is to: TBD Anticipated d/c date is: 11/11/23  Patient currently: Pending pain-control, PT and OT evaluation, disposition planning  Consults called: Neurosurgery  Admission status: Observation     Evalene GORMAN Sprinkles, MD [facility]  11/10/2023, 4:28 AM

## 2023-11-11 ENCOUNTER — Encounter: Payer: PPO | Admitting: Internal Medicine

## 2023-11-11 DIAGNOSIS — S32011A Stable burst fracture of first lumbar vertebra, initial encounter for closed fracture: Secondary | ICD-10-CM | POA: Diagnosis not present

## 2023-11-11 LAB — CBC
HCT: 22.8 % — ABNORMAL LOW (ref 36.0–46.0)
Hemoglobin: 7.6 g/dL — ABNORMAL LOW (ref 12.0–15.0)
MCH: 37.3 pg — ABNORMAL HIGH (ref 26.0–34.0)
MCHC: 33.3 g/dL (ref 30.0–36.0)
MCV: 111.8 fL — ABNORMAL HIGH (ref 80.0–100.0)
Platelets: 213 K/uL (ref 150–400)
RBC: 2.04 MIL/uL — ABNORMAL LOW (ref 3.87–5.11)
RDW: 15.1 % (ref 11.5–15.5)
WBC: 4.8 K/uL (ref 4.0–10.5)
nRBC: 0 % (ref 0.0–0.2)

## 2023-11-11 LAB — BASIC METABOLIC PANEL WITH GFR
Anion gap: 10 (ref 5–15)
BUN: 10 mg/dL (ref 8–23)
CO2: 25 mmol/L (ref 22–32)
Calcium: 9 mg/dL (ref 8.9–10.3)
Chloride: 105 mmol/L (ref 98–111)
Creatinine, Ser: 0.63 mg/dL (ref 0.44–1.00)
GFR, Estimated: >60 mL/min (ref >60)
Glucose, Bld: 107 mg/dL — ABNORMAL HIGH (ref 70–99)
Potassium: 3.9 mmol/L (ref 3.5–5.1)
Sodium: 140 mmol/L (ref 135–145)

## 2023-11-11 MED ORDER — OXYCODONE HCL ER 10 MG PO T12A
10.0000 mg | EXTENDED_RELEASE_TABLET | Freq: Two times a day (BID) | ORAL | Status: DC
Start: 2023-11-11 — End: 2023-11-20
  Administered 2023-11-11 – 2023-11-20 (×18): 10 mg via ORAL
  Filled 2023-11-11 (×19): qty 1

## 2023-11-11 NOTE — Plan of Care (Signed)

## 2023-11-11 NOTE — NC FL2 (Signed)
 [place]  MEDICAID FL2 LEVEL OF CARE FORM     IDENTIFICATION  Patient Name: Olivia Evans Birthdate: [birth year] Sex: female Admission Date (Current Location): 11/09/2023  [facility] and [place] Number:  Producer, television/film/video and Address:  [facility] [place], [address] [place], [place] [address]      Provider Number: [id number]  Attending Physician Name and Address:  Dino Antu, MD  Relative Name and Phone Number:       Current Level of Care: Hospital Recommended Level of Care: Skilled Nursing Facility Prior Approval Number:    Date Approved/Denied:   PASRR Number: [id number]  Discharge Plan: SNF    Current Diagnoses: Patient Active Problem List   Diagnosis Date Noted   Closed L1 vertebral fracture (HCC) 11/10/2023   Chronic heart failure with preserved ejection fraction (HFpEF) (HCC) 11/10/2023   Pain and swelling of lower extremity, right 01/16/2022   Murmur 11/06/2021   Myelodysplasia (myelodysplastic syndrome) (HCC) 11/05/2021   Neck pain 10/07/2021   Occipital neuralgia of left side 10/04/2021   Decreased hearing of both ears 03/08/2021   Bicytopenia 10/23/2020   History of breast cancer on right 10/16/2020   Anemia 11/28/2018   Prediabetes 10/13/2018   Bilateral leg edema 09/12/2017   Bilateral primary osteoarthritis of knee 12/22/2016   SOB (shortness of breath) on exertion 10/15/2016   Osteoporosis 03/26/2016   Diverticulosis of colon without hemorrhage 11/15/2013   Vitamin D  deficiency 06/30/2012   INTERSTITIAL CYSTITIS 10/09/2009   Constipation 12/13/2007   History of colonic polyps 12/13/2007   SYNCOPE 06/24/2006    Orientation RESPIRATION BLADDER Height & Weight     Self, Time, Situation  Normal Incontinent Weight: 130 lb (59 kg) Height:  5' 4 (162.6 cm)  BEHAVIORAL SYMPTOMS/MOOD NEUROLOGICAL BOWEL NUTRITION STATUS      Continent Diet (see dc summary)  AMBULATORY STATUS COMMUNICATION OF NEEDS Skin   Extensive  Assist Verbally                         Personal Care Assistance Level of Assistance  Bathing, Feeding, Dressing Bathing Assistance: Maximum assistance Feeding assistance: Limited assistance Dressing Assistance: Maximum assistance     Functional Limitations Info  Sight, Hearing, Speech Sight Info: Adequate Hearing Info: Adequate Speech Info: Adequate    SPECIAL CARE FACTORS FREQUENCY  PT (By licensed PT), OT (By licensed OT)     PT Frequency: 5x weekly OT Frequency: 5x weekly            Contractures Contractures Info: Not present    Additional Factors Info  Code Status, Allergies Code Status Info: DNR-Limited Allergies Info: Premarin (Conjugated Estrogens), Vicodin (Hydrocodone -acetaminophen ), Adhesive (Tape), Fluvastatin Sodium, Simvastatin           Current Medications (11/11/2023):  This is the current hospital active medication list Current Facility-Administered Medications  Medication Dose Route Frequency Provider Last Rate Last Admin   acetaminophen  (TYLENOL ) tablet 1,000 mg  1,000 mg Oral Q8H Reome, Earle J, RPH   1,000 mg at 11/11/23 1013   bisacodyl (DULCOLAX) EC tablet 5 mg  5 mg Oral Daily PRN Opyd, Timothy S, MD       calcitonin (salmon) (MIACALCIN/FORTICAL) nasal spray 1 spray  1 spray Alternating Nares Daily Perri DELENA Meliton Mickey., MD   1 spray at 11/11/23 1013   fentaNYL  (SUBLIMAZE ) injection 25-50 mcg  25-50 mcg Intravenous Q2H PRN Opyd, Timothy S, MD   50 mcg at 11/11/23 1444  ferrous sulfate tablet 325 mg  325 mg Oral QODAY Powell, A Caldwell Jr., MD   325 mg at 11/10/23 1105   lidocaine  (LIDODERM ) 5 % 2 patch  2 patch Transdermal Q24H Perri DELENA Meliton Mickey., MD   2 patch at 11/11/23 1016   methocarbamol (ROBAXIN) tablet 500 mg  500 mg Oral Q6H PRN Opyd, Timothy S, MD   500 mg at 11/10/23 9587   oxyCODONE  (Oxy IR/ROXICODONE ) immediate release tablet 2.5 mg  2.5 mg Oral Q4H PRN Perri DELENA Meliton Mickey., MD       Or   oxyCODONE  (Oxy IR/ROXICODONE )  immediate release tablet 5 mg  5 mg Oral Q4H PRN Perri DELENA Meliton Mickey., MD       oxyCODONE  (OXYCONTIN ) 12 hr tablet 10 mg  10 mg Oral Q12H Rashid, Farhan, MD   10 mg at 11/11/23 1013   prochlorperazine (COMPAZINE) injection 5 mg  5 mg Intravenous Q6H PRN Opyd, Evalene RAMAN, MD       senna-docusate (Senokot-S) tablet 1 tablet  1 tablet Oral QHS PRN Opyd, Timothy S, MD       torsemide  (DEMADEX ) tablet 20 mg  20 mg Oral QODAY Opyd, Timothy S, MD   20 mg at 11/11/23 1013     Discharge Medications: Please see discharge summary for a list of discharge medications.  Relevant Imaging Results:  Relevant Lab Results:   Additional Information SSN: [id number];  Pt uses a back brace.  Sherline Clack, LCSWA

## 2023-11-11 NOTE — Care Management Obs Status (Signed)
 MEDICARE OBSERVATION STATUS NOTIFICATION   Patient Details  Name: Olivia Evans MRN: [id number] Date of Birth: 04/25/1926   Medicare Observation Status Notification Given:  Yes  Verbally reviewed observation notice with Consuelo Hock telephonically at [phone].  Will deliver a copy to the patient room.   Ione Sandusky 11/11/2023, 8:49 AM

## 2023-11-11 NOTE — TOC Progression Note (Signed)
 Transition of Care [facility]) - Progression Note    Patient Details  Name: Olivia Evans MRN: [id number] Date of Birth: [birth year]  Transition of Care [facility]) CM/SW Contact  Sherline Clack, [place] Phone Number: 11/11/2023, 4:07 PM  Clinical Narrative:     CSW filled out FL2 and faxed out patient's information to facilities in the hub. CSW will continue to follow an update DC plan.   Expected Discharge Plan: Skilled Nursing Facility Barriers to Discharge: Continued Medical Work up               Expected Discharge Plan and Services In-house Referral: Clinical Social Work Discharge Planning Services: CM Consult Post Acute Care Choice: Skilled Nursing Facility Living arrangements for the past 2 months: Single Family Home                                       Social Drivers of Health (SDOH) Interventions SDOH Screenings   Food Insecurity: No Food Insecurity (11/10/2023)  Housing: Low Risk  (11/10/2023)  Transportation Needs: No Transportation Needs (11/10/2023)  Utilities: Not At Risk (11/10/2023)  Alcohol Screen: Low Risk  (05/11/2023)  Depression (PHQ2-9): Low Risk  (05/11/2023)  Financial Resource Strain: Low Risk  (05/11/2023)  Physical Activity: Inactive (05/11/2023)  Social Connections: Moderately Isolated (11/10/2023)  Stress: No Stress Concern Present (05/11/2023)  Tobacco Use: Low Risk  (11/10/2023)  Health Literacy: Adequate Health Literacy (05/11/2023)    Readmission Risk Interventions     No data to display

## 2023-11-11 NOTE — Progress Notes (Addendum)
 PROGRESS NOTE    Olivia Evans  FMW:[id number] DOB: 1926-10-26 DOA: 11/09/2023 PCP: Geofm Glade PARAS, MD   Brief Narrative:   89 y.o. female with medical history significant for myelodysplastic syndrome, chronic HFpEF, and interstitial cystitis who presents with low back pain after a fall at home.   Seen by NSGY, recommending TLSO when OOB x3 months.  Outpatient NSGY f/u in 2 weeks.   Pending placement to skilled nursing facility/rehab.  Patient is medically ready  Assessment & Plan:  Principal Problem:   Closed L1 vertebral fracture (HCC) Active Problems:   Myelodysplasia (myelodysplastic syndrome) (HCC)   Chronic heart failure with preserved ejection fraction (HFpEF) (HCC)   Acute Incomplete Burst Fracture of L1, POA: - MRI showing incomplete L1 burst fracture with 20% height loss and 5 mm retropulsion c/w L1 A3 injury - narrowing of R lateral recess at T12-L1 due to retropulsion from L1 fracture.  Small L asymmetric disc bulge at L2-3 and small disc bulge at L4-L5 - appreciate nsgy recs, recommending TLSO brace when OOB x3 months (don brace sitting and wear when out of bed) - follow up in 2 weeks (nsgy note 10/21, Dr. Joshua) - Oxycodone  added long-acting 10 mg every 12 hours.  Continue with as needed oxycodone  for breakthrough pain. - PT and OT on board.  Recommendation is skilled nursing facility placement.   MDS  - Under observation, follows with Dr. Sherrod  - Stable  - trend H/H while admitted   Chronic HFpEF: Not in exacerbation - EF was preserved on echo from October 2023  - Appears compensated, continue torsemide  every other day   Irritable bladder syndrome, POA: Outpatient follow-up with urology. Patient does have acute issues with urinary incontinence.   Disposition: Pending skilled nursing facility placement   DVT prophylaxis: SCDs Start: 11/10/23 0428     Code Status: Limited: Do not attempt resuscitation (DNR) -DNR-LIMITED -Do Not Intubate/DNI  Family  Communication: Daughter is present at the bedside Status is: Observation The patient remains OBS appropriate and will d/c before 2 midnights.    Subjective:  She said that she had terrible pain last night but her pain is much better now.  We talked about her TLSO brace as well.  Patient's daughter was present at bedside.  We spoke about adding long-acting oxycodone  for better pain control. Daughter also spoke to me about her urinary incontinence.  Patient has history of irritable bladder syndrome and used to follow-up outpatient with urology but symptoms have been well-controlled until this L1 fracture happened.  Examination:  General exam: Appears calm and comfortable  Respiratory system: Clear to auscultation. Respiratory effort normal. Cardiovascular system: S1 & S2 heard, RRR. No JVD, murmurs, rubs, gallops or clicks. No pedal edema. Gastrointestinal system: Abdomen is nondistended, soft and nontender. No organomegaly or masses felt. Normal bowel sounds heard. Central nervous system: Alert and oriented. No focal neurological deficits. Extremities: Symmetric 5 x 5 power. Skin: No rashes, lesions or ulcers Psychiatry: Judgement and insight appear normal. Mood & affect appropriate.     Diet Orders (From admission, onward)     Start     Ordered   11/10/23 0428  Diet regular Room service appropriate? Yes; Fluid consistency: Thin  Diet effective now       Question Answer Comment  Room service appropriate? Yes   Fluid consistency: Thin      11/10/23 0428            Objective: Vitals:   11/10/23 1555 11/10/23 2106  11/11/23 0100 11/11/23 0731  BP: (!) 153/87 (!) 159/48 (!) 152/59 (!) 125/54  Pulse: 80 81 79 79  Resp: 18     Temp:  98.8 F (37.1 C) 98 F (36.7 C) 98.1 F (36.7 C)  TempSrc:    Oral  SpO2: 92% 94% 97% 96%  Weight:      Height:       No intake or output data in the 24 hours ending 11/11/23 0948 Filed Weights   11/09/23 2221  Weight: 59 kg     Scheduled Meds:  acetaminophen   1,000 mg Oral Q8H   calcitonin (salmon)  1 spray Alternating Nares Daily   ferrous sulfate  325 mg Oral QODAY   lidocaine   2 patch Transdermal Q24H   oxyCODONE   10 mg Oral Q12H   torsemide   20 mg Oral QODAY   Continuous Infusions:  Nutritional status     Body mass index is 22.31 kg/m.  Data Reviewed:   CBC: Recent Labs  Lab 11/09/23 2300 11/10/23 0555 11/10/23 0800 11/11/23 0430  WBC 8.8 4.8  --  4.8  HGB 8.9* 7.4* 7.9* 7.6*  HCT 27.1* 22.5* 23.6* 22.8*  MCV 112.4* 111.4*  --  111.8*  PLT 265 192  --  213   Basic Metabolic Panel: Recent Labs  Lab 11/09/23 2300 11/10/23 0555 11/11/23 0430  NA 137 136 140  K 3.9 3.9 3.9  CL 102 104 105  CO2 26 27 25   GLUCOSE 142* 111* 107*  BUN 12 13 10   CREATININE 0.62 0.64 0.63  CALCIUM 9.6 8.9 9.0   GFR: Estimated Creatinine Clearance: 36.3 mL/min (by C-G formula based on SCr of 0.63 mg/dL). Liver Function Tests: No results for input(s): AST, ALT, ALKPHOS, BILITOT, PROT, ALBUMIN in the last 168 hours. No results for input(s): LIPASE, AMYLASE in the last 168 hours. No results for input(s): AMMONIA in the last 168 hours. Coagulation Profile: No results for input(s): INR, PROTIME in the last 168 hours. Cardiac Enzymes: Recent Labs  Lab 11/09/23 2300  CKTOTAL 304*   BNP (last 3 results) No results for input(s): PROBNP in the last 8760 hours. HbA1C: No results for input(s): HGBA1C in the last 72 hours. CBG: No results for input(s): GLUCAP in the last 168 hours. Lipid Profile: No results for input(s): CHOL, HDL, LDLCALC, TRIG, CHOLHDL, LDLDIRECT in the last 72 hours. Thyroid  Function Tests: No results for input(s): TSH, T4TOTAL, FREET4, T3FREE, THYROIDAB in the last 72 hours. Anemia Panel: No results for input(s): VITAMINB12, FOLATE, FERRITIN, TIBC, IRON, RETICCTPCT in the last 72 hours. Sepsis Labs: No results for  input(s): PROCALCITON, LATICACIDVEN in the last 168 hours.  No results found for this or any previous visit (from the past 240 hours).       Radiology Studies: MR LUMBAR SPINE WO CONTRAST Result Date: 11/10/2023 EXAM: MRI LUMBAR SPINE 11/10/2023 12:48:00 AM TECHNIQUE: Multiplanar multisequence MRI of the lumbar spine was performed without the administration of intravenous contrast. COMPARISON: Lumbar spine CT 11/09/2023. CLINICAL HISTORY: Low back pain, cauda equina syndrome suspected. FINDINGS: BONES AND ALIGNMENT: There is an acute incomplete burst fracture of L1 with 20% height loss and 5 mm of retropulsion. According to the AO Spine classification of thoracolumbar injuries, the finding is consistent with an L1 A3 (incomplete burst fracture) injury. The recommendation is to use the AO Spine thoracolumbar injury score (TL AOSIS) to guide management, with a score of 3 points for A3 morphology. Further management depends on neurological status and modifiers, but an  A3 injury alone typically falls in the 0-3 point range, suggesting conservative treatment if no additional risk factors are present. There is bone marrow edema underlying the superior L1 endplate. SPINAL CORD: The conus terminates normally. SOFT TISSUES: No paraspinal mass. T12-L1: There is narrowing of the right lateral recess due to retropulsion from the L1 fracture. L1-L2: No significant disc herniation. No spinal canal stenosis or neural foraminal narrowing. L2-L3: Small left asymmetric disc bulge. No central spinal canal or neural foraminal stenosis. L3-L4: No significant disc herniation. No spinal canal stenosis or neural foraminal narrowing. L4-L5: Small disc bulge. No central spinal canal or neural foraminal stenosis. L5-S1: No significant disc herniation. No spinal canal stenosis or neural foraminal narrowing. IMPRESSION: 1. Acute incomplete burst fracture of L1 with approximately 20% height loss and 5 mm retropulsion, consistent  with an L1 A3 injury. 2. Narrowing of the right lateral recess at T12-L1 due to retropulsion from the L1 fracture. 3. Small left asymmetric disc bulge at L2-3 and small disc bulge at L4-L5. Electronically signed by: Franky Stanford MD 11/10/2023 01:29 AM EDT RP Workstation: [id number]   CT Cervical Spine Wo Contrast Result Date: 11/10/2023 EXAM: CT CERVICAL SPINE WITHOUT CONTRAST 11/10/2023 12:14:42 AM TECHNIQUE: CT of the cervical spine was performed without the administration of intravenous contrast. Multiplanar reformatted images are provided for review. Automated exposure control, iterative reconstruction, and/or weight based adjustment of the mA/kV was utilized to reduce the radiation dose to as low as reasonably achievable. COMPARISON: None available. CLINICAL HISTORY: Neck trauma (Age >= 65y). She reports she tripped when going down 2 steps and fell backwards and had been laying on the floor unable to get up for about 6 hours before her son found her. Denies head injury, no LOC, no ; blood thinners. She reports pain in her neck and coccyx. She arrives in a c-collar. She has bilateral leg weakness. FINDINGS: CERVICAL SPINE: BONES AND ALIGNMENT: No acute fracture or traumatic malalignment. DEGENERATIVE CHANGES: No significant degenerative changes. SOFT TISSUES: No prevertebral soft tissue swelling. LUNGS: Biapical pulmonary scarring. IMPRESSION: 1. No acute abnormality of the cervical spine. Electronically signed by: Franky Stanford MD 11/10/2023 12:23 AM EDT RP Workstation: [id number]   DG HIP UNILAT WITH PELVIS 2-3 VIEWS LEFT Result Date: 11/09/2023 CLINICAL DATA:  Recent fall with left hip pain, initial encounter EXAM: DG HIP (WITH OR WITHOUT PELVIS) 3V LEFT COMPARISON:  None Available. FINDINGS: Pelvic ring is intact. No acute fracture or dislocation is noted. No soft tissue changes are seen. IMPRESSION: No acute abnormality noted. Electronically Signed   By: Oneil Devonshire M.D.   On: 11/09/2023 23:48    DG HIP UNILAT WITH PELVIS 2-3 VIEWS RIGHT Result Date: 11/09/2023 CLINICAL DATA:  Recent fall with hip pain, initial encounter EXAM: DG HIP (WITH OR WITHOUT PELVIS) 3V RIGHT COMPARISON:  None Available. FINDINGS: Pelvic ring is intact. No acute fracture or dislocation is noted. No soft tissue abnormality is seen. IMPRESSION: No acute abnormality noted. Electronically Signed   By: Oneil Devonshire M.D.   On: 11/09/2023 23:48   CT Lumbar Spine Wo Contrast Result Date: 11/09/2023 EXAM: CT OF THE LUMBAR SPINE WITHOUT CONTRAST 11/09/2023 11:19:53 PM TECHNIQUE: CT of the lumbar spine was performed without the administration of intravenous contrast. Multiplanar reformatted images are provided for review. Automated exposure control, iterative reconstruction, and/or weight based adjustment of the mA/kV was utilized to reduce the radiation dose to as low as reasonably achievable. COMPARISON: None available. CLINICAL HISTORY: Back trauma, no prior imaging (Age >=  16y). Pt is from home, found by her son laying on garage floor. She reports she tripped when going down 2 steps and fell backwards and had been laying on the floor unable to get up for about 6 hours before her son found her. Denies head injury, no LOC, no blood thinners. She reports pain in her neck and coccyx. She arrives in a c-collar. She has bilateral leg weakness. + sensation. FINDINGS: BONES AND ALIGNMENT: Acute transverse fracture of the superior endplate of L1. Minimal vertebral body height loss. 5 mm of retropulsion of the superior endplate. The retropulsion is eccentric to the right and causes effacement of the right lateral recess. Mild spinal canal narrowing secondary to the retropulsion. The spinal canal is otherwise widely patent. Normal vertebral body heights are maintained at other visualized lumbar levels. DEGENERATIVE CHANGES: No significant degenerative changes. SOFT TISSUES: No acute abnormality. IMPRESSION: 1. Acute transverse fracture of  the L1 superior endplate with minimal height loss and 5 mm right-eccentric retropulsion, causing effacement of the right lateral recess and mild spinal canal narrowing. Electronically signed by: Norman Gatlin MD 11/09/2023 11:29 PM EDT RP Workstation: [id number]   CT Thoracic Spine Wo Contrast Result Date: 11/09/2023 EXAM: CT THORACIC SPINE WITHOUT CONTRAST 11/09/2023 11:19:53 PM TECHNIQUE: CT of the thoracic spine was performed without the administration of intravenous contrast. Multiplanar reformatted images are provided for review. Automated exposure control, iterative reconstruction, and/or weight based adjustment of the mA/kV was utilized to reduce the radiation dose to as low as reasonably achievable. COMPARISON: None available. CLINICAL HISTORY: Back trauma, no prior imaging (Age >= 16y). Pt is from home, found by her son laying on garage floor. She reports she tripped when going down 2 steps and fell backwards and had been laying on the floor unable to get up for about 6 hours before her son found her. Denies head injury, no LOC, no blood thinners. She reports pain in her neck and coccyx. She arrives in a c-collar. She has bilateral leg weakness. + sensation. FINDINGS: BONES AND ALIGNMENT: Normal vertebral body heights. No acute fracture in the thoracic spine. No suspicious bone lesion. Normal alignment. Lumbar spine reported separately. DEGENERATIVE CHANGES: Multilevel age related spondylosis. No severe spinal canal narrowing. SOFT TISSUES: No acute abnormality. LUNGS: Biapical pleural parenchymal scarring in the lungs. IMPRESSION: 1. No acute fracture of the thoracic spine. 2. Multilevel age-related spondylosis without severe spinal canal narrowing. Electronically signed by: Norman Gatlin MD 11/09/2023 11:26 PM EDT RP Workstation: [id number]   CT Head Wo Contrast Result Date: 11/09/2023 CLINICAL DATA:  Recent slip and fall with headaches, initial encounter EXAM: CT HEAD WITHOUT CONTRAST  TECHNIQUE: Contiguous axial images were obtained from the base of the skull through the vertex without intravenous contrast. RADIATION DOSE REDUCTION: This exam was performed according to the departmental dose-optimization program which includes automated exposure control, adjustment of the mA and/or kV according to patient size and/or use of iterative reconstruction technique. COMPARISON:  None Available. FINDINGS: Brain: No evidence of acute infarction, hemorrhage, hydrocephalus, extra-axial collection or mass lesion/mass effect. Cavum septum pellucidum is noted. Mild chronic white matter ischemic changes are noted. Vascular: No hyperdense vessel or unexpected calcification. Skull: Normal. Negative for fracture or focal lesion. Sinuses/Orbits: No acute finding. Other: None. IMPRESSION: Chronic ischemic changes without acute abnormality. Electronically Signed   By: Oneil Devonshire M.D.   On: 11/09/2023 23:22           LOS: 0 days   Time spent= 37 mins  [name], MD [facility]  If 7PM-7AM, please contact night-coverage  11/11/2023, 9:48 AM

## 2023-11-12 DIAGNOSIS — S32011A Stable burst fracture of first lumbar vertebra, initial encounter for closed fracture: Secondary | ICD-10-CM | POA: Diagnosis not present

## 2023-11-12 NOTE — Progress Notes (Signed)
 Occupational Therapy Treatment Patient Details Name: Olivia Evans MRN: [id number] DOB: 11/02/1926 Today's Date: 11/12/2023   History of present illness Olivia Evans is a 89 y.o. female admitted with c/o back pain s/p fall at home. MRI: Acute incomplete burst fracture of L1 with approximately 20% height loss and  5 mm retropulsion, narrowing of the right lateral recess at T12-L1 due to retropulsion from the L1 fracture, and small left asymmetric disc bulge at L2-3 and small disc bulge at L4-L5. PHMx: myelodysplastic syndrome, chronic HFpEF, RBBB, interstitial cystitis, arthritis, diverticulosis, heart murmur, R breast CA s/p mastectomy with radiation 2001, osteopenia, rotator cuff repair.   OT comments  Patient seated in recliner and agreeable to OT treatment. Patient stating discomfort with legs sticking to fabric on chair.  Medium sized paper scrub pants provided for patient with max assist to donn and patient continued to have discomfort and asked for larger size. Large scrub pants and pull up brief donned with max assist and patient stated increased comfort. Patient able to stand at sink for grooming tasks and ambulated from sink to EOB to return to sidelying.  Patient making good gains but requires increased time to perform tasks due to pain and discomfort.  Patient will benefit from continued inpatient follow up therapy, <3 hours/day. Acute OT to continue to follow to address established goals to facilitate DC to next venue of care.        If plan is discharge home, recommend the following:  A little help with walking and/or transfers;A lot of help with bathing/dressing/bathroom;Assistance with cooking/housework;Help with stairs or ramp for entrance;Assist for transportation   Equipment Recommendations  None recommended by OT    Recommendations for Other Services      Precautions / Restrictions Precautions Precautions: Fall;Back Required Braces or Orthoses: Spinal Brace Spinal  Brace: Thoracolumbosacral orthotic;Applied in sitting position Restrictions Weight Bearing Restrictions Per Provider Order: No       Mobility Bed Mobility Overal bed mobility: Needs Assistance Bed Mobility: Rolling, Sit to Sidelying Rolling: Supervision       Sit to sidelying: Min assist General bed mobility comments: assistance with BLEs    Transfers Overall transfer level: Needs assistance Equipment used: Rolling walker (2 wheels) Transfers: Sit to/from Stand Sit to Stand: Min assist     Step pivot transfers: Contact guard assist     General transfer comment: min assist to stand from recliner and BSC, CGA to steady while using RW     Balance Overall balance assessment: Needs assistance Sitting-balance support: Feet supported, Bilateral upper extremity supported Sitting balance-Leahy Scale: Fair Sitting balance - Comments: supervison to CGA for sitting on EOB   Standing balance support: Single extremity supported, Bilateral upper extremity supported, During functional activity Standing balance-Leahy Scale: Poor Standing balance comment: able to stand at sink with one extremity support with sink for support                           ADL either performed or assessed with clinical judgement   ADL Overall ADL's : Needs assistance/impaired     Grooming: Wash/dry hands;Wash/dry face;Oral care;Minimal assistance;Standing Grooming Details (indicate cue type and reason): at sink             Lower Body Dressing: Maximal assistance;Sit to/from stand Lower Body Dressing Details (indicate cue type and reason): donned scrub pants and pull up brief with max assist to thread legs into clothing and patient assisting with pulling up  while standing Toilet Transfer: Minimal assistance;Stand-pivot;Rolling walker (2 wheels);BSC/3in1   Toileting- Clothing Manipulation and Hygiene: Supervision/safety;Sitting/lateral lean         General ADL Comments: Patient stood 4  times at sink for LB dressing and grooming    Extremity/Trunk Assessment              Vision       Perception     Praxis     Communication Communication Communication: No apparent difficulties Factors Affecting Communication: Hearing impaired   Cognition Arousal: Alert Behavior During Therapy: WFL for tasks assessed/performed Cognition: No apparent impairments                               Following commands: Intact        Cueing   Cueing Techniques: Verbal cues  Exercises      Shoulder Instructions       General Comments several pairs of scrub pants were donned to address patient's comfort seated in chair and in bed    Pertinent Vitals/ Pain       Pain Assessment Pain Assessment: Faces Faces Pain Scale: Hurts even more Pain Location: back Pain Descriptors / Indicators: Aching, Discomfort, Grimacing, Sore, Guarding Pain Intervention(s): Limited activity within patient's tolerance, Monitored during session, Repositioned, RN gave pain meds during session  Home Living                                          Prior Functioning/Environment              Frequency  Min 2X/week        Progress Toward Goals  OT Goals(current goals can now be found in the care plan section)  Progress towards OT goals: Progressing toward goals  Acute Rehab OT Goals Patient Stated Goal: less pain OT Goal Formulation: With patient Time For Goal Achievement: 11/24/23 Potential to Achieve Goals: Good ADL Goals Pt Will Perform Grooming: with supervision;standing Pt Will Perform Lower Body Dressing: with set-up;with supervision;with adaptive equipment;sit to/from stand Pt Will Transfer to Toilet: with supervision;ambulating;bedside commode (over toilet) Pt Will Perform Toileting - Clothing Manipulation and hygiene: with supervision;sit to/from stand Additional ADL Goal #1: Pt will be Mod I in and OOB for basic ADLs (she normally sleeps in  recliner)  Plan      Co-evaluation                 AM-PAC OT 6 Clicks Daily Activity     Outcome Measure   Help from another person eating meals?: None Help from another person taking care of personal grooming?: A Little Help from another person toileting, which includes using toliet, bedpan, or urinal?: A Lot Help from another person bathing (including washing, rinsing, drying)?: A Lot Help from another person to put on and taking off regular upper body clothing?: A Little Help from another person to put on and taking off regular lower body clothing?: A Lot 6 Click Score: 16    End of Session Equipment Utilized During Treatment: Gait belt;Rolling walker (2 wheels);Back brace  OT Visit Diagnosis: Unsteadiness on feet (R26.81);Other abnormalities of gait and mobility (R26.89);History of falling (Z91.81);Pain Pain - part of body:  (back)   Activity Tolerance Patient tolerated treatment well   Patient Left in bed;with call bell/phone within reach;with bed alarm set;with family/visitor present  Nurse Communication Mobility status;Precautions        Time: 9045-8958 OT Time Calculation (min): 47 min  Charges: OT General Charges $OT Visit: 1 Visit OT Treatments $Self Care/Home Management : 38-52 mins  Dick Laine, OTA Acute Rehabilitation Services  Office [phone]   Jeb LITTIE Laine 11/12/2023, 12:55 PM

## 2023-11-12 NOTE — Plan of Care (Signed)

## 2023-11-12 NOTE — Progress Notes (Signed)
 Physical Therapy Treatment Patient Details Name: Olivia Evans MRN: [id number] DOB: 06-12-1926 Today's Date: 11/12/2023   History of Present Illness Olivia Evans is a 89 y.o. female admitted with c/o back pain s/p fall at home. MRI: Acute incomplete burst fracture of L1 with approximately 20% height loss and  5 mm retropulsion, narrowing of the right lateral recess at T12-L1 due to retropulsion from the L1 fracture, and small left asymmetric disc bulge at L2-3 and small disc bulge at L4-L5. PHMx: myelodysplastic syndrome, chronic HFpEF, RBBB, interstitial cystitis, arthritis, diverticulosis, heart murmur, R breast CA s/p mastectomy with radiation 2001, osteopenia, rotator cuff repair.    PT Comments  Received pt semi-reclined in bed with RN and son at bedside. With encouragement pt agreed to OOB mobility but fixated on getting stuck to bed sheets/linens. Pt performed bed mobility with CGA using bed features and donned TLSO with max A while educating pt/son on donning technique. Donned gown and placed sheet on recliner for comfort and to prevent pt from sticking. Pt politely refused ambulating but agreed to sit in recliner for breakfast. Stood from EOB with RW and min A and transferred into recliner via stand<>pivot with CGA. Pt left in recliner with all needs met. Current recommendations remain appropriate, however if pain improves pt could transition to HHPT. Acute PT to cont to follow.    If plan is discharge home, recommend the following: A little help with walking and/or transfers;Assistance with cooking/housework;Help with stairs or ramp for entrance;Assist for transportation   Can travel by private vehicle     Yes  Equipment Recommendations  None recommended by PT (TBD in next venue)    Recommendations for Other Services       Precautions / Restrictions Precautions Precautions: Fall;Back Required Braces or Orthoses: Spinal Brace Spinal Brace: Thoracolumbosacral  orthotic;Applied in sitting position Restrictions Weight Bearing Restrictions Per Provider Order: No     Mobility  Bed Mobility Overal bed mobility: Needs Assistance Bed Mobility: Rolling, Supine to Sit Rolling: Supervision   Supine to sit: Contact guard, HOB elevated, Used rails     General bed mobility comments: increased time and effort with encouragement to do as much herself as possible. Sleeps in recliner at home. Patient Response: Cooperative  Transfers Overall transfer level: Needs assistance Equipment used: Rolling walker (2 wheels) Transfers: Sit to/from Stand Sit to Stand: Min assist Stand pivot transfers: Contact guard assist         General transfer comment: min A to stand from EOB with RW and CGA for stand<>pivot. Cues to reach back for recliner armrests prior to sitting.    Ambulation/Gait               General Gait Details: pt politely refused due to pain   Stairs             Wheelchair Mobility     Tilt Bed Tilt Bed Patient Response: Cooperative  Modified Rankin (Stroke Patients Only)       Balance Overall balance assessment: Needs assistance Sitting-balance support: Feet supported, Bilateral upper extremity supported Sitting balance-Leahy Scale: Fair Sitting balance - Comments: able to maintain static sitting balance with CGA/close supervision due to pain   Standing balance support: Bilateral upper extremity supported, Reliant on assistive device for balance (RW) Standing balance-Leahy Scale: Poor Standing balance comment: required CGA/min A  Communication Communication Communication: No apparent difficulties Factors Affecting Communication: Hearing impaired  Cognition Arousal: Alert Behavior During Therapy: WFL for tasks assessed/performed   PT - Cognitive impairments: No apparent impairments                         Following commands: Intact      Cueing Cueing  Techniques: Verbal cues  Exercises      General Comments General comments (skin integrity, edema, etc.): Pt/son were educated on proper donning technqiue for TLSO. Pt reports it was choking her before but was on incorrectly.      Pertinent Vitals/Pain Pain Assessment Pain Assessment: 0-10 Pain Score: 10-Worst pain ever Pain Location: back Pain Descriptors / Indicators: Aching, Discomfort, Grimacing, Sore, Guarding Pain Intervention(s): Limited activity within patient's tolerance, Monitored during session, Premedicated before session, Repositioned    Home Living                          Prior Function            PT Goals (current goals can now be found in the care plan section) Acute Rehab PT Goals Patient Stated Goal: to get stronger, reduce pain PT Goal Formulation: With patient/family Time For Goal Achievement: 11/24/23 Potential to Achieve Goals: Good Progress towards PT goals: Progressing toward goals    Frequency    Min 2X/week      PT Plan      Co-evaluation              AM-PAC PT 6 Clicks Mobility   Outcome Measure  Help needed turning from your back to your side while in a flat bed without using bedrails?: A Lot Help needed moving from lying on your back to sitting on the side of a flat bed without using bedrails?: A Lot Help needed moving to and from a bed to a chair (including a wheelchair)?: A Little Help needed standing up from a chair using your arms (e.g., wheelchair or bedside chair)?: A Little Help needed to walk in hospital room?: A Little Help needed climbing 3-5 steps with a railing? : A Lot 6 Click Score: 15    End of Session Equipment Utilized During Treatment: Gait belt Activity Tolerance: Patient limited by pain Patient left: in chair;with call bell/phone within reach;with family/visitor present Nurse Communication: Mobility status PT Visit Diagnosis: Unsteadiness on feet (R26.81);Other abnormalities of gait and  mobility (R26.89);Muscle weakness (generalized) (M62.81);Pain Pain - Right/Left:  (back)     Time: 9157-9096 PT Time Calculation (min) (ACUTE ONLY): 21 min  Charges:    $Therapeutic Activity: 8-22 mins PT General Charges $$ ACUTE PT VISIT: 1 Visit                     Therisa Stains PT, DPT Therisa HERO Zaunegger 11/12/2023, 12:08 PM

## 2023-11-12 NOTE — TOC Progression Note (Signed)
 Transition of Care [facility]) - Progression Note    Patient Details  Name: Olivia Evans MRN: [id number] Date of Birth: 08-06-1926  [facility] of [facility]) CM/SW Contact  Sherline Clack, [place] Phone Number: 11/12/2023, 4:11 PM  Clinical Narrative:     CSW met with patient and adult children at bedside to review bed offers. CSW provided family with a list of facilities with Medicare ratings. Family expressed interest in [facility] but also requested CSW submit request for CIR evaluation. CSW explained to family that patient would need to tolerate 3 hours of therapy a day at CIR, and family said they would like for PT to evaluate the patient to see if she is a candidate. PT made aware. CSW will continue to follow and update DC plan.   Expected Discharge Plan: Skilled Nursing Facility Barriers to Discharge: Continued Medical Work up               Expected Discharge Plan and Services In-house Referral: Clinical Social Work Discharge Planning Services: CM Consult Post Acute Care Choice: Skilled Nursing Facility Living arrangements for the past 2 months: Single Family Home                                       Social Drivers of Health (SDOH) Interventions SDOH Screenings   Food Insecurity: No Food Insecurity (11/10/2023)  Housing: Low Risk  (11/10/2023)  Transportation Needs: No Transportation Needs (11/10/2023)  Utilities: Not At Risk (11/10/2023)  Alcohol Screen: Low Risk  (05/11/2023)  Depression (PHQ2-9): Low Risk  (05/11/2023)  Financial Resource Strain: Low Risk  (05/11/2023)  Physical Activity: Inactive (05/11/2023)  Social Connections: Moderately Isolated (11/10/2023)  Stress: No Stress Concern Present (05/11/2023)  Tobacco Use: Low Risk  (11/10/2023)  Health Literacy: Adequate Health Literacy (05/11/2023)    Readmission Risk Interventions     No data to display

## 2023-11-12 NOTE — Progress Notes (Signed)
 PROGRESS NOTE    Olivia Evans  FMW:[id number] DOB: 02-Sep-1926 DOA: 11/09/2023 PCP: Geofm Glade PARAS, MD   Brief Narrative:   89 y.o. female with medical history significant for myelodysplastic syndrome, chronic HFpEF, and interstitial cystitis who presents with low back pain after a fall at home.   Seen by NSGY, recommending TLSO when OOB x3 months.  Outpatient NSGY f/u in 2 weeks.   Pending placement to skilled nursing facility/rehab.  Patient is medically ready  Assessment & Plan:  Principal Problem:   Closed L1 vertebral fracture (HCC) Active Problems:   Myelodysplasia (myelodysplastic syndrome) (HCC)   Chronic heart failure with preserved ejection fraction (HFpEF) (HCC)   Acute Incomplete Burst Fracture of L1, POA: - MRI showing incomplete L1 burst fracture with 20% height loss and 5 mm retropulsion c/w L1 A3 injury - narrowing of R lateral recess at T12-L1 due to retropulsion from L1 fracture.  Small L asymmetric disc bulge at L2-3 and small disc bulge at L4-L5 - appreciate nsgy recs, recommending TLSO brace when OOB x3 months (don brace sitting and wear when out of bed) - follow up in 2 weeks (nsgy note 10/21, Dr. Joshua) - Oxycodone  added long-acting 10 mg every 12 hours on 10/22.  Continue with as needed oxycodone  for breakthrough pain. - PT and OT on board.  Recommendation is skilled nursing facility placement.   MDS  - Under observation, follows with Dr. Sherrod  - Stable  - trend H/H while admitted   Chronic HFpEF: Not in exacerbation - EF was preserved on echo from October 2023  - Appears compensated, continue torsemide  every other day   Irritable bladder syndrome, POA: Outpatient follow-up with urology. Patient does have acute issues with urinary incontinence.   Disposition: Pending skilled nursing facility placement   DVT prophylaxis: SCDs Start: 11/10/23 0428     Code Status: Limited: Do not attempt resuscitation (DNR) -DNR-LIMITED -Do Not Intubate/DNI   Family Communication: Son is present at the bedside Status is: Observation   Subjective:  Pain is better now after long acting oxycodone  was initiated yesterday. Son is present at the bedside. He would like to talk to the case manager about rehab options.   Examination:  General exam: Appears sleepy but no acute distress noted Respiratory system: Clear to auscultation. Respiratory effort normal. Cardiovascular system: S1 & S2 heard, RRR. No JVD, murmurs, rubs, gallops or clicks. No pedal edema. Gastrointestinal system: Abdomen is nondistended, soft and nontender. No organomegaly or masses felt. Normal bowel sounds heard. Central nervous system: Alert and oriented. No focal neurological deficits. Extremities: Symmetric 5 x 5 power. Skin: No rashes, lesions or ulcers Psychiatry: Judgement and insight appear normal. Mood & affect appropriate.     Diet Orders (From admission, onward)     Start     Ordered   11/10/23 0428  Diet regular Room service appropriate? Yes; Fluid consistency: Thin  Diet effective now       Question Answer Comment  Room service appropriate? Yes   Fluid consistency: Thin      11/10/23 0428            Objective: Vitals:   11/11/23 0731 11/11/23 1942 11/12/23 0028 11/12/23 0800  BP: (!) 125/54 (!) 141/56 (!) 149/58 (!) 145/60  Pulse: 79 84 83 79  Resp:  17  16  Temp: 98.1 F (36.7 C) 98.7 F (37.1 C) 98.8 F (37.1 C)   TempSrc: Oral Oral    SpO2: 96% 95% 97% 95%  Weight:  Height:       No intake or output data in the 24 hours ending 11/12/23 1001 Filed Weights   11/09/23 2221  Weight: 59 kg    Scheduled Meds:  acetaminophen   1,000 mg Oral Q8H   calcitonin (salmon)  1 spray Alternating Nares Daily   ferrous sulfate  325 mg Oral QODAY   lidocaine   2 patch Transdermal Q24H   oxyCODONE   10 mg Oral Q12H   torsemide   20 mg Oral QODAY   Continuous Infusions:  Nutritional status     Body mass index is 22.31 kg/m.  Data  Reviewed:   CBC: Recent Labs  Lab 11/09/23 2300 11/10/23 0555 11/10/23 0800 11/11/23 0430  WBC 8.8 4.8  --  4.8  HGB 8.9* 7.4* 7.9* 7.6*  HCT 27.1* 22.5* 23.6* 22.8*  MCV 112.4* 111.4*  --  111.8*  PLT 265 192  --  213   Basic Metabolic Panel: Recent Labs  Lab 11/09/23 2300 11/10/23 0555 11/11/23 0430  NA 137 136 140  K 3.9 3.9 3.9  CL 102 104 105  CO2 26 27 25   GLUCOSE 142* 111* 107*  BUN 12 13 10   CREATININE 0.62 0.64 0.63  CALCIUM 9.6 8.9 9.0   GFR: Estimated Creatinine Clearance: 36.3 mL/min (by C-G formula based on SCr of 0.63 mg/dL). Liver Function Tests: No results for input(s): AST, ALT, ALKPHOS, BILITOT, PROT, ALBUMIN in the last 168 hours. No results for input(s): LIPASE, AMYLASE in the last 168 hours. No results for input(s): AMMONIA in the last 168 hours. Coagulation Profile: No results for input(s): INR, PROTIME in the last 168 hours. Cardiac Enzymes: Recent Labs  Lab 11/09/23 2300  CKTOTAL 304*   BNP (last 3 results) No results for input(s): PROBNP in the last 8760 hours. HbA1C: No results for input(s): HGBA1C in the last 72 hours. CBG: No results for input(s): GLUCAP in the last 168 hours. Lipid Profile: No results for input(s): CHOL, HDL, LDLCALC, TRIG, CHOLHDL, LDLDIRECT in the last 72 hours. Thyroid  Function Tests: No results for input(s): TSH, T4TOTAL, FREET4, T3FREE, THYROIDAB in the last 72 hours. Anemia Panel: No results for input(s): VITAMINB12, FOLATE, FERRITIN, TIBC, IRON, RETICCTPCT in the last 72 hours. Sepsis Labs: No results for input(s): PROCALCITON, LATICACIDVEN in the last 168 hours.  No results found for this or any previous visit (from the past 240 hours).       Radiology Studies: No results found.          LOS: 0 days   Time spent= 37 mins    [name], MD [facility]  If 7PM-7AM, please contact  night-coverage  11/12/2023, 10:01 AM

## 2023-11-13 ENCOUNTER — Observation Stay (HOSPITAL_COMMUNITY)

## 2023-11-13 DIAGNOSIS — S32011A Stable burst fracture of first lumbar vertebra, initial encounter for closed fracture: Secondary | ICD-10-CM | POA: Diagnosis not present

## 2023-11-13 DIAGNOSIS — R109 Unspecified abdominal pain: Secondary | ICD-10-CM | POA: Diagnosis not present

## 2023-11-13 DIAGNOSIS — K5900 Constipation, unspecified: Secondary | ICD-10-CM | POA: Diagnosis not present

## 2023-11-13 DIAGNOSIS — I878 Other specified disorders of veins: Secondary | ICD-10-CM | POA: Diagnosis not present

## 2023-11-13 MED ORDER — POLYETHYLENE GLYCOL 3350 17 G PO PACK
17.0000 g | PACK | Freq: Every day | ORAL | Status: DC
  Administered 2023-11-13 – 2023-11-20 (×8): 17 g via ORAL
  Filled 2023-11-13 (×8): qty 1

## 2023-11-13 NOTE — Progress Notes (Addendum)
 Mobility Specialist: Progress Note   11/13/23 1500  Mobility  Activity Ambulated with assistance  Level of Assistance Contact guard assist, steadying assist  Assistive Device Front wheel walker  Distance Ambulated (ft) 150 ft  Activity Response Tolerated well  Mobility Referral Yes  Mobility visit 1 Mobility  Mobility Specialist Start Time (ACUTE ONLY) 1505  Mobility Specialist Stop Time (ACUTE ONLY) 1517  Mobility Specialist Time Calculation (min) (ACUTE ONLY) 12 min    Pt received in chair, very pleasant and agreeable to mobility session. Daughter present and helpful as well. TLSO brace already donned. CGA throughout. C/o nerve pain in the lower back/upper R glute area that improved with further ambulation. Returned to room without fault. Left in chair with all needs met, call bell in reach.   Ileana Lute Mobility Specialist Please contact via SecureChat or Rehab office at [phone]

## 2023-11-13 NOTE — Progress Notes (Signed)
 PROGRESS NOTE    Olivia Evans  FMW:[id number] DOB: November 01, 1926 DOA: 11/09/2023 PCP: Geofm Glade PARAS, MD   Brief Narrative:   89 y.o. female with medical history significant for myelodysplastic syndrome, chronic HFpEF, and interstitial cystitis who presents with low back pain after a fall at home.   Seen by NSGY, recommending TLSO when OOB x3 months.  Outpatient NSGY f/u in 2 weeks.   Pending placement to skilled nursing facility/rehab.  Patient is medically ready  Assessment & Plan:  Principal Problem:   Closed L1 vertebral fracture (HCC) Active Problems:   Myelodysplasia (myelodysplastic syndrome) (HCC)   Chronic heart failure with preserved ejection fraction (HFpEF) (HCC)   Acute Incomplete Burst Fracture of L1, POA: - MRI showing incomplete L1 burst fracture with 20% height loss and 5 mm retropulsion c/w L1 A3 injury - narrowing of R lateral recess at T12-L1 due to retropulsion from L1 fracture.  Small L asymmetric disc bulge at L2-3 and small disc bulge at L4-L5 - appreciate nsgy recs, recommending TLSO brace when OOB x3 months (don brace sitting and wear when out of bed) - follow up in 2 weeks (nsgy note 10/21, Dr. Joshua) - Oxycodone  added long-acting 10 mg every 12 hours on 10/22.  Continue with as needed oxycodone  for breakthrough pain. - PT and OT on board.  Recommendation is skilled nursing facility placement.   MDS  - Under observation, follows with Dr. Sherrod  - Stable  - trend H/H while admitted   Chronic HFpEF: Not in exacerbation - EF was preserved on echo from October 2023  - Appears compensated, continue torsemide  every other day   Irritable bladder syndrome, POA: Outpatient follow-up with urology. Patient does have acute issues with urinary incontinence.   Disposition: Pending skilled nursing facility placement vs CIR.   DVT prophylaxis: SCDs Start: 11/10/23 0428     Code Status: Limited: Do not attempt resuscitation (DNR) -DNR-LIMITED -Do Not  Intubate/DNI  Family Communication: Son and daughter present at the bedside Status is: Observation   Subjective:  No acute events overnight. She was sitting in the recliner. Complaining of constipation. Discussed with patient's son and daughter at the bedside about SNF vs CIR.   Examination:  General exam: Alert and awake, NAD Respiratory system: Clear to auscultation. Respiratory effort normal. Cardiovascular system: S1 & S2 heard, RRR. No JVD, murmurs, rubs, gallops or clicks. No pedal edema. Gastrointestinal system: Abdomen is nondistended, soft and nontender. No organomegaly or masses felt. Normal bowel sounds heard. Central nervous system: Alert and oriented. No focal neurological deficits. Extremities: Symmetric 5 x 5 power. Skin: No rashes, lesions or ulcers Psychiatry: Judgement and insight appear normal. Mood & affect appropriate.     Diet Orders (From admission, onward)     Start     Ordered   11/10/23 0428  Diet regular Evans service appropriate? Yes; Fluid consistency: Thin  Diet effective now       Question Answer Comment  Evans service appropriate? Yes   Fluid consistency: Thin      11/10/23 0428            Objective: Vitals:   11/12/23 2040 11/13/23 0455 11/13/23 0832 11/13/23 0900  BP: 96/83 110/62 (!) 149/47 (!) 146/66  Pulse: 72 72 85   Resp: 18  18   Temp: 98.4 F (36.9 C) 98 F (36.7 C) 98.3 F (36.8 C)   TempSrc:  Oral    SpO2: 95% 95% (!) 85% 93%  Weight:  Height:       No intake or output data in the 24 hours ending 11/13/23 1227 Filed Weights   11/09/23 2221  Weight: 59 kg    Scheduled Meds:  calcitonin (salmon)  1 spray Alternating Nares Daily   ferrous sulfate  325 mg Oral QODAY   lidocaine   2 patch Transdermal Q24H   oxyCODONE   10 mg Oral Q12H   torsemide   20 mg Oral QODAY   Continuous Infusions:  Nutritional status     Body mass index is 22.31 kg/m.  Data Reviewed:   CBC: Recent Labs  Lab 11/09/23 2300  11/10/23 0555 11/10/23 0800 11/11/23 0430  WBC 8.8 4.8  --  4.8  HGB 8.9* 7.4* 7.9* 7.6*  HCT 27.1* 22.5* 23.6* 22.8*  MCV 112.4* 111.4*  --  111.8*  PLT 265 192  --  213   Basic Metabolic Panel: Recent Labs  Lab 11/09/23 2300 11/10/23 0555 11/11/23 0430  NA 137 136 140  K 3.9 3.9 3.9  CL 102 104 105  CO2 26 27 25   GLUCOSE 142* 111* 107*  BUN 12 13 10   CREATININE 0.62 0.64 0.63  CALCIUM 9.6 8.9 9.0   GFR: Estimated Creatinine Clearance: 36.3 mL/min (by C-G formula based on SCr of 0.63 mg/dL). Liver Function Tests: No results for input(s): AST, ALT, ALKPHOS, BILITOT, PROT, ALBUMIN in the last 168 hours. No results for input(s): LIPASE, AMYLASE in the last 168 hours. No results for input(s): AMMONIA in the last 168 hours. Coagulation Profile: No results for input(s): INR, PROTIME in the last 168 hours. Cardiac Enzymes: Recent Labs  Lab 11/09/23 2300  CKTOTAL 304*   BNP (last 3 results) No results for input(s): PROBNP in the last 8760 hours. HbA1C: No results for input(s): HGBA1C in the last 72 hours. CBG: No results for input(s): GLUCAP in the last 168 hours. Lipid Profile: No results for input(s): CHOL, HDL, LDLCALC, TRIG, CHOLHDL, LDLDIRECT in the last 72 hours. Thyroid  Function Tests: No results for input(s): TSH, T4TOTAL, FREET4, T3FREE, THYROIDAB in the last 72 hours. Anemia Panel: No results for input(s): VITAMINB12, FOLATE, FERRITIN, TIBC, IRON, RETICCTPCT in the last 72 hours. Sepsis Labs: No results for input(s): PROCALCITON, LATICACIDVEN in the last 168 hours.  No results found for this or any previous visit (from the past 240 hours).       Radiology Studies: No results found.       LOS: 0 days   Time spent= 36 mins    Olivia Room, MD [facility]  If 7PM-7AM, please contact night-coverage  11/13/2023, 12:27 PM

## 2023-11-13 NOTE — Plan of Care (Signed)

## 2023-11-14 DIAGNOSIS — D469 Myelodysplastic syndrome, unspecified: Secondary | ICD-10-CM | POA: Diagnosis not present

## 2023-11-14 DIAGNOSIS — I5032 Chronic diastolic (congestive) heart failure: Secondary | ICD-10-CM | POA: Diagnosis not present

## 2023-11-14 DIAGNOSIS — S32011A Stable burst fracture of first lumbar vertebra, initial encounter for closed fracture: Secondary | ICD-10-CM | POA: Diagnosis not present

## 2023-11-14 DIAGNOSIS — R52 Pain, unspecified: Secondary | ICD-10-CM | POA: Diagnosis not present

## 2023-11-14 MED ORDER — BISACODYL 10 MG RE SUPP
10.0000 mg | Freq: Every day | RECTAL | Status: DC | PRN
  Administered 2023-11-18 – 2023-11-19 (×2): 10 mg via RECTAL
  Filled 2023-11-14 (×3): qty 1

## 2023-11-14 NOTE — Plan of Care (Signed)

## 2023-11-14 NOTE — Progress Notes (Signed)
 Progress Note   Patient: Olivia Evans FMW:[id number] DOB: June 25, 1926 DOA: 11/09/2023     0 DOS: the patient was seen and examined on 11/14/2023   Brief hospital course: 89 y.o. female with medical history significant for myelodysplastic syndrome, chronic HFpEF, and interstitial cystitis who presents with low back pain after a fall at home.   Seen by NSGY, recommending TLSO when OOB x3 months.  Outpatient NSGY f/u in 2 weeks.  Pending placement to skilled nursing facility/rehab.  Patient is medically ready awaiting placement.   Assessment & Plan:  Principal Problem:   Closed L1 vertebral fracture (HCC) Active Problems:   Myelodysplasia (myelodysplastic syndrome) (HCC)   Chronic heart failure with preserved ejection fraction (HFpEF) (HCC)   Acute Incomplete Burst Fracture of L1, POA: MRI showed incomplete L1 burst fracture with 20% height loss and 5 mm retropulsion c/w L1 A3 injury - narrowing of R lateral recess at T12-L1 due to retropulsion from L1 fracture.  Small L asymmetric disc bulge at L2-3 and small disc bulge at L4-L5 Neurosurgery recommending TLSO brace when OOB x3 months (don brace sitting and wear when out of bed) - follow up in 2 weeks (nsgy note 10/21, Dr. Joshua) Oxycodone  added long-acting 10 mg every 12 hours on 10/22.  Continue with as needed oxycodone  for breakthrough pain. Continue constipation regimen. PT and OT on board.  Recommendation is skilled nursing facility placement.   MDS  Under observation, follows with Dr. Sherrod Hibbs  Trend H/H while admitted   Chronic HFpEF: Not in exacerbation EF was preserved on echo from October 2023  Appears compensated, continue torsemide  every other day    Irritable bladder syndrome, POA: Outpatient follow-up with urology. Patient does have acute issues with urinary incontinence.       Out of bed to chair. Incentive spirometry. Nursing supportive care. Fall, aspiration precautions. Diet:  Diet Orders (From  admission, onward)     Start     Ordered   11/10/23 0428  Diet regular Room service appropriate? Yes; Fluid consistency: Thin  Diet effective now       Question Answer Comment  Room service appropriate? Yes   Fluid consistency: Thin      11/10/23 0428           DVT prophylaxis: SCDs Start: 11/10/23 0428  Level of care: Med-Surg   Code Status: Limited: Do not attempt resuscitation (DNR) -DNR-LIMITED -Do Not Intubate/DNI   Subjective: Patient is seen and examined today morning. She is sitting in chair with back brace. Still has pain. Daughter at bedside discussed about her observation status. Asked about discharge plan.  Physical Exam: Vitals:   11/13/23 2028 11/14/23 0411 11/14/23 0800 11/14/23 1149  BP: (!) 141/48  (!) 138/57 (!) 140/52  Pulse: 87  81 90  Resp: 18 18 17 18   Temp: 98.2 F (36.8 C)  98.2 F (36.8 C) (!) 97.1 F (36.2 C)  TempSrc: Oral  Oral   SpO2: 93%  95% 98%  Weight:      Height:        General - Elderly thin built Caucasian female, in distress due to pain HEENT - PERRLA, EOMI, atraumatic head, non tender sinuses. Lung - Clear, no rales, rhonchi, wheezes. Heart - S1, S2 heard, no murmurs, rubs, no pedal edema. Abdomen - Soft, non tender, bowel sounds good Neuro - Alert, awake and oriented, non focal exam. Skin - Warm and dry. Back brace is on.  Data Reviewed:  Latest Ref Rng & Units 11/11/2023    4:30 AM 11/10/2023    8:00 AM 11/10/2023    5:55 AM  CBC  WBC 4.0 - 10.5 K/uL 4.8   4.8   Hemoglobin 12.0 - 15.0 g/dL 7.6  7.9  7.4   Hematocrit 36.0 - 46.0 % 22.8  23.6  22.5   Platelets 150 - 400 K/uL 213   192       Latest Ref Rng & Units 11/11/2023    4:30 AM 11/10/2023    5:55 AM 11/09/2023   11:00 PM  BMP  Glucose 70 - 99 mg/dL 892  888  857   BUN 8 - 23 mg/dL 10  13  12    Creatinine 0.44 - 1.00 mg/dL 9.36  9.35  9.37   Sodium 135 - 145 mmol/L 140  136  137   Potassium 3.5 - 5.1 mmol/L 3.9  3.9  3.9   Chloride 98 - 111 mmol/L  105  104  102   CO2 22 - 32 mmol/L 25  27  26    Calcium 8.9 - 10.3 mg/dL 9.0  8.9  9.6    DG Abd 1 View Result Date: 11/13/2023 EXAM: 1 View Xray Of The Abdomen 11/13/2023 10:36:17 Pm COMPARISON: None Available. CLINICAL HISTORY: Constipation 358444. Abdominal Pain. No Bowel Movement In 2 To 3 Days FINDINGS: BOWEL: Nonobstructive Bowel Gas Pattern. Mild Colonic Stool Burden. SOFT TISSUES: Round Punctate Calcifications In The Pelvis Are Likely Phleboliths. BONES: No Acute Osseous Abnormality. VASCULATURE: Vascular Calcifications. IMPRESSION: 1. No bowel obstruction. 2. Mild colonic stool burden. Electronically signed by: Dorethia Molt MD 11/13/2023 10:42 PM EDT RP Workstation: [id number]    Family Communication: Discussed with patient, daughter at bedside. They understand and agree. All questions answered.  Disposition: Status is: Observation The patient remains OBS appropriate and will d/c before 2 midnights.  Planned Discharge Destination: Skilled nursing facility     Time spent: 41 minutes  Author: Concepcion Riser, MD 11/14/2023 6:11 PM Secure chat 7am to 7pm For on call review [email or web address]

## 2023-11-14 NOTE — Progress Notes (Signed)
 Mobility Specialist: Progress Note   11/14/23 1400  Mobility  Activity Ambulated with assistance  Level of Assistance Contact guard assist, steadying assist  Assistive Device Front wheel walker  Distance Ambulated (ft) 100 ft (x2)  Activity Response Tolerated well  Mobility Referral Yes  Mobility visit 1 Mobility  Mobility Specialist Start Time (ACUTE ONLY) 1212  Mobility Specialist Stop Time (ACUTE ONLY) 1232  Mobility Specialist Time Calculation (min) (ACUTE ONLY) 20 min    Pt received in chair, pleasant and agreeable to mobility session. Daughter present and helpful. C/o BLE soreness and fatigue. CGA initially but pt progressed to SV by EOS. Returned to room and requested to use the BR and change pants. MinA for STS from elevated commode seat. Left on EOB with all needs met, call bell in reach. Daughter present.   Ileana Lute Mobility Specialist Please contact via SecureChat or Rehab office at [phone]

## 2023-11-15 DIAGNOSIS — D469 Myelodysplastic syndrome, unspecified: Secondary | ICD-10-CM | POA: Diagnosis not present

## 2023-11-15 DIAGNOSIS — S32011A Stable burst fracture of first lumbar vertebra, initial encounter for closed fracture: Secondary | ICD-10-CM | POA: Diagnosis not present

## 2023-11-15 DIAGNOSIS — R52 Pain, unspecified: Secondary | ICD-10-CM | POA: Diagnosis not present

## 2023-11-15 DIAGNOSIS — I5032 Chronic diastolic (congestive) heart failure: Secondary | ICD-10-CM | POA: Diagnosis not present

## 2023-11-15 LAB — CBC
HCT: 21.8 % — ABNORMAL LOW (ref 36.0–46.0)
Hemoglobin: 7.2 g/dL — ABNORMAL LOW (ref 12.0–15.0)
MCH: 36.5 pg — ABNORMAL HIGH (ref 26.0–34.0)
MCHC: 33 g/dL (ref 30.0–36.0)
MCV: 110.7 fL — ABNORMAL HIGH (ref 80.0–100.0)
Platelets: 233 K/uL (ref 150–400)
RBC: 1.97 MIL/uL — ABNORMAL LOW (ref 3.87–5.11)
RDW: 15 % (ref 11.5–15.5)
WBC: 3.2 K/uL — ABNORMAL LOW (ref 4.0–10.5)
nRBC: 0 % (ref 0.0–0.2)

## 2023-11-15 NOTE — Progress Notes (Signed)
 Progress Note   Patient: Olivia Evans FMW:[id number] DOB: 02/06/1926 DOA: 11/09/2023     0 DOS: the patient was seen and examined on 11/15/2023   Brief hospital course: 89 y.o. female with medical history significant for myelodysplastic syndrome, chronic HFpEF, and interstitial cystitis who presents with low back pain after a fall at home.   Seen by NSGY, recommending TLSO when OOB x3 months.  Outpatient NSGY f/u in 2 weeks.  Pending placement to skilled nursing facility/rehab.  Patient is medically ready awaiting placement.   Assessment & Plan:  Principal Problem:   Closed L1 vertebral fracture (HCC) Active Problems:   Myelodysplasia (myelodysplastic syndrome) (HCC)   Chronic heart failure with preserved ejection fraction (HFpEF) (HCC)   Acute Incomplete Burst Fracture of L1, POA: MRI showed incomplete L1 burst fracture with 20% height loss and 5 mm retropulsion c/w L1 A3 injury - narrowing of R lateral recess at T12-L1 due to retropulsion from L1 fracture.  Small L asymmetric disc bulge at L2-3 and small disc bulge at L4-L5 Neurosurgery recommending TLSO brace when OOB x3 months (don brace sitting and wear when out of bed) - follow up in 2 weeks (nsgy note 10/21, Dr. Joshua) Oxycodone  added long-acting 10 mg every 12 hours on 10/22.  Continue with as needed oxycodone  for breakthrough pain. Continue constipation regimen. She had good bm 9 days ago. PT and OT on board.  Recommendation is skilled nursing facility placement.   MDS  Under observation, follows with Dr. Sherrod Hibbs. Trend H/H while admitted   Chronic HFpEF: Not in exacerbation EF was preserved on echo from October 2023  Appears compensated, continue torsemide  every other day    Irritable bladder syndrome, POA: Outpatient follow-up with urology. Patient does have acute issues with urinary incontinence.       Out of bed to chair. Incentive spirometry. Nursing supportive care. Fall, aspiration  precautions. Diet:  Diet Orders (From admission, onward)     Start     Ordered   11/10/23 0428  Diet regular Room service appropriate? Yes; Fluid consistency: Thin  Diet effective now       Question Answer Comment  Room service appropriate? Yes   Fluid consistency: Thin      11/10/23 0428           DVT prophylaxis: SCDs Start: 11/10/23 0428  Level of care: Med-Surg   Code Status: Limited: Do not attempt resuscitation (DNR) -DNR-LIMITED -Do Not Intubate/DNI   Subjective: Patient is seen and examined today morning. She is sitting in chair. Eating fair. Pain better. Son at bedside. She has constipation. RN at bedside giving meds.  Physical Exam: Vitals:   11/14/23 2039 11/15/23 0041 11/15/23 0453 11/15/23 0918  BP: (!) 150/52 (!) 140/55 (!) 130/49 (!) 127/42  Pulse: 76 86 80 88  Resp: 17 18 18    Temp: 98 F (36.7 C) 98 F (36.7 C) 98.2 F (36.8 C) 97.9 F (36.6 C)  TempSrc: Oral Oral Oral Oral  SpO2: 97% 91% 93% 95%  Weight:      Height:        General - Elderly thin built Caucasian female, no distress HEENT - PERRLA, EOMI, atraumatic head, non tender sinuses. Lung - Clear, no rales, rhonchi, wheezes. Heart - S1, S2 heard, no murmurs, rubs, no pedal edema. Abdomen - Soft, non tender, distended, bowel sounds good Neuro - Alert, awake and oriented, non focal exam. Skin - Warm and dry. Back brace is on.  Data Reviewed:  Latest Ref Rng & Units 11/15/2023    5:01 AM 11/11/2023    4:30 AM 11/10/2023    8:00 AM  CBC  WBC 4.0 - 10.5 K/uL 3.2  4.8    Hemoglobin 12.0 - 15.0 g/dL 7.2  7.6  7.9   Hematocrit 36.0 - 46.0 % 21.8  22.8  23.6   Platelets 150 - 400 K/uL 233  213        Latest Ref Rng & Units 11/11/2023    4:30 AM 11/10/2023    5:55 AM 11/09/2023   11:00 PM  BMP  Glucose 70 - 99 mg/dL 892  888  857   BUN 8 - 23 mg/dL 10  13  12    Creatinine 0.44 - 1.00 mg/dL 9.36  9.35  9.37   Sodium 135 - 145 mmol/L 140  136  137   Potassium 3.5 - 5.1 mmol/L  3.9  3.9  3.9   Chloride 98 - 111 mmol/L 105  104  102   CO2 22 - 32 mmol/L 25  27  26    Calcium 8.9 - 10.3 mg/dL 9.0  8.9  9.6    DG Abd 1 View Result Date: 11/13/2023 EXAM: 1 View Xray Of The Abdomen 11/13/2023 10:36:17 Pm COMPARISON: None Available. CLINICAL HISTORY: Constipation 358444. Abdominal Pain. No Bowel Movement In 2 To 3 Days FINDINGS: BOWEL: Nonobstructive Bowel Gas Pattern. Mild Colonic Stool Burden. SOFT TISSUES: Round Punctate Calcifications In The Pelvis Are Likely Phleboliths. BONES: No Acute Osseous Abnormality. VASCULATURE: Vascular Calcifications. IMPRESSION: 1. No bowel obstruction. 2. Mild colonic stool burden. Electronically signed by: Dorethia Molt MD 11/13/2023 10:42 PM EDT RP Workstation: [id number]    Family Communication: Discussed with patient, son at bedside. They understand and agree. All questions answered.  Disposition: Status is: Observation The patient remains OBS appropriate and will d/c before 2 midnights.  Planned Discharge Destination: Skilled nursing facility     Time spent: 40 minutes  Author: Concepcion Riser, MD 11/15/2023 3:07 PM Secure chat 7am to 7pm For on call review [email or web address]

## 2023-11-15 NOTE — Plan of Care (Signed)

## 2023-11-16 DIAGNOSIS — R2689 Other abnormalities of gait and mobility: Secondary | ICD-10-CM | POA: Diagnosis not present

## 2023-11-16 DIAGNOSIS — R339 Retention of urine, unspecified: Secondary | ICD-10-CM | POA: Diagnosis present

## 2023-11-16 DIAGNOSIS — Z860101 Personal history of adenomatous and serrated colon polyps: Secondary | ICD-10-CM | POA: Diagnosis not present

## 2023-11-16 DIAGNOSIS — Z823 Family history of stroke: Secondary | ICD-10-CM | POA: Diagnosis not present

## 2023-11-16 DIAGNOSIS — M170 Bilateral primary osteoarthritis of knee: Secondary | ICD-10-CM | POA: Diagnosis not present

## 2023-11-16 DIAGNOSIS — R2681 Unsteadiness on feet: Secondary | ICD-10-CM | POA: Diagnosis not present

## 2023-11-16 DIAGNOSIS — W19XXXA Unspecified fall, initial encounter: Secondary | ICD-10-CM | POA: Diagnosis present

## 2023-11-16 DIAGNOSIS — Z888 Allergy status to other drugs, medicaments and biological substances status: Secondary | ICD-10-CM | POA: Diagnosis not present

## 2023-11-16 DIAGNOSIS — S32019D Unspecified fracture of first lumbar vertebra, subsequent encounter for fracture with routine healing: Secondary | ICD-10-CM | POA: Diagnosis not present

## 2023-11-16 DIAGNOSIS — R52 Pain, unspecified: Secondary | ICD-10-CM

## 2023-11-16 DIAGNOSIS — Z853 Personal history of malignant neoplasm of breast: Secondary | ICD-10-CM | POA: Diagnosis not present

## 2023-11-16 DIAGNOSIS — M8580 Other specified disorders of bone density and structure, unspecified site: Secondary | ICD-10-CM | POA: Diagnosis not present

## 2023-11-16 DIAGNOSIS — Z833 Family history of diabetes mellitus: Secondary | ICD-10-CM | POA: Diagnosis not present

## 2023-11-16 DIAGNOSIS — Z79899 Other long term (current) drug therapy: Secondary | ICD-10-CM | POA: Diagnosis not present

## 2023-11-16 DIAGNOSIS — Z885 Allergy status to narcotic agent status: Secondary | ICD-10-CM | POA: Diagnosis not present

## 2023-11-16 DIAGNOSIS — I878 Other specified disorders of veins: Secondary | ICD-10-CM | POA: Diagnosis not present

## 2023-11-16 DIAGNOSIS — Z90710 Acquired absence of both cervix and uterus: Secondary | ICD-10-CM | POA: Diagnosis not present

## 2023-11-16 DIAGNOSIS — E785 Hyperlipidemia, unspecified: Secondary | ICD-10-CM | POA: Diagnosis not present

## 2023-11-16 DIAGNOSIS — D649 Anemia, unspecified: Secondary | ICD-10-CM | POA: Diagnosis not present

## 2023-11-16 DIAGNOSIS — Z9841 Cataract extraction status, right eye: Secondary | ICD-10-CM | POA: Diagnosis not present

## 2023-11-16 DIAGNOSIS — N3010 Interstitial cystitis (chronic) without hematuria: Secondary | ICD-10-CM | POA: Diagnosis not present

## 2023-11-16 DIAGNOSIS — Z803 Family history of malignant neoplasm of breast: Secondary | ICD-10-CM | POA: Diagnosis not present

## 2023-11-16 DIAGNOSIS — Z961 Presence of intraocular lens: Secondary | ICD-10-CM | POA: Diagnosis not present

## 2023-11-16 DIAGNOSIS — M6281 Muscle weakness (generalized): Secondary | ICD-10-CM | POA: Diagnosis not present

## 2023-11-16 DIAGNOSIS — Z91048 Other nonmedicinal substance allergy status: Secondary | ICD-10-CM | POA: Diagnosis not present

## 2023-11-16 DIAGNOSIS — K5900 Constipation, unspecified: Secondary | ICD-10-CM | POA: Diagnosis not present

## 2023-11-16 DIAGNOSIS — R5381 Other malaise: Secondary | ICD-10-CM

## 2023-11-16 DIAGNOSIS — Z9842 Cataract extraction status, left eye: Secondary | ICD-10-CM | POA: Diagnosis not present

## 2023-11-16 DIAGNOSIS — N3289 Other specified disorders of bladder: Secondary | ICD-10-CM | POA: Diagnosis not present

## 2023-11-16 DIAGNOSIS — D469 Myelodysplastic syndrome, unspecified: Secondary | ICD-10-CM | POA: Diagnosis not present

## 2023-11-16 DIAGNOSIS — E441 Mild protein-calorie malnutrition: Secondary | ICD-10-CM | POA: Diagnosis not present

## 2023-11-16 DIAGNOSIS — Z8249 Family history of ischemic heart disease and other diseases of the circulatory system: Secondary | ICD-10-CM | POA: Diagnosis not present

## 2023-11-16 DIAGNOSIS — I5032 Chronic diastolic (congestive) heart failure: Secondary | ICD-10-CM | POA: Diagnosis not present

## 2023-11-16 DIAGNOSIS — S32012D Unstable burst fracture of first lumbar vertebra, subsequent encounter for fracture with routine healing: Secondary | ICD-10-CM | POA: Diagnosis not present

## 2023-11-16 DIAGNOSIS — Y92009 Unspecified place in unspecified non-institutional (private) residence as the place of occurrence of the external cause: Secondary | ICD-10-CM | POA: Diagnosis not present

## 2023-11-16 DIAGNOSIS — S32011A Stable burst fracture of first lumbar vertebra, initial encounter for closed fracture: Secondary | ICD-10-CM | POA: Diagnosis present

## 2023-11-16 DIAGNOSIS — Z66 Do not resuscitate: Secondary | ICD-10-CM | POA: Diagnosis not present

## 2023-11-16 DIAGNOSIS — M810 Age-related osteoporosis without current pathological fracture: Secondary | ICD-10-CM | POA: Diagnosis not present

## 2023-11-16 LAB — CBC
HCT: 22.8 % — ABNORMAL LOW (ref 36.0–46.0)
Hemoglobin: 7.4 g/dL — ABNORMAL LOW (ref 12.0–15.0)
MCH: 35.9 pg — ABNORMAL HIGH (ref 26.0–34.0)
MCHC: 32.5 g/dL (ref 30.0–36.0)
MCV: 110.7 fL — ABNORMAL HIGH (ref 80.0–100.0)
Platelets: 285 K/uL (ref 150–400)
RBC: 2.06 MIL/uL — ABNORMAL LOW (ref 3.87–5.11)
RDW: 14.9 % (ref 11.5–15.5)
WBC: 4.4 K/uL (ref 4.0–10.5)
nRBC: 0 % (ref 0.0–0.2)

## 2023-11-16 MED ORDER — FLEET ENEMA RE ENEM
1.0000 | ENEMA | Freq: Once | RECTAL | Status: AC
  Administered 2023-11-16: 1 via RECTAL
  Filled 2023-11-16: qty 1

## 2023-11-16 NOTE — TOC Progression Note (Addendum)
 Transition of Care [facility]) - Progression Note    Patient Details  Name: Olivia Evans MRN: [id number] Date of Birth: [birth year]  [facility] CM/SW Contact  Sherline Clack, [place] Phone Number: 11/16/2023, 1:47 PM  Clinical Narrative:     Update 3:49 PM: CSW received a phone call from Tammy with [employer]. Patient's SNF [facility] was approved for seven days 10/27-11/3, auth ID: [id number]. Ambulance authorization is currently under medical review. CSW reached out to [facility] regarding bed availability and will continue to update DC plan.   CSW met with family and patient at bedside to discuss patient's SNF bed offers.Family was provided with a list of facilities and their Medicare ratings. Family has indicated [facility] as their choice and requested CSW start insurance authorization. CSW called [employer] and started insurance auth. CSW will continue to follow and update dc plan.   Expected Discharge Plan: Skilled Nursing Facility Barriers to Discharge: Continued Medical Work up               Expected Discharge Plan and Services In-house Referral: Clinical Social Work Discharge Planning Services: CM Consult Post Acute Care Choice: Skilled Nursing Facility Living arrangements for the past 2 months: Single Family Home                                       Social Drivers of Health (SDOH) Interventions SDOH Screenings   Food Insecurity: No Food Insecurity (11/10/2023)  Housing: Low Risk  (11/10/2023)  Transportation Needs: No Transportation Needs (11/10/2023)  Utilities: Not At Risk (11/10/2023)  Alcohol Screen: Low Risk  (05/11/2023)  Depression (PHQ2-9): Low Risk  (05/11/2023)  Financial Resource Strain: Low Risk  (05/11/2023)  Physical Activity: Inactive (05/11/2023)  Social Connections: Moderately Isolated (11/10/2023)  Stress: No Stress Concern Present (05/11/2023)  Tobacco Use: Low Risk  (11/10/2023)  Health Literacy: Adequate Health Literacy  (05/11/2023)    Readmission Risk Interventions     No data to display

## 2023-11-16 NOTE — Plan of Care (Signed)
  Problem: Education: Goal: Knowledge of General Education information will improve Description: Including pain rating scale, medication(s)/side effects and non-pharmacologic comfort measures Outcome: Progressing   Problem: Health Behavior/Discharge Planning: Goal: Ability to manage health-related needs will improve Outcome: Progressing   Problem: Health Behavior/Discharge Planning: Goal: Ability to manage health-related needs will improve Outcome: Progressing   Problem: Clinical Measurements: Goal: Ability to maintain clinical measurements within normal limits will improve Outcome: Progressing Goal: Will remain free from infection Outcome: Progressing Goal: Diagnostic test results will improve Outcome: Progressing Goal: Respiratory complications will improve Outcome: Progressing Goal: Cardiovascular complication will be avoided Outcome: Progressing   Problem: Coping: Goal: Level of anxiety will decrease Outcome: Progressing   Problem: Elimination: Goal: Will not experience complications related to bowel motility Outcome: Progressing Goal: Will not experience complications related to urinary retention Outcome: Progressing   Problem: Pain Managment: Goal: General experience of comfort will improve and/or be controlled Outcome: Progressing   Problem: Safety: Goal: Ability to remain free from injury will improve Outcome: Progressing

## 2023-11-16 NOTE — Progress Notes (Signed)
 Progress Note   Patient: Olivia Evans FMW:[id number] DOB: [birth year] DOA: 11/09/2023     0 DOS: the patient was seen and examined on 11/16/2023   Brief hospital course: 89 y.o. female with medical history significant for myelodysplastic syndrome, chronic HFpEF, and interstitial cystitis who presents with low back pain after a fall at home.   Seen by NSGY, recommending TLSO when OOB x3 months.  Outpatient NSGY f/u in 2 weeks.  Pending placement to skilled nursing facility/rehab.  Patient is medically ready awaiting placement.   Assessment & Plan:  Principal Problem:   Closed L1 vertebral fracture (HCC) Active Problems:   Myelodysplasia (myelodysplastic syndrome) (HCC)   Chronic heart failure with preserved ejection fraction (HFpEF) (HCC)   Acute Incomplete Burst Fracture of L1, POA: MRI showed incomplete L1 burst fracture with 20% height loss and 5 mm retropulsion c/w L1 A3 injury - narrowing of R lateral recess at T12-L1 due to retropulsion from L1 fracture.  Small L asymmetric disc bulge at L2-3 and small disc bulge at L4-L5 Neurosurgery recommending TLSO brace when OOB x3 months (don brace sitting and wear when out of bed) - follow up in 2 weeks (nsgy note 10/21, Dr. Joshua) Oxycontin  10 mg every 12 hours.  Continue with as needed oxycodone  for breakthrough pain. Continue constipation regimen. PT and OT on board.  TOC working skilled nursing facility placement.   MDS  Under observation, follows with Dr. Sherrod Hibbs. Trend H/H while admitted   Chronic HFpEF: Not in exacerbation EF was preserved on echo from October 2023  Appears compensated, continue torsemide  every other day    Irritable bladder syndrome, POA: Outpatient follow-up with urology. Patient does have acute issues with urinary incontinence.  Constipation- In the setting of opiate use, immobility. Continue constipation regimen. Enema as needed ordered.       Out of bed to chair. Incentive  spirometry. Nursing supportive care. Fall, aspiration precautions. Diet:  Diet Orders (From admission, onward)     Start     Ordered   11/10/23 0428  Diet regular Room service appropriate? Yes; Fluid consistency: Thin  Diet effective now       Question Answer Comment  Room service appropriate? Yes   Fluid consistency: Thin      11/10/23 0428           DVT prophylaxis: SCDs Start: 11/10/23 0428  Level of care: Med-Surg   Code Status: Limited: Do not attempt resuscitation (DNR) -DNR-LIMITED -Do Not Intubate/DNI   Subjective: Patient is seen and examined today morning. She is lying in bed. Eating less, has constipation, lower abdominal pain. Loose stool yesterday, one time. Daughter at bedside.  Physical Exam: Vitals:   11/16/23 0619 11/16/23 0827 11/16/23 1219 11/16/23 1624  BP: 136/60 (!) 137/52 (!) 135/54 (!) 124/58  Pulse: 81 78 85 78  Resp:  16 16 16   Temp: 98 F (36.7 C) 98.2 F (36.8 C) 98.1 F (36.7 C) 98.7 F (37.1 C)  TempSrc:  Oral Oral Oral  SpO2: 99% 95% 93% 97%  Weight:      Height:        General - Elderly thin built Caucasian female, distress due to abdominal discomfort. HEENT - PERRLA, EOMI, atraumatic head, non tender sinuses. Lung - Clear, no rales, rhonchi, wheezes. Heart - S1, S2 heard, no murmurs, rubs, no pedal edema. Abdomen - Soft, non tender, distended, bowel sounds good Neuro - Alert, awake and oriented, non focal exam. Skin - Warm and dry. Back  brace is on.  Data Reviewed:      Latest Ref Rng & Units 11/16/2023    4:37 AM 11/15/2023    5:01 AM 11/11/2023    4:30 AM  CBC  WBC 4.0 - 10.5 K/uL 4.4  3.2  4.8   Hemoglobin 12.0 - 15.0 g/dL 7.4  7.2  7.6   Hematocrit 36.0 - 46.0 % 22.8  21.8  22.8   Platelets 150 - 400 K/uL 285  233  213       Latest Ref Rng & Units 11/11/2023    4:30 AM 11/10/2023    5:55 AM 11/09/2023   11:00 PM  BMP  Glucose 70 - 99 mg/dL 892  888  857   BUN 8 - 23 mg/dL 10  13  12    Creatinine 0.44 - 1.00  mg/dL 9.36  9.35  9.37   Sodium 135 - 145 mmol/L 140  136  137   Potassium 3.5 - 5.1 mmol/L 3.9  3.9  3.9   Chloride 98 - 111 mmol/L 105  104  102   CO2 22 - 32 mmol/L 25  27  26    Calcium 8.9 - 10.3 mg/dL 9.0  8.9  9.6    No results found.   Family Communication: Discussed with patient, daughter at bedside. They understand and agree. All questions answered.  Disposition: Status is: Observation The patient remains OBS appropriate and will d/c before 2 midnights.  Planned Discharge Destination: Skilled nursing facility     Time spent: 43 minutes  Author: Concepcion Riser, MD 11/16/2023 6:36 PM Secure chat 7am to 7pm For on call review [email or web address]

## 2023-11-16 NOTE — Plan of Care (Signed)
 22:25  S/p Fleet enema, pt had medium size BM, partially soft stool, partially small round stool. Passing significant amount of gas. Pt states she feels some relief in abdominal discomfort already.   Problem: Education: Goal: Knowledge of General Education information will improve Description: Including pain rating scale, medication(s)/side effects and non-pharmacologic comfort measures Outcome: Progressing   Problem: Health Behavior/Discharge Planning: Goal: Ability to manage health-related needs will improve Outcome: Progressing   Problem: Clinical Measurements: Goal: Ability to maintain clinical measurements within normal limits will improve Outcome: Progressing Goal: Will remain free from infection Outcome: Progressing Goal: Diagnostic test results will improve Outcome: Progressing Goal: Respiratory complications will improve Outcome: Progressing Goal: Cardiovascular complication will be avoided Outcome: Progressing   Problem: Activity: Goal: Risk for activity intolerance will decrease Outcome: Progressing   Problem: Nutrition: Goal: Adequate nutrition will be maintained Outcome: Progressing   Problem: Coping: Goal: Level of anxiety will decrease Outcome: Progressing   Problem: Elimination: Goal: Will not experience complications related to bowel motility Outcome: Progressing Goal: Will not experience complications related to urinary retention Outcome: Progressing   Problem: Pain Managment: Goal: General experience of comfort will improve and/or be controlled Outcome: Progressing   Problem: Safety: Goal: Ability to remain free from injury will improve Outcome: Progressing   Problem: Skin Integrity: Goal: Risk for impaired skin integrity will decrease Outcome: Progressing

## 2023-11-16 NOTE — Progress Notes (Signed)
 Physical Therapy Treatment Patient Details Name: Olivia Evans MRN: [id number] DOB: Jan 10, 1927 Today's Date: 11/16/2023   History of Present Illness Olivia Evans is a 89 y.o. female admitted with c/o back pain s/p fall at home. MRI: Acute incomplete burst fracture of L1 with approximately 20% height loss and  5 mm retropulsion, narrowing of the right lateral recess at T12-L1 due to retropulsion from the L1 fracture, and small left asymmetric disc bulge at L2-3 and small disc bulge at L4-L5. PHMx: myelodysplastic syndrome, chronic HFpEF, RBBB, interstitial cystitis, arthritis, diverticulosis, heart murmur, R breast CA s/p mastectomy with radiation 2001, osteopenia, rotator cuff repair.    PT Comments  Pt is slowly progressing towards goals. Poor support and significant discomfort when wearing TLSO; neurosurgery recommends TLSO to be donned in sitting and worn when out of bed. Pt currently Min A to CGA for sit to stand with RW and Min A to CGA for short distance gait with RW. Pt is denying pain today with activity. Due to pt current functional status, home set up and available assistance at home recommending skilled physical therapy services < 3 hours/day in order to address strength, balance and functional mobility to decrease risk for falls, injury, immobility, skin break down and re-hospitalization.      If plan is discharge home, recommend the following: A little help with walking and/or transfers;Assistance with cooking/housework;Help with stairs or ramp for entrance;Assist for transportation   Can travel by private vehicle     Yes  Equipment Recommendations  Rolling walker (2 wheels);BSC/3in1       Precautions / Restrictions Precautions Precautions: Fall;Back Precaution Booklet Issued: No Recall of Precautions/Restrictions: Impaired Required Braces or Orthoses: Spinal Brace Spinal Brace: Other (comment) Spinal Brace Comments: pt is not tolerating TLSO brace well. Pt not wearing  brace when entered room. Last session multiple attempts at adjusting for comfort and support but very difficult due to pt kyphotic posture. Restrictions Weight Bearing Restrictions Per Provider Order: No     Mobility  Bed Mobility   General bed mobility comments: up in chair at beginning and end of session    Transfers Overall transfer level: Needs assistance Equipment used: Rolling walker (2 wheels) Transfers: Sit to/from Stand Sit to Stand: Min assist, Contact guard assist           General transfer comment: min assist to stand from recliner, CGA to steady while using RW    Ambulation/Gait Ambulation/Gait assistance: Contact guard assist, Min assist Gait Distance (Feet): 60 Feet Assistive device: Rolling walker (2 wheels) Gait Pattern/deviations: Step-through pattern, Decreased step length - left, Decreased step length - right Gait velocity: decreased Gait velocity interpretation: <1.31 ft/sec, indicative of household ambulator   General Gait Details: pt reports no increase in pain with activity     Balance Overall balance assessment: Needs assistance Sitting-balance support: Feet supported, Bilateral upper extremity supported Sitting balance-Leahy Scale: Fair Sitting balance - Comments: supervison sitting in recliner   Standing balance support: Single extremity supported, Bilateral upper extremity supported, During functional activity Standing balance-Leahy Scale: Poor      Communication Communication Communication: No apparent difficulties Factors Affecting Communication: Hearing impaired  Cognition Arousal: Alert Behavior During Therapy: WFL for tasks assessed/performed   PT - Cognitive impairments: No apparent impairments       Following commands: Intact      Cueing Cueing Techniques: Verbal cues     General Comments General comments (skin integrity, edema, etc.): pt family present during session  Pertinent Vitals/Pain Pain Assessment Pain  Assessment: No/denies pain Pain Intervention(s): Monitored during session     PT Goals (current goals can now be found in the care plan section) Acute Rehab PT Goals Patient Stated Goal: to get stronger, reduce pain PT Goal Formulation: With patient/family Time For Goal Achievement: 11/24/23 Potential to Achieve Goals: Good Progress towards PT goals: Progressing toward goals    Frequency    Min 2X/week      PT Plan  Continue with current POC     AM-PAC PT 6 Clicks Mobility   Outcome Measure  Help needed turning from your back to your side while in a flat bed without using bedrails?: A Lot Help needed moving from lying on your back to sitting on the side of a flat bed without using bedrails?: A Lot Help needed moving to and from a bed to a chair (including a wheelchair)?: A Little Help needed standing up from a chair using your arms (e.g., wheelchair or bedside chair)?: A Little Help needed to walk in hospital room?: A Little Help needed climbing 3-5 steps with a railing? : A Lot 6 Click Score: 15    End of Session Equipment Utilized During Treatment: Gait belt Activity Tolerance: Patient tolerated treatment well Patient left: in chair;with call bell/phone within reach;with family/visitor present Nurse Communication: Mobility status PT Visit Diagnosis: Unsteadiness on feet (R26.81);Other abnormalities of gait and mobility (R26.89);Muscle weakness (generalized) (M62.81);Pain     Time: 8665-8641 PT Time Calculation (min) (ACUTE ONLY): 24 min  Charges:    $Therapeutic Activity: 23-37 mins PT General Charges $$ ACUTE PT VISIT: 1 Visit                     Olivia Evans, DPT, CLT  Acute Rehabilitation Services Office: [phone] (Secure chat preferred)    Olivia VEAR Evans 11/16/2023, 4:23 PM

## 2023-11-16 NOTE — Plan of Care (Signed)

## 2023-11-17 DIAGNOSIS — D469 Myelodysplastic syndrome, unspecified: Secondary | ICD-10-CM | POA: Diagnosis not present

## 2023-11-17 DIAGNOSIS — I5032 Chronic diastolic (congestive) heart failure: Secondary | ICD-10-CM | POA: Diagnosis not present

## 2023-11-17 DIAGNOSIS — R52 Pain, unspecified: Secondary | ICD-10-CM | POA: Diagnosis not present

## 2023-11-17 DIAGNOSIS — S32011A Stable burst fracture of first lumbar vertebra, initial encounter for closed fracture: Secondary | ICD-10-CM | POA: Diagnosis not present

## 2023-11-17 LAB — CBC
HCT: 22.8 % — ABNORMAL LOW (ref 36.0–46.0)
Hemoglobin: 7.5 g/dL — ABNORMAL LOW (ref 12.0–15.0)
MCH: 36.4 pg — ABNORMAL HIGH (ref 26.0–34.0)
MCHC: 32.9 g/dL (ref 30.0–36.0)
MCV: 110.7 fL — ABNORMAL HIGH (ref 80.0–100.0)
Platelets: 276 K/uL (ref 150–400)
RBC: 2.06 MIL/uL — ABNORMAL LOW (ref 3.87–5.11)
RDW: 14.9 % (ref 11.5–15.5)
WBC: 3.4 K/uL — ABNORMAL LOW (ref 4.0–10.5)
nRBC: 0 % (ref 0.0–0.2)

## 2023-11-17 NOTE — TOC Progression Note (Addendum)
 Transition of Care [facility]) - Progression Note    Patient Details  Name: Olivia Evans MRN: [id number] Date of Birth: May 25, 1926  Transition of Care [facility]) CM/SW Contact  Sherline Clack, [place] Phone Number: 11/17/2023, 11:09 AM  Clinical Narrative:     Update 12:58 PM: appeal submitted. Latest PT/OT note, H&P, most recent progress note, and PT/OT evaluations submitted to [phone] for fast appeal.   CSW received a phone call from Tammy at [facility] that ambulance transport authorization was denied. CSW asked if family would be able to transport patient via private vehicle and family member said no. Due to patient's pain levels, patient's son said riding in a wheelchair would be uncomfortable for the patient. Provider made aware.  Expected Discharge Plan: Skilled Nursing Facility Barriers to Discharge: Continued Medical Work up               Expected Discharge Plan and Services In-house Referral: Clinical Social Work Discharge Planning Services: CM Consult Post Acute Care Choice: Skilled Nursing Facility Living arrangements for the past 2 months: Single Family Home                                       Social Drivers of Health (SDOH) Interventions SDOH Screenings   Food Insecurity: No Food Insecurity (11/10/2023)  Housing: Low Risk  (11/10/2023)  Transportation Needs: No Transportation Needs (11/10/2023)  Utilities: Not At Risk (11/10/2023)  Alcohol Screen: Low Risk  (05/11/2023)  Depression (PHQ2-9): Low Risk  (05/11/2023)  Financial Resource Strain: Low Risk  (05/11/2023)  Physical Activity: Inactive (05/11/2023)  Social Connections: Moderately Isolated (11/10/2023)  Stress: No Stress Concern Present (05/11/2023)  Tobacco Use: Low Risk  (11/10/2023)  Health Literacy: Adequate Health Literacy (05/11/2023)    Readmission Risk Interventions     No data to display

## 2023-11-17 NOTE — Progress Notes (Signed)
 Progress Note   Patient: Olivia Evans FMW:[id number] DOB: February 17, 1926 DOA: 11/09/2023     1 DOS: the patient was seen and examined on 11/17/2023   Brief hospital course: 89 y.o. female with medical history significant for myelodysplastic syndrome, chronic HFpEF, and interstitial cystitis who presents with low back pain after a fall at home.   Seen by NSGY, recommending TLSO when OOB x3 months.  Outpatient NSGY f/u in 2 weeks.  Pending placement to skilled nursing facility/rehab.  Patient is medically ready awaiting placement.   Assessment & Plan:  Principal Problem:   Closed L1 vertebral fracture (HCC) Active Problems:   Myelodysplasia (myelodysplastic syndrome) (HCC)   Chronic heart failure with preserved ejection fraction (HFpEF) (HCC)   Acute Incomplete Burst Fracture of L1, POA: MRI showed incomplete L1 burst fracture with 20% height loss and 5 mm retropulsion c/w L1 A3 injury - narrowing of R lateral recess at T12-L1 due to retropulsion from L1 fracture.  Small L asymmetric disc bulge at L2-3 and small disc bulge at L4-L5 Neurosurgery recommending TLSO brace when OOB x3 months (don brace sitting and wear when out of bed) - follow up in 2 weeks (nsgy note 10/21, Dr. Joshua) Oxycontin  10 mg every 12 hours.  Continue with as needed oxycodone  for breakthrough pain. Continue constipation regimen. PT and OT on board.  TOC stated that she has skilled nursing facility bed available, but transportation not covered. Family donot think she can go in private vehicle, appealed for transportation.   MDS  Follows with Dr. Sherrod  Trend H/H while admitted Hb 7.5 stable.    Chronic HFpEF: Not in exacerbation EF was preserved on echo from October 2023  Appears compensated, continue torsemide  every other day    Irritable bladder syndrome, POA: Outpatient follow-up with urology.  Constipation- In the setting of opiate use, immobility. Continue constipation regimen. Enema as needed ordered.        Out of bed to chair. Incentive spirometry. Nursing supportive care. Fall, aspiration precautions. Diet:  Diet Orders (From admission, onward)     Start     Ordered   11/10/23 0428  Diet regular Room service appropriate? Yes; Fluid consistency: Thin  Diet effective now       Question Answer Comment  Room service appropriate? Yes   Fluid consistency: Thin      11/10/23 0428           DVT prophylaxis: SCDs Start: 11/10/23 0428  Level of care: Med-Surg   Code Status: Limited: Do not attempt resuscitation (DNR) -DNR-LIMITED -Do Not Intubate/DNI   Subjective: Patient is seen and examined today morning. She is lying in bed. Eating fair, has constipation. Enema only little help yesterday. Grand son at bedside said they appealed for transport.  Physical Exam: Vitals:   11/17/23 0008 11/17/23 1128 11/17/23 1621 11/17/23 2020  BP: (!) 146/65 (!) 125/50 119/63 (!) 129/53  Pulse: 88 79 86 86  Resp: 18 18 18 19   Temp: 98.4 F (36.9 C) 97.8 F (36.6 C) (!) 97.3 F (36.3 C) 98.8 F (37.1 C)  TempSrc: Oral  Oral Oral  SpO2: 97% 96% 98% 95%  Weight:      Height:        General - Elderly thin built Caucasian female, distress due to abdominal discomfort, pain. HEENT - PERRLA, EOMI, atraumatic head, non tender sinuses. Lung - Clear, no rales, rhonchi, wheezes. Heart - S1, S2 heard, no murmurs, rubs, no pedal edema. Abdomen - Soft, non tender, distended,  bowel sounds good Neuro - Alert, awake and oriented, non focal exam. Skin - Warm and dry. Back brace is on.  Data Reviewed:      Latest Ref Rng & Units 11/17/2023    9:08 AM 11/16/2023    4:37 AM 11/15/2023    5:01 AM  CBC  WBC 4.0 - 10.5 K/uL 3.4  4.4  3.2   Hemoglobin 12.0 - 15.0 g/dL 7.5  7.4  7.2   Hematocrit 36.0 - 46.0 % 22.8  22.8  21.8   Platelets 150 - 400 K/uL 276  285  233       Latest Ref Rng & Units 11/11/2023    4:30 AM 11/10/2023    5:55 AM 11/09/2023   11:00 PM  BMP  Glucose 70 - 99 mg/dL 892   888  857   BUN 8 - 23 mg/dL 10  13  12    Creatinine 0.44 - 1.00 mg/dL 9.36  9.35  9.37   Sodium 135 - 145 mmol/L 140  136  137   Potassium 3.5 - 5.1 mmol/L 3.9  3.9  3.9   Chloride 98 - 111 mmol/L 105  104  102   CO2 22 - 32 mmol/L 25  27  26    Calcium 8.9 - 10.3 mg/dL 9.0  8.9  9.6    No results found.   Family Communication: Discussed with patient, grand son at bedside. They understand and agree. All questions answered.  Disposition: Status is: Observation The patient remains OBS appropriate and will d/c before 2 midnights.  Planned Discharge Destination: Skilled nursing facility awaiting appeal for transportation.     Time spent: 44 minutes  Author: Concepcion Riser, MD 11/17/2023 9:27 PM Secure chat 7am to 7pm For on call review [email or web address].

## 2023-11-17 NOTE — Plan of Care (Signed)
  Problem: Education: Goal: Knowledge of General Education information will improve Description: Including pain rating scale, medication(s)/side effects and non-pharmacologic comfort measures Outcome: Progressing   Problem: Health Behavior/Discharge Planning: Goal: Ability to manage health-related needs will improve Outcome: Progressing   Problem: Clinical Measurements: Goal: Ability to maintain clinical measurements within normal limits will improve Outcome: Progressing Goal: Will remain free from infection Outcome: Progressing Goal: Diagnostic test results will improve Outcome: Progressing Goal: Respiratory complications will improve Outcome: Progressing Goal: Cardiovascular complication will be avoided Outcome: Progressing   Problem: Activity: Goal: Risk for activity intolerance will decrease Outcome: Progressing   Problem: Pain Managment: Goal: General experience of comfort will improve and/or be controlled Outcome: Progressing   Problem: Elimination: Goal: Will not experience complications related to bowel motility Outcome: Progressing Goal: Will not experience complications related to urinary retention Outcome: Progressing   Problem: Safety: Goal: Ability to remain free from injury will improve Outcome: Progressing   Problem: Skin Integrity: Goal: Risk for impaired skin integrity will decrease Outcome: Progressing

## 2023-11-17 NOTE — Progress Notes (Signed)
 OT Cancellation Note  Patient Details Name: DIANELY KREHBIEL MRN: [id number] DOB: [birth year]   Cancelled Treatment:    Reason Eval/Treat Not Completed: Patient declined, no reason specified;Pain limiting ability to participate Attempted this AM w/ pt deferring until later in AM. Arrived on second attempt with pt completing transfer to chair with nursing, citing pain and declined to engage in OT session. Will follow up at a later date.  Mliss Fish 11/17/2023, 10:13 AM

## 2023-11-18 ENCOUNTER — Inpatient Hospital Stay (HOSPITAL_COMMUNITY)

## 2023-11-18 DIAGNOSIS — K5900 Constipation, unspecified: Secondary | ICD-10-CM | POA: Diagnosis not present

## 2023-11-18 DIAGNOSIS — I878 Other specified disorders of veins: Secondary | ICD-10-CM | POA: Diagnosis not present

## 2023-11-18 DIAGNOSIS — S32011A Stable burst fracture of first lumbar vertebra, initial encounter for closed fracture: Secondary | ICD-10-CM | POA: Diagnosis not present

## 2023-11-18 MED ORDER — SENNOSIDES-DOCUSATE SODIUM 8.6-50 MG PO TABS
1.0000 | ORAL_TABLET | Freq: Two times a day (BID) | ORAL | Status: DC
  Administered 2023-11-18 – 2023-11-20 (×5): 1 via ORAL
  Filled 2023-11-18 (×5): qty 1

## 2023-11-18 MED ORDER — SENNOSIDES-DOCUSATE SODIUM 8.6-50 MG PO TABS: 1.0000 | ORAL_TABLET | Freq: Two times a day (BID) | ORAL | Status: DC | PRN

## 2023-11-18 MED ORDER — MAGNESIUM HYDROXIDE 400 MG/5ML PO SUSP
15.0000 mL | Freq: Once | ORAL | Status: AC
  Administered 2023-11-18: 15 mL via ORAL
  Filled 2023-11-18: qty 30

## 2023-11-18 NOTE — Progress Notes (Signed)
 Physical Therapy Treatment Patient Details Name: Olivia Evans MRN: [id number] DOB: 03/27/1926 Today's Date: 11/18/2023   History of Present Illness Olivia Evans is a 89 y.o. female admitted with c/o back pain s/p fall at home. MRI: Acute incomplete burst fracture of L1 with approximately 20% height loss and  5 mm retropulsion, narrowing of the right lateral recess at T12-L1 due to retropulsion from the L1 fracture, and small left asymmetric disc bulge at L2-3 and small disc bulge at L4-L5. PHMx: myelodysplastic syndrome, chronic HFpEF, RBBB, interstitial cystitis, arthritis, diverticulosis, heart murmur, R breast CA s/p mastectomy with radiation 2001, osteopenia, rotator cuff repair.    PT Comments  The pt is making gradual progress, ambulating an increased distance of up to ~170 ft. However, she began to fatigue in the final ~70 ft, needing to stand to rest often. She needs repeated cues to widen her BOS and stay proximal to her RW to improve her balance and safety when ambulating. She remains at risk for falls, needing up to [facility] for standing mobility. Will continue to follow acutely.    If plan is discharge home, recommend the following: A little help with walking and/or transfers;Assistance with cooking/housework;Help with stairs or ramp for entrance;Assist for transportation;A little help with bathing/dressing/bathroom   Can travel by private vehicle     Yes  Equipment Recommendations  Rolling walker (2 wheels);BSC/3in1    Recommendations for Other Services       Precautions / Restrictions Precautions Precautions: Fall;Back Precaution Booklet Issued: Yes (comment) Recall of Precautions/Restrictions: Impaired Precaution/Restrictions Comments: reviewed precautions Required Braces or Orthoses: Spinal Brace Spinal Brace: Other (comment) Spinal Brace Comments: pt is not tolerating TLSO brace well due to pt's kyphotic posture. Daughter reports pt has not been wearing brace  much Restrictions Weight Bearing Restrictions Per Provider Order: No     Mobility  Bed Mobility Overal bed mobility: Needs Assistance Bed Mobility: Sit to Sidelying         Sit to sidelying: Min assist, HOB elevated General bed mobility comments: MinA needed to lift legs and bring to midline in bed    Transfers Overall transfer level: Needs assistance Equipment used: Rolling walker (2 wheels) Transfers: Sit to/from Stand Sit to Stand: Min assist, Contact guard assist           General transfer comment: Pt varies in needing CGA-minA for stability transferring to stand    Ambulation/Gait Ambulation/Gait assistance: Contact guard assist, Min assist Gait Distance (Feet): 170 Feet Assistive device: Rolling walker (2 wheels) Gait Pattern/deviations: Step-through pattern, Decreased step length - left, Decreased step length - right, Trunk flexed, Narrow base of support, Decreased stride length Gait velocity: reduced Gait velocity interpretation: <1.31 ft/sec, indicative of household ambulator   General Gait Details: Pt ambulates with an inferior gaze, impacted by her kyphotic posture. She ambulates with her RW semi distally to her and her BOS narrow, needing multi-modal repeated cues to widen BOS and keep RW proximal. Several standing rest breaks during the final ~70 ft. CGA-minA for balance and RW management   Stairs             Wheelchair Mobility     Tilt Bed    Modified Rankin (Stroke Patients Only)       Balance Overall balance assessment: Needs assistance Sitting-balance support: Feet supported, No upper extremity supported Sitting balance-Leahy Scale: Fair     Standing balance support: Single extremity supported, Bilateral upper extremity supported, During functional activity Standing balance-Leahy Scale: Poor Standing  balance comment: reliant on UE support                            Communication Communication Communication: No  apparent difficulties  Cognition Arousal: Alert Behavior During Therapy: WFL for tasks assessed/performed   PT - Cognitive impairments: No apparent impairments                         Following commands: Intact      Cueing Cueing Techniques: Verbal cues, Tactile cues  Exercises      General Comments        Pertinent Vitals/Pain Pain Assessment Pain Assessment: Faces Faces Pain Scale: Hurts even more Pain Location: back Pain Descriptors / Indicators: Discomfort, Grimacing, Guarding Pain Intervention(s): Monitored during session, Limited activity within patient's tolerance, Repositioned, Patient requesting pain meds-RN notified    Home Living                          Prior Function            PT Goals (current goals can now be found in the care plan section) Acute Rehab PT Goals Patient Stated Goal: to get stronger, reduce pain PT Goal Formulation: With patient/family Time For Goal Achievement: 11/24/23 Potential to Achieve Goals: Good Progress towards PT goals: Progressing toward goals    Frequency    Min 2X/week      PT Plan      Co-evaluation              AM-PAC PT 6 Clicks Mobility   Outcome Measure  Help needed turning from your back to your side while in a flat bed without using bedrails?: A Little Help needed moving from lying on your back to sitting on the side of a flat bed without using bedrails?: A Little Help needed moving to and from a bed to a chair (including a wheelchair)?: A Little Help needed standing up from a chair using your arms (e.g., wheelchair or bedside chair)?: A Little Help needed to walk in hospital room?: A Little Help needed climbing 3-5 steps with a railing? : A Lot 6 Click Score: 17    End of Session Equipment Utilized During Treatment: Back brace Activity Tolerance: Patient tolerated treatment well Patient left: with call bell/phone within reach;with family/visitor present;in bed;with bed  alarm set   PT Visit Diagnosis: Unsteadiness on feet (R26.81);Other abnormalities of gait and mobility (R26.89);Muscle weakness (generalized) (M62.81);Pain;Difficulty in walking, not elsewhere classified (R26.2) Pain - Right/Left:  (back) Pain - part of body:  (back)     Time: 1530-1600 PT Time Calculation (min) (ACUTE ONLY): 30 min  Charges:    $Gait Training: 8-22 mins $Therapeutic Activity: 8-22 mins PT General Charges $$ ACUTE PT VISIT: 1 Visit                     Theo Ferretti, PT, DPT Acute Rehabilitation Services  Office: [phone]    Theo CHRISTELLA Ferretti 11/18/2023, 5:16 PM

## 2023-11-18 NOTE — Progress Notes (Signed)
 PROGRESS NOTE    Olivia Evans  FMW:[id number] DOB: [birth year] DOA: 11/09/2023 PCP: Geofm Glade PARAS, MD     Brief Narrative:  Olivia Evans is a 89 y.o. female with medical history significant for myelodysplastic syndrome, chronic HFpEF, and interstitial cystitis who presents with low back pain after a fall at home.  She was found to have L1 burst fracture. Seen by NSGY, recommending TLSO when OOB x3 months.  Outpatient NSGY f/u in 2 weeks.  Pending placement to skilled nursing facility/rehab.  Patient is medically ready awaiting placement.  New events last 24 hours / Subjective: Patient having significant constipation.  States that she suffers from constipation at baseline, but with this hospitalization as well as with iron pills and narcotic use, it has been worse.  She did get an enema 2 nights ago without relief.  Assessment & Plan:   Principal Problem:   Closed L1 vertebral fracture (HCC) Active Problems:   Myelodysplasia (myelodysplastic syndrome) (HCC)   Chronic heart failure with preserved ejection fraction (HFpEF) (HCC)   Acute incomplete burst fracture of L1, present on admission -MRI showed incomplete L1 burst fracture with 20% height loss and 5 mm retropulsion c/w L1 A3 injury - narrowing of R lateral recess at T12-L1 due to retropulsion from L1 fracture.  Small L asymmetric disc bulge at L2-3 and small disc bulge at L4-L5 -Neurosurgery recommending TLSO brace when OOB x3 months (don brace sitting and wear when out of bed) - follow up in 2 weeks (nsgy note 10/21, Dr. Joshua) -Oxycontin  10 mg every 12 hours.  Continue with as needed oxycodone  for breakthrough pain. Continue constipation regimen. -PT and OT on board.  TOC stated that she has skilled nursing facility bed available, but transportation not covered. Family donot think she can go in private vehicle, appealed for transportation.   MDS  -Follows with Dr. Sherrod  -Trend H/H    Chronic HFpEF: Not in  exacerbation -EF was preserved on echo from October 2023  -Appears compensated, continue torsemide  every other day    Irritable bladder syndrome, POA -Outpatient follow-up with urology   Constipation -In the setting of opiate use, immobility. -Continue constipation regimen. -Enema as needed ordered. -MiraLAX, senna, check KUB today      DVT prophylaxis:  SCDs Start: 11/10/23 0428  Code Status: DNR Family Communication: Daughter at bedside Disposition Plan: SNF Status is: Inpatient Remains inpatient appropriate because: SNF placement    Antimicrobials:  Anti-infectives (From admission, onward)    None        Objective: Vitals:   11/17/23 1621 11/17/23 2020 11/18/23 0809 11/18/23 1147  BP: 119/63 (!) 129/53 (!) 129/50 (!) 134/59  Pulse: 86 86 (!) 101 86  Resp: 18 19 18 18   Temp:   98 F (36.7 C) 98.1 F (36.7 C)  TempSrc: Oral Oral Oral Oral  SpO2: 98% 95% 95% 95%  Weight:      Height:        Intake/Output Summary (Last 24 hours) at 11/18/2023 1324 Last data filed at 11/18/2023 0830 Gross per 24 hour  Intake 218 ml  Output --  Net 218 ml   Filed Weights   11/09/23 2221  Weight: 59 kg    Examination:  General exam: Appears calm and comfortable  Respiratory system: Clear to auscultation. Respiratory effort normal. No respiratory distress. No conversational dyspnea.  Cardiovascular system: S1 & S2 heard, RRR. No murmurs. No pedal edema. Gastrointestinal system: Abdomen is nondistended, soft and nontender. Normal bowel sounds  heard. Central nervous system: Alert and oriented. No focal neurological deficits. Speech clear.  Extremities: Symmetric in appearance  Skin: No rashes, lesions or ulcers on exposed skin  Psychiatry: Judgement and insight appear normal. Mood & affect appropriate.   Data Reviewed: I have personally reviewed following labs and imaging studies  CBC: Recent Labs  Lab 11/15/23 0501 11/16/23 0437 11/17/23 0908  WBC 3.2* 4.4  3.4*  HGB 7.2* 7.4* 7.5*  HCT 21.8* 22.8* 22.8*  MCV 110.7* 110.7* 110.7*  PLT 233 285 276   Basic Metabolic Panel: No results for input(s): NA, K, CL, CO2, GLUCOSE, BUN, CREATININE, CALCIUM, MG, PHOS in the last 168 hours. GFR: Estimated Creatinine Clearance: 36.3 mL/min (by C-G formula based on SCr of 0.63 mg/dL). Liver Function Tests: No results for input(s): AST, ALT, ALKPHOS, BILITOT, PROT, ALBUMIN in the last 168 hours. No results for input(s): LIPASE, AMYLASE in the last 168 hours. No results for input(s): AMMONIA in the last 168 hours. Coagulation Profile: No results for input(s): INR, PROTIME in the last 168 hours. Cardiac Enzymes: No results for input(s): CKTOTAL, CKMB, CKMBINDEX, TROPONINI in the last 168 hours. BNP (last 3 results) No results for input(s): PROBNP in the last 8760 hours. HbA1C: No results for input(s): HGBA1C in the last 72 hours. CBG: No results for input(s): GLUCAP in the last 168 hours. Lipid Profile: No results for input(s): CHOL, HDL, LDLCALC, TRIG, CHOLHDL, LDLDIRECT in the last 72 hours. Thyroid  Function Tests: No results for input(s): TSH, T4TOTAL, FREET4, T3FREE, THYROIDAB in the last 72 hours. Anemia Panel: No results for input(s): VITAMINB12, FOLATE, FERRITIN, TIBC, IRON, RETICCTPCT in the last 72 hours. Sepsis Labs: No results for input(s): PROCALCITON, LATICACIDVEN in the last 168 hours.  No results found for this or any previous visit (from the past 240 hours).    Radiology Studies: No results found.    Scheduled Meds:  calcitonin (salmon)  1 spray Alternating Nares Daily   ferrous sulfate  325 mg Oral QODAY   lidocaine   2 patch Transdermal Q24H   oxyCODONE   10 mg Oral Q12H   polyethylene glycol  17 g Oral Daily   senna-docusate  1 tablet Oral BID   torsemide   20 mg Oral QODAY   Continuous Infusions:   LOS: 2 days   Time spent:  40 minutes   Delon Hoe, DO [facility] 11/18/2023, 1:24 PM   Available via Epic secure chat 7am-7pm After these hours, please refer to coverage provider listed on [email or web address]

## 2023-11-18 NOTE — Plan of Care (Addendum)
 Family requested pt not be disturbed tonight while resting to obtain vital signs or scheduled AM labwork. Vitals at start of shift stable and WNL. Vital signs refused at 00:00 and 04:00. Phlebotomist informed primary RN of blood draw refusal by bedside family member. Family agreeable with phlebotomist returning at later time in the morning to draw labwork.   Problem: Education: Goal: Knowledge of General Education information will improve Description: Including pain rating scale, medication(s)/side effects and non-pharmacologic comfort measures Outcome: Progressing   Problem: Health Behavior/Discharge Planning: Goal: Ability to manage health-related needs will improve Outcome: Progressing   Problem: Clinical Measurements: Goal: Ability to maintain clinical measurements within normal limits will improve Outcome: Progressing Goal: Will remain free from infection Outcome: Progressing Goal: Diagnostic test results will improve Outcome: Progressing Goal: Respiratory complications will improve Outcome: Progressing Goal: Cardiovascular complication will be avoided Outcome: Progressing   Problem: Activity: Goal: Risk for activity intolerance will decrease Outcome: Progressing   Problem: Nutrition: Goal: Adequate nutrition will be maintained Outcome: Progressing   Problem: Coping: Goal: Level of anxiety will decrease Outcome: Progressing   Problem: Elimination: Goal: Will not experience complications related to bowel motility Outcome: Progressing Goal: Will not experience complications related to urinary retention Outcome: Progressing   Problem: Pain Managment: Goal: General experience of comfort will improve and/or be controlled Outcome: Progressing   Problem: Safety: Goal: Ability to remain free from injury will improve Outcome: Progressing   Problem: Skin Integrity: Goal: Risk for impaired skin integrity will decrease Outcome: Progressing

## 2023-11-18 NOTE — Progress Notes (Signed)
 Mobility Specialist: Progress Note   11/18/23 1600  Mobility  Activity Ambulated with assistance  Level of Assistance Contact guard assist, steadying assist  Assistive Device Front wheel walker  Distance Ambulated (ft) 15 ft  Activity Response Tolerated well  Mobility Referral Yes  Mobility visit 1 Mobility  Mobility Specialist Start Time (ACUTE ONLY) 1525  Mobility Specialist Stop Time (ACUTE ONLY) 1533  Mobility Specialist Time Calculation (min) (ACUTE ONLY) 8 min    Pt received in bed, agreeable to mobility session. SV for bed mobility. TLSO brace donned at EOB. Light minA for STS. CGA for ambulation, requesting to use BR first. Ambulated to the BR, required light minA to sit/guide her bottom down onto elevated commode seat. Passed pt onto PT. Left on commode with all needs met, PT and daughter in room.   Ileana Lute Mobility Specialist Please contact via SecureChat or Rehab office at [phone]

## 2023-11-18 NOTE — Plan of Care (Signed)

## 2023-11-19 DIAGNOSIS — S32011A Stable burst fracture of first lumbar vertebra, initial encounter for closed fracture: Secondary | ICD-10-CM | POA: Diagnosis not present

## 2023-11-19 LAB — URINALYSIS, COMPLETE (UACMP) WITH MICROSCOPIC
Bilirubin Urine: NEGATIVE
Glucose, UA: NEGATIVE mg/dL
Hgb urine dipstick: NEGATIVE
Ketones, ur: 5 mg/dL — AB
Nitrite: NEGATIVE
Protein, ur: NEGATIVE mg/dL
Specific Gravity, Urine: 1.008 (ref 1.005–1.030)
pH: 8 (ref 5.0–8.0)

## 2023-11-19 NOTE — Progress Notes (Signed)
 Occupational Therapy Treatment Patient Details Name: Olivia Evans MRN: [id number] DOB: 03/09/1926 Today's Date: 11/19/2023   History of present illness Olivia Evans is a 89 y.o. female admitted with c/o back pain s/p fall at home. MRI: Acute incomplete burst fracture of L1 with approximately 20% height loss and  5 mm retropulsion, narrowing of the right lateral recess at T12-L1 due to retropulsion from the L1 fracture, and small left asymmetric disc bulge at L2-3 and small disc bulge at L4-L5. PHMx: myelodysplastic syndrome, chronic HFpEF, RBBB, interstitial cystitis, arthritis, diverticulosis, heart murmur, R breast CA s/p mastectomy with radiation 2001, osteopenia, rotator cuff repair.   OT comments  Patient continues to demonstrate gains with bed mobility, transfers, and self care standing at sink. Patient with decreased complaints of pain with mobility but increased pain when sitting in recliner due to back brace.  Patient will benefit from continued inpatient follow up therapy, <3 hours/day.  Acute OT to continue to follow to address established goals to facilitate DC to next venue of care.        If plan is discharge home, recommend the following:  A little help with walking and/or transfers;A lot of help with bathing/dressing/bathroom;Assistance with cooking/housework;Help with stairs or ramp for entrance;Assist for transportation   Equipment Recommendations  None recommended by OT    Recommendations for Other Services      Precautions / Restrictions Precautions Precautions: Fall;Back Precaution Booklet Issued: Yes (comment) Recall of Precautions/Restrictions: Impaired Precaution/Restrictions Comments: reviewed precautions Required Braces or Orthoses: Spinal Brace Spinal Brace Comments: patient states discomfort when wearing TLSO while up in chair Restrictions Weight Bearing Restrictions Per Provider Order: No       Mobility Bed Mobility Overal bed mobility: Needs  Assistance Bed Mobility: Supine to Sit     Supine to sit: Contact guard, HOB elevated, Used rails     General bed mobility comments: increased time and cues for back precautions    Transfers Overall transfer level: Needs assistance Equipment used: Rolling walker (2 wheels) Transfers: Sit to/from Stand Sit to Stand: Min assist, Contact guard assist           General transfer comment: min to CGA to stand from EOB and other surfaces with cues for hand placement     Balance Overall balance assessment: Needs assistance Sitting-balance support: Feet supported, No upper extremity supported Sitting balance-Leahy Scale: Fair     Standing balance support: Single extremity supported, Bilateral upper extremity supported, During functional activity Standing balance-Leahy Scale: Poor Standing balance comment: reliant on UE support                           ADL either performed or assessed with clinical judgement   ADL Overall ADL's : Needs assistance/impaired     Grooming: Wash/dry hands;Wash/dry face;Contact guard assist;Standing Grooming Details (indicate cue type and reason): at sink                 Toilet Transfer: Contact guard assist;Cueing for safety;Ambulation;Regular Toilet;Rolling walker (2 wheels) Toilet Transfer Details (indicate cue type and reason): cues for safety with walker use Toileting- Clothing Manipulation and Hygiene: Supervision/safety;Sitting/lateral lean Toileting - Clothing Manipulation Details (indicate cue type and reason): performed in sitting with cues for back precautions            Extremity/Trunk Assessment              Vision       Perception  Praxis     Communication Communication Communication: No apparent difficulties Factors Affecting Communication: Hearing impaired   Cognition Arousal: Alert Behavior During Therapy: WFL for tasks assessed/performed Cognition: No apparent impairments                                Following commands: Intact        Cueing   Cueing Techniques: Verbal cues, Tactile cues  Exercises      Shoulder Instructions       General Comments performed mobility in hallway with patient requiring cues to avoid obstical to the left    Pertinent Vitals/ Pain       Pain Assessment Pain Assessment: Faces Faces Pain Scale: Hurts little more Pain Location: back when sitting in recliner Pain Descriptors / Indicators: Discomfort, Grimacing Pain Intervention(s): Limited activity within patient's tolerance, Monitored during session, Repositioned  Home Living                                          Prior Functioning/Environment              Frequency  Min 2X/week        Progress Toward Goals  OT Goals(current goals can now be found in the care plan section)  Progress towards OT goals: Progressing toward goals  Acute Rehab OT Goals Patient Stated Goal: go to SNF for more rehab OT Goal Formulation: With patient Time For Goal Achievement: 11/24/23 Potential to Achieve Goals: Good ADL Goals Pt Will Perform Grooming: with supervision;standing Pt Will Perform Lower Body Dressing: with set-up;with supervision;with adaptive equipment;sit to/from stand Pt Will Transfer to Toilet: with supervision;ambulating;bedside commode (over toilet) Pt Will Perform Toileting - Clothing Manipulation and hygiene: with supervision;sit to/from stand Additional ADL Goal #1: Pt will be Mod I in and OOB for basic ADLs (she normally sleeps in recliner)  Plan      Co-evaluation                 AM-PAC OT 6 Clicks Daily Activity     Outcome Measure   Help from another person eating meals?: None Help from another person taking care of personal grooming?: A Little Help from another person toileting, which includes using toliet, bedpan, or urinal?: A Lot Help from another person bathing (including washing, rinsing, drying)?: A  Lot Help from another person to put on and taking off regular upper body clothing?: A Little Help from another person to put on and taking off regular lower body clothing?: A Lot 6 Click Score: 16    End of Session Equipment Utilized During Treatment: Gait belt;Rolling walker (2 wheels);Back brace  OT Visit Diagnosis: Unsteadiness on feet (R26.81);Other abnormalities of gait and mobility (R26.89);History of falling (Z91.81);Pain Pain - part of body:  (back)   Activity Tolerance Patient tolerated treatment well   Patient Left in chair;with call bell/phone within reach;with family/visitor present   Nurse Communication Mobility status;Precautions        Time: 8782-8750 OT Time Calculation (min): 32 min  Charges: OT General Charges $OT Visit: 1 Visit OT Treatments $Self Care/Home Management : 8-22 mins $Therapeutic Activity: 8-22 mins  Dick Laine, OTA Acute Rehabilitation Services  Office [phone]   Jeb LITTIE Laine 11/19/2023, 2:36 PM

## 2023-11-19 NOTE — Progress Notes (Signed)
 [facility] night cross cover note:   I was notified by the patient's RN that the patient's family feels that the patient is slightly more confused relative to her baseline.  Per patient's RN, patient remains alert and oriented to person, place, and time, but occasionally forgets why she is in the hospital.  Patient's family requests updated urinalysis to assess for UTI.  Per family's request, I subsequently ordered an updated urinalysis.     Eva Pore, DO Hospitalist

## 2023-11-19 NOTE — Progress Notes (Signed)
 PROGRESS NOTE    AEON KOORS  FMW:[id number] DOB: 1926-10-07 DOA: 11/09/2023 PCP: Geofm Glade PARAS, MD     Brief Narrative:  Olivia Evans is a 89 y.o. female with medical history significant for myelodysplastic syndrome, chronic HFpEF, and interstitial cystitis who presents with low back pain after a fall at home.  She was found to have L1 burst fracture. Seen by NSGY, recommending TLSO when OOB x3 months.  Outpatient NSGY f/u in 2 weeks.  Pending placement to skilled nursing facility/rehab.  Patient is medically ready awaiting placement.  New events last 24 hours / Subjective: Had a small bowel movement yesterday.  Otherwise doing well.  Currently awaiting insurance to approve transportation to SNF.  Assessment & Plan:   Principal Problem:   Closed L1 vertebral fracture (HCC) Active Problems:   Myelodysplasia (myelodysplastic syndrome) (HCC)   Chronic heart failure with preserved ejection fraction (HFpEF) (HCC)   Acute incomplete burst fracture of L1, present on admission -MRI showed incomplete L1 burst fracture with 20% height loss and 5 mm retropulsion c/w L1 A3 injury - narrowing of R lateral recess at T12-L1 due to retropulsion from L1 fracture.  Small L asymmetric disc bulge at L2-3 and small disc bulge at L4-L5 -Neurosurgery recommending TLSO brace when OOB x3 months (don brace sitting and wear when out of bed) - follow up in 2 weeks (nsgy note 10/21, Dr. Joshua) -Oxycontin  10 mg every 12 hours.  Continue with as needed oxycodone  for breakthrough pain. Continue constipation regimen. -PT and OT on board.  TOC stated that she has skilled nursing facility bed available, but transportation not covered. Family do not think she can go in private vehicle, appealed for transportation.   MDS  -Follows with Dr. Sherrod  -Trend H/H    Chronic HFpEF: Not in exacerbation -EF was preserved on echo from October 2023  -Appears compensated, continue torsemide  every other day     Irritable bladder syndrome, POA -Outpatient follow-up with urology   Constipation -In the setting of opiate use, immobility. -Continue constipation regimen. -MiraLAX, senna, suppository as needed      DVT prophylaxis:  SCDs Start: 11/10/23 0428  Code Status: DNR Family Communication: Daughter at bedside Disposition Plan: SNF Status is: Inpatient Remains inpatient appropriate because: SNF placement    Antimicrobials:  Anti-infectives (From admission, onward)    None        Objective: Vitals:   11/18/23 1700 11/18/23 2024 11/19/23 0535 11/19/23 0747  BP: (!) 131/53 (!) 118/47 (!) 153/59 (!) 119/41  Pulse: 86 76 85 93  Resp: 18 16 18 16   Temp: 98.8 F (37.1 C) 97.8 F (36.6 C) 98.3 F (36.8 C) (!) 97.4 F (36.3 C)  TempSrc: Oral  Oral Oral  SpO2: 93% 92% 95% 90%  Weight:      Height:       No intake or output data in the 24 hours ending 11/19/23 1126  Filed Weights   11/09/23 2221  Weight: 59 kg    Examination:  General exam: Appears calm and comfortable  Respiratory system: Clear to auscultation. Respiratory effort normal. No respiratory distress. No conversational dyspnea.  Cardiovascular system: S1 & S2 heard, RRR. No murmurs. No pedal edema. Gastrointestinal system: Abdomen is nondistended, soft and nontender. Normal bowel sounds heard. Central nervous system: Alert and oriented. No focal neurological deficits. Speech clear.  Extremities: Symmetric in appearance  Skin: No rashes, lesions or ulcers on exposed skin  Psychiatry: Judgement and insight appear normal. Mood &  affect appropriate.   Data Reviewed: I have personally reviewed following labs and imaging studies  CBC: Recent Labs  Lab 11/15/23 0501 11/16/23 0437 11/17/23 0908  WBC 3.2* 4.4 3.4*  HGB 7.2* 7.4* 7.5*  HCT 21.8* 22.8* 22.8*  MCV 110.7* 110.7* 110.7*  PLT 233 285 276   Basic Metabolic Panel: No results for input(s): NA, K, CL, CO2, GLUCOSE, BUN, CREATININE,  CALCIUM, MG, PHOS in the last 168 hours. GFR: Estimated Creatinine Clearance: 36.3 mL/min (by C-G formula based on SCr of 0.63 mg/dL). Liver Function Tests: No results for input(s): AST, ALT, ALKPHOS, BILITOT, PROT, ALBUMIN in the last 168 hours. No results for input(s): LIPASE, AMYLASE in the last 168 hours. No results for input(s): AMMONIA in the last 168 hours. Coagulation Profile: No results for input(s): INR, PROTIME in the last 168 hours. Cardiac Enzymes: No results for input(s): CKTOTAL, CKMB, CKMBINDEX, TROPONINI in the last 168 hours. BNP (last 3 results) No results for input(s): PROBNP in the last 8760 hours. HbA1C: No results for input(s): HGBA1C in the last 72 hours. CBG: No results for input(s): GLUCAP in the last 168 hours. Lipid Profile: No results for input(s): CHOL, HDL, LDLCALC, TRIG, CHOLHDL, LDLDIRECT in the last 72 hours. Thyroid  Function Tests: No results for input(s): TSH, T4TOTAL, FREET4, T3FREE, THYROIDAB in the last 72 hours. Anemia Panel: No results for input(s): VITAMINB12, FOLATE, FERRITIN, TIBC, IRON, RETICCTPCT in the last 72 hours. Sepsis Labs: No results for input(s): PROCALCITON, LATICACIDVEN in the last 168 hours.  No results found for this or any previous visit (from the past 240 hours).    Radiology Studies: DG Abd 1 View Result Date: 11/18/2023 EXAM: 1 VIEW XRAY OF THE ABDOMEN 11/18/2023 10:42:00 AM COMPARISON: 11/13/2023 CLINICAL HISTORY: Constipation FINDINGS: BOWEL: Nonobstructive bowel gas pattern. Moderate colonic stool burden diffusely throughout the colon. SOFT TISSUES: Pelvic phleboliths. Atherosclerotic calcifications. No opaque urinary calculi. BONES: No acute osseous abnormality. IMPRESSION: 1. Moderate colonic stool burden diffusely throughout the colon. Electronically signed by: Norman Gatlin MD 11/18/2023 02:28 PM EDT RP Workstation: [id number]       Scheduled Meds:  calcitonin (salmon)  1 spray Alternating Nares Daily   ferrous sulfate  325 mg Oral QODAY   lidocaine   2 patch Transdermal Q24H   oxyCODONE   10 mg Oral Q12H   polyethylene glycol  17 g Oral Daily   senna-docusate  1 tablet Oral BID   torsemide   20 mg Oral QODAY   Continuous Infusions:   LOS: 3 days   Time spent: 25 minutes   Delon Hoe, DO [facility] 11/19/2023, 11:26 AM   Available via Epic secure chat 7am-7pm After these hours, please refer to coverage provider listed on [email or web address]

## 2023-11-19 NOTE — Plan of Care (Signed)

## 2023-11-20 ENCOUNTER — Other Ambulatory Visit (HOSPITAL_COMMUNITY): Payer: Self-pay

## 2023-11-20 DIAGNOSIS — S32019D Unspecified fracture of first lumbar vertebra, subsequent encounter for fracture with routine healing: Secondary | ICD-10-CM | POA: Diagnosis not present

## 2023-11-20 DIAGNOSIS — I5022 Chronic systolic (congestive) heart failure: Secondary | ICD-10-CM | POA: Diagnosis not present

## 2023-11-20 DIAGNOSIS — S32012A Unstable burst fracture of first lumbar vertebra, initial encounter for closed fracture: Secondary | ICD-10-CM | POA: Diagnosis not present

## 2023-11-20 DIAGNOSIS — S32010G Wedge compression fracture of first lumbar vertebra, subsequent encounter for fracture with delayed healing: Secondary | ICD-10-CM | POA: Diagnosis not present

## 2023-11-20 DIAGNOSIS — Z4789 Encounter for other orthopedic aftercare: Secondary | ICD-10-CM | POA: Diagnosis not present

## 2023-11-20 DIAGNOSIS — D649 Anemia, unspecified: Secondary | ICD-10-CM | POA: Diagnosis not present

## 2023-11-20 DIAGNOSIS — M810 Age-related osteoporosis without current pathological fracture: Secondary | ICD-10-CM | POA: Diagnosis not present

## 2023-11-20 DIAGNOSIS — I480 Paroxysmal atrial fibrillation: Secondary | ICD-10-CM | POA: Diagnosis not present

## 2023-11-20 DIAGNOSIS — R2689 Other abnormalities of gait and mobility: Secondary | ICD-10-CM | POA: Diagnosis not present

## 2023-11-20 DIAGNOSIS — M6281 Muscle weakness (generalized): Secondary | ICD-10-CM | POA: Diagnosis not present

## 2023-11-20 DIAGNOSIS — G8929 Other chronic pain: Secondary | ICD-10-CM | POA: Diagnosis not present

## 2023-11-20 DIAGNOSIS — M5459 Other low back pain: Secondary | ICD-10-CM | POA: Diagnosis not present

## 2023-11-20 DIAGNOSIS — D469 Myelodysplastic syndrome, unspecified: Secondary | ICD-10-CM | POA: Diagnosis not present

## 2023-11-20 DIAGNOSIS — M170 Bilateral primary osteoarthritis of knee: Secondary | ICD-10-CM | POA: Diagnosis not present

## 2023-11-20 DIAGNOSIS — I83811 Varicose veins of right lower extremities with pain: Secondary | ICD-10-CM | POA: Diagnosis not present

## 2023-11-20 DIAGNOSIS — I10 Essential (primary) hypertension: Secondary | ICD-10-CM | POA: Diagnosis not present

## 2023-11-20 DIAGNOSIS — N3010 Interstitial cystitis (chronic) without hematuria: Secondary | ICD-10-CM | POA: Diagnosis not present

## 2023-11-20 DIAGNOSIS — K5900 Constipation, unspecified: Secondary | ICD-10-CM | POA: Diagnosis not present

## 2023-11-20 DIAGNOSIS — S32012D Unstable burst fracture of first lumbar vertebra, subsequent encounter for fracture with routine healing: Secondary | ICD-10-CM | POA: Diagnosis not present

## 2023-11-20 DIAGNOSIS — K219 Gastro-esophageal reflux disease without esophagitis: Secondary | ICD-10-CM | POA: Diagnosis not present

## 2023-11-20 DIAGNOSIS — S32010D Wedge compression fracture of first lumbar vertebra, subsequent encounter for fracture with routine healing: Secondary | ICD-10-CM | POA: Diagnosis not present

## 2023-11-20 DIAGNOSIS — S32011A Stable burst fracture of first lumbar vertebra, initial encounter for closed fracture: Secondary | ICD-10-CM | POA: Diagnosis not present

## 2023-11-20 DIAGNOSIS — R251 Tremor, unspecified: Secondary | ICD-10-CM | POA: Diagnosis not present

## 2023-11-20 DIAGNOSIS — E441 Mild protein-calorie malnutrition: Secondary | ICD-10-CM | POA: Diagnosis not present

## 2023-11-20 DIAGNOSIS — M4856XA Collapsed vertebra, not elsewhere classified, lumbar region, initial encounter for fracture: Secondary | ICD-10-CM | POA: Diagnosis not present

## 2023-11-20 DIAGNOSIS — E119 Type 2 diabetes mellitus without complications: Secondary | ICD-10-CM | POA: Diagnosis not present

## 2023-11-20 DIAGNOSIS — I5032 Chronic diastolic (congestive) heart failure: Secondary | ICD-10-CM | POA: Diagnosis not present

## 2023-11-20 DIAGNOSIS — M542 Cervicalgia: Secondary | ICD-10-CM | POA: Diagnosis not present

## 2023-11-20 DIAGNOSIS — Z853 Personal history of malignant neoplasm of breast: Secondary | ICD-10-CM | POA: Diagnosis not present

## 2023-11-20 DIAGNOSIS — E559 Vitamin D deficiency, unspecified: Secondary | ICD-10-CM | POA: Diagnosis not present

## 2023-11-20 DIAGNOSIS — R2681 Unsteadiness on feet: Secondary | ICD-10-CM | POA: Diagnosis not present

## 2023-11-20 MED ORDER — LIDOCAINE 5 % EX PTCH: 2.0000 | MEDICATED_PATCH | CUTANEOUS | 0 refills | Status: AC

## 2023-11-20 MED ORDER — SENNOSIDES-DOCUSATE SODIUM 8.6-50 MG PO TABS: 1.0000 | ORAL_TABLET | Freq: Two times a day (BID) | ORAL | Status: AC

## 2023-11-20 MED ORDER — OXYCODONE HCL 5 MG PO TABS: 5.0000 mg | ORAL_TABLET | ORAL | 0 refills | Status: DC | PRN

## 2023-11-20 MED ORDER — OXYCODONE HCL ER 10 MG PO T12A: 10.0000 mg | EXTENDED_RELEASE_TABLET | Freq: Two times a day (BID) | ORAL | 0 refills | Status: DC

## 2023-11-20 MED ORDER — POLYETHYLENE GLYCOL 3350 17 G PO PACK: 17.0000 g | PACK | Freq: Every day | ORAL | Status: AC

## 2023-11-20 MED ORDER — CALCITONIN (SALMON) 200 UNIT/ACT NA SOLN: 1.0000 | Freq: Every day | NASAL | Status: AC

## 2023-11-20 MED ORDER — METHOCARBAMOL 500 MG PO TABS: 500.0000 mg | ORAL_TABLET | Freq: Four times a day (QID) | ORAL | 0 refills | Status: AC | PRN

## 2023-11-20 NOTE — Progress Notes (Signed)
 Reviewed AVS, patient expressed understanding of medications, MD follow up reviewed.   See LDA for information on wounds at discharge. Patient states all belongings brought to the hospital at time of admission are accounted for and packed to take home.  Patient informed and expressed understanding where to pick up discharge medications.  [facility] contacted to transport patient to Discharge lounge to wait for transportation home.

## 2023-11-20 NOTE — Plan of Care (Addendum)
 Family requested no 0000; & 0400 vital signs. Checked on pt and family throughout night. No complaints. Family requested to allow pt to get rest. Pt and family agrees to resume VS at later hour once pt is awake.  9385: Pt in bed; respirations present. No s/s of distress. Family remains at [facility]; denies any needs at this time.   Problem: Education: Goal: Knowledge of General Education information will improve Description: Including pain rating scale, medication(s)/side effects and non-pharmacologic comfort measures Outcome: Progressing   Problem: Health Behavior/Discharge Planning: Goal: Ability to manage health-related needs will improve Outcome: Progressing   Problem: Clinical Measurements: Goal: Ability to maintain clinical measurements within normal limits will improve Outcome: Progressing Goal: Will remain free from infection Outcome: Progressing Goal: Diagnostic test results will improve Outcome: Progressing Goal: Respiratory complications will improve Outcome: Progressing Goal: Cardiovascular complication will be avoided Outcome: Progressing   Problem: Activity: Goal: Risk for activity intolerance will decrease Outcome: Progressing   Problem: Nutrition: Goal: Adequate nutrition will be maintained Outcome: Progressing   Problem: Coping: Goal: Level of anxiety will decrease Outcome: Progressing   Problem: Elimination: Goal: Will not experience complications related to bowel motility Outcome: Progressing Goal: Will not experience complications related to urinary retention Outcome: Progressing   Problem: Pain Managment: Goal: General experience of comfort will improve and/or be controlled Outcome: Progressing   Problem: Safety: Goal: Ability to remain free from injury will improve Outcome: Progressing   Problem: Skin Integrity: Goal: Risk for impaired skin integrity will decrease Outcome: Progressing

## 2023-11-20 NOTE — Care Management Important Message (Signed)
 Important Message  Patient Details  Name: WM FRUCHTER MRN: [id number] Date of Birth: 1926/11/17   Important Message Given:  Yes - Medicare IM   Patient left prior to IM delivery will mail a copy to the patient home addressl.  Zaidy Absher 11/20/2023, 4:40 PM

## 2023-11-20 NOTE — TOC Progression Note (Addendum)
 Transition of Care [facility]) - Progression Note    Patient Details  Name: Olivia Evans MRN: [id number] Date of Birth: 1926/02/10  [facility]) CM/SW Contact  Sherline Clack, [place] Phone Number: 11/20/2023, 8:50 AM  Clinical Narrative:     Update 10:17 AM: CSW spoke with patient's daughter at bedside regarding SNF auth expiring today. Patient's daughter informed CSW her and her family members had set up private pay transportation for the patient in case [employer] did not contact CSW with an approval. Tranpsortation is scheduled for 2 pm. Provider and nurse made aware. Patient to discharge today to [facility].   CSW spoke with Crystal from [facility] this morning regarding patient's discharge to SNF. Crystal asked if patient was still planning to transfer to SNF and asked when. CSW informed Crystal family wanted to wait to hear back about the ambulance appeal. Crystal is unsure if a response will be given today and told CSW patient's auth expires today at 5 pm. CSW will make provider and family aware. CSW reached out to Darrian at [facility] to ask about bed availability. Patient will have a bed if she is able to discharge today. CSW will continue to follow.   Expected Discharge Plan: Skilled Nursing Facility Barriers to Discharge: Continued Medical Work up               Expected Discharge Plan and Services In-house Referral: Clinical Social Work Discharge Planning Services: CM Consult Post Acute Care Choice: Skilled Nursing Facility Living arrangements for the past 2 months: Single Family Home                                       Social Drivers of Health (SDOH) Interventions SDOH Screenings   Food Insecurity: No Food Insecurity (11/10/2023)  Housing: Low Risk  (11/10/2023)  Transportation Needs: No Transportation Needs (11/10/2023)  Utilities: Not At Risk (11/10/2023)  Alcohol Screen: Low Risk  (05/11/2023)  Depression (PHQ2-9): Low Risk   (05/11/2023)  Financial Resource Strain: Low Risk  (05/11/2023)  Physical Activity: Inactive (05/11/2023)  Social Connections: Moderately Isolated (11/10/2023)  Stress: No Stress Concern Present (05/11/2023)  Tobacco Use: Low Risk  (11/10/2023)  Health Literacy: Adequate Health Literacy (05/11/2023)    Readmission Risk Interventions     No data to display

## 2023-11-20 NOTE — TOC Transition Note (Signed)
 Transition of Care [facility]) - Discharge Note   Patient Details  Name: Olivia Evans MRN: [id number] Date of Birth: [birth year]  Transition of Care [facility]) CM/SW Contact:  Sherline Clack, LCSWA Phone Number: 11/20/2023, 2:15 PM   Clinical Narrative:     Patient will DC to: [facility] Anticipated DC date: 11/20/23  Family notified: daughter at bedside Transport by: private ambulance contacted by family   Per MD patient ready for DC to [facility]. RN to call report prior to discharge [phone], rm [phone]). RN, patient, patient's family, and facility notified of DC. Discharge Summary and FL2 sent to facility. DC packet on chart. Ambulance transport requested for patient.   CSW will sign off for now as social work intervention is no longer needed. Please consult us  again if new needs arise.    Final next level of care: Skilled Nursing Facility Barriers to Discharge: Barriers Resolved   Patient Goals and CMS Choice Patient states their goals for this hospitalization and ongoing recovery are:: Return home ASAP CMS Medicare.gov Compare Post Acute Care list provided to:: Patient Choice offered to / list presented to : Patient, Adult Children [place] ownership interest in [facility].provided to:: Patient    Discharge Placement              Patient chooses bed at: [facility] Patient to be transferred to facility by: Private ambulance Name of family member notified: Bonnie/daughter Patient and family notified of of transfer: 11/20/23  Discharge Plan and Services Additional resources added to the After Visit Summary for   In-house Referral: Clinical Social Work Discharge Planning Services: CM Consult Post Acute Care Choice: Skilled Nursing Facility                               Social Drivers of Health (SDOH) Interventions SDOH Screenings   Food Insecurity: No Food Insecurity (11/10/2023)  Housing: Low Risk  (11/10/2023)   Transportation Needs: No Transportation Needs (11/10/2023)  Utilities: Not At Risk (11/10/2023)  Alcohol Screen: Low Risk  (05/11/2023)  Depression (PHQ2-9): Low Risk  (05/11/2023)  Financial Resource Strain: Low Risk  (05/11/2023)  Physical Activity: Inactive (05/11/2023)  Social Connections: Moderately Isolated (11/10/2023)  Stress: No Stress Concern Present (05/11/2023)  Tobacco Use: Low Risk  (11/10/2023)  Health Literacy: Adequate Health Literacy (05/11/2023)     Readmission Risk Interventions     No data to display

## 2023-11-20 NOTE — Discharge Summary (Signed)
 Physician Discharge Summary  HOLLE SPRICK FMW:[id number] DOB: Apr 08, 1926 DOA: 11/09/2023  PCP: Geofm Glade PARAS, MD  Admit date: 11/09/2023 Discharge date: 11/20/2023  Admitted From: Home Disposition: Skilled nursing facility  Recommendations for Outpatient Follow-up:  Follow up with PCP in 1-2 weeks after discharge Follow-up with neurosurgery as needed  Home Health: N/A Equipment/Devices: N/A  Discharge Condition: Stable CODE STATUS: DNR/DNI Diet recommendation: Regular diet  Discharge summary: 89 year old from home, has history of myelodysplastic syndrome, chronic diastolic dysfunction, interstitial cystitis, chronic low back pain.  She had fallen at home with worsening back pain.  In the emergency room, hemodynamically stable.  Patient was found to have a L1 burst fracture.  Remained in the hospital for pain management.  Neurosurgery recommended conservative management.  Going to a SNF today.  Acute traumatic L1 burst fracture: Painful.  Seen by neurosurgery.  Nonsurgical candidate. TLSO brace when out of bed for 3 months, can remove at rest. Adequate pain medications.  Currently on OxyContin  10 mg twice daily, oxycodone  for breakthrough pain, Robaxin.  Patient also on a scheduled MiraLAX and Senokot to avoid constipation. She will continue to work with PT OT. Patient is not surgical candidate, however may follow-up with neurosurgery.  Chronic medical issues including Myelodysplastic syndrome, fairly stable.  Followed by oncology. Chronic diastolic dysfunction, fairly stable.  On torsemide . Constipation, as above.  Medically stable to transfer to skilled nursing facility today.    Discharge Diagnoses:  Principal Problem:   Closed L1 vertebral fracture (HCC) Active Problems:   Myelodysplasia (myelodysplastic syndrome) (HCC)   Chronic heart failure with preserved ejection fraction (HFpEF) [facility])    Discharge Instructions  Discharge Instructions     Diet general    Complete by: As directed    Increase activity slowly   Complete by: As directed       Allergies as of 11/20/2023       Reactions   Premarin [conjugated Estrogens] Other (See Comments)   TABS--  CAUSE PHLEBITIS   Vicodin [hydrocodone -acetaminophen ] Itching   SEVERE   Adhesive [tape] Other (See Comments)   IRRITATION   Fluvastatin Sodium Other (See Comments)   HEART PALPITATIONS   Simvastatin Other (See Comments)   SEVERE LEG ACHES        Medication List     STOP taking these medications    OVER THE COUNTER MEDICATION       TAKE these medications    acetaminophen  325 MG tablet Commonly known as: TYLENOL  Take 325 mg by mouth every 8 (eight) hours as needed for moderate pain.   B COMPLEX-B12 PO Take 1 tablet by mouth daily.   calcitonin (salmon) 200 UNIT/ACT nasal spray Commonly known as: MIACALCIN/FORTICAL Place 1 spray into alternate nostrils daily. Start taking on: November 21, 2023   IRON PO Take 1 tablet by mouth daily. Patient takes Mega-Food Blood Builder Iron Minis   lidocaine  5 % Commonly known as: LIDODERM  Place 2 patches onto the skin daily. Remove & Discard patch within 12 hours or as directed by MD Start taking on: November 21, 2023   Mag Aspart-Potassium Aspart 90-90 MG Caps Take 1 tablet by mouth daily.   methocarbamol 500 MG tablet Commonly known as: ROBAXIN Take 1 tablet (500 mg total) by mouth every 6 (six) hours as needed for up to 7 days for muscle spasms.   oxyCODONE  5 MG immediate release tablet Commonly known as: Oxy IR/ROXICODONE  Take 1 tablet (5 mg total) by mouth every 4 (four) hours as needed for  severe pain (pain score 7-10).   oxyCODONE  10 mg 12 hr tablet Commonly known as: OXYCONTIN  Take 1 tablet (10 mg total) by mouth every 12 (twelve) hours.   polyethylene glycol 17 g packet Commonly known as: MIRALAX / GLYCOLAX Take 17 g by mouth daily. Start taking on: November 21, 2023   PRESERVISION AREDS 2 PO Take 1 capsule by  mouth in the morning and at bedtime.   PREVAGEN PO Take 1 capsule by mouth daily.   senna-docusate 8.6-50 MG tablet Commonly known as: Senokot-S Take 1 tablet by mouth 2 (two) times daily.   SYSTANE OP Place 1 drop into both eyes daily. To both eyes.   torsemide  20 MG tablet Commonly known as: DEMADEX  TAKE 1 TABLET BY MOUTH EVERY DAY What changed: when to take this   Vitamin D3 25 MCG (1000 UT) Caps Take 4,000 Units by mouth daily.        Contact information for follow-up providers     Joshua Alm Hamilton, MD. Schedule an appointment as soon as possible for a visit in 2 week(s).   Specialty: Neurosurgery Why: call to schedule follow up Contact information: [address] [place] [address] [phone]              Contact information for after-discharge care     Destination     [facility] .   Service: Skilled Nursing Contact information: [address] [place]  [address] [phone]                    Allergies  Allergen Reactions   Premarin [Conjugated Estrogens] Other (See Comments)    TABS--  CAUSE PHLEBITIS   Vicodin [Hydrocodone -Acetaminophen ] Itching    SEVERE   Adhesive [Tape] Other (See Comments)    IRRITATION   Fluvastatin Sodium Other (See Comments)    HEART PALPITATIONS   Simvastatin Other (See Comments)    SEVERE LEG ACHES    Consultations: Neurosurgery   Procedures/Studies: DG Abd 1 View Result Date: 11/18/2023 EXAM: 1 VIEW XRAY OF THE ABDOMEN 11/18/2023 10:42:00 AM COMPARISON: 11/13/2023 CLINICAL HISTORY: Constipation FINDINGS: BOWEL: Nonobstructive bowel gas pattern. Moderate colonic stool burden diffusely throughout the colon. SOFT TISSUES: Pelvic phleboliths. Atherosclerotic calcifications. No opaque urinary calculi. BONES: No acute osseous abnormality. IMPRESSION: 1. Moderate colonic stool burden diffusely throughout the colon. Electronically signed  by: Norman Gatlin MD 11/18/2023 02:28 PM EDT RP Workstation: [id number]   DG Abd 1 View Result Date: 11/13/2023 EXAM: 1 View Xray Of The Abdomen 11/13/2023 10:36:17 Pm COMPARISON: None Available. CLINICAL HISTORY: Constipation [id number]. Abdominal Pain. No Bowel Movement In 2 To 3 Days FINDINGS: BOWEL: Nonobstructive Bowel Gas Pattern. Mild Colonic Stool Burden. SOFT TISSUES: Round Punctate Calcifications In The Pelvis Are Likely Phleboliths. BONES: No Acute Osseous Abnormality. VASCULATURE: Vascular Calcifications. IMPRESSION: 1. No bowel obstruction. 2. Mild colonic stool burden. Electronically signed by: Dorethia Molt MD 11/13/2023 10:42 PM EDT RP Workstation: [id number]   MR LUMBAR SPINE WO CONTRAST Result Date: 11/10/2023 EXAM: MRI LUMBAR SPINE 11/10/2023 12:48:00 AM TECHNIQUE: Multiplanar multisequence MRI of the lumbar spine was performed without the administration of intravenous contrast. COMPARISON: Lumbar spine CT 11/09/2023. CLINICAL HISTORY: Low back pain, cauda equina syndrome suspected. FINDINGS: BONES AND ALIGNMENT: There is an acute incomplete burst fracture of L1 with 20% height loss and 5 mm of retropulsion. According to the AO Spine classification of thoracolumbar injuries, the finding is consistent with an L1 A3 (incomplete burst fracture) injury.  The recommendation is to use the AO Spine thoracolumbar injury score (TL AOSIS) to guide management, with a score of 3 points for A3 morphology. Further management depends on neurological status and modifiers, but an A3 injury alone typically falls in the 0-3 point range, suggesting conservative treatment if no additional risk factors are present. There is bone marrow edema underlying the superior L1 endplate. SPINAL CORD: The conus terminates normally. SOFT TISSUES: No paraspinal mass. T12-L1: There is narrowing of the right lateral recess due to retropulsion from the L1 fracture. L1-L2: No significant disc herniation. No spinal canal stenosis or  neural foraminal narrowing. L2-L3: Small left asymmetric disc bulge. No central spinal canal or neural foraminal stenosis. L3-L4: No significant disc herniation. No spinal canal stenosis or neural foraminal narrowing. L4-L5: Small disc bulge. No central spinal canal or neural foraminal stenosis. L5-S1: No significant disc herniation. No spinal canal stenosis or neural foraminal narrowing. IMPRESSION: 1. Acute incomplete burst fracture of L1 with approximately 20% height loss and 5 mm retropulsion, consistent with an L1 A3 injury. 2. Narrowing of the right lateral recess at T12-L1 due to retropulsion from the L1 fracture. 3. Small left asymmetric disc bulge at L2-3 and small disc bulge at L4-L5. Electronically signed by: Franky Stanford MD 11/10/2023 01:29 AM EDT RP Workstation: [id number]   CT Cervical Spine Wo Contrast Result Date: 11/10/2023 EXAM: CT CERVICAL SPINE WITHOUT CONTRAST 11/10/2023 12:14:42 AM TECHNIQUE: CT of the cervical spine was performed without the administration of intravenous contrast. Multiplanar reformatted images are provided for review. Automated exposure control, iterative reconstruction, and/or weight based adjustment of the mA/kV was utilized to reduce the radiation dose to as low as reasonably achievable. COMPARISON: None available. CLINICAL HISTORY: Neck trauma (Age >= 65y). She reports she tripped when going down 2 steps and fell backwards and had been laying on the floor unable to get up for about 6 hours before her son found her. Denies head injury, no LOC, no ; blood thinners. She reports pain in her neck and coccyx. She arrives in a c-collar. She has bilateral leg weakness. FINDINGS: CERVICAL SPINE: BONES AND ALIGNMENT: No acute fracture or traumatic malalignment. DEGENERATIVE CHANGES: No significant degenerative changes. SOFT TISSUES: No prevertebral soft tissue swelling. LUNGS: Biapical pulmonary scarring. IMPRESSION: 1. No acute abnormality of the cervical spine. Electronically  signed by: Franky Stanford MD 11/10/2023 12:23 AM EDT RP Workstation: [id number]   DG HIP UNILAT WITH PELVIS 2-3 VIEWS LEFT Result Date: 11/09/2023 CLINICAL DATA:  Recent fall with left hip pain, initial encounter EXAM: DG HIP (WITH OR WITHOUT PELVIS) 3V LEFT COMPARISON:  None Available. FINDINGS: Pelvic ring is intact. No acute fracture or dislocation is noted. No soft tissue changes are seen. IMPRESSION: No acute abnormality noted. Electronically Signed   By: Oneil Devonshire M.D.   On: 11/09/2023 23:48   DG HIP UNILAT WITH PELVIS 2-3 VIEWS RIGHT Result Date: 11/09/2023 CLINICAL DATA:  Recent fall with hip pain, initial encounter EXAM: DG HIP (WITH OR WITHOUT PELVIS) 3V RIGHT COMPARISON:  None Available. FINDINGS: Pelvic ring is intact. No acute fracture or dislocation is noted. No soft tissue abnormality is seen. IMPRESSION: No acute abnormality noted. Electronically Signed   By: Oneil Devonshire M.D.   On: 11/09/2023 23:48   CT Lumbar Spine Wo Contrast Result Date: 11/09/2023 EXAM: CT OF THE LUMBAR SPINE WITHOUT CONTRAST 11/09/2023 11:19:53 PM TECHNIQUE: CT of the lumbar spine was performed without the administration of intravenous contrast. Multiplanar reformatted images are provided for review. Automated  exposure control, iterative reconstruction, and/or weight based adjustment of the mA/kV was utilized to reduce the radiation dose to as low as reasonably achievable. COMPARISON: None available. CLINICAL HISTORY: Back trauma, no prior imaging (Age >= 16y). Pt is from home, found by her son laying on garage floor. She reports she tripped when going down 2 steps and fell backwards and had been laying on the floor unable to get up for about 6 hours before her son found her. Denies head injury, no LOC, no blood thinners. She reports pain in her neck and coccyx. She arrives in a c-collar. She has bilateral leg weakness. + sensation. FINDINGS: BONES AND ALIGNMENT: Acute transverse fracture of the superior endplate  of L1. Minimal vertebral body height loss. 5 mm of retropulsion of the superior endplate. The retropulsion is eccentric to the right and causes effacement of the right lateral recess. Mild spinal canal narrowing secondary to the retropulsion. The spinal canal is otherwise widely patent. Normal vertebral body heights are maintained at other visualized lumbar levels. DEGENERATIVE CHANGES: No significant degenerative changes. SOFT TISSUES: No acute abnormality. IMPRESSION: 1. Acute transverse fracture of the L1 superior endplate with minimal height loss and 5 mm right-eccentric retropulsion, causing effacement of the right lateral recess and mild spinal canal narrowing. Electronically signed by: Norman Gatlin MD 11/09/2023 11:29 PM EDT RP Workstation: [id number]   CT Thoracic Spine Wo Contrast Result Date: 11/09/2023 EXAM: CT THORACIC SPINE WITHOUT CONTRAST 11/09/2023 11:19:53 PM TECHNIQUE: CT of the thoracic spine was performed without the administration of intravenous contrast. Multiplanar reformatted images are provided for review. Automated exposure control, iterative reconstruction, and/or weight based adjustment of the mA/kV was utilized to reduce the radiation dose to as low as reasonably achievable. COMPARISON: None available. CLINICAL HISTORY: Back trauma, no prior imaging (Age >= 18y). Pt is from home, found by her son laying on garage floor. She reports she tripped when going down 2 steps and fell backwards and had been laying on the floor unable to get up for about 6 hours before her son found her. Denies head injury, no LOC, no blood thinners. She reports pain in her neck and coccyx. She arrives in a c-collar. She has bilateral leg weakness. + sensation. FINDINGS: BONES AND ALIGNMENT: Normal vertebral body heights. No acute fracture in the thoracic spine. No suspicious bone lesion. Normal alignment. Lumbar spine reported separately. DEGENERATIVE CHANGES: Multilevel age related spondylosis. No severe  spinal canal narrowing. SOFT TISSUES: No acute abnormality. LUNGS: Biapical pleural parenchymal scarring in the lungs. IMPRESSION: 1. No acute fracture of the thoracic spine. 2. Multilevel age-related spondylosis without severe spinal canal narrowing. Electronically signed by: Norman Gatlin MD 11/09/2023 11:26 PM EDT RP Workstation: [id number]   CT Head Wo Contrast Result Date: 11/09/2023 CLINICAL DATA:  Recent slip and fall with headaches, initial encounter EXAM: CT HEAD WITHOUT CONTRAST TECHNIQUE: Contiguous axial images were obtained from the base of the skull through the vertex without intravenous contrast. RADIATION DOSE REDUCTION: This exam was performed according to the departmental dose-optimization program which includes automated exposure control, adjustment of the mA and/or kV according to patient size and/or use of iterative reconstruction technique. COMPARISON:  None Available. FINDINGS: Brain: No evidence of acute infarction, hemorrhage, hydrocephalus, extra-axial collection or mass lesion/mass effect. Cavum septum pellucidum is noted. Mild chronic white matter ischemic changes are noted. Vascular: No hyperdense vessel or unexpected calcification. Skull: Normal. Negative for fracture or focal lesion. Sinuses/Orbits: No acute finding. Other: None. IMPRESSION: Chronic ischemic changes without acute abnormality.  Electronically Signed   By: Oneil Devonshire M.D.   On: 11/09/2023 23:22   (Echo, Carotid, EGD, Colonoscopy, ERCP)    Subjective: Patient seen and examined.  She denies any complaints at rest.  She tells me that she is very painful to mobilize.  Daughter at the bedside.  Agreeable with the plan to transfer to [facility] health rehab today.   Discharge Exam: Vitals:   11/19/23 2123 11/20/23 0810  BP: (!) 136/56 (!) 135/59  Pulse: 77 77  Resp:    Temp: 97.8 F (36.6 C)   SpO2: 95% 98%   Vitals:   11/19/23 0747 11/19/23 1206 11/19/23 2123 11/20/23 0810  BP: (!) 119/41 (!) 130/47 (!)  136/56 (!) 135/59  Pulse: 93 85 77 77  Resp: 16 18    Temp: (!) 97.4 F (36.3 C) 98.3 F (36.8 C) 97.8 F (36.6 C)   TempSrc: Oral Oral    SpO2: 90% 96% 95% 98%  Weight:      Height:        General: Pt is alert, awake, not in acute distress.  Pleasant and interactive. Cardiovascular: RRR, S1/S2 +, no rubs, no gallops Respiratory: CTA bilaterally, no wheezing, no rhonchi Abdominal: Soft, NT, ND, bowel sounds + Extremities: no edema, no cyanosis    The results of significant diagnostics from this hospitalization (including imaging, microbiology, ancillary and laboratory) are listed below for reference.     Microbiology: No results found for this or any previous visit (from the past 240 hours).   Labs: BNP (last 3 results) No results for input(s): BNP in the last 8760 hours. Basic Metabolic Panel: No results for input(s): NA, K, CL, CO2, GLUCOSE, BUN, CREATININE, CALCIUM, MG, PHOS in the last 168 hours. Liver Function Tests: No results for input(s): AST, ALT, ALKPHOS, BILITOT, PROT, ALBUMIN in the last 168 hours. No results for input(s): LIPASE, AMYLASE in the last 168 hours. No results for input(s): AMMONIA in the last 168 hours. CBC: Recent Labs  Lab 11/15/23 0501 11/16/23 0437 11/17/23 0908  WBC 3.2* 4.4 3.4*  HGB 7.2* 7.4* 7.5*  HCT 21.8* 22.8* 22.8*  MCV 110.7* 110.7* 110.7*  PLT 233 285 276   Cardiac Enzymes: No results for input(s): CKTOTAL, CKMB, CKMBINDEX, TROPONINI in the last 168 hours. BNP: Invalid input(s): POCBNP CBG: No results for input(s): GLUCAP in the last 168 hours. D-Dimer No results for input(s): DDIMER in the last 72 hours. Hgb A1c No results for input(s): HGBA1C in the last 72 hours. Lipid Profile No results for input(s): CHOL, HDL, LDLCALC, TRIG, CHOLHDL, LDLDIRECT in the last 72 hours. Thyroid  function studies No results for input(s): TSH, T4TOTAL, T3FREE,  THYROIDAB in the last 72 hours.  Invalid input(s): FREET3 Anemia work up No results for input(s): VITAMINB12, FOLATE, FERRITIN, TIBC, IRON, RETICCTPCT in the last 72 hours. Urinalysis    Component Value Date/Time   COLORURINE YELLOW 11/19/2023 2155   APPEARANCEUR CLEAR 11/19/2023 2155   LABSPEC 1.008 11/19/2023 2155   PHURINE 8.0 11/19/2023 2155   GLUCOSEU NEGATIVE 11/19/2023 2155   HGBUR NEGATIVE 11/19/2023 2155   HGBUR moderate 10/09/2009 0923   BILIRUBINUR NEGATIVE 11/19/2023 2155   KETONESUR 5 (A) 11/19/2023 2155   PROTEINUR NEGATIVE 11/19/2023 2155   UROBILINOGEN 0.2 10/09/2009 0923   NITRITE NEGATIVE 11/19/2023 2155   LEUKOCYTESUR TRACE (A) 11/19/2023 2155   Sepsis Labs Recent Labs  Lab 11/15/23 0501 11/16/23 0437 11/17/23 0908  WBC 3.2* 4.4 3.4*   Microbiology No results found for this or  any previous visit (from the past 240 hours).   Time coordinating discharge: 32 minutes  SIGNED:   Renato Applebaum, MD  [facility] 11/20/2023, 11:20 AM

## 2023-11-23 ENCOUNTER — Encounter: Payer: Self-pay | Admitting: Radiology

## 2023-11-24 DIAGNOSIS — S32012A Unstable burst fracture of first lumbar vertebra, initial encounter for closed fracture: Secondary | ICD-10-CM | POA: Diagnosis not present

## 2023-11-24 DIAGNOSIS — D469 Myelodysplastic syndrome, unspecified: Secondary | ICD-10-CM | POA: Diagnosis not present

## 2023-11-24 DIAGNOSIS — E559 Vitamin D deficiency, unspecified: Secondary | ICD-10-CM | POA: Diagnosis not present

## 2023-11-24 DIAGNOSIS — Z4789 Encounter for other orthopedic aftercare: Secondary | ICD-10-CM | POA: Diagnosis not present

## 2023-11-24 DIAGNOSIS — I5032 Chronic diastolic (congestive) heart failure: Secondary | ICD-10-CM | POA: Diagnosis not present

## 2023-11-24 DIAGNOSIS — N3010 Interstitial cystitis (chronic) without hematuria: Secondary | ICD-10-CM | POA: Diagnosis not present

## 2023-11-24 DIAGNOSIS — K5900 Constipation, unspecified: Secondary | ICD-10-CM | POA: Diagnosis not present

## 2023-11-24 DIAGNOSIS — G8929 Other chronic pain: Secondary | ICD-10-CM | POA: Diagnosis not present

## 2023-11-26 DIAGNOSIS — M542 Cervicalgia: Secondary | ICD-10-CM | POA: Diagnosis not present

## 2023-11-26 DIAGNOSIS — M5459 Other low back pain: Secondary | ICD-10-CM | POA: Diagnosis not present

## 2023-11-26 DIAGNOSIS — D469 Myelodysplastic syndrome, unspecified: Secondary | ICD-10-CM | POA: Diagnosis not present

## 2023-11-26 DIAGNOSIS — E119 Type 2 diabetes mellitus without complications: Secondary | ICD-10-CM | POA: Diagnosis not present

## 2023-11-26 DIAGNOSIS — M4856XA Collapsed vertebra, not elsewhere classified, lumbar region, initial encounter for fracture: Secondary | ICD-10-CM | POA: Diagnosis not present

## 2023-11-26 DIAGNOSIS — I480 Paroxysmal atrial fibrillation: Secondary | ICD-10-CM | POA: Diagnosis not present

## 2023-11-26 DIAGNOSIS — I10 Essential (primary) hypertension: Secondary | ICD-10-CM | POA: Diagnosis not present

## 2023-11-26 DIAGNOSIS — K219 Gastro-esophageal reflux disease without esophagitis: Secondary | ICD-10-CM | POA: Diagnosis not present

## 2023-11-26 DIAGNOSIS — I83811 Varicose veins of right lower extremities with pain: Secondary | ICD-10-CM | POA: Diagnosis not present

## 2023-11-26 DIAGNOSIS — R251 Tremor, unspecified: Secondary | ICD-10-CM | POA: Diagnosis not present

## 2023-12-01 DIAGNOSIS — S32012A Unstable burst fracture of first lumbar vertebra, initial encounter for closed fracture: Secondary | ICD-10-CM | POA: Diagnosis not present

## 2023-12-01 DIAGNOSIS — I5032 Chronic diastolic (congestive) heart failure: Secondary | ICD-10-CM | POA: Diagnosis not present

## 2023-12-01 DIAGNOSIS — E559 Vitamin D deficiency, unspecified: Secondary | ICD-10-CM | POA: Diagnosis not present

## 2023-12-01 DIAGNOSIS — G8929 Other chronic pain: Secondary | ICD-10-CM | POA: Diagnosis not present

## 2023-12-01 DIAGNOSIS — D469 Myelodysplastic syndrome, unspecified: Secondary | ICD-10-CM | POA: Diagnosis not present

## 2023-12-03 DIAGNOSIS — S32011A Stable burst fracture of first lumbar vertebra, initial encounter for closed fracture: Secondary | ICD-10-CM | POA: Diagnosis not present

## 2023-12-03 DIAGNOSIS — S32010D Wedge compression fracture of first lumbar vertebra, subsequent encounter for fracture with routine healing: Secondary | ICD-10-CM | POA: Diagnosis not present

## 2023-12-03 DIAGNOSIS — G8929 Other chronic pain: Secondary | ICD-10-CM | POA: Diagnosis not present

## 2023-12-03 DIAGNOSIS — I5022 Chronic systolic (congestive) heart failure: Secondary | ICD-10-CM | POA: Diagnosis not present

## 2023-12-03 DIAGNOSIS — D469 Myelodysplastic syndrome, unspecified: Secondary | ICD-10-CM | POA: Diagnosis not present

## 2023-12-07 DIAGNOSIS — S32010G Wedge compression fracture of first lumbar vertebra, subsequent encounter for fracture with delayed healing: Secondary | ICD-10-CM | POA: Diagnosis not present

## 2023-12-07 DIAGNOSIS — I5022 Chronic systolic (congestive) heart failure: Secondary | ICD-10-CM | POA: Diagnosis not present

## 2023-12-07 DIAGNOSIS — I83811 Varicose veins of right lower extremities with pain: Secondary | ICD-10-CM | POA: Diagnosis not present

## 2023-12-08 ENCOUNTER — Telehealth: Payer: Self-pay

## 2023-12-08 DIAGNOSIS — E785 Hyperlipidemia, unspecified: Secondary | ICD-10-CM | POA: Diagnosis not present

## 2023-12-08 DIAGNOSIS — I5032 Chronic diastolic (congestive) heart failure: Secondary | ICD-10-CM | POA: Diagnosis not present

## 2023-12-08 DIAGNOSIS — I058 Other rheumatic mitral valve diseases: Secondary | ICD-10-CM | POA: Diagnosis not present

## 2023-12-08 DIAGNOSIS — I4510 Unspecified right bundle-branch block: Secondary | ICD-10-CM | POA: Diagnosis not present

## 2023-12-08 DIAGNOSIS — H9193 Unspecified hearing loss, bilateral: Secondary | ICD-10-CM | POA: Diagnosis not present

## 2023-12-08 DIAGNOSIS — I4891 Unspecified atrial fibrillation: Secondary | ICD-10-CM | POA: Diagnosis not present

## 2023-12-08 DIAGNOSIS — K5790 Diverticulosis of intestine, part unspecified, without perforation or abscess without bleeding: Secondary | ICD-10-CM | POA: Diagnosis not present

## 2023-12-08 DIAGNOSIS — M48061 Spinal stenosis, lumbar region without neurogenic claudication: Secondary | ICD-10-CM | POA: Diagnosis not present

## 2023-12-08 DIAGNOSIS — E119 Type 2 diabetes mellitus without complications: Secondary | ICD-10-CM | POA: Diagnosis not present

## 2023-12-08 DIAGNOSIS — Z90710 Acquired absence of both cervix and uterus: Secondary | ICD-10-CM | POA: Diagnosis not present

## 2023-12-08 DIAGNOSIS — E559 Vitamin D deficiency, unspecified: Secondary | ICD-10-CM | POA: Diagnosis not present

## 2023-12-08 DIAGNOSIS — M47814 Spondylosis without myelopathy or radiculopathy, thoracic region: Secondary | ICD-10-CM | POA: Diagnosis not present

## 2023-12-08 DIAGNOSIS — G8929 Other chronic pain: Secondary | ICD-10-CM | POA: Diagnosis not present

## 2023-12-08 DIAGNOSIS — K5909 Other constipation: Secondary | ICD-10-CM | POA: Diagnosis not present

## 2023-12-08 DIAGNOSIS — Z9089 Acquired absence of other organs: Secondary | ICD-10-CM | POA: Diagnosis not present

## 2023-12-08 DIAGNOSIS — H3530 Unspecified macular degeneration: Secondary | ICD-10-CM | POA: Diagnosis not present

## 2023-12-08 DIAGNOSIS — K219 Gastro-esophageal reflux disease without esophagitis: Secondary | ICD-10-CM | POA: Diagnosis not present

## 2023-12-08 DIAGNOSIS — I8391 Asymptomatic varicose veins of right lower extremity: Secondary | ICD-10-CM | POA: Diagnosis not present

## 2023-12-08 DIAGNOSIS — D469 Myelodysplastic syndrome, unspecified: Secondary | ICD-10-CM | POA: Diagnosis not present

## 2023-12-08 DIAGNOSIS — N3010 Interstitial cystitis (chronic) without hematuria: Secondary | ICD-10-CM | POA: Diagnosis not present

## 2023-12-08 DIAGNOSIS — Z79891 Long term (current) use of opiate analgesic: Secondary | ICD-10-CM | POA: Diagnosis not present

## 2023-12-08 DIAGNOSIS — M8580 Other specified disorders of bone density and structure, unspecified site: Secondary | ICD-10-CM | POA: Diagnosis not present

## 2023-12-08 DIAGNOSIS — I110 Hypertensive heart disease with heart failure: Secondary | ICD-10-CM | POA: Diagnosis not present

## 2023-12-08 NOTE — Telephone Encounter (Signed)
 Called patient to schedule with PCP. Left message for patient to call back

## 2023-12-09 NOTE — Transitions of Care (Post Inpatient/ED Visit) (Signed)
   12/09/2023  Name: Olivia Evans MRN: [id number] DOB: [birth year]  Today's TOC FU Call Status: Today's TOC FU Call Status:: Successful TOC FU Call Completed TOC FU Call Complete Date: 12/08/23  Patient's Name and Date of Birth confirmed. Name, DOB  Transition Care Management Follow-up Telephone Call Date of Discharge: 12/07/23 Discharge Facility: Other ([facility]) Name of Other ([facility]) Discharge Facility: [facility] Type of Discharge: Inpatient Admission Primary Inpatient Discharge Diagnosis:: fall How have you been since you were released from the hospital?: Better Any questions or concerns?: No  Items Reviewed:    Medications Reviewed Today: Medications Reviewed Today   Medications were not reviewed in this encounter     Home Care and Equipment/Supplies:    Functional Questionnaire:    Follow up appointments reviewed: Specialist Hospital Follow-up appointment confirmed?: Yes Date of Specialist follow-up appointment?: 12/31/23 Follow-Up Specialty Provider:: neuro    SIGNATURE Julian Lemmings, LPN [facility] Nurse Health Advisor Direct Dial [phone]

## 2023-12-10 ENCOUNTER — Encounter: Payer: Self-pay | Admitting: Internal Medicine

## 2023-12-10 NOTE — Progress Notes (Signed)
 Subjective:    Patient ID: Olivia Evans, female    DOB: 1927/01/05, 89 y.o.   MRN: [id number]     HPI Olivia Evans is here for follow up from the hospital.  She is here with her daughter.   Admitted 10/21 - 10/31  She fell at home and had lower back pain.  She missed the bottom step and fell backwards.  No LOC.  She was on the floor for several hours.  She stated increased urinary frequency.  No numbness, weakness.    ED - slightly elevated CK, hgb 8.9, ketonuria.  Imaging - incomplete burst fracture L1 with about 20% height loss and 5 mm retropulsion which causes narrowing of right lateral recess and mild canal narrowing.  Neurosurgery consulted and recommended conservative treatment - nonsurgical candidate.  She remained in the hospital for pain management.  TLSO brace when out of bed x 3 months.  On Oxycontin  10 mg bid, oxycodone  for breakthrough pain.  Also on miralax  and senokot.  PT/OT.    Chronic medical problems stable.    Discharged to SNF.  Discharged to home 11/14  Overall doing well.  Her pain is much better.  She currently is not taking anything for the pain-maybe Tylenol  as needed, but not regularly.  She has been taking Tylenol  PM at night to help her sleep which has helped.  Saw ortho a few days ago - fx a little worse - which is not uncommon.  Needs to continue to brace for full 3 months.    Has OT, PT, nurse coming to her home.  One of her kids has been with her 24/7.   Medications and allergies reviewed with patient and updated if appropriate.  Current Outpatient Medications on File Prior to Visit  Medication Sig Dispense Refill   acetaminophen  (TYLENOL ) 325 MG tablet Take 325 mg by mouth every 8 (eight) hours as needed for moderate pain.     Apoaequorin (PREVAGEN PO) Take 1 capsule by mouth daily.     B Complex Vitamins (B COMPLEX-B12 PO) Take 1 tablet by mouth daily.     calcitonin, salmon, (MIACALCIN /FORTICAL) 200 UNIT/ACT nasal spray Place 1 spray into  alternate nostrils daily.     Cholecalciferol (VITAMIN D3) 1000 UNITS CAPS Take 4,000 Units by mouth daily.     Ferrous Sulfate  (IRON PO) Take 1 tablet by mouth daily. Patient takes Mega-Food Blood Builder Iron Minis     lidocaine  (LIDODERM ) 5 % Place 2 patches onto the skin daily. Remove & Discard patch within 12 hours or as directed by MD 30 patch 0   Mag Aspart-Potassium Aspart 90-90 MG CAPS Take 1 tablet by mouth daily.     Multiple Vitamins-Minerals (PRESERVISION AREDS 2 PO) Take 1 capsule by mouth in the morning and at bedtime.     Polyethyl Glycol-Propyl Glycol (SYSTANE OP) Place 1 drop into both eyes daily. To both eyes.     polyethylene glycol (MIRALAX  / GLYCOLAX ) 17 g packet Take 17 g by mouth daily.     senna-docusate (SENOKOT-S) 8.6-50 MG tablet Take 1 tablet by mouth 2 (two) times daily.     torsemide  (DEMADEX ) 20 MG tablet TAKE 1 TABLET BY MOUTH EVERY DAY (Patient taking differently: Take 20 mg by mouth every other day.) 90 tablet 1   ferrous sulfate  (RA IRON) 325 (65 FE) MG tablet Take by mouth.     No current facility-administered medications on file prior to visit.     Review  of Systems  Constitutional:  Positive for appetite change (decreased). Negative for fever.  HENT:  Positive for postnasal drip.   Respiratory:  Positive for cough (from PND). Negative for shortness of breath and wheezing.   Cardiovascular:  Negative for chest pain, palpitations and leg swelling.  Gastrointestinal:  Positive for constipation. Negative for abdominal pain.  Musculoskeletal:  Positive for back pain.  Neurological:  Positive for headaches (occ, mild sinus HA). Negative for light-headedness.  Psychiatric/Behavioral:  Negative for dysphoric mood. The patient is not nervous/anxious.        Objective:   Vitals:   12/11/23 1339  BP: 110/68  Pulse: 79  Temp: 98 F (36.7 C)  SpO2: 98%   BP Readings from Last 3 Encounters:  12/11/23 110/68  11/20/23 (!) 135/59  08/31/23 (!) 159/63    Wt Readings from Last 3 Encounters:  12/11/23 120 lb (54.4 kg)  11/09/23 130 lb (59 kg)  08/31/23 131 lb (59.4 kg)   Body mass index is 20.6 kg/m.    Physical Exam Constitutional:      General: She is not in acute distress.    Appearance: Normal appearance.  HENT:     Head: Normocephalic and atraumatic.  Eyes:     Conjunctiva/sclera: Conjunctivae normal.  Cardiovascular:     Rate and Rhythm: Normal rate and regular rhythm.     Heart sounds: Murmur heard.  Pulmonary:     Effort: Pulmonary effort is normal. No respiratory distress.     Breath sounds: Rales (bibasilar dry crackles) present. No wheezing.  Musculoskeletal:     Cervical back: Neck supple.     Right lower leg: No edema.     Left lower leg: No edema.  Lymphadenopathy:     Cervical: No cervical adenopathy.  Skin:    General: Skin is warm and dry.     Findings: No rash.  Neurological:     Mental Status: She is alert. Mental status is at baseline.  Psychiatric:        Mood and Affect: Mood normal.        Behavior: Behavior normal.        Lab Results  Component Value Date   WBC 3.4 (L) 11/17/2023   HGB 7.5 (L) 11/17/2023   HCT 22.8 (L) 11/17/2023   PLT 276 11/17/2023   GLUCOSE 107 (H) 11/11/2023   CHOL 147 11/06/2021   TRIG 69.0 11/06/2021   HDL 67.50 11/06/2021   LDLDIRECT 104.5 04/01/2011   LDLCALC 66 11/06/2021   ALT 13 03/04/2023   AST 15 03/04/2023   NA 140 11/11/2023   K 3.9 11/11/2023   CL 105 11/11/2023   CREATININE 0.63 11/11/2023   BUN 10 11/11/2023   CO2 25 11/11/2023   TSH 1.08 11/11/2022   HGBA1C 5.8 11/11/2022     Assessment & Plan:    See Problem List for Assessment and Plan of chronic medical problems.

## 2023-12-10 NOTE — Patient Instructions (Addendum)
     Flu immunization administered today.        Medications changes include :   None      Return in about 6 months (around 06/09/2024) for follow up.

## 2023-12-11 ENCOUNTER — Ambulatory Visit: Admitting: Internal Medicine

## 2023-12-11 ENCOUNTER — Ambulatory Visit (INDEPENDENT_AMBULATORY_CARE_PROVIDER_SITE_OTHER): Admitting: Internal Medicine

## 2023-12-11 ENCOUNTER — Telehealth: Payer: Self-pay

## 2023-12-11 VITALS — BP 110/68 | HR 79 | Temp 98.0°F | Ht 64.0 in | Wt 120.0 lb

## 2023-12-11 DIAGNOSIS — D469 Myelodysplastic syndrome, unspecified: Secondary | ICD-10-CM

## 2023-12-11 DIAGNOSIS — Z23 Encounter for immunization: Secondary | ICD-10-CM | POA: Diagnosis not present

## 2023-12-11 DIAGNOSIS — I5032 Chronic diastolic (congestive) heart failure: Secondary | ICD-10-CM

## 2023-12-11 DIAGNOSIS — S32011A Stable burst fracture of first lumbar vertebra, initial encounter for closed fracture: Secondary | ICD-10-CM | POA: Diagnosis not present

## 2023-12-11 DIAGNOSIS — K5900 Constipation, unspecified: Secondary | ICD-10-CM | POA: Diagnosis not present

## 2023-12-11 DIAGNOSIS — M810 Age-related osteoporosis without current pathological fracture: Secondary | ICD-10-CM | POA: Diagnosis not present

## 2023-12-11 NOTE — Assessment & Plan Note (Signed)
 Chronic Has deferred treatment in the past Discussed that she is at risk for additional fractures and that we should consider medication to help prevent further fractures Currently on calcitonin nasal spray Discussed options for treatment of osteoporosis that we would start sometime next year-she will think about this

## 2023-12-11 NOTE — Assessment & Plan Note (Signed)
 Chronic Following with cardiology Euvolemic Continue torsemide  20 mg every other day

## 2023-12-11 NOTE — Assessment & Plan Note (Signed)
 Chronic Stressed that she needs to take something on a daily basis to prevent constipation Advised drinking a small amount of warm prune juice daily

## 2023-12-11 NOTE — Assessment & Plan Note (Signed)
 Chronic Stable Following with oncology

## 2023-12-11 NOTE — Assessment & Plan Note (Signed)
 New Sustained with fall at home on 10/21 Was in hospital, rehab and currently at home Pain is controlled with Tylenol  as needed Has seen neurosurgery-wearing TLSO brace Has family at home 24/7 Using walker to ambulate

## 2023-12-11 NOTE — Telephone Encounter (Signed)
 Okay for orders?

## 2023-12-11 NOTE — Telephone Encounter (Signed)
 Copied from CRM [id number]. Topic: Clinical - Home Health Verbal Orders >> Dec 11, 2023  1:32 PM Macario HERO wrote: Caller/Agency: [facility] Physical Therapist from [facility] Callback Number: [phone] (secured line/voice mail) Service Requested: Physical Therapy Frequency: 1 week 1, 1 every 2 week 2, 1 week 6 (strengthen and balancing with ambulation) Any new concerns about the patient? No

## 2023-12-14 DIAGNOSIS — S32012D Unstable burst fracture of first lumbar vertebra, subsequent encounter for fracture with routine healing: Secondary | ICD-10-CM | POA: Diagnosis not present

## 2023-12-14 DIAGNOSIS — I110 Hypertensive heart disease with heart failure: Secondary | ICD-10-CM | POA: Diagnosis not present

## 2023-12-14 DIAGNOSIS — D469 Myelodysplastic syndrome, unspecified: Secondary | ICD-10-CM | POA: Diagnosis not present

## 2023-12-14 DIAGNOSIS — K5909 Other constipation: Secondary | ICD-10-CM | POA: Diagnosis not present

## 2023-12-14 DIAGNOSIS — E119 Type 2 diabetes mellitus without complications: Secondary | ICD-10-CM | POA: Diagnosis not present

## 2023-12-14 DIAGNOSIS — I8391 Asymptomatic varicose veins of right lower extremity: Secondary | ICD-10-CM | POA: Diagnosis not present

## 2023-12-14 DIAGNOSIS — I4891 Unspecified atrial fibrillation: Secondary | ICD-10-CM | POA: Diagnosis not present

## 2023-12-14 DIAGNOSIS — M48061 Spinal stenosis, lumbar region without neurogenic claudication: Secondary | ICD-10-CM | POA: Diagnosis not present

## 2023-12-14 DIAGNOSIS — I5032 Chronic diastolic (congestive) heart failure: Secondary | ICD-10-CM | POA: Diagnosis not present

## 2023-12-14 DIAGNOSIS — G8929 Other chronic pain: Secondary | ICD-10-CM | POA: Diagnosis not present

## 2023-12-14 DIAGNOSIS — I058 Other rheumatic mitral valve diseases: Secondary | ICD-10-CM | POA: Diagnosis not present

## 2023-12-14 DIAGNOSIS — M47814 Spondylosis without myelopathy or radiculopathy, thoracic region: Secondary | ICD-10-CM | POA: Diagnosis not present

## 2023-12-14 NOTE — Telephone Encounter (Signed)
 Message left for verbals today

## 2023-12-15 ENCOUNTER — Telehealth: Payer: Self-pay

## 2023-12-15 NOTE — Telephone Encounter (Signed)
 Copied from CRM [id number]. Topic: Clinical - Home Health Verbal Orders >> Dec 15, 2023  1:36 PM Alexandria E wrote: Caller/Agency: [place], OT with [facility] Number: [phone] (secure line) Service Requested: Occupational Therapy Frequency: 1x/week for 4 weeks, and then once a week every other week for 2 visits. Any new concerns about the patient? No

## 2023-12-15 NOTE — Telephone Encounter (Signed)
 Verbals left today for [employer].

## 2023-12-31 DIAGNOSIS — S32011A Stable burst fracture of first lumbar vertebra, initial encounter for closed fracture: Secondary | ICD-10-CM | POA: Diagnosis not present

## 2024-01-08 NOTE — Telephone Encounter (Signed)
 Copied from CRM #[id number]. Topic: General - Other >> Jan 08, 2024  2:54 PM Robinson H wrote: Reason for CRM: [facility] calling to notify provider that patient was discharged early from Occupational Therapy   [facility] [phone]

## 2024-01-08 NOTE — Telephone Encounter (Signed)
 Noted

## 2024-01-25 ENCOUNTER — Encounter: Payer: Self-pay | Admitting: Family Medicine

## 2024-01-25 ENCOUNTER — Telehealth: Admitting: Family Medicine

## 2024-01-25 VITALS — BP 118/50 | HR 92 | Wt 121.0 lb

## 2024-01-25 DIAGNOSIS — J101 Influenza due to other identified influenza virus with other respiratory manifestations: Secondary | ICD-10-CM

## 2024-01-25 DIAGNOSIS — I5032 Chronic diastolic (congestive) heart failure: Secondary | ICD-10-CM | POA: Diagnosis not present

## 2024-01-25 MED ORDER — OSELTAMIVIR PHOSPHATE 75 MG PO CAPS: 75.0000 mg | ORAL_CAPSULE | Freq: Two times a day (BID) | ORAL | 0 refills | Status: AC

## 2024-01-25 MED ORDER — ALBUTEROL SULFATE HFA 108 (90 BASE) MCG/ACT IN AERS: 2.0000 | INHALATION_SPRAY | Freq: Four times a day (QID) | RESPIRATORY_TRACT | 0 refills | Status: AC | PRN

## 2024-01-27 ENCOUNTER — Ambulatory Visit: Payer: Self-pay

## 2024-01-27 NOTE — Telephone Encounter (Signed)
 FYI Only or Action Required?: Action required by provider: clinical question for provider and update on patient condition.  Patient was last seen in primary care on 01/25/2024 by Merlynn Niki FALCON, FNP.  Called Nurse Triage reporting Chest Pain.  Symptoms began yesterday.  Interventions attempted: OTC medications: cough drops and Prescription medications: tamiflu .  Symptoms are: unchanged.  Triage Disposition: Home Care  Patient/caregiver understands and will follow disposition?: Yes   Message from Suzen RAMAN sent at 01/27/2024  3:37 PM EST  Summary: Worsening symptoms   Reason for Triage: worsening symptoms after tele visit(01/25/24).. productive cough with mucous, and now has pain(under right arm more toward her side) possibly from excessive coughing would like to know if another medication can be call in b/c medication prescribe(oseltamivir  (TAMIFLU ) 75 MG capsule) hasn't resolved issue.    Would prefer callback at home number, patient daughter(Bonnie will let her sister know someone will be calling)  CB#[phone]          Reason for Disposition  Cough  Answer Assessment - Initial Assessment Questions Pt called in requesting alternative medication for cough as she has not had relief from Tamiflu ; pt states she did just start rx last night so she may not have given it time to be effective. Pt reports with coughing she has R rib pain, denies chest pain, SOB or fever. PT reports hx of pleurisy but denies symptoms; per schedule no appts with PCP but discussed with pt chest xray. Pt denies being seen at [facility]. Pt is using cough drops and otc nebulizer, encouraged her to increase fluids and add warm fluids with honey to aid in thinning secretions. Pt voiced understanding.    1. ONSET: When did the cough begin?      2 days ago   2. SEVERITY: How bad is the cough today?      Moderate; pt reports pain in R ribs with coughing   3. SPUTUM: Describe the color of your sputum  (e.g., none, dry cough; clear, white, yellow, green)     Thicken green   4. HEMOPTYSIS: Are you coughing up any blood? If Yes, ask: How much? (e.g., flecks, streaks, tablespoons, etc.)     No   5. DIFFICULTY BREATHING: Are you having difficulty breathing? If Yes, ask: How bad is it? (e.g., mild, moderate, severe)      No   6. FEVER: Do you have a fever? If Yes, ask: What is your temperature, how was it measured, and when did it start?     No   10. OTHER SYMPTOMS: Do you have any other symptoms? (e.g., runny nose, wheezing, chest pain)       Runny nose  Protocols used: Cough - Acute Productive-A-AH

## 2024-01-28 ENCOUNTER — Other Ambulatory Visit: Payer: Self-pay | Admitting: Internal Medicine

## 2024-01-28 MED ORDER — AMOXICILLIN 500 MG PO CAPS: 500.0000 mg | ORAL_CAPSULE | Freq: Three times a day (TID) | ORAL | 0 refills | Status: AC

## 2024-01-28 MED ORDER — AMOXICILLIN 500 MG PO CAPS: 500.0000 mg | ORAL_CAPSULE | Freq: Three times a day (TID) | ORAL | Status: DC

## 2024-01-28 NOTE — Telephone Encounter (Signed)
 The choice is for cough medicine or something with hydrocodone  which she is allergic to, Phenergan  which is an antinausea medication but could potentially cause some dizziness or confusion at her age or Scharlene Bailer which are not sedating but do not always work as well.  At her age any of these medications could potentially cause confusion.  This could just be the flu.  Based on her age there is always that slight chance of secondary bacterial pneumonia because of her age we can go ahead and treat for pneumonia-Will send in amoxicillin .  They should watch the cough closely and if that improving or worsening she needs to be reevaluated.  Let me know what they want for the cough.

## 2024-01-28 NOTE — Telephone Encounter (Signed)
 Noted

## 2024-01-30 NOTE — Progress Notes (Signed)
 "  Virtual Visit via Video Note  I connected with Olivia Evans on 01/30/2024 at  3:00 PM EST by a video enabled telemedicine application and verified that I am speaking with the correct person using two identifiers.  Patient Location: Home Provider Location: Office/Clinic  I discussed the limitations, risks, security, and privacy concerns of performing an evaluation and management service by video and the availability of in person appointments. I also discussed with the patient that there may be a patient responsible charge related to this service. The patient expressed understanding and agreed to proceed.  Subjective: PCP: Geofm Glade PARAS, MD  Chief Complaint  Patient presents with   Cough    Runny nose, ST, cough, congestion -dark green to brown, no fever, no aches, no GI upset   Started yesterday     Discussed the use of AI scribe software for clinical note transcription with the patient, who gave verbal consent to proceed.  Patient is accompanied by her daughter, who she is okay with being present.  Symptoms began yesterday with a sore throat early in the morning, progressing to hoarseness by the end of the day. She experiences a runny nose, cough, and congestion, with significant mucus production. She reports that the congestion is in both her head and chest. She has wheezing but no shortness of breath. Typically, colds affect her chest, throat, and mouth.  She has taken Vicks liquid medication for nighttime relief, which helped her rest until she began coughing and spitting up mucus again during the night. She has no history of asthma or COPD and does not smoke. She is not using any inhalers.     ROS: Per HPI Current Medications[1]  Observations/Objective: Today's Vitals   01/25/24 1440  BP: (!) 118/50  Pulse: 92  SpO2: 96%  Weight: 121 lb (54.9 kg)   Physical Exam Constitutional:      General: She is not in acute distress.    Appearance: Normal appearance. She is  not ill-appearing or toxic-appearing.  Eyes:     General: No scleral icterus.       Right eye: No discharge.        Left eye: No discharge.     Conjunctiva/sclera: Conjunctivae normal.  Pulmonary:     Effort: Pulmonary effort is normal. No respiratory distress.  Neurological:     Mental Status: She is alert and oriented to person, place, and time.  Psychiatric:        Mood and Affect: Mood normal.        Behavior: Behavior normal.        Thought Content: Thought content normal.        Judgment: Judgment normal.     Assessment & Plan Influenza A Acute viral respiratory symptoms likely due to influenza. Discussed Tamiflu  benefits and side effects. Shared decision-making on Tamiflu  form. Advised on symptom progression and improvement timeline. - Prescribed Tamiflu  capsule with pharmacy note for liquid substitution if needed. - Prescribed albuterol  inhaler for wheezing or chest tightness, two puffs every 4-6 hours as needed. - Recommended OTC medications for symptom relief: Tylenol  cold and flu, DayQuil, Nyquil, Mucinex cold and flu. - Advised humidifier use for respiratory symptoms. - Encouraged increased fluid intake. - Patient educated on the proper technique of the Albuterol  inhaler; patient verbalized understanding.  Orders:   albuterol  (VENTOLIN  HFA) 108 (90 Base) MCG/ACT inhaler; Inhale 2 puffs into the lungs every 6 (six) hours as needed.   oseltamivir  (TAMIFLU ) 75 MG capsule; Take 1  capsule (75 mg total) by mouth 2 (two) times daily for 5 days.  Chronic heart failure with preserved ejection fraction (HFpEF) (HCC) Discussed management in context of current illness. Considered withholding torsemide  to prevent dehydration. - Withhold torsemide  during acute illness to prevent dehydration. - Monitor for fluid overload and adjust torsemide  as needed.       Follow Up Instructions: Return if symptoms worsen or fail to improve.   I discussed the assessment and treatment plan  with the patient. The patient was provided an opportunity to ask questions, and all were answered. The patient agreed with the plan and demonstrated an understanding of the instructions.   The patient was advised to call back or seek an in-person evaluation if the symptoms worsen or if the condition fails to improve as anticipated.  The above assessment and management plan was discussed with the patient. The patient verbalized understanding of and has agreed to the management plan.   Niki JULIANNA Rung, FNP    [1]  Current Outpatient Medications:    acetaminophen  (TYLENOL ) 325 MG tablet, Take 325 mg by mouth every 8 (eight) hours as needed for moderate pain., Disp: , Rfl:    albuterol  (VENTOLIN  HFA) 108 (90 Base) MCG/ACT inhaler, Inhale 2 puffs into the lungs every 6 (six) hours as needed., Disp: 18 g, Rfl: 0   Apoaequorin (PREVAGEN PO), Take 1 capsule by mouth daily., Disp: , Rfl:    B Complex Vitamins (B COMPLEX-B12 PO), Take 1 tablet by mouth daily., Disp: , Rfl:    Cholecalciferol (VITAMIN D3) 1000 UNITS CAPS, Take 4,000 Units by mouth daily., Disp: , Rfl:    ferrous sulfate  (RA IRON) 325 (65 FE) MG tablet, Take by mouth., Disp: , Rfl:    Mag Aspart-Potassium Aspart 90-90 MG CAPS, Take 1 tablet by mouth daily., Disp: , Rfl:    Multiple Vitamins-Minerals (PRESERVISION AREDS 2 PO), Take 1 capsule by mouth in the morning and at bedtime., Disp: , Rfl:    oseltamivir  (TAMIFLU ) 75 MG capsule, Take 1 capsule (75 mg total) by mouth 2 (two) times daily for 5 days., Disp: 10 capsule, Rfl: 0   Polyethyl Glycol-Propyl Glycol (SYSTANE OP), Place 1 drop into both eyes daily. To both eyes., Disp: , Rfl:    polyethylene glycol (MIRALAX  / GLYCOLAX ) 17 g packet, Take 17 g by mouth daily., Disp: , Rfl:    senna-docusate (SENOKOT-S) 8.6-50 MG tablet, Take 1 tablet by mouth 2 (two) times daily., Disp: , Rfl:    torsemide  (DEMADEX ) 20 MG tablet, TAKE 1 TABLET BY MOUTH EVERY DAY (Patient taking differently: Take 20  mg by mouth every other day.), Disp: 90 tablet, Rfl: 1   amoxicillin  (AMOXIL ) 500 MG capsule, Take 1 capsule (500 mg total) by mouth 3 (three) times daily for 7 days., Disp: 21 capsule, Rfl: 0   calcitonin, salmon, (MIACALCIN /FORTICAL) 200 UNIT/ACT nasal spray, Place 1 spray into alternate nostrils daily. (Patient not taking: Reported on 01/25/2024), Disp: , Rfl:    lidocaine  (LIDODERM ) 5 %, Place 2 patches onto the skin daily. Remove & Discard patch within 12 hours or as directed by MD (Patient not taking: Reported on 01/25/2024), Disp: 30 patch, Rfl: 0  "

## 2024-01-30 NOTE — Assessment & Plan Note (Signed)
 Discussed management in context of current illness. Considered withholding torsemide  to prevent dehydration. - Withhold torsemide  during acute illness to prevent dehydration. - Monitor for fluid overload and adjust torsemide  as needed.

## 2024-03-02 ENCOUNTER — Inpatient Hospital Stay

## 2024-03-02 ENCOUNTER — Inpatient Hospital Stay: Admitting: Internal Medicine

## 2024-05-11 ENCOUNTER — Ambulatory Visit

## 2024-06-10 ENCOUNTER — Ambulatory Visit: Admitting: Internal Medicine
